# Patient Record
Sex: Male | Born: 1945 | Race: Asian | Hispanic: No | Smoking: Former smoker
Health system: Southern US, Community
[De-identification: ages and names within clinical notes are randomized; demographics above are authoritative.]

## PROBLEM LIST (undated history)

## (undated) DIAGNOSIS — I509 Heart failure, unspecified: Secondary | ICD-10-CM

## (undated) DIAGNOSIS — I10 Essential (primary) hypertension: Secondary | ICD-10-CM

## (undated) DIAGNOSIS — E039 Hypothyroidism, unspecified: Secondary | ICD-10-CM

## (undated) DIAGNOSIS — I5020 Unspecified systolic (congestive) heart failure: Secondary | ICD-10-CM

## (undated) DIAGNOSIS — E119 Type 2 diabetes mellitus without complications: Secondary | ICD-10-CM

## (undated) DIAGNOSIS — I4891 Unspecified atrial fibrillation: Secondary | ICD-10-CM

## (undated) DIAGNOSIS — K219 Gastro-esophageal reflux disease without esophagitis: Secondary | ICD-10-CM

## (undated) DIAGNOSIS — I2510 Atherosclerotic heart disease of native coronary artery without angina pectoris: Secondary | ICD-10-CM

## (undated) DIAGNOSIS — R0600 Dyspnea, unspecified: Secondary | ICD-10-CM

## (undated) DIAGNOSIS — E785 Hyperlipidemia, unspecified: Secondary | ICD-10-CM

## (undated) DIAGNOSIS — D509 Iron deficiency anemia, unspecified: Secondary | ICD-10-CM

## (undated) DIAGNOSIS — Z95810 Presence of automatic (implantable) cardiac defibrillator: Secondary | ICD-10-CM

## (undated) DIAGNOSIS — D696 Thrombocytopenia, unspecified: Secondary | ICD-10-CM

## (undated) DIAGNOSIS — N183 Chronic kidney disease, stage 3 unspecified: Secondary | ICD-10-CM

## (undated) DIAGNOSIS — I700 Atherosclerosis of aorta: Secondary | ICD-10-CM

## (undated) DIAGNOSIS — I255 Ischemic cardiomyopathy: Secondary | ICD-10-CM

## (undated) DIAGNOSIS — Z8673 Personal history of transient ischemic attack (TIA), and cerebral infarction without residual deficits: Secondary | ICD-10-CM

## (undated) DIAGNOSIS — Z7901 Long term (current) use of anticoagulants: Secondary | ICD-10-CM

## (undated) HISTORY — PX: CARDIAC DEFIBRILLATOR PLACEMENT: SHX171

## (undated) HISTORY — PX: CORONARY ARTERY BYPASS GRAFT: SHX141

## (undated) HISTORY — DX: Heart failure, unspecified: I50.9

---

## 2001-09-20 DIAGNOSIS — Z951 Presence of aortocoronary bypass graft: Secondary | ICD-10-CM

## 2001-09-20 DIAGNOSIS — Z8719 Personal history of other diseases of the digestive system: Secondary | ICD-10-CM

## 2001-09-20 HISTORY — PX: CORONARY ARTERY BYPASS GRAFT: SHX141

## 2001-09-20 HISTORY — DX: Presence of aortocoronary bypass graft: Z95.1

## 2002-09-20 DIAGNOSIS — I639 Cerebral infarction, unspecified: Secondary | ICD-10-CM

## 2002-09-20 HISTORY — DX: Cerebral infarction, unspecified: I63.9

## 2017-06-27 ENCOUNTER — Emergency Department: Payer: Medicare Other

## 2017-06-27 ENCOUNTER — Encounter: Payer: Self-pay | Admitting: *Deleted

## 2017-06-27 ENCOUNTER — Inpatient Hospital Stay
Admission: EM | Admit: 2017-06-27 | Discharge: 2017-07-06 | DRG: 308 | Disposition: A | Discharge status: Home Health | Payer: Medicare Other | Attending: Internal Medicine | Admitting: Internal Medicine

## 2017-06-27 DIAGNOSIS — F4321 Adjustment disorder with depressed mood: Secondary | ICD-10-CM

## 2017-06-27 DIAGNOSIS — I5021 Acute systolic (congestive) heart failure: Secondary | ICD-10-CM

## 2017-06-27 DIAGNOSIS — I5023 Acute on chronic systolic (congestive) heart failure: Secondary | ICD-10-CM | POA: Diagnosis present

## 2017-06-27 DIAGNOSIS — J9601 Acute respiratory failure with hypoxia: Secondary | ICD-10-CM | POA: Diagnosis not present

## 2017-06-27 DIAGNOSIS — F4320 Adjustment disorder, unspecified: Secondary | ICD-10-CM | POA: Diagnosis present

## 2017-06-27 DIAGNOSIS — Z8673 Personal history of transient ischemic attack (TIA), and cerebral infarction without residual deficits: Secondary | ICD-10-CM

## 2017-06-27 DIAGNOSIS — I110 Hypertensive heart disease with heart failure: Secondary | ICD-10-CM | POA: Diagnosis present

## 2017-06-27 DIAGNOSIS — R42 Dizziness and giddiness: Secondary | ICD-10-CM | POA: Diagnosis not present

## 2017-06-27 DIAGNOSIS — J9600 Acute respiratory failure, unspecified whether with hypoxia or hypercapnia: Secondary | ICD-10-CM

## 2017-06-27 DIAGNOSIS — R7989 Other specified abnormal findings of blood chemistry: Secondary | ICD-10-CM

## 2017-06-27 DIAGNOSIS — I10 Essential (primary) hypertension: Secondary | ICD-10-CM | POA: Diagnosis present

## 2017-06-27 DIAGNOSIS — I2510 Atherosclerotic heart disease of native coronary artery without angina pectoris: Secondary | ICD-10-CM | POA: Diagnosis present

## 2017-06-27 DIAGNOSIS — Z951 Presence of aortocoronary bypass graft: Secondary | ICD-10-CM

## 2017-06-27 DIAGNOSIS — Z452 Encounter for adjustment and management of vascular access device: Secondary | ICD-10-CM

## 2017-06-27 DIAGNOSIS — R05 Cough: Secondary | ICD-10-CM

## 2017-06-27 DIAGNOSIS — I4892 Unspecified atrial flutter: Secondary | ICD-10-CM | POA: Diagnosis present

## 2017-06-27 DIAGNOSIS — R945 Abnormal results of liver function studies: Secondary | ICD-10-CM | POA: Diagnosis present

## 2017-06-27 DIAGNOSIS — G9341 Metabolic encephalopathy: Secondary | ICD-10-CM | POA: Diagnosis not present

## 2017-06-27 DIAGNOSIS — I4891 Unspecified atrial fibrillation: Secondary | ICD-10-CM

## 2017-06-27 DIAGNOSIS — Z4659 Encounter for fitting and adjustment of other gastrointestinal appliance and device: Secondary | ICD-10-CM

## 2017-06-27 DIAGNOSIS — Z95810 Presence of automatic (implantable) cardiac defibrillator: Secondary | ICD-10-CM

## 2017-06-27 DIAGNOSIS — R443 Hallucinations, unspecified: Secondary | ICD-10-CM | POA: Diagnosis not present

## 2017-06-27 DIAGNOSIS — R059 Cough, unspecified: Secondary | ICD-10-CM

## 2017-06-27 DIAGNOSIS — R451 Restlessness and agitation: Secondary | ICD-10-CM | POA: Diagnosis not present

## 2017-06-27 DIAGNOSIS — I481 Persistent atrial fibrillation: Secondary | ICD-10-CM | POA: Diagnosis not present

## 2017-06-27 DIAGNOSIS — I480 Paroxysmal atrial fibrillation: Secondary | ICD-10-CM | POA: Diagnosis present

## 2017-06-27 DIAGNOSIS — Z7982 Long term (current) use of aspirin: Secondary | ICD-10-CM

## 2017-06-27 DIAGNOSIS — H539 Unspecified visual disturbance: Secondary | ICD-10-CM | POA: Diagnosis not present

## 2017-06-27 DIAGNOSIS — I255 Ischemic cardiomyopathy: Secondary | ICD-10-CM | POA: Diagnosis present

## 2017-06-27 DIAGNOSIS — R0602 Shortness of breath: Secondary | ICD-10-CM

## 2017-06-27 DIAGNOSIS — Z79899 Other long term (current) drug therapy: Secondary | ICD-10-CM

## 2017-06-27 DIAGNOSIS — E119 Type 2 diabetes mellitus without complications: Secondary | ICD-10-CM | POA: Diagnosis present

## 2017-06-27 DIAGNOSIS — I252 Old myocardial infarction: Secondary | ICD-10-CM

## 2017-06-27 DIAGNOSIS — Z7984 Long term (current) use of oral hypoglycemic drugs: Secondary | ICD-10-CM

## 2017-06-27 HISTORY — DX: Personal history of transient ischemic attack (TIA), and cerebral infarction without residual deficits: Z86.73

## 2017-06-27 HISTORY — DX: Essential (primary) hypertension: I10

## 2017-06-27 HISTORY — DX: Atherosclerotic heart disease of native coronary artery without angina pectoris: I25.10

## 2017-06-27 HISTORY — DX: Type 2 diabetes mellitus without complications: E11.9

## 2017-06-27 LAB — BASIC METABOLIC PANEL
Anion gap: 9 (ref 5–15)
BUN: 20 mg/dL (ref 6–20)
CALCIUM: 9.2 mg/dL (ref 8.9–10.3)
CHLORIDE: 103 mmol/L (ref 101–111)
CO2: 27 mmol/L (ref 22–32)
CREATININE: 1.11 mg/dL (ref 0.61–1.24)
GFR calc Af Amer: >60 mL/min (ref >60)
GFR calc non Af Amer: >60 mL/min (ref >60)
GLUCOSE: 213 mg/dL — AB (ref 65–99)
Potassium: 4.5 mmol/L (ref 3.5–5.1)
Sodium: 139 mmol/L (ref 135–145)

## 2017-06-27 LAB — CBC
HCT: 35.2 % — ABNORMAL LOW (ref 40.0–52.0)
Hemoglobin: 11.7 g/dL — ABNORMAL LOW (ref 13.0–18.0)
MCH: 28.4 pg (ref 26.0–34.0)
MCHC: 33.3 g/dL (ref 32.0–36.0)
MCV: 85.4 fL (ref 80.0–100.0)
PLATELETS: 142 10*3/uL — AB (ref 150–440)
RBC: 4.12 MIL/uL — ABNORMAL LOW (ref 4.40–5.90)
RDW: 15.3 % — ABNORMAL HIGH (ref 11.5–14.5)
WBC: 5.3 10*3/uL (ref 3.8–10.6)

## 2017-06-27 LAB — TROPONIN I

## 2017-06-27 MED ORDER — METOPROLOL TARTRATE 5 MG/5ML IV SOLN
2.5000 mg | Freq: Once | INTRAVENOUS | Status: AC
  Administered 2017-06-27: 2.5 mg via INTRAVENOUS
  Filled 2017-06-27: qty 5

## 2017-06-27 NOTE — ED Triage Notes (Signed)
Pt to triage via wheelchair.  Pt reports dizziness for 1 day.  No chest pain or sob.  No n//vd   No headache. Pt alert.  Speech clear.

## 2017-06-27 NOTE — ED Provider Notes (Signed)
[facility] Emergency Department Provider Note   ____________________________________________   First MD Initiated Contact with Patient 06/27/17 2306     (approximate)  I have reviewed the triage vital signs and the nursing notes.   HISTORY  Chief Complaint Dizziness    HPI Walter Blair is a 71 y.o. male who presents to the ED from home with a chief complaint of dizziness. Patient has a history of CAD status post CABG, diabetes who has been dizzy since around dinner time. Unable to describe whether it is a sensation of lightheadedness versus vertigo. Denies associated fever, chills, chest pain, shortness of breath, abdominal pain, nausea, vomiting. Denies recent travel or trauma. Takes Coreg and states it was decreased by half dosage in July secondary to patient experiencing dizziness.Nothing makes his dizziness better or worse.   Past medical history CAD Diabetes Hypertension Hyperlipidemia  There are no active problems to display for this patient.   Past surgical history AICD  Prior to Admission medications   Medication Sig Start Date End Date Taking? Authorizing Provider  atorvastatin (LIPITOR) 80 MG tablet Take 80 mg by mouth daily.   Yes [provider]  carvedilol (COREG) 3.125 MG tablet Take 3.125 mg by mouth 2 (two) times daily.   Yes [provider]  folic acid (FOLVITE) 1 MG tablet Take 1 mg by mouth every morning.   Yes [provider]  furosemide (LASIX) 20 MG tablet Take 20 mg by mouth daily.   Yes [provider]  lisinopril (PRINIVIL,ZESTRIL) 5 MG tablet Take 5 mg by mouth every morning.   Yes [provider]  metFORMIN (GLUCOPHAGE-XR) 500 MG 24 hr tablet Take 500-1,000 mg by mouth 2 (two) times daily. 500mg  in the morning and 1000mg  at night   Yes [provider]  RA ASPIRIN EC ADULT LOW ST 81 MG EC tablet Take 81 mg by mouth daily.   Yes [provider]     Allergies Patient has no known allergies.  No family history on file.  Social History Social History  Substance Use Topics  . Smoking status: Never Smoker  . Smokeless tobacco: Never Used  . Alcohol use No    Review of Systems  Constitutional: No fever/chills. Eyes: No visual changes. ENT: No sore throat. Cardiovascular: Denies chest pain. Respiratory: Denies shortness of breath. Gastrointestinal: No abdominal pain.  No nausea, no vomiting.  No diarrhea.  No constipation. Genitourinary: Negative for dysuria. Musculoskeletal: Negative for back pain. Skin: Negative for rash. Neurological: Positive for dizziness. Negative for headaches, focal weakness or numbness.   ____________________________________________   PHYSICAL EXAM:  VITAL SIGNS: ED Triage Vitals [06/27/17 2149]  Enc Vitals Group     BP 125/74     Pulse Rate 96     Resp 18     Temp 98.2 F (36.8 C)     Temp Source Oral     SpO2 96 %     Weight 125 lb (56.7 kg)     Height 5\' 5"  (1.651 m)     Head Circumference      Peak Flow      Pain Score      Pain Loc      Pain Edu?      Excl. in [place]?     Constitutional: Alert and oriented. Well appearing and in no acute distress. Eyes: Conjunctivae are normal. PERRL. EOMI. Head: Atraumatic. Nose: No congestion/rhinnorhea. Mouth/Throat: Mucous membranes are moist.  Oropharynx non-erythematous. Neck: No stridor.  No carotid bruits. Cardiovascular: Tachycardic rate, irregular rhythm. Grossly normal heart sounds.  Good peripheral circulation. Respiratory: Normal respiratory effort.  No retractions. Lungs CTAB. Gastrointestinal: Soft and nontender. No distention. No abdominal bruits. No CVA tenderness. Musculoskeletal: No lower extremity tenderness nor edema.  No joint effusions. Neurologic:  Normal speech and language. No gross focal neurologic deficits are appreciated.  Skin:  Skin is warm, dry and intact. No rash noted. Psychiatric: Mood and affect are  normal. Speech and behavior are normal.  ____________________________________________   LABS (all labs ordered are listed, but only abnormal results are displayed)  Labs Reviewed  BASIC METABOLIC PANEL - Abnormal; Notable for the following:       Result Value   Glucose, Bld 213 (*)    All other components within normal limits  CBC - Abnormal; Notable for the following:    RBC 4.12 (*)    Hemoglobin 11.7 (*)    HCT 35.2 (*)    RDW 15.3 (*)    Platelets 142 (*)    All other components within normal limits  TROPONIN I   ____________________________________________  EKG  ED ECG REPORT I, SUNG,JADE J, the attending physician, personally viewed and interpreted this ECG.   Date: 06/27/2017  EKG Time: 2156  Rate: 112  Rhythm: atrial fibrillation, rate 112  Axis: RAD  Intervals:none  ST&T Change: Nonspecific  ____________________________________________  RADIOLOGY  Dg Chest 2 View  Result Date: 06/27/2017 CLINICAL DATA:  Dizziness for 1 day. EXAM: CHEST  2 VIEW COMPARISON:  None. FINDINGS: Heart size is mildly enlarged. Evidence of prior CABG procedure. There is a left cardiac ICD with a lead in the right ventricle. The lungs are clear without airspace disease or pulmonary edema. No large pleural effusions. Bony thorax is intact. IMPRESSION: No active cardiopulmonary disease. Electronically Signed   By: Markus Daft M.D.   On: 06/27/2017 22:16    ____________________________________________   PROCEDURES  Procedure(s) performed: None  Procedures  Critical Care performed: No  ____________________________________________   INITIAL IMPRESSION / ASSESSMENT AND PLAN / ED COURSE  As part of my medical decision making, I reviewed the following data within the [place] History obtained from family, Nursing notes reviewed and incorporated, Labs reviewed, EKG interpreted, Radiograph reviewed, Discussed with admitting physician, Evaluated by EM attending and  Notes from prior ED visits.   71 year old male with CAD, diabetes, hypertension, hyperlipidemia who presents with dizziness. Differential diagnoses includes but is not limited to neurological, infectious, cardiac etiologies. Presents in atrial fibrillation with rapid ventricular rate, no prior history of atrial fibrillation. Initial troponin is unremarkable; will administer low-dose Lopressor, check orthostatics, obtain CT head to evaluate for intracranial abnormalities. Discussed with hospitalist evaluate patient in the emergency department for admission.      ____________________________________________   FINAL CLINICAL IMPRESSION(S) / ED DIAGNOSES  Final diagnoses:  Dizziness  New onset atrial fibrillation (HCC)      NEW MEDICATIONS STARTED DURING THIS VISIT:  New Prescriptions   No medications on file     Note:  This document was prepared using Dragon voice recognition software and may include unintentional dictation errors.    Paulette Blanch, MD 06/28/17 [phone]

## 2017-06-27 NOTE — H&P (Signed)
[facility] NAME: Walter Blair    MR#:  [id number]  DATE OF BIRTH:  18-May-1946  DATE OF ADMISSION:  06/27/2017  PRIMARY CARE PHYSICIAN: Jodi Marble, MD   REQUESTING/REFERRING PHYSICIAN: Beather Arbour, MD  CHIEF COMPLAINT:   Chief Complaint  Patient presents with  . Dizziness    HISTORY OF PRESENT ILLNESS:  Walter Blair  is a 71 y.o. male who presents with Recurring episodes of dizziness today. Patient states that he would get dizzy when he would stand up. This happened several times and so he came to the ED for evaluation. Here he was noted to be in A. fib with RVR. Patient states he has never before been diagnosed with any arrhythmia. Hospitalists were called for admission.  PAST MEDICAL HISTORY:   Past Medical History:  Diagnosis Date  . CAD (coronary artery disease)   . Diabetes ([place])   . H/O: stroke   . HTN (hypertension)     PAST SURGICAL HISTORY:   Past Surgical History:  Procedure Laterality Date  . CARDIAC DEFIBRILLATOR PLACEMENT    . CORONARY ARTERY BYPASS GRAFT      SOCIAL HISTORY:   Social History  Substance Use Topics  . Smoking status: Never Smoker  . Smokeless tobacco: Never Used  . Alcohol use No    FAMILY HISTORY:   Family History  Problem Relation Age of Onset  . Diabetes Brother   . Hypertension Mother   . Diabetes Sister     DRUG ALLERGIES:  No Known Allergies  MEDICATIONS AT HOME:   Prior to Admission medications   Medication Sig Start Date End Date Taking? Authorizing Provider  atorvastatin (LIPITOR) 80 MG tablet Take 80 mg by mouth daily.   Yes [provider]  carvedilol (COREG) 3.125 MG tablet Take 3.125 mg by mouth 2 (two) times daily.   Yes [provider]  folic acid (FOLVITE) 1 MG tablet Take 1 mg by mouth every morning.   Yes [provider]  furosemide (LASIX) 20 MG tablet Take 20 mg by mouth daily.   Yes [provider]   lisinopril (PRINIVIL,ZESTRIL) 5 MG tablet Take 5 mg by mouth every morning.   Yes [provider]  metFORMIN (GLUCOPHAGE-XR) 500 MG 24 hr tablet Take 500-1,000 mg by mouth 2 (two) times daily. 500mg  in the morning and 1000mg  at night   Yes [provider]  RA ASPIRIN EC ADULT LOW ST 81 MG EC tablet Take 81 mg by mouth daily.   Yes [provider]    REVIEW OF SYSTEMS:  Review of Systems  Constitutional: Negative for chills, fever, malaise/fatigue and weight loss.  HENT: Negative for ear pain, hearing loss and tinnitus.   Eyes: Negative for blurred vision, double vision, pain and redness.  Respiratory: Negative for cough, hemoptysis and shortness of breath.   Cardiovascular: Negative for chest pain, palpitations, orthopnea and leg swelling.  Gastrointestinal: Negative for abdominal pain, constipation, diarrhea, nausea and vomiting.  Genitourinary: Negative for dysuria, frequency and hematuria.  Musculoskeletal: Negative for back pain, joint pain and neck pain.  Skin:       No acne, rash, or lesions  Neurological: Positive for dizziness. Negative for tremors, focal weakness and weakness.  Endo/Heme/Allergies: Negative for polydipsia. Does not bruise/bleed easily.  Psychiatric/Behavioral: Negative for depression. The patient is not nervous/anxious and does not have insomnia.      VITAL SIGNS:   Vitals:   06/27/17 2149 06/27/17  2350  BP: 125/74 (!) 140/95  Pulse: 96 (!) 107  Resp: 18 15  Temp: 98.2 F (36.8 C)   TempSrc: Oral   SpO2: 96% 96%  Weight: 56.7 kg (125 lb)   Height: 5\' 5"  (1.651 m)    Wt Readings from Last 3 Encounters:  06/27/17 56.7 kg (125 lb)    PHYSICAL EXAMINATION:  Physical Exam  Vitals reviewed. Constitutional: He is oriented to person, place, and time. He appears well-developed and well-nourished. No distress.  HENT:  Head: Normocephalic and atraumatic.  Mouth/Throat: Oropharynx is clear and moist.  Eyes: Pupils are equal,  round, and reactive to light. Conjunctivae and EOM are normal. No scleral icterus.  Neck: Normal range of motion. Neck supple. No JVD present. No thyromegaly present.  Cardiovascular: Intact distal pulses.  Exam reveals no gallop and no friction rub.   No murmur heard. Irregular rhythm, tachycardic  Respiratory: Effort normal and breath sounds normal. No respiratory distress. He has no wheezes. He has no rales.  GI: Soft. Bowel sounds are normal. He exhibits no distension. There is no tenderness.  Musculoskeletal: Normal range of motion. He exhibits no edema.  No arthritis, no gout  Lymphadenopathy:    He has no cervical adenopathy.  Neurological: He is alert and oriented to person, place, and time. No cranial nerve deficit.  No dysarthria, no aphasia  Skin: Skin is warm and dry. No rash noted. No erythema.  Psychiatric: He has a normal mood and affect. His behavior is normal. Judgment and thought content normal.    LABORATORY PANEL:   CBC  Recent Labs Lab 06/27/17 2152  WBC 5.3  HGB 11.7*  HCT 35.2*  PLT 142*   ------------------------------------------------------------------------------------------------------------------  Chemistries   Recent Labs Lab 06/27/17 2152  NA 139  K 4.5  CL 103  CO2 27  GLUCOSE 213*  BUN 20  CREATININE 1.11  CALCIUM 9.2   ------------------------------------------------------------------------------------------------------------------  Cardiac Enzymes  Recent Labs Lab 06/27/17 2152  TROPONINI <0.03   ------------------------------------------------------------------------------------------------------------------  RADIOLOGY:  Dg Chest 2 View  Result Date: 06/27/2017 CLINICAL DATA:  Dizziness for 1 day. EXAM: CHEST  2 VIEW COMPARISON:  None. FINDINGS: Heart size is mildly enlarged. Evidence of prior CABG procedure. There is a left cardiac ICD with a lead in the right ventricle. The lungs are clear without airspace disease or  pulmonary edema. No large pleural effusions. Bony thorax is intact. IMPRESSION: No active cardiopulmonary disease. Electronically Signed   By: Markus Daft M.D.   On: 06/27/2017 22:16    EKG:   Orders placed or performed during the hospital encounter of 06/27/17  . ED EKG within 10 minutes  . ED EKG within 10 minutes    IMPRESSION AND PLAN:  Principal Problem:   Atrial fibrillation with RVR (HCC) - IV Lopressor for rate control, echocardiogram and cardiology consult Active Problems:   Diabetes ([place]) - sliding scale insulin with corresponding glucose checks   HTN (hypertension) - continue home meds   CAD (coronary artery disease) - continue home medications  All the records are reviewed and case discussed with ED provider. Management plans discussed with the patient and/or family.  DVT PROPHYLAXIS: SubQ lovenox  GI PROPHYLAXIS: None  ADMISSION STATUS: Inpatient  CODE STATUS: Full Code Status History    This patient does not have a recorded code status. Please follow your organizational policy for patients in this situation.      TOTAL TIME TAKING CARE OF THIS PATIENT: 40 minutes.   Juniel Groene  FIELDING 06/27/2017, 11:57 PM  [facility]  Office  [phone]  CC: Primary care physician; Jodi Marble, MD  Note:  This document was prepared using Dragon voice recognition software and may include unintentional dictation errors.

## 2017-06-28 ENCOUNTER — Observation Stay: Payer: Medicare Other

## 2017-06-28 ENCOUNTER — Inpatient Hospital Stay: Admit: 2017-06-28 | Payer: Medicare Other

## 2017-06-28 DIAGNOSIS — I255 Ischemic cardiomyopathy: Secondary | ICD-10-CM | POA: Diagnosis present

## 2017-06-28 DIAGNOSIS — G9341 Metabolic encephalopathy: Secondary | ICD-10-CM | POA: Diagnosis not present

## 2017-06-28 DIAGNOSIS — I481 Persistent atrial fibrillation: Secondary | ICD-10-CM | POA: Diagnosis present

## 2017-06-28 DIAGNOSIS — Z452 Encounter for adjustment and management of vascular access device: Secondary | ICD-10-CM | POA: Diagnosis not present

## 2017-06-28 DIAGNOSIS — I5023 Acute on chronic systolic (congestive) heart failure: Secondary | ICD-10-CM | POA: Diagnosis present

## 2017-06-28 DIAGNOSIS — I110 Hypertensive heart disease with heart failure: Secondary | ICD-10-CM | POA: Diagnosis present

## 2017-06-28 DIAGNOSIS — I4891 Unspecified atrial fibrillation: Secondary | ICD-10-CM | POA: Diagnosis not present

## 2017-06-28 DIAGNOSIS — F4321 Adjustment disorder with depressed mood: Secondary | ICD-10-CM | POA: Diagnosis not present

## 2017-06-28 DIAGNOSIS — I252 Old myocardial infarction: Secondary | ICD-10-CM | POA: Diagnosis not present

## 2017-06-28 DIAGNOSIS — Z7984 Long term (current) use of oral hypoglycemic drugs: Secondary | ICD-10-CM | POA: Diagnosis not present

## 2017-06-28 DIAGNOSIS — Z951 Presence of aortocoronary bypass graft: Secondary | ICD-10-CM | POA: Diagnosis not present

## 2017-06-28 DIAGNOSIS — R443 Hallucinations, unspecified: Secondary | ICD-10-CM | POA: Diagnosis not present

## 2017-06-28 DIAGNOSIS — R42 Dizziness and giddiness: Secondary | ICD-10-CM | POA: Diagnosis present

## 2017-06-28 DIAGNOSIS — Z79899 Other long term (current) drug therapy: Secondary | ICD-10-CM | POA: Diagnosis not present

## 2017-06-28 DIAGNOSIS — E119 Type 2 diabetes mellitus without complications: Secondary | ICD-10-CM | POA: Diagnosis present

## 2017-06-28 DIAGNOSIS — Z8673 Personal history of transient ischemic attack (TIA), and cerebral infarction without residual deficits: Secondary | ICD-10-CM | POA: Diagnosis not present

## 2017-06-28 DIAGNOSIS — F4320 Adjustment disorder, unspecified: Secondary | ICD-10-CM | POA: Diagnosis present

## 2017-06-28 DIAGNOSIS — R945 Abnormal results of liver function studies: Secondary | ICD-10-CM | POA: Diagnosis present

## 2017-06-28 DIAGNOSIS — J9601 Acute respiratory failure with hypoxia: Secondary | ICD-10-CM | POA: Diagnosis not present

## 2017-06-28 DIAGNOSIS — I4892 Unspecified atrial flutter: Secondary | ICD-10-CM | POA: Diagnosis present

## 2017-06-28 DIAGNOSIS — R0602 Shortness of breath: Secondary | ICD-10-CM | POA: Diagnosis not present

## 2017-06-28 DIAGNOSIS — Z7982 Long term (current) use of aspirin: Secondary | ICD-10-CM | POA: Diagnosis not present

## 2017-06-28 DIAGNOSIS — H539 Unspecified visual disturbance: Secondary | ICD-10-CM | POA: Diagnosis not present

## 2017-06-28 DIAGNOSIS — I2510 Atherosclerotic heart disease of native coronary artery without angina pectoris: Secondary | ICD-10-CM | POA: Diagnosis present

## 2017-06-28 DIAGNOSIS — Z95810 Presence of automatic (implantable) cardiac defibrillator: Secondary | ICD-10-CM | POA: Diagnosis not present

## 2017-06-28 DIAGNOSIS — R451 Restlessness and agitation: Secondary | ICD-10-CM | POA: Diagnosis not present

## 2017-06-28 LAB — BASIC METABOLIC PANEL
Anion gap: 5 (ref 5–15)
BUN: 17 mg/dL (ref 6–20)
CALCIUM: 8.5 mg/dL — AB (ref 8.9–10.3)
CO2: 28 mmol/L (ref 22–32)
CREATININE: 1.06 mg/dL (ref 0.61–1.24)
Chloride: 108 mmol/L (ref 101–111)
GFR calc non Af Amer: >60 mL/min (ref >60)
GLUCOSE: 119 mg/dL — AB (ref 65–99)
Potassium: 3.5 mmol/L (ref 3.5–5.1)
Sodium: 141 mmol/L (ref 135–145)

## 2017-06-28 LAB — CBC
HEMATOCRIT: 32.4 % — AB (ref 40.0–52.0)
Hemoglobin: 11 g/dL — ABNORMAL LOW (ref 13.0–18.0)
MCH: 29.1 pg (ref 26.0–34.0)
MCHC: 34.1 g/dL (ref 32.0–36.0)
MCV: 85.4 fL (ref 80.0–100.0)
Platelets: 130 10*3/uL — ABNORMAL LOW (ref 150–440)
RBC: 3.8 MIL/uL — ABNORMAL LOW (ref 4.40–5.90)
RDW: 15.3 % — AB (ref 11.5–14.5)
WBC: 4.8 10*3/uL (ref 3.8–10.6)

## 2017-06-28 LAB — GLUCOSE, CAPILLARY
GLUCOSE-CAPILLARY: 128 mg/dL — AB (ref 65–99)
Glucose-Capillary: 152 mg/dL — ABNORMAL HIGH (ref 65–99)

## 2017-06-28 MED ORDER — APIXABAN 5 MG PO TABS
5.0000 mg | ORAL_TABLET | Freq: Two times a day (BID) | ORAL | Status: DC
  Administered 2017-06-28 – 2017-06-29 (×4): 5 mg via ORAL
  Filled 2017-06-28 (×4): qty 1

## 2017-06-28 MED ORDER — METOPROLOL TARTRATE 5 MG/5ML IV SOLN
2.5000 mg | Freq: Once | INTRAVENOUS | Status: AC
  Administered 2017-06-28: 2.5 mg via INTRAVENOUS
  Filled 2017-06-28: qty 5

## 2017-06-28 MED ORDER — SODIUM CHLORIDE 0.9 % IV SOLN
INTRAVENOUS | Status: DC
  Administered 2017-06-28: 02:00:00 via INTRAVENOUS

## 2017-06-28 MED ORDER — ENOXAPARIN SODIUM 40 MG/0.4ML ~~LOC~~ SOLN
40.0000 mg | SUBCUTANEOUS | Status: DC
  Administered 2017-06-28: 40 mg via SUBCUTANEOUS
  Filled 2017-06-28: qty 0.4

## 2017-06-28 MED ORDER — INSULIN ASPART 100 UNIT/ML ~~LOC~~ SOLN
0.0000 [IU] | Freq: Three times a day (TID) | SUBCUTANEOUS | Status: DC
Start: 2017-06-28 — End: 2017-06-30
  Administered 2017-06-28: 2 [IU] via SUBCUTANEOUS
  Administered 2017-06-30: 1 [IU] via SUBCUTANEOUS
  Filled 2017-06-28 (×3): qty 1

## 2017-06-28 MED ORDER — AMIODARONE IV BOLUS ONLY 150 MG/100ML
150.0000 mg | Freq: Once | INTRAVENOUS | Status: DC
  Filled 2017-06-28: qty 100

## 2017-06-28 MED ORDER — LISINOPRIL 5 MG PO TABS
5.0000 mg | ORAL_TABLET | ORAL | Status: DC
  Administered 2017-06-29: 5 mg via ORAL
  Filled 2017-06-28 (×2): qty 1

## 2017-06-28 MED ORDER — ATORVASTATIN CALCIUM 20 MG PO TABS
80.0000 mg | ORAL_TABLET | Freq: Every day | ORAL | Status: DC
  Administered 2017-06-28 – 2017-07-02 (×4): 80 mg via ORAL
  Filled 2017-06-28 (×5): qty 4

## 2017-06-28 MED ORDER — FOLIC ACID 1 MG PO TABS
1.0000 mg | ORAL_TABLET | Freq: Every day | ORAL | Status: DC
  Administered 2017-06-28 – 2017-07-01 (×4): 1 mg via ORAL
  Filled 2017-06-28 (×4): qty 1

## 2017-06-28 MED ORDER — AMIODARONE HCL IN DEXTROSE 360-4.14 MG/200ML-% IV SOLN
60.0000 mg/h | INTRAVENOUS | Status: AC
  Administered 2017-06-28 (×2): 60 mg/h via INTRAVENOUS
  Filled 2017-06-28 (×2): qty 200

## 2017-06-28 MED ORDER — ONDANSETRON HCL 4 MG/2ML IJ SOLN: 4.0000 mg | Freq: Four times a day (QID) | INTRAMUSCULAR | Status: DC | PRN

## 2017-06-28 MED ORDER — CARVEDILOL 3.125 MG PO TABS
3.1250 mg | ORAL_TABLET | Freq: Two times a day (BID) | ORAL | Status: DC
  Administered 2017-06-28 – 2017-06-29 (×3): 3.125 mg via ORAL
  Filled 2017-06-28 (×4): qty 1

## 2017-06-28 MED ORDER — ACETAMINOPHEN 325 MG PO TABS: 650.0000 mg | ORAL_TABLET | Freq: Four times a day (QID) | ORAL | Status: DC | PRN

## 2017-06-28 MED ORDER — FUROSEMIDE 20 MG PO TABS
20.0000 mg | ORAL_TABLET | Freq: Every day | ORAL | Status: DC
  Administered 2017-06-28 – 2017-06-29 (×2): 20 mg via ORAL
  Filled 2017-06-28 (×2): qty 1

## 2017-06-28 MED ORDER — ACETAMINOPHEN 650 MG RE SUPP: 650.0000 mg | Freq: Four times a day (QID) | RECTAL | Status: DC | PRN

## 2017-06-28 MED ORDER — AMIODARONE HCL IN DEXTROSE 360-4.14 MG/200ML-% IV SOLN
30.0000 mg/h | INTRAVENOUS | Status: DC
  Administered 2017-06-28 – 2017-06-30 (×5): 30 mg/h via INTRAVENOUS
  Filled 2017-06-28 (×4): qty 200

## 2017-06-28 MED ORDER — AMIODARONE LOAD VIA INFUSION
150.0000 mg | Freq: Once | INTRAVENOUS | Status: AC
  Administered 2017-06-28: 150 mg via INTRAVENOUS
  Filled 2017-06-28: qty 83.34

## 2017-06-28 MED ORDER — ONDANSETRON HCL 4 MG PO TABS: 4.0000 mg | ORAL_TABLET | Freq: Four times a day (QID) | ORAL | Status: DC | PRN

## 2017-06-28 NOTE — Care Management (Signed)
Patient placed in observation for new onset atrial fib with RVR and received on e dose IV Lopressor.  Was started on Amiodarone drip this morning.  Notified UR to reconsider observation status

## 2017-06-28 NOTE — Progress Notes (Signed)
Spoke with dr. Verdell Carmine regarding scheduled lisinopril and coreg. sbp 125. Per md okay to give coreg hold lisinopril, patient to be started on amiodarone drip. Will continue to monitor

## 2017-06-28 NOTE — Progress Notes (Signed)
  Amiodarone Drug - Drug Interaction Consult Note  Recommendations:  Amiodarone is metabolized by the cytochrome P450 system and therefore has the potential to cause many drug interactions. Amiodarone has an average plasma half-life of 50 days (range 20 to 100 days).   There is potential for drug interactions to occur several weeks or months after stopping treatment and the onset of drug interactions may be slow after initiating amiodarone.   [x]  Statins: Increased risk of myopathy. Simvastatin- restrict dose to 20mg  daily. Other statins (Atorvastatin) : Counsel patients to report any muscle pain or weakness immediately.  [x]  Beta blockers (Coreg-carvedilol) : increased risk of bradycardia, AV block and myocardial depression.      [x]  Drugs that prolong the QT interval:  Torsades de pointes risk may be increased with concurrent use - avoid if possible.  Monitor QTc, also keep magnesium/potassium WNL if concurrent therapy can't be avoided. Marland Kitchen Antibiotics: e.g. fluoroquinolones, erythromycin. . Antiarrhythmics: e.g. quinidine, procainamide, disopyramide, sotalol. . Antipsychotics: e.g. phenothiazines, haloperidol.  . Lithium, tricyclic antidepressants, and methadone.  Thank You,  Pernell Dupre, PharmD, BCPS Clinical Pharmacist 06/28/2017 8:38 AM

## 2017-06-28 NOTE — Progress Notes (Signed)
[facility] [place] [facility] [place] NAME: Walter Blair    MR#:  [id number]  DATE OF BIRTH:  [birth year]  SUBJECTIVE:   Pt. Here due to dizziness and noted to be in a. Fib w/ RVR.  Patient remains in atrial fibrillation with RVR and seen by cardiology and started on a Amiodarone drip. He denies any chest pains, shortness of breath.  REVIEW OF SYSTEMS:    Review of Systems  Constitutional: Negative for chills and fever.  HENT: Negative for congestion and tinnitus.   Eyes: Negative for blurred vision and double vision.  Respiratory: Negative for cough, shortness of breath and wheezing.   Cardiovascular: Negative for chest pain, orthopnea and PND.  Gastrointestinal: Negative for abdominal pain, diarrhea, nausea and vomiting.  Genitourinary: Negative for dysuria and hematuria.  Neurological: Positive for dizziness. Negative for sensory change and focal weakness.  All other systems reviewed and are negative.   Nutrition: Heart Healthy/Carb control Tolerating Diet: Yes Tolerating PT: Ambulatory  DRUG ALLERGIES:  No Known Allergies  VITALS:  Blood pressure 100/68, pulse 70, temperature (!) 97.4 F (36.3 C), temperature source Oral, resp. rate 19, height 5\' 5"  (1.651 m), weight 59.4 kg (131 lb), SpO2 97 %.  PHYSICAL EXAMINATION:   Physical Exam  GENERAL:  71 y.o.-year-old patient lying in bed in no acute distress.  EYES: Pupils equal, round, reactive to light and accommodation. No scleral icterus. Extraocular muscles intact.  HEENT: Head atraumatic, normocephalic. Oropharynx and nasopharynx clear.  NECK:  Supple, no jugular venous distention. No thyroid enlargement, no tenderness.  LUNGS: Normal breath sounds bilaterally, no wheezing, rales, rhonchi. No use of accessory muscles of respiration.  CARDIOVASCULAR: S1, S2 Irregular rate. No murmurs, rubs, or gallops.  ABDOMEN: Soft, nontender, nondistended. Bowel sounds present. No organomegaly or mass.   EXTREMITIES: No cyanosis, clubbing or edema b/l.    NEUROLOGIC: Cranial nerves II through XII are intact. No focal Motor or sensory deficits b/l.   PSYCHIATRIC: The patient is alert and oriented x 3.  SKIN: No obvious rash, lesion, or ulcer.    LABORATORY PANEL:   CBC  Recent Labs Lab 06/28/17 0435  WBC 4.8  HGB 11.0*  HCT 32.4*  PLT 130*   ------------------------------------------------------------------------------------------------------------------  Chemistries   Recent Labs Lab 06/28/17 0435  NA 141  K 3.5  CL 108  CO2 28  GLUCOSE 119*  BUN 17  CREATININE 1.06  CALCIUM 8.5*   ------------------------------------------------------------------------------------------------------------------  Cardiac Enzymes  Recent Labs Lab 06/27/17 2152  TROPONINI <0.03   ------------------------------------------------------------------------------------------------------------------  RADIOLOGY:  Dg Chest 2 View  Result Date: 06/27/2017 CLINICAL DATA:  Dizziness for 1 day. EXAM: CHEST  2 VIEW COMPARISON:  None. FINDINGS: Heart size is mildly enlarged. Evidence of prior CABG procedure. There is a left cardiac ICD with a lead in the right ventricle. The lungs are clear without airspace disease or pulmonary edema. No large pleural effusions. Bony thorax is intact. IMPRESSION: No active cardiopulmonary disease. Electronically Signed   By: Markus Daft M.D.   On: 06/27/2017 22:16   Ct Head Wo Contrast  Result Date: 06/28/2017 CLINICAL DATA:  Dizziness for 1 day, history MI, stroke, hypertension, diabetes mellitus EXAM: CT HEAD WITHOUT CONTRAST TECHNIQUE: Contiguous axial images were obtained from the base of the skull through the vertex without intravenous contrast. Sagittal and coronal MPR images reconstructed from axial data set. COMPARISON:  None FINDINGS: Brain: Generalized atrophy. Normal ventricular morphology. No midline shift or mass effect. Minimal small vessel chronic  ischemic changes of deep cerebral white matter. Old RIGHT frontoparietal MCA territory infarct. No intracranial hemorrhage, mass lesion, or evidence acute infarction. No extra-axial fluid collections. Vascular: Atherosclerotic calcification of internal carotid arteries bilaterally at skullbase Skull: Intact Sinuses/Orbits: Clear Other: N/A IMPRESSION: Atrophy with minimal small vessel chronic ischemic changes of deep cerebral white matter. Old RIGHT MCA territory infarct. No acute intracranial abnormalities. Electronically Signed   By: Lavonia Dana M.D.   On: 06/28/2017 00:53     ASSESSMENT AND PLAN:   71 year old male with past medical history of coronary artery disease status post bypass, diabetes, hypertension, history of previous CVA who presented to the hospital due to dizziness and noted to be in acute atrial fibrillation with rapid ventricular response.   1. Atrial fibrillation with rapid ventricular response-this is the cause of patient's dizziness. -Patient's heart rates are still somewhat labile. Seen by cardiology and started on a amiodarone drip. -Continue carvedilol, also started on Eliquis today. If patient does not convert or slow down the next 24 hours consider cardioversion as per cardiology.  2. Essential hypertension-continue carvedilol, lisinopril.  3. Diabetes type 2 without complication-continue sliding scale insulin. Hold metformin.  4. History of coronary artery disease-no acute chest pain presently. Continue carvedilol, atorvastatin, lisinopril.  Discussed plan of care with the patient and also with the cardiologist over the phone.   All the records are reviewed and case discussed with Care Management/Social Worker. Management plans discussed with the patient, family and they are in agreement.  CODE STATUS: Full code  DVT Prophylaxis: Eliquis  TOTAL TIME TAKING CARE OF THIS PATIENT: 30 minutes.   POSSIBLE D/C IN 1-2 DAYS, DEPENDING ON CLINICAL  CONDITION.   Henreitta Leber M.D on 06/28/2017 at 3:46 PM  Between 7am to 6pm - Pager - [phone]  After 6pm go to [email or web address] - Proofreader  [facility]  Office  [phone]  CC: Primary care physician; Jodi Marble, MD

## 2017-06-28 NOTE — Care Management Obs Status (Signed)
[place] NOTIFICATION   Patient Details  Name: Walter Blair MRN: [id number] Date of Birth: 1946/08/25   Medicare Observation Status Notification Given:  No converted to inpatient < 24 hour of being placed in observatoin  Katrina Stack, RN 06/28/2017, 3:16 PM

## 2017-06-28 NOTE — Consult Note (Signed)
ANTICOAGULATION CONSULT NOTE - Initial Consult  Pharmacy Consult for apixaban Indication: atrial fibrillation  No Known Allergies  Patient Measurements: Height: 5\' 5"  (165.1 cm) Weight: 131 lb (59.4 kg) IBW/kg (Calculated) : 61.5 Heparin Dosing Weight:   Vital Signs: Temp: 98.3 F (36.8 C) (10/09 0803) Temp Source: Oral (10/09 0803) BP: 125/68 (10/09 0803) Pulse Rate: 111 (10/09 0803)  Labs:  Recent Labs  06/27/17 2152 06/28/17 0435  HGB 11.7* 11.0*  HCT 35.2* 32.4*  PLT 142* 130*  CREATININE 1.11 1.06  TROPONINI <0.03  --     Estimated Creatinine Clearance: 52.9 mL/min (by C-G formula based on SCr of 1.06 mg/dL).   Medical History: Past Medical History:  Diagnosis Date  . CAD (coronary artery disease)   . Diabetes ([place])   . H/O: stroke   . HTN (hypertension)     Medications:  Scheduled:  . amiodarone  150 mg Intravenous Once  . apixaban  5 mg Oral BID  . atorvastatin  80 mg Oral Daily  . carvedilol  3.125 mg Oral BID  . furosemide  20 mg Oral Daily  . lisinopril  5 mg Oral [id number]    Assessment: Patient is a 71 year old male found to be in afib w/ RVR. Pharmacy consulted to start anticoagulation w/ apixaban. Pt has a CHA2DS2-VASc score of 5.  Goal of Therapy:   Monitor platelets by anticoagulation protocol: Yes   Plan:  apixaban 5mg  BID Monitor CBC and scr q 3 days while inpatient  Keonna Raether D Kellsie Grindle, Pharm.D, BCPS Clinical Pharmacist   06/28/2017,9:24 AM

## 2017-06-28 NOTE — Discharge Instructions (Signed)
Information on my medicine - ELIQUIS (apixaban)  This medication education was reviewed with me or my healthcare representative as part of my discharge preparation.  The pharmacist that spoke with me during my hospital stay was:  Candelaria Stagers, [facility]  Why was Eliquis prescribed for you? Eliquis was prescribed for you to reduce the risk of forming blood clots that can cause a stroke if you have a medical condition called atrial fibrillation (a type of irregular heartbeat) OR to reduce the risk of a blood clots forming after orthopedic surgery.  What do You need to know about Eliquis ? Take your Eliquis TWICE DAILY - one tablet in the morning and one tablet in the evening with or without food.  It would be best to take the doses about the same time each day.  If you have difficulty swallowing the tablet whole please discuss with your pharmacist how to take the medication safely.  Take Eliquis exactly as prescribed by your doctor and DO NOT stop taking Eliquis without talking to the doctor who prescribed the medication.  Stopping may increase your risk of developing a new clot or stroke.  Refill your prescription before you run out.  After discharge, you should have regular check-up appointments with your healthcare provider that is prescribing your Eliquis.  In the future your dose may need to be changed if your kidney function or weight changes by a significant amount or as you get older.  What do you do if you miss a dose? If you miss a dose, take it as soon as you remember on the same day and resume taking twice daily.  Do not take more than one dose of ELIQUIS at the same time.  Important Safety Information A possible side effect of Eliquis is bleeding. You should call your healthcare provider right away if you experience any of the following: ? Bleeding from an injury or your nose that does not stop. ? Unusual colored urine (red or dark brown) or unusual colored stools (red or  black). ? Unusual bruising for unknown reasons. ? A serious fall or if you hit your head (even if there is no bleeding).  Some medicines may interact with Eliquis and might increase your risk of bleeding or clotting while on Eliquis. To help avoid this, consult your healthcare provider or pharmacist prior to using any new prescription or non-prescription medications, including herbals, vitamins, non-steroidal anti-inflammatory drugs (NSAIDs) and supplements.  This website has more information on Eliquis (apixaban): [email or web address].

## 2017-06-28 NOTE — Consult Note (Signed)
Walter Blair is a 71 y.o. male  [id number]  Primary Cardiologist: Neoma Laming Reason for Consultation: Atrial fibrillation  HPI: This is a 71 year old [name] male who has history of CABG and myocardial infarction presented to the hospital with dizziness and confusion. Patient states per the past couple of days he was feeling dizzy and his Coreg was reduced from 6.25 twice a day to 3.125 twice a day because of hypotension. But this admission he was found to have atrial fibrillation with rapid ventricular response rate.   Review of Systems: No orthopnea PND and leg swelling   Past Medical History:  Diagnosis Date  . CAD (coronary artery disease)   . Diabetes ([place])   . H/O: stroke   . HTN (hypertension)     Medications Prior to Admission  Medication Sig Dispense Refill  . atorvastatin (LIPITOR) 80 MG tablet Take 80 mg by mouth daily.    . carvedilol (COREG) 3.125 MG tablet Take 3.125 mg by mouth 2 (two) times daily.    . folic acid (FOLVITE) 1 MG tablet Take 1 mg by mouth every morning.    . furosemide (LASIX) 20 MG tablet Take 20 mg by mouth daily.    Marland Kitchen lisinopril (PRINIVIL,ZESTRIL) 5 MG tablet Take 5 mg by mouth every morning.    . metFORMIN (GLUCOPHAGE-XR) 500 MG 24 hr tablet Take 500-1,000 mg by mouth 2 (two) times daily. 500mg  in the morning and 1000mg  at night    . RA ASPIRIN EC ADULT LOW ST 81 MG EC tablet Take 81 mg by mouth daily.       Marland Kitchen atorvastatin  80 mg Oral Daily  . carvedilol  3.125 mg Oral BID  . enoxaparin (LOVENOX) injection  40 mg Subcutaneous Q24H  . furosemide  20 mg Oral Daily  . lisinopril  5 mg Oral [id number]    Infusions: . sodium chloride 75 mL/hr at 06/28/17 0151  . amiodarone     Followed by  . amiodarone      No Known Allergies  Social History   Social History  . Marital status: Widowed    Spouse name: N/A  . Number of children: N/A  . Years of education: N/A   Occupational History  . Not on file.   Social History Main  Topics  . Smoking status: Never Smoker  . Smokeless tobacco: Never Used  . Alcohol use No  . Drug use: No  . Sexual activity: Not on file   Other Topics Concern  . Not on file   Social History Narrative  . No narrative on file    Family History  Problem Relation Age of Onset  . Diabetes Brother   . Hypertension Mother   . Diabetes Sister     PHYSICAL EXAM: Vitals:   06/28/17 0609 06/28/17 0803  BP:  125/68  Pulse: 95 (!) 111  Resp:  18  Temp:  98.3 F (36.8 C)  SpO2:  98%     Intake/Output Summary (Last 24 hours) at 06/28/17 0906 Last data filed at 06/28/17 0433  Gross per 24 hour  Intake            86.25 ml  Output              425 ml  Net          -338.75 ml    General:  Well appearing. No respiratory difficulty HEENT: normal Neck: supple. no JVD. Carotids 2+ bilat; no bruits. No lymphadenopathy or thryomegaly  appreciated. Cor: PMI nondisplaced. Regular rate & rhythm. No rubs, gallops or murmurs. Lungs: clear Abdomen: soft, nontender, nondistended. No hepatosplenomegaly. No bruits or masses. Good bowel sounds. Extremities: no cyanosis, clubbing, rash, edema Neuro: alert & oriented x 3, cranial nerves grossly intact. moves all 4 extremities w/o difficulty. Affect pleasant.  NLZ:JQBHAL fibrillation with rapid ventricular response rate  Results for orders placed or performed during the hospital encounter of 06/27/17 (from the past 24 hour(s))  Basic metabolic panel     Status: Abnormal   Collection Time: 06/27/17  9:52 PM  Result Value Ref Range   Sodium 139 135 - 145 mmol/L   Potassium 4.5 3.5 - 5.1 mmol/L   Chloride 103 101 - 111 mmol/L   CO2 27 22 - 32 mmol/L   Glucose, Bld 213 (H) 65 - 99 mg/dL   BUN 20 6 - 20 mg/dL   Creatinine, Ser 1.11 0.61 - 1.24 mg/dL   Calcium 9.2 8.9 - 10.3 mg/dL   GFR calc non Af Amer >60 >60 mL/min   GFR calc Af Amer >60 >60 mL/min   Anion gap 9 5 - 15  CBC     Status: Abnormal   Collection Time: 06/27/17  9:52 PM   Result Value Ref Range   WBC 5.3 3.8 - 10.6 K/uL   RBC 4.12 (L) 4.40 - 5.90 MIL/uL   Hemoglobin 11.7 (L) 13.0 - 18.0 g/dL   HCT 35.2 (L) 40.0 - 52.0 %   MCV 85.4 80.0 - 100.0 fL   MCH 28.4 26.0 - 34.0 pg   MCHC 33.3 32.0 - 36.0 g/dL   RDW 15.3 (H) 11.5 - 14.5 %   Platelets 142 (L) 150 - 440 K/uL  Troponin I     Status: None   Collection Time: 06/27/17  9:52 PM  Result Value Ref Range   Troponin I <0.03 <0.03 ng/mL  CBC     Status: Abnormal   Collection Time: 06/28/17  4:35 AM  Result Value Ref Range   WBC 4.8 3.8 - 10.6 K/uL   RBC 3.80 (L) 4.40 - 5.90 MIL/uL   Hemoglobin 11.0 (L) 13.0 - 18.0 g/dL   HCT 32.4 (L) 40.0 - 52.0 %   MCV 85.4 80.0 - 100.0 fL   MCH 29.1 26.0 - 34.0 pg   MCHC 34.1 32.0 - 36.0 g/dL   RDW 15.3 (H) 11.5 - 14.5 %   Platelets 130 (L) 150 - 440 K/uL  Basic metabolic panel     Status: Abnormal   Collection Time: 06/28/17  4:35 AM  Result Value Ref Range   Sodium 141 135 - 145 mmol/L   Potassium 3.5 3.5 - 5.1 mmol/L   Chloride 108 101 - 111 mmol/L   CO2 28 22 - 32 mmol/L   Glucose, Bld 119 (H) 65 - 99 mg/dL   BUN 17 6 - 20 mg/dL   Creatinine, Ser 1.06 0.61 - 1.24 mg/dL   Calcium 8.5 (L) 8.9 - 10.3 mg/dL   GFR calc non Af Amer >60 >60 mL/min   GFR calc Af Amer >60 >60 mL/min   Anion gap 5 5 - 15   Dg Chest 2 View  Result Date: 06/27/2017 CLINICAL DATA:  Dizziness for 1 day. EXAM: CHEST  2 VIEW COMPARISON:  None. FINDINGS: Heart size is mildly enlarged. Evidence of prior CABG procedure. There is a left cardiac ICD with a lead in the right ventricle. The lungs are clear without airspace disease or pulmonary edema. No large  pleural effusions. Bony thorax is intact. IMPRESSION: No active cardiopulmonary disease. Electronically Signed   By: Markus Daft M.D.   On: 06/27/2017 22:16   Ct Head Wo Contrast  Result Date: 06/28/2017 CLINICAL DATA:  Dizziness for 1 day, history MI, stroke, hypertension, diabetes mellitus EXAM: CT HEAD WITHOUT CONTRAST TECHNIQUE:  Contiguous axial images were obtained from the base of the skull through the vertex without intravenous contrast. Sagittal and coronal MPR images reconstructed from axial data set. COMPARISON:  None FINDINGS: Brain: Generalized atrophy. Normal ventricular morphology. No midline shift or mass effect. Minimal small vessel chronic ischemic changes of deep cerebral white matter. Old RIGHT frontoparietal MCA territory infarct. No intracranial hemorrhage, mass lesion, or evidence acute infarction. No extra-axial fluid collections. Vascular: Atherosclerotic calcification of internal carotid arteries bilaterally at skullbase Skull: Intact Sinuses/Orbits: Clear Other: N/A IMPRESSION: Atrophy with minimal small vessel chronic ischemic changes of deep cerebral white matter. Old RIGHT MCA territory infarct. No acute intracranial abnormalities. Electronically Signed   By: Lavonia Dana M.D.   On: 06/28/2017 00:53     ASSESSMENT AND PLAN: Atrial fibrillation with rapid ventricular response rate which is new onset. Advise anticoagulation with possible Xarelto and started the patient on amiodarone drip as per protocol and once he converts will switch to by mouth 400 by mouth twice a day. If he doesn't convert will do electrical cardioversion. Will get echocardiogram to further evaluate ejection fraction.  Annastasia Haskins A

## 2017-06-28 NOTE — Progress Notes (Signed)
Nurse page [facility] for a pt in R[place] 236 who had expressed that he was ready to complete Advanced Directives. [place] met pt and family, reviewed the material, and then [facility] gathered 2 witnesses and a Insurance account manager and assisted pt to complete AD. [place] gave original and 2 extra copies to pt and placed a copy in pt's chart.     06/28/17 1600  Clinical Encounter Type  Visited With Patient and family together  Visit Type Follow-up  Referral From Nurse  Consult/Referral To Chaplain  Spiritual Encounters  Spiritual Needs Literature  Stress Factors  Patient Stress Factors Major life changes  Family Stress Factors Health changes  Advance Directives (For Healthcare)  Does Patient Have a Medical Advance Directive? Yes  Does patient want to make changes to medical advance directive? Yes (Inpatient - patient requests chaplain consult to change a medical advance directive)  Type of Advance Directive [place];Living will  Copy of [place] in Chart? Yes  Copy of Living Will in Chart? Yes  Would patient like information on creating a medical advance directive? Yes (Inpatient - patient requests chaplain consult to create a medical advance directive)  [place] Directives  Does Patient Have a Mental Health Advance Directive? No  Would patient like information on creating a mental health advance directive? No - Patient declined

## 2017-06-28 NOTE — Progress Notes (Signed)
Chaplain responded to a consult for a pt in [place] who wanted to compete Advanced directives. CH met pt and family member at bedside and educated pt on AD. Pt stated he will review material and contact [place] when ready to complete at 12:00 pm. CH to follow up pt as needed.    06/28/17 1000  Clinical Encounter Type  Visited With Patient and family together  Visit Type Initial;Other (Comment)  Referral From Nurse  Consult/Referral To Chaplain  Spiritual Encounters  Spiritual Needs Literature;Brochure

## 2017-06-28 NOTE — ED Notes (Signed)
Patient transported to CT 

## 2017-06-29 ENCOUNTER — Other Ambulatory Visit: Payer: Self-pay | Admitting: Cardiovascular Disease

## 2017-06-29 ENCOUNTER — Inpatient Hospital Stay
Admit: 2017-06-29 | Discharge: 2017-06-29 | Disposition: A | Discharge status: Home / Self Care | Payer: Medicare Other | Attending: Cardiovascular Disease | Admitting: Cardiovascular Disease

## 2017-06-29 LAB — GLUCOSE, CAPILLARY
Glucose-Capillary: 105 mg/dL — ABNORMAL HIGH (ref 65–99)
Glucose-Capillary: 111 mg/dL — ABNORMAL HIGH (ref 65–99)
Glucose-Capillary: 127 mg/dL — ABNORMAL HIGH (ref 65–99)

## 2017-06-29 MED ORDER — SODIUM CHLORIDE 0.9 % IV SOLN: 250.0000 mL | INTRAVENOUS | Status: DC

## 2017-06-29 MED ORDER — SODIUM CHLORIDE 0.9 % IV SOLN
INTRAVENOUS | Status: DC
  Administered 2017-06-30: via INTRAVENOUS

## 2017-06-29 MED ORDER — SODIUM CHLORIDE 0.9% FLUSH: 3.0000 mL | INTRAVENOUS | Status: DC | PRN

## 2017-06-29 MED ORDER — SODIUM CHLORIDE 0.9% FLUSH: 3.0000 mL | Freq: Two times a day (BID) | INTRAVENOUS | Status: DC

## 2017-06-29 MED ORDER — SODIUM CHLORIDE 0.9 % IV SOLN: INTRAVENOUS | Status: DC

## 2017-06-29 NOTE — Progress Notes (Addendum)
SUBJECTIVE: Pt is feeling well no chest pain or shortness of breath.   Vitals:   06/28/17 1922 06/28/17 2327 06/29/17 0312 06/29/17 0853  BP: 113/76 98/65 119/86 129/86  Pulse: 98 98 99 89  Resp: 18  16 18   Temp: 97.7 F (36.5 C)  98.1 F (36.7 C) 97.6 F (36.4 C)  TempSrc: Oral  Oral Oral  SpO2: 97% 95% 98% 97%  Weight:      Height:        Intake/Output Summary (Last 24 hours) at 06/29/17 1114 Last data filed at 06/29/17 0311  Gross per 24 hour  Intake          1416.15 ml  Output              200 ml  Net          1216.15 ml    LABS: Basic Metabolic Panel:  Recent Labs  06/27/17 2152 06/28/17 0435  NA 139 141  K 4.5 3.5  CL 103 108  CO2 27 28  GLUCOSE 213* 119*  BUN 20 17  CREATININE 1.11 1.06  CALCIUM 9.2 8.5*   Liver Function Tests: No results for input(s): AST, ALT, ALKPHOS, BILITOT, PROT, ALBUMIN in the last 72 hours. No results for input(s): LIPASE, AMYLASE in the last 72 hours. CBC:  Recent Labs  06/27/17 2152 06/28/17 0435  WBC 5.3 4.8  HGB 11.7* 11.0*  HCT 35.2* 32.4*  MCV 85.4 85.4  PLT 142* 130*   Cardiac Enzymes:  Recent Labs  06/27/17 2152  TROPONINI <0.03   BNP: Invalid input(s): POCBNP D-Dimer: No results for input(s): DDIMER in the last 72 hours. Hemoglobin A1C: No results for input(s): HGBA1C in the last 72 hours. Fasting Lipid Panel: No results for input(s): CHOL, HDL, LDLCALC, TRIG, CHOLHDL, LDLDIRECT in the last 72 hours. Thyroid Function Tests: No results for input(s): TSH, T4TOTAL, T3FREE, THYROIDAB in the last 72 hours.  Invalid input(s): FREET3 Anemia Panel: No results for input(s): VITAMINB12, FOLATE, FERRITIN, TIBC, IRON, RETICCTPCT in the last 72 hours.   PHYSICAL EXAM General: Well developed, well nourished, in no acute distress HEENT:  Normocephalic and atramatic Neck:  No JVD.  Lungs: Clear bilaterally to auscultation and percussion. Heart: HRRR . Normal S1 and S2 without gallops or murmurs.  Abdomen:  Bowel sounds are positive, abdomen soft and non-tender  Msk:  Back normal, normal gait. Normal strength and tone for age. Extremities: No clubbing, cyanosis or edema.   Neuro: Alert and oriented X 3. Psych:  Good affect, responds appropriately  TELEMETRY: Afib 92bpm  ASSESSMENT AND PLAN: Atrial fibrillation with rate controlled. Pt is feeling well, no chest pain or shortness of breath. Pt is not converting with amiodarone drip. Schedule TEE and electrical cardioversion tomorrow morning 7:30am.   Principal Problem:   Atrial fibrillation with RVR (HCC) Active Problems:   Diabetes ([place])   HTN (hypertension)   CAD (coronary artery disease)    Jake Bathe, NP-C 06/29/2017 11:14 AM

## 2017-06-29 NOTE — Progress Notes (Signed)
[place] at [place] NAME: Walter Blair    MR#:  [id number]  DATE OF BIRTH:  06/24/1946  SUBJECTIVE:   Pt. Here due to dizziness and noted to be in a. Fib w/ RVR.  Clinically asymptomatic now, remains in A. fib but rate controlled. Plan for TEE cardioversion tomorrow.  REVIEW OF SYSTEMS:    Review of Systems  Constitutional: Negative for chills and fever.  HENT: Negative for congestion and tinnitus.   Eyes: Negative for blurred vision and double vision.  Respiratory: Negative for cough, shortness of breath and wheezing.   Cardiovascular: Negative for chest pain, orthopnea and PND.  Gastrointestinal: Negative for abdominal pain, diarrhea, nausea and vomiting.  Genitourinary: Negative for dysuria and hematuria.  Neurological: Negative for dizziness, sensory change and focal weakness.  All other systems reviewed and are negative.   Nutrition: Heart Healthy/Carb control Tolerating Diet: Yes Tolerating PT: Ambulatory  DRUG ALLERGIES:  No Known Allergies  VITALS:  Blood pressure 129/86, pulse 89, temperature 97.6 F (36.4 C), temperature source Oral, resp. rate 18, height 5\' 5"  (1.651 m), weight 59.4 kg (131 lb), SpO2 97 %.  PHYSICAL EXAMINATION:   Physical Exam  GENERAL:  71 y.o.-year-old patient lying in bed in no acute distress.  EYES: Pupils equal, round, reactive to light and accommodation. No scleral icterus. Extraocular muscles intact.  HEENT: Head atraumatic, normocephalic. Oropharynx and nasopharynx clear.  NECK:  Supple, no jugular venous distention. No thyroid enlargement, no tenderness.  LUNGS: Normal breath sounds bilaterally, no wheezing, rales, rhonchi. No use of accessory muscles of respiration.  CARDIOVASCULAR: S1, S2 Irregular rate. No murmurs, rubs, or gallops.  ABDOMEN: Soft, nontender, nondistended. Bowel sounds present. No organomegaly or mass.  EXTREMITIES: No cyanosis, clubbing or edema b/l.    NEUROLOGIC:  Cranial nerves II through XII are intact. No focal Motor or sensory deficits b/l.   PSYCHIATRIC: The patient is alert and oriented x 3.  SKIN: No obvious rash, lesion, or ulcer.    LABORATORY PANEL:   CBC  Recent Labs Lab 06/28/17 0435  WBC 4.8  HGB 11.0*  HCT 32.4*  PLT 130*   ------------------------------------------------------------------------------------------------------------------  Chemistries   Recent Labs Lab 06/28/17 0435  NA 141  K 3.5  CL 108  CO2 28  GLUCOSE 119*  BUN 17  CREATININE 1.06  CALCIUM 8.5*   ------------------------------------------------------------------------------------------------------------------  Cardiac Enzymes  Recent Labs Lab 06/27/17 2152  TROPONINI <0.03   ------------------------------------------------------------------------------------------------------------------  RADIOLOGY:  Dg Chest 2 View  Result Date: 06/27/2017 CLINICAL DATA:  Dizziness for 1 day. EXAM: CHEST  2 VIEW COMPARISON:  None. FINDINGS: Heart size is mildly enlarged. Evidence of prior CABG procedure. There is a left cardiac ICD with a lead in the right ventricle. The lungs are clear without airspace disease or pulmonary edema. No large pleural effusions. Bony thorax is intact. IMPRESSION: No active cardiopulmonary disease. Electronically Signed   By: Markus Daft M.D.   On: 06/27/2017 22:16   Ct Head Wo Contrast  Result Date: 06/28/2017 CLINICAL DATA:  Dizziness for 1 day, history MI, stroke, hypertension, diabetes mellitus EXAM: CT HEAD WITHOUT CONTRAST TECHNIQUE: Contiguous axial images were obtained from the base of the skull through the vertex without intravenous contrast. Sagittal and coronal MPR images reconstructed from axial data set. COMPARISON:  None FINDINGS: Brain: Generalized atrophy. Normal ventricular morphology. No midline shift or mass effect. Minimal small vessel chronic ischemic changes of deep cerebral white matter. Old RIGHT  frontoparietal MCA territory  infarct. No intracranial hemorrhage, mass lesion, or evidence acute infarction. No extra-axial fluid collections. Vascular: Atherosclerotic calcification of internal carotid arteries bilaterally at skullbase Skull: Intact Sinuses/Orbits: Clear Other: N/A IMPRESSION: Atrophy with minimal small vessel chronic ischemic changes of deep cerebral white matter. Old RIGHT MCA territory infarct. No acute intracranial abnormalities. Electronically Signed   By: Lavonia Dana M.D.   On: 06/28/2017 00:53     ASSESSMENT AND PLAN:   71 year old male with past medical history of coronary artery disease status post bypass, diabetes, hypertension, history of previous CVA who presented to the hospital due to dizziness and noted to be in acute atrial fibrillation with rapid ventricular response.   1. Atrial fibrillation with rapid ventricular response-this is the cause of patient's dizziness. -Patient's heart rates are improved since admission.  Cont. Amio gtt.  Coreg, Eliquis.  - not converted Pharmacologically and plan for TEE Cardioversion tomorrow as per Cards.  - Clinically asymptomatic  2. Essential hypertension-continue carvedilol, lisinopril.  3. Diabetes type 2 without complication-continue sliding scale insulin. Hold metformin. - BS STable.   4. History of coronary artery disease-no acute chest pain presently. Continue carvedilol, atorvastatin, lisinopril.   All the records are reviewed and case discussed with Care Management/Social Worker. Management plans discussed with the patient, family and they are in agreement.  CODE STATUS: Full code  DVT Prophylaxis: Eliquis  TOTAL TIME TAKING CARE OF THIS PATIENT: 25 minutes.   POSSIBLE D/C IN 1-2 DAYS, DEPENDING ON CLINICAL CONDITION.   Henreitta Leber M.D on 06/29/2017 at 2:45 PM  Between 7am to 6pm - Pager - [phone]  After 6pm go to [email or web address] - Proofreader  [facility]   Office  [phone]  CC: Primary care physician; Jodi Marble, MD

## 2017-06-29 NOTE — Plan of Care (Signed)
Problem: Cardiac: Goal: Ability to achieve and maintain adequate cardiopulmonary perfusion will improve Outcome: Progressing Patient on amiodarone gtt at 16.66mL/hr. Heart rate maintaining in 90s in atrial fibrillation. Cardioversion has been scheduled for tomorrow morning.

## 2017-06-29 NOTE — Progress Notes (Signed)
*  PRELIMINARY RESULTS* Echocardiogram 2D Echocardiogram has been performed.  Walter Blair 06/29/2017, 9:56 AM

## 2017-06-30 ENCOUNTER — Inpatient Hospital Stay: Payer: Medicare Other

## 2017-06-30 DIAGNOSIS — Z452 Encounter for adjustment and management of vascular access device: Secondary | ICD-10-CM

## 2017-06-30 DIAGNOSIS — I4891 Unspecified atrial fibrillation: Secondary | ICD-10-CM

## 2017-06-30 DIAGNOSIS — R42 Dizziness and giddiness: Secondary | ICD-10-CM

## 2017-06-30 DIAGNOSIS — R0602 Shortness of breath: Secondary | ICD-10-CM

## 2017-06-30 DIAGNOSIS — J9601 Acute respiratory failure with hypoxia: Secondary | ICD-10-CM

## 2017-06-30 LAB — BLOOD GAS, ARTERIAL
Acid-base deficit: 4 mmol/L — ABNORMAL HIGH (ref 0.0–2.0)
BICARBONATE: 19.5 mmol/L — AB (ref 20.0–28.0)
FIO2: 35
LHR: 16 {breaths}/min
MECHVT: 500 mL
O2 Saturation: 97.4 %
PATIENT TEMPERATURE: 37
PCO2 ART: 30 mmHg — AB (ref 32.0–48.0)
PEEP: 5 cmH2O
PH ART: 7.42 (ref 7.350–7.450)
pO2, Arterial: 94 mmHg (ref 83.0–108.0)

## 2017-06-30 LAB — GLUCOSE, CAPILLARY
GLUCOSE-CAPILLARY: 130 mg/dL — AB (ref 65–99)
GLUCOSE-CAPILLARY: 90 mg/dL (ref 65–99)
GLUCOSE-CAPILLARY: 124 mg/dL — AB (ref 65–99)
Glucose-Capillary: 114 mg/dL — ABNORMAL HIGH (ref 65–99)
Glucose-Capillary: 125 mg/dL — ABNORMAL HIGH (ref 65–99)
Glucose-Capillary: 143 mg/dL — ABNORMAL HIGH (ref 65–99)

## 2017-06-30 LAB — CBC
HCT: 36.6 % — ABNORMAL LOW (ref 40.0–52.0)
HEMATOCRIT: 39.1 % — AB (ref 40.0–52.0)
HEMOGLOBIN: 12.6 g/dL — AB (ref 13.0–18.0)
HEMOGLOBIN: 13 g/dL (ref 13.0–18.0)
MCH: 29.2 pg (ref 26.0–34.0)
MCH: 28.6 pg (ref 26.0–34.0)
MCHC: 34.4 g/dL (ref 32.0–36.0)
MCHC: 33.2 g/dL (ref 32.0–36.0)
MCV: 84.9 fL (ref 80.0–100.0)
MCV: 86.2 fL (ref 80.0–100.0)
PLATELETS: 158 10*3/uL (ref 150–440)
Platelets: 140 10*3/uL — ABNORMAL LOW (ref 150–440)
RBC: 4.31 MIL/uL — AB (ref 4.40–5.90)
RBC: 4.54 MIL/uL (ref 4.40–5.90)
RDW: 15.7 % — ABNORMAL HIGH (ref 11.5–14.5)
RDW: 15.6 % — AB (ref 11.5–14.5)
WBC: 9.1 10*3/uL (ref 3.8–10.6)
WBC: 10.9 10*3/uL — ABNORMAL HIGH (ref 3.8–10.6)

## 2017-06-30 LAB — COMPREHENSIVE METABOLIC PANEL
ALK PHOS: 85 U/L (ref 38–126)
ALT: 1018 U/L — ABNORMAL HIGH (ref 17–63)
ANION GAP: 16 — AB (ref 5–15)
AST: 1335 U/L — ABNORMAL HIGH (ref 15–41)
Albumin: 3.9 g/dL (ref 3.5–5.0)
BILIRUBIN TOTAL: 2.8 mg/dL — AB (ref 0.3–1.2)
BUN: 25 mg/dL — AB (ref 6–20)
CO2: 19 mmol/L — AB (ref 22–32)
Calcium: 8.6 mg/dL — ABNORMAL LOW (ref 8.9–10.3)
Chloride: 103 mmol/L (ref 101–111)
Creatinine, Ser: 1.29 mg/dL — ABNORMAL HIGH (ref 0.61–1.24)
GFR, EST NON AFRICAN AMERICAN: 54 mL/min — AB (ref >60)
GLUCOSE: 105 mg/dL — AB (ref 65–99)
Potassium: 4.1 mmol/L (ref 3.5–5.1)
Sodium: 138 mmol/L (ref 135–145)
TOTAL PROTEIN: 6.9 g/dL (ref 6.5–8.1)

## 2017-06-30 LAB — MRSA PCR SCREENING: MRSA by PCR: NEGATIVE

## 2017-06-30 LAB — BASIC METABOLIC PANEL
ANION GAP: 10 (ref 5–15)
BUN: 22 mg/dL — AB (ref 6–20)
CALCIUM: 8.5 mg/dL — AB (ref 8.9–10.3)
CO2: 26 mmol/L (ref 22–32)
Chloride: 102 mmol/L (ref 101–111)
Creatinine, Ser: 1.19 mg/dL (ref 0.61–1.24)
GFR calc Af Amer: >60 mL/min (ref >60)
GFR, EST NON AFRICAN AMERICAN: 59 mL/min — AB (ref >60)
GLUCOSE: 148 mg/dL — AB (ref 65–99)
POTASSIUM: 3.9 mmol/L (ref 3.5–5.1)
SODIUM: 138 mmol/L (ref 135–145)

## 2017-06-30 LAB — MAGNESIUM
MAGNESIUM: 2.1 mg/dL (ref 1.7–2.4)
MAGNESIUM: 1.9 mg/dL (ref 1.7–2.4)

## 2017-06-30 LAB — APTT

## 2017-06-30 LAB — ECHOCARDIOGRAM COMPLETE
Height: 65 in
Weight: 2096 oz

## 2017-06-30 LAB — HEPARIN LEVEL (UNFRACTIONATED): Heparin Unfractionated: 2 IU/mL — ABNORMAL HIGH (ref 0.30–0.70)

## 2017-06-30 LAB — PHOSPHORUS: PHOSPHORUS: 3.6 mg/dL (ref 2.5–4.6)

## 2017-06-30 SURGERY — ECHOCARDIOGRAM, TRANSESOPHAGEAL
Anesthesia: Moderate Sedation

## 2017-06-30 MED ORDER — LORAZEPAM 2 MG/ML IJ SOLN
2.0000 mg | Freq: Once | INTRAMUSCULAR | Status: AC
  Administered 2017-06-30: 2 mg via INTRAVENOUS
  Filled 2017-06-30: qty 1

## 2017-06-30 MED ORDER — MIDAZOLAM HCL 2 MG/2ML IJ SOLN
INTRAMUSCULAR | Status: AC
  Filled 2017-06-30: qty 2

## 2017-06-30 MED ORDER — SODIUM CHLORIDE 0.9% FLUSH
10.0000 mL | Freq: Two times a day (BID) | INTRAVENOUS | Status: DC
  Administered 2017-06-30: 30 mL
  Administered 2017-07-01: 10 mL

## 2017-06-30 MED ORDER — ORAL CARE MOUTH RINSE
15.0000 mL | OROMUCOSAL | Status: DC
  Administered 2017-06-30 – 2017-07-01 (×12): 15 mL via OROMUCOSAL

## 2017-06-30 MED ORDER — VITAL AF 1.2 CAL PO LIQD
1000.0000 mL | ORAL | Status: DC
  Administered 2017-06-30: 1000 mL

## 2017-06-30 MED ORDER — MORPHINE SULFATE (PF) 2 MG/ML IV SOLN: 2.0000 mg | Freq: Once | INTRAVENOUS | Status: DC

## 2017-06-30 MED ORDER — VECURONIUM BROMIDE 10 MG IV SOLR
10.0000 mg | Freq: Once | INTRAVENOUS | Status: AC
  Administered 2017-06-30: 10 mg via INTRAVENOUS

## 2017-06-30 MED ORDER — FENTANYL 2500MCG IN NS 250ML (10MCG/ML) PREMIX INFUSION
25.0000 ug/h | INTRAVENOUS | Status: DC
  Administered 2017-06-30: 100 ug/h via INTRAVENOUS
  Administered 2017-07-01: 200 ug/h via INTRAVENOUS
  Filled 2017-06-30 (×2): qty 250

## 2017-06-30 MED ORDER — MIDAZOLAM HCL 2 MG/2ML IJ SOLN
4.0000 mg | Freq: Once | INTRAMUSCULAR | Status: AC
  Administered 2017-06-30: 4 mg via INTRAVENOUS

## 2017-06-30 MED ORDER — MIDAZOLAM HCL 2 MG/2ML IJ SOLN
1.0000 mg | INTRAMUSCULAR | Status: DC | PRN
  Administered 2017-06-30 – 2017-07-01 (×3): 1 mg via INTRAVENOUS
  Filled 2017-06-30 (×3): qty 2

## 2017-06-30 MED ORDER — DEXTROSE 5 % IV SOLN
0.0000 ug/min | INTRAVENOUS | Status: DC
  Filled 2017-06-30: qty 4

## 2017-06-30 MED ORDER — VECURONIUM BROMIDE 10 MG IV SOLR
INTRAVENOUS | Status: AC
  Filled 2017-06-30: qty 10

## 2017-06-30 MED ORDER — FENTANYL BOLUS VIA INFUSION
25.0000 ug | INTRAVENOUS | Status: DC | PRN
  Filled 2017-06-30: qty 25

## 2017-06-30 MED ORDER — POTASSIUM CHLORIDE CRYS ER 20 MEQ PO TBCR
40.0000 meq | EXTENDED_RELEASE_TABLET | Freq: Once | ORAL | Status: AC
  Administered 2017-06-30: 40 meq via ORAL
  Filled 2017-06-30: qty 2

## 2017-06-30 MED ORDER — PANTOPRAZOLE SODIUM 40 MG IV SOLR
40.0000 mg | INTRAVENOUS | Status: DC
  Administered 2017-06-30 – 2017-07-01 (×2): 40 mg via INTRAVENOUS
  Filled 2017-06-30 (×2): qty 40

## 2017-06-30 MED ORDER — SODIUM CHLORIDE 0.9% FLUSH: 10.0000 mL | INTRAVENOUS | Status: DC | PRN

## 2017-06-30 MED ORDER — METOPROLOL SUCCINATE ER 50 MG PO TB24: 50.0000 mg | ORAL_TABLET | Freq: Every day | ORAL | Status: DC

## 2017-06-30 MED ORDER — FUROSEMIDE 10 MG/ML IJ SOLN
40.0000 mg | Freq: Two times a day (BID) | INTRAMUSCULAR | Status: DC
  Administered 2017-06-30 – 2017-07-01 (×4): 40 mg via INTRAVENOUS
  Filled 2017-06-30 (×4): qty 4

## 2017-06-30 MED ORDER — FENTANYL CITRATE (PF) 100 MCG/2ML IJ SOLN
25.0000 ug | Freq: Once | INTRAMUSCULAR | Status: DC
  Filled 2017-06-30: qty 2

## 2017-06-30 MED ORDER — HEPARIN (PORCINE) IN NACL 100-0.45 UNIT/ML-% IJ SOLN
850.0000 [IU]/h | INTRAMUSCULAR | Status: DC
  Administered 2017-06-30: 850 [IU]/h via INTRAVENOUS
  Filled 2017-06-30: qty 250

## 2017-06-30 MED ORDER — INSULIN ASPART 100 UNIT/ML ~~LOC~~ SOLN
2.0000 [IU] | SUBCUTANEOUS | Status: DC
  Administered 2017-06-30 – 2017-07-01 (×3): 2 [IU] via SUBCUTANEOUS
  Administered 2017-07-01: 4 [IU] via SUBCUTANEOUS
  Administered 2017-07-02: 2 [IU] via SUBCUTANEOUS
  Filled 2017-06-30 (×4): qty 1

## 2017-06-30 MED ORDER — FENTANYL CITRATE (PF) 100 MCG/2ML IJ SOLN
200.0000 ug | Freq: Once | INTRAMUSCULAR | Status: AC
Start: 2017-06-30 — End: 2017-06-30
  Administered 2017-06-30: 200 ug via INTRAVENOUS
  Filled 2017-06-30: qty 4

## 2017-06-30 MED ORDER — PROPOFOL 10 MG/ML IV BOLUS
INTRAVENOUS | Status: AC
Start: 2017-06-30 — End: ?
  Filled 2017-06-30: qty 20

## 2017-06-30 MED ORDER — HEPARIN (PORCINE) IN NACL 100-0.45 UNIT/ML-% IJ SOLN
700.0000 [IU]/h | INTRAMUSCULAR | Status: DC
  Filled 2017-06-30: qty 250

## 2017-06-30 MED ORDER — HALOPERIDOL LACTATE 5 MG/ML IJ SOLN
2.5000 mg | Freq: Four times a day (QID) | INTRAMUSCULAR | Status: DC | PRN
  Administered 2017-06-30 – 2017-07-02 (×3): 2.5 mg via INTRAVENOUS
  Filled 2017-06-30 (×3): qty 1

## 2017-06-30 MED ORDER — CHLORHEXIDINE GLUCONATE 0.12% ORAL RINSE (MEDLINE KIT)
15.0000 mL | Freq: Two times a day (BID) | OROMUCOSAL | Status: DC
  Administered 2017-06-30 – 2017-07-01 (×3): 15 mL via OROMUCOSAL

## 2017-06-30 MED ORDER — SACUBITRIL-VALSARTAN 24-26 MG PO TABS: 1.0000 | ORAL_TABLET | Freq: Two times a day (BID) | ORAL | Status: DC

## 2017-06-30 MED ORDER — HEPARIN BOLUS VIA INFUSION
1500.0000 [IU] | Freq: Once | INTRAVENOUS | Status: AC
  Administered 2017-06-30: 1500 [IU] via INTRAVENOUS
  Filled 2017-06-30: qty 1500

## 2017-06-30 MED ORDER — VITAL HIGH PROTEIN PO LIQD: 1000.0000 mL | ORAL | Status: DC

## 2017-06-30 MED ORDER — FENTANYL CITRATE (PF) 100 MCG/2ML IJ SOLN
50.0000 ug | Freq: Once | INTRAMUSCULAR | Status: AC
  Filled 2017-06-30: qty 2

## 2017-06-30 MED ORDER — STERILE WATER FOR INJECTION IJ SOLN
INTRAMUSCULAR | Status: AC
  Administered 2017-06-30: 11:00:00
  Filled 2017-06-30: qty 10

## 2017-06-30 MED ORDER — FENTANYL CITRATE (PF) 100 MCG/2ML IJ SOLN
INTRAMUSCULAR | Status: AC
  Filled 2017-06-30: qty 2

## 2017-06-30 MED ORDER — ALBUTEROL SULFATE (2.5 MG/3ML) 0.083% IN NEBU
2.5000 mg/h | INHALATION_SOLUTION | Freq: Four times a day (QID) | RESPIRATORY_TRACT | Status: DC | PRN
  Administered 2017-06-30: 2.5 mg/h via RESPIRATORY_TRACT
  Filled 2017-06-30 (×2): qty 3

## 2017-06-30 NOTE — Procedures (Signed)
Central Venous Catheter Placement: Indication: Patient receiving vesicant or irritant drug.; Patient receiving intravenous therapy for longer than 5 days.; Patient has limited or no vascular access.   Consent:emergent    Hand washing performed prior to starting the procedure.   Procedure:   An active timeout was performed and correct patient, name, & ID confirmed.   Patient was positioned correctly for central venous access.  Patient was prepped using strict sterile technique including chlorohexadine preps, sterile drape, sterile gown and sterile gloves.    The area was prepped, draped and anesthetized in the usual sterile manner. Patient comfort was obtained.    A triple lumen catheter was placed in RT  Internal Jugular Vein There was good blood return, catheter caps were placed on lumens, catheter flushed easily, the line was secured and a sterile dressing and BIO-PATCH applied.   Ultrasound was used to visualize vasculature and guidance of needle.   Number of Attempts: 1 Complications:none Estimated Blood Loss: none Chest Radiograph indicated and ordered.  Operator: Jorel Gravlin.   Atlantis Delong David Kerrigan Gombos, M.D.  [facility]  Medical Director ICU-[facility] Medical Director [facility] Cardio-Pulmonary Department     

## 2017-06-30 NOTE — Consult Note (Signed)
Name: Walter Blair MRN: [id number] DOB: Jul 15, 1946     CONSULTATION DATE: 06/27/2017  REFERRING MD :  Walter Blair  CHIEF COMPLAINT:  Restaurant failure    HISTORY OF PRESENT ILLNESS:  72 year old Walter Blair male admitted to the ICU for severe respiratory distress with severe agitation and encephalopathy After discussion with cardiologist his EF is 10% patient was noted to have A. fib with RVR was on amiodarone infusion plan was for  Electrical cardio-version today however, patient progressively declined  Patient was intubated emergently and placed on full ventilatory support  Overall prognosis is poor high risk for cardiac arrest and death  PAST MEDICAL HISTORY :   has a past medical history of CAD (coronary artery disease); Diabetes ([place]); H/O: stroke; and HTN (hypertension).  has a past surgical history that includes Coronary artery bypass graft and Cardiac defibrillator placement. Prior to Admission medications   Medication Sig Start Date End Date Taking? Authorizing Provider  atorvastatin (LIPITOR) 80 MG tablet Take 80 mg by mouth daily.   Yes [provider]  carvedilol (COREG) 3.125 MG tablet Take 3.125 mg by mouth 2 (two) times daily.   Yes [provider]  folic acid (FOLVITE) 1 MG tablet Take 1 mg by mouth every morning.   Yes [provider]  furosemide (LASIX) 20 MG tablet Take 20 mg by mouth daily.   Yes [provider]  lisinopril (PRINIVIL,ZESTRIL) 5 MG tablet Take 5 mg by mouth every morning.   Yes [provider]  metFORMIN (GLUCOPHAGE-XR) 500 MG 24 hr tablet Take 500-1,000 mg by mouth 2 (two) times daily. 500mg  in the morning and 1000mg  at night   Yes [provider]  RA ASPIRIN EC ADULT LOW ST 81 MG EC tablet Take 81 mg by mouth daily.   Yes [provider]   No Known Allergies  FAMILY HISTORY:  family history includes Diabetes in his brother and sister; Hypertension in his mother. SOCIAL HISTORY:  reports that he has never smoked. He has never used smokeless tobacco. He reports that he does not drink alcohol or use drugs.  REVIEW OF SYSTEMS:   Unable to obtain review of systems due to critical illness    VITAL SIGNS: Temp:  [97.9 F (36.6 C)-98.6 F (37 C)] 97.9 F (36.6 C) (10/11 0822) Pulse Rate:  [96-172] 99 (10/11 0822) Resp:  [18-24] 24 (10/11 0822) BP: (101-127)/(64-93) 120/93 (10/11 0822) SpO2:  [92 %-100 %] 94 % (10/11 9509)  Physical Examination:  GENERAL:critically ill appearing, +resp distress HEAD: Normocephalic, atraumatic.  EYES: Pupils equal, round, reactive to light.  No scleral icterus.  MOUTH: Moist mucosal membrane. NECK: Supple. No thyromegaly. No nodules. No JVD.  PULMONARY: +rhonchi, +wheezing CARDIOVASCULAR: S1 and S2. Regular rate and rhythm. No murmurs, rubs, or gallops.  GASTROINTESTINAL: Soft, nontender, -distended. No masses. Positive bowel sounds. No hepatosplenomegaly.  MUSCULOSKELETAL: No swelling, clubbing, or edema.  NEUROLOGIC: severe agitation SKIN:intact,warm,dry        Recent Labs Lab 06/27/17 2152 06/28/17 0435 06/30/17 0157  NA 139 141 138  K 4.5 3.5 3.9  CL 103 108 102  CO2 27 28 26   BUN 20 17 22*  CREATININE 1.11 1.06 1.19  GLUCOSE 213* 119* 148*    Recent Labs Lab 06/27/17 2152 06/28/17 0435 06/30/17 0157  HGB 11.7* 11.0* 12.6*  HCT 35.2* 32.4* 36.6*  WBC 5.3 4.8 9.1  PLT 142* 130* 158    ASSESSMENT / PLAN:  71 year old Walter Blair male with severe CHF with an EF of  10% admitted to the ICU for severe agitation metabolic encephalopathy and acute respiratory failure from CHF exacerbation leading to acute decompensation and severe respiratory failure with ventilatory support  #1 continue ventilatory support #2 severe CHF follow up failure-follow up cardiology recommendations    Critical Care Time devoted to patient care services described in this note is 45 minutes.   Overall, patient is critically ill,  prognosis is guarded.  Patient with Multiorgan failure and at high risk for cardiac arrest and death.    Walter Blair, M.D.  [facility]  Medical Director [place] Director [facility] Cardio-Pulmonary Department

## 2017-06-30 NOTE — Care Management (Signed)
Patient now in ICU status (around 10AM today) requiring intubation.

## 2017-06-30 NOTE — Consult Note (Addendum)
[place] for heparin Indication: atrial fibrillation  No Known Allergies  Patient Measurements: Height: _0  (165.1 cm) Weight: 131 lb (59.4 kg) IBW/kg (Calculated) : 61.5 Heparin Dosing Weight: 59.4 kg  Vital Signs: Temp: 98.5 F (36.9 C) (10/11 1630) Temp Source: Oral (10/11 1630) BP: 102/73 (10/11 1900) Pulse Rate: 95 (10/11 1900)  Labs:  Recent Labs  06/27/17 2152 06/28/17 0435 06/30/17 0157 06/30/17 1240  HGB 11.7* 11.0* 12.6* 13.0  HCT 35.2* 32.4* 36.6* 39.1*  PLT 142* 130* 158 140*  CREATININE 1.11 1.06 1.19 1.29*  TROPONINI <0.03  --   --   --     Estimated Creatinine Clearance: 43.5 mL/min (A) (by C-G formula based on SCr of 1.29 mg/dL (H)).   Medical History: Past Medical History:  Diagnosis Date  . CAD (coronary artery disease)   . Diabetes ([place])   . H/O: stroke   . HTN (hypertension)     Medications:  Scheduled:  . atorvastatin  80 mg Oral Daily  . chlorhexidine gluconate (MEDLINE KIT)  15 mL Mouth Rinse BID  . fentaNYL (SUBLIMAZE) injection  25 mcg Intravenous Once  . folic acid  1 mg Oral Daily  . furosemide  40 mg Intravenous BID  . insulin aspart  2-6 Units Subcutaneous Q4H  . mouth rinse  15 mL Mouth Rinse 10 times per day  .  morphine injection  2 mg Intravenous Once  . pantoprazole (PROTONIX) IV  40 mg Intravenous Q24H  . sodium chloride flush  10-40 mL Intracatheter Q12H    Assessment: Patient is a 71 year old male found to be in afib w/ RVR. Pharmacy consulted to start anticoagulation w/ apixaban. Pt has a CHA2DS2-VASc score of 5. Patient requiring intubation 10/11 to transition to heparin drip.   Goal of Therapy:  APTT 68-109 HL 0.3-0.7 Monitor platelets by anticoagulation protocol: Yes   Plan:  Last apixaban dose last PM. Will bolus heparin 1500 units IV and begin infusion at 850 unit/hr. Will check HL and aPTT 8 hours after beginning infusion, but will likely have to dose by aPTT  initially.   [name], PharmD Clinical Pharmacit   06/30/2017,7:59 PM

## 2017-06-30 NOTE — Progress Notes (Signed)
Per Dr. Mortimer Fries, OG tube okay to use. [name]

## 2017-06-30 NOTE — Progress Notes (Signed)
[place] [facility] [place] NAME: Walter Blair    MR#:  [id number]  DATE OF BIRTH:  06-03-1946  SUBJECTIVE:   Patient became quite agitated and developed respiratory distress this morning. Patient was given some IV Ativan for his agitation but his delirium got worse. Patient given Haldol and despite that had no improvement in his agitation or delirium. Patient transferred to the intensive care unit and noted to be in severe respiratory distress secondary to pulmonary edema and heart failure and therefore emergently intubated.  REVIEW OF SYSTEMS:    Review of Systems  Unable to perform ROS: Mental acuity    Nutrition: NPO Tolerating Diet: NO Tolerating PT: Await Eval.   DRUG ALLERGIES:  No Known Allergies  VITALS:  Blood pressure 110/78, pulse 99, temperature 97.6 F (36.4 C), temperature source Oral, resp. rate 16, height 5\' 5"  (1.651 m), weight 59.4 kg (131 lb), SpO2 93 %.  PHYSICAL EXAMINATION:   Physical Exam  GENERAL:  70 y.o.-year-old patient lying in bed confused agitated and delirious.  EYES: Pupils equal, round, reactive to light and accommodation. No scleral icterus. Extraocular muscles intact.  HEENT: Head atraumatic, normocephalic. Oropharynx and nasopharynx clear.  NECK:  Supple, no jugular venous distention. No thyroid enlargement, no tenderness.  LUNGS: Good A/E b/l,  no wheezing, diffuse rales b/l, No rhonchi. + use of accessory muscles of respiration.  CARDIOVASCULAR: S1, S2 Irregular rate. No murmurs, rubs, or gallops.  ABDOMEN: Soft, nontender, nondistended. Bowel sounds present. No organomegaly or mass.  EXTREMITIES: No cyanosis, clubbing or edema b/l.    NEUROLOGIC: Cranial nerves II through XII are intact. No focal Motor or sensory deficits b/l.   PSYCHIATRIC: The patient is alert and oriented x 1. Delirious, agitated.  SKIN: No obvious rash, lesion, or ulcer.    LABORATORY PANEL:   CBC  Recent Labs Lab  06/30/17 1240  WBC 10.9*  HGB 13.0  HCT 39.1*  PLT 140*   ------------------------------------------------------------------------------------------------------------------  Chemistries   Recent Labs Lab 06/30/17 0157 06/30/17 1240  NA 138 138  K 3.9 4.1  CL 102 103  CO2 26 19*  GLUCOSE 148* 105*  BUN 22* 25*  CREATININE 1.19 1.29*  CALCIUM 8.5* 8.6*  MG 2.1  --   AST  --  1,335*  ALT  --  1,018*  ALKPHOS  --  85  BILITOT  --  2.8*   ------------------------------------------------------------------------------------------------------------------  Cardiac Enzymes  Recent Labs Lab 06/27/17 2152  TROPONINI <0.03   ------------------------------------------------------------------------------------------------------------------  RADIOLOGY:  Dg Chest Port 1 View  Result Date: 06/30/2017 CLINICAL DATA:  Status post central line placement. EXAM: PORTABLE CHEST 1 VIEW COMPARISON:  Chest x-ray from earlier same day. FINDINGS: New right IJ central line in place with tip positioned at the level of the right atrium. No pneumothorax seen. Trace pleural effusions bilaterally, stable. No new lung findings. Cardiomegaly is stable. Endotracheal tube is well positioned with tip just above the level of the carina. Enteric tube passes below the diaphragm. IMPRESSION: 1. Right IJ central line in place with tip likely in the right atrium or possibly at the cavoatrial junction. 2. Endotracheal tube well positioned with tip just above the level of the carina. 3. Enteric tube passes below the diaphragm. 4. No pneumothorax. 5. Stable cardiomegaly. 6. Probable trace bilateral pleural effusions. Electronically Signed   By: Franki Cabot M.D.   On: 06/30/2017 12:11   Dg Chest Port 1 View  Result Date: 06/30/2017 CLINICAL DATA:  Shortness of breath, coronary disease, diabetes, hypertension EXAM: PORTABLE CHEST 1 VIEW COMPARISON:  06/27/2017 FINDINGS: Mild cardiomegaly with increased vascular and  interstitial prominence and small effusions compatible with early edema. Minor basilar atelectasis. No definite focal pneumonia, collapse or consolidation. No pneumothorax. Trachea is midline. Remote coronary bypass changes noted and left subclavian defibrillator. Degenerative changes of the spine and right shoulder. Atherosclerosis of the aorta. IMPRESSION: Mild early CHF pattern Electronically Signed   By: Jerilynn Mages.  Shick M.D.   On: 06/30/2017 08:23   Dg Abd Portable 1v  Result Date: 06/30/2017 CLINICAL DATA:  Status post OG tube placement. EXAM: PORTABLE ABDOMEN - 1 VIEW COMPARISON:  None. FINDINGS: OG tube appears appropriately positioned with tip in the lower stomach or proximal duodenum. Visualized bowel gas pattern is nonobstructive. IMPRESSION: OG tube appears appropriately positioned with tip in the lower stomach or proximal duodenum. Electronically Signed   By: Franki Cabot M.D.   On: 06/30/2017 12:12     ASSESSMENT AND PLAN:   71 year old male with past medical history of coronary artery disease status post bypass, diabetes, hypertension, history of previous CVA who presented to the hospital due to dizziness and noted to be in acute atrial fibrillation with rapid ventricular response.  1. Acute respiratory failure with hypoxia-secondary to pulmonary edema and congestive heart failure. -Patient developed worsening respiratory distress this morning and therefore was transferred to the ICU and emergently intubated. -Continue vent support as per pulmonary. Continue diuresis with IV Lasix, follow I's and O's and daily weights.  2. Altered mental status/agitation-etiology unclear but suspected to be delirium secondary to sundowning combined with benzodiazepines. -Follow mental status once patient is extubated. Continue supportive care for now.  3. Atrial fibrillation with rapid ventricular response-this was the cause of patient's dizziness. -Heart rates are stable but still remains in atrial  fibrillation. Plan was for TEE cardioversion but it was suspended due to patient's respiratory distress and delirium. Echocardiogram shows severe LV dysfunction with EF of 10-15%. This is apparently new for the patient. -Continue amiodarone, metoprolol for rate control. Was on Eliquis, and switch over to heparin drip for now.  4. Abnormal LFTs-patient's AST, ALF  are over a thousand. 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 Follow LFT's.   5. CHF - acute on chronic systolic dysfunction.  - cont. Diuresis with IV lasix and follow I's and O's and daily weights.   6. Essential hypertension- bp on low side and anti-HTN on hold for now.   7. Diabetes type 2 without complication-continue sliding scale insulin.   8. History of coronary artery disease-no acute chest pain presently. - cont. Heparin gtt, Statin.     All the records are reviewed and case discussed with Care Management/Social Worker. Management plans discussed with the patient, family and they are in agreement.  CODE STATUS: Full code  DVT Prophylaxis: Eliquis  TOTAL TIME TAKING CARE OF THIS PATIENT: 40 minutes.   POSSIBLE D/C IN 3-4 DAYS, DEPENDING ON CLINICAL CONDITION.   Henreitta Leber M.D on 06/30/2017 at 3:13 PM  Between 7am to 6pm - Pager - [phone]  After 6pm go to [email or web address] - Proofreader  [facility]  Office  [phone]  CC: Primary care physician; Jodi Marble, MD

## 2017-06-30 NOTE — Care Management (Signed)
During the night, the patient became very agitated and confused.  The cardioversion that was planned for today is to be cancelled due to continued confusion and agitation.  This is not patient's baseline. head CT is negative.

## 2017-06-30 NOTE — Progress Notes (Signed)
Patient is in afib overnight, amio infusing at 16.8ml/h, denied pain but was anxious.He was kept NPO overnight for schedule cardioversion 0445: Patient was attended to after screaming out, patient was observed holding onto the bed side rail and stated that he feels like was going to fall. Patient VS was stable and blood glucose was wdl. Dr, Estanislado Pandy was informed about patient's status, no order receive at this time. Patient called his family to come to the hospital.  0530: Patient's family at the bedside, patient became more anxious.  Marland Kitchen  1121:  Per Dr. Estanislado Pandy 2mg  of Ativan was administered. Patient became delirious, hallucinating and confused and hyperventilating post ativan administration.  0640: Dr. Estanislado Pandy was at patient's bedside to talk to family and evaluate the patient.. Breathing treatment was administered  per order. 0700: Patient was sleeping with family at the bedside

## 2017-06-30 NOTE — Progress Notes (Addendum)
Patient became very agitated trying to pull IV, oxygen and get out of bed. , Unable to be redirected or calmed down despite verbal cuing. MD rounded with RN at bedside. Orders obtained for 2.5mg  IV haldol.  After giving haldol patient became progressively worse, MD notified and gave verbal order to give another 2.5mg  IV haldol. Haldol given. Patient was reassessed and PRN medication was ineffective. Patient's status declining. Becoming increasingly confused and WOB worsened.  CCMD notified primary RN of 14 beat run of Wide ORS complex. MD notified. HR currently increasing into 120's-140s in atrial fibrillation.   Orders given to transfer patient to ICU. Family aware of situation and at bedside.

## 2017-06-30 NOTE — Progress Notes (Signed)
Initial Nutrition Assessment  DOCUMENTATION CODES:   Non-severe (moderate) malnutrition in context of chronic illness  INTERVENTION:  Recommend initiating Vital AF 1.2 at 20 ml/hr and advancing by 15 ml/hr every 8 hours to goal regimen of Vital AF 1.2 at 50 ml/hr (1200 ml goal daily volume) via OGT. Provides 1440 kcal, 90 grams of protein, 972 ml H2O daily.  Recommend close monitoring of electrolytes (potassium, phosphorus, magnesium) with advancement of tube feeds.  Recommend providing minimum free water flush on pump of 30 ml Q4hrs to prevent clogging of tube as patient has severe CHF with EF 10%.  NUTRITION DIAGNOSIS:   Malnutrition (Moderate) related to chronic illness (CHF, decreased intake over the past year) as evidenced by moderate depletion of body fat, mild depletion of muscle mass, moderate depletions of muscle mass.  GOAL:   Provide needs based on ASPEN/SCCM guidelines  MONITOR:   Vent status, Labs, Weight trends, TF tolerance, I & O's  REASON FOR ASSESSMENT:   Ventilator    ASSESSMENT:   71 year old male with PMHx of HTN, DM, CAD s/p CABG, hx of CVA, hx of cardiac defibrillator placement, severe CHF (EF 10%), A-fib with RVR, who is now admitted to ICU and was emergently intubated 10/11 following decline prior to scheduled electrical cardio-version.   Spoke with patient's nephew at bedside. He reports that patient has not been eating very well the past year and has lost some weight. His wife recently passed away, but even before she passed he had not been eating as much because she was also not eating very much. Nephew believes he has lost approximately 20 lbs (13.2% body weight) sometime over the past year, which is not significant for time frame.  Access: OGT placed 10/11; tip confirmed to be in lower stomach or proximal duodenum; 64 cm at corner of mouth  MAP: 89-100 mmHg; patient is not on any pressors at this time  Patient is currently intubated on ventilator  support MV: 8.1 L/min (obtained in room as it is not documented in chart at this time) Temp (24hrs), Avg:98.1 F (36.7 C), Min:97.6 F (36.4 C), Max:98.6 F (37 C)  Propofol: N/A  Medications reviewed and include: folic acid 1 mg daily, Lasix 40 mg BID, Novolog 2-6 units Q4hrs, pantoprazole, NS @ 50 ml/hr, amiodarone gtt, fentanyl gtt, heparin.  Labs reviewed: CBG 90-143, CO2 19, BUN 25, Creatinine 1.29, Anion gap 16, AST 1335, ALT 1018, T Bili 2.8.  Nutrition-Focused physical exam completed. Findings are moderate-severe fat depletion (moderate depletion of orbital region and thoracic/lumbar region; severe depletion of upper arm region), mild-moderate muscle depletion (mild depletion of temple region and posterior calf region; moderate depletion of clavicle bone region, clavicle/acromion bone region, dorsal hand region, patellar region, anterior thigh region), and no edema.   Discussed with RN. Plan is to begin tube feedings today.  Diet Order:  Diet NPO time specified  Skin:  Reviewed, no issues  Last BM:  06/29/2017 per chart (stool chart not documented)  Height:   Ht Readings from Last 1 Encounters:  06/28/17 5\' 5"  (1.651 m)    Weight:   Wt Readings from Last 1 Encounters:  06/28/17 131 lb (59.4 kg)    Ideal Body Weight:  61.8 kg  BMI:  Body mass index is 21.8 kg/m.  Estimated Nutritional Needs:   Kcal:  1443 (PSU 2003b w/ MSJ 1276, Ve 8.1, Tmax 37)  Protein:  77-89 grams (1.3-1.5 grams/kg)  Fluid:  1.5 L/day (25 ml/kg)  EDUCATION NEEDS:  No education needs identified at this time  Willey Blade, [place], [place], [place] Office: [phone] Pager: [phone] After Hours/Weekend Pager: [phone]

## 2017-06-30 NOTE — Progress Notes (Signed)
Called by RN taking care of the patient. Patient confused this am. Patient trying to get out of bed. Not completely oriented to time, place and person. Vitals stable. Has been anxious over night. Patient seen and evaluated. Looks like a panic attack with increased anxiety.Has wheezing in both lung fields. Ativan IV one dose given. Patient calmed down and resting comfortably.Reassured patient family. Due for procedure this am for cardioversion by cardiology.

## 2017-06-30 NOTE — Progress Notes (Signed)
SUBJECTIVE: Patient became very combative after receiving Ativan last night and procedure was canceled.   Vitals:   06/30/17 0456 06/30/17 0624 06/30/17 0709 06/30/17 0822  BP: 126/65   (!) 120/93  Pulse: (!) 172 (!) 110  99  Resp:    (!) 24  Temp:    97.9 F (36.6 C)  TempSrc:    Oral  SpO2: 98% 97% 92% 94%  Weight:      Height:        Intake/Output Summary (Last 24 hours) at 06/30/17 0838 Last data filed at 06/30/17 0100  Gross per 24 hour  Intake           417.41 ml  Output                0 ml  Net           417.41 ml    LABS: Basic Metabolic Panel:  Recent Labs  06/28/17 0435 06/30/17 0157  NA 141 138  K 3.5 3.9  CL 108 102  CO2 28 26  GLUCOSE 119* 148*  BUN 17 22*  CREATININE 1.06 1.19  CALCIUM 8.5* 8.5*  MG  --  2.1   Liver Function Tests: No results for input(s): AST, ALT, ALKPHOS, BILITOT, PROT, ALBUMIN in the last 72 hours. No results for input(s): LIPASE, AMYLASE in the last 72 hours. CBC:  Recent Labs  06/28/17 0435 06/30/17 0157  WBC 4.8 9.1  HGB 11.0* 12.6*  HCT 32.4* 36.6*  MCV 85.4 84.9  PLT 130* 158   Cardiac Enzymes:  Recent Labs  06/27/17 2152  TROPONINI <0.03   BNP: Invalid input(s): POCBNP D-Dimer: No results for input(s): DDIMER in the last 72 hours. Hemoglobin A1C: No results for input(s): HGBA1C in the last 72 hours. Fasting Lipid Panel: No results for input(s): CHOL, HDL, LDLCALC, TRIG, CHOLHDL, LDLDIRECT in the last 72 hours. Thyroid Function Tests: No results for input(s): TSH, T4TOTAL, T3FREE, THYROIDAB in the last 72 hours.  Invalid input(s): FREET3 Anemia Panel: No results for input(s): VITAMINB12, FOLATE, FERRITIN, TIBC, IRON, RETICCTPCT in the last 72 hours.   PHYSICAL EXAM General: Well developed, well nourished, in no acute distress HEENT:  Normocephalic and atramatic Neck:  No JVD.  Lungs: Clear bilaterally to auscultation and percussion. Heart: HRRR . Normal S1 and S2 without gallops or murmurs.   Abdomen: Bowel sounds are positive, abdomen soft and non-tender  Msk:  Back normal, normal gait. Normal strength and tone for age. Extremities: No clubbing, cyanosis or edema.   Neuro: Alert and oriented X 3. Psych:  Good affect, responds appropriately  TELEMETRY:A. fib with 95 bpm  ASSESSMENT AND PLAN: Atrial fibrillation with rapid ventricular response rate and echocardiogram showing four-chamber dilatation with left ventricle ejection fraction 10-18%. Patient became combative last night and was desaturating in the holding area and anesthesiologist cancel the TEE and cardioversion because of risk of intubation. Patient was very confused thus procedure was canceled. Patient had ejection fraction 45% 6 months back and now has 18%. Will discontinue lisinopril by mouth Lasix and Coreg and changed to metoprolol and IV Lasix and and entersto. We will reassess whether patient can undergo TEE and cardioversion. We will consider doing it tomorrow. Chances of remaining in sinus rhythm on low because of severe LV dysfunction.  Principal Problem:   Atrial fibrillation with RVR (HCC) Active Problems:   Diabetes (HCC)   HTN (hypertension)   CAD (coronary artery disease)    Walter David, MD, [facility] 06/30/2017 8:38  AM     

## 2017-06-30 NOTE — Progress Notes (Signed)
Per Dr. Mortimer Fries, okay to use central line. [name]

## 2017-06-30 NOTE — Progress Notes (Signed)
Upon assessment patient very tachypneic despite breathing treatment. Patient was taken down to specials to be evaluated for possible cardioversion earlier. Cardioversion placed on hold d/t patient decline. MD notified.

## 2017-06-30 NOTE — Procedures (Signed)
Endotracheal Intubation: Patient required placement of an artificial airway secondary to Respiratory Failure  Consent: Emergent.   Hand washing performed prior to starting the procedure.   Medications administered for sedation prior to procedure:  Midazolam 4 mg IV,  Vecuronium 10 mg IV, Fentanyl 100 mcg IV.    A time out procedure was called and correct patient, name, & ID confirmed. Needed supplies and equipment were assembled and checked to include ETT, 10 ml syringe, Glidescope, Mac and Miller blades, suction, oxygen and bag mask valve, end tidal CO2 monitor.   Patient was positioned to align the mouth and pharynx to facilitate visualization of the glottis.   Heart rate, SpO2 and blood pressure was continuously monitored during the procedure. Pre-oxygenation was conducted prior to intubation and endotracheal tube was placed through the vocal cords into the trachea.     The artificial airway was placed under direct visualization via glidescope route using a 8.5 ETT on the first attempt.  ETT was secured at 23 cm mark.  Placement was confirmed by auscuitation of lungs with good breath sounds bilaterally and no stomach sounds.  Condensation was noted on endotracheal tube.   Pulse ox 98%.  CO2 detector in place with appropriate color change.   Complications: None .   Operator: Romelle Reiley.   Chest radiograph ordered and pending.   Comments: OGT placed via glidescope.  Skylah Delauter David Jernard Reiber, M.D.  [facility]  Medical Director ICU-[facility] [place] Medical Director [facility] Cardio[facility] Department       

## 2017-06-30 NOTE — Consult Note (Signed)
ANTICOAGULATION CONSULT NOTE  Pharmacy Consult for heparin Indication: atrial fibrillation  No Known Allergies  Patient Measurements: Height: _0  (165.1 cm) Weight: 131 lb (59.4 kg) IBW/kg (Calculated) : 61.5 Heparin Dosing Weight: 59.4 kg  Vital Signs: Temp: 98.4 F (36.9 C) (10/11 2100) Temp Source: Oral (10/11 2100) BP: 113/80 (10/11 2200) Pulse Rate: 110 (10/11 2200)  Labs:  Recent Labs  06/28/17 0435 06/30/17 0157 06/30/17 1240 06/30/17 2203  HGB 11.0* 12.6* 13.0  --   HCT 32.4* 36.6* 39.1*  --   PLT 130* 158 140*  --   APTT  --   --   --  >160*  HEPARINUNFRC  --   --   --  2.00*  CREATININE 1.06 1.19 1.29*  --     Estimated Creatinine Clearance: 43.5 mL/min (A) (by C-G formula based on SCr of 1.29 mg/dL (H)).   Medical History: Past Medical History:  Diagnosis Date  . CAD (coronary artery disease)   . Diabetes ([place])   . H/O: stroke   . HTN (hypertension)     Medications:  Scheduled:  . atorvastatin  80 mg Oral Daily  . chlorhexidine gluconate (MEDLINE KIT)  15 mL Mouth Rinse BID  . fentaNYL (SUBLIMAZE) injection  25 mcg Intravenous Once  . folic acid  1 mg Oral Daily  . furosemide  40 mg Intravenous BID  . insulin aspart  2-6 Units Subcutaneous Q4H  . mouth rinse  15 mL Mouth Rinse 10 times per day  .  morphine injection  2 mg Intravenous Once  . pantoprazole (PROTONIX) IV  40 mg Intravenous Q24H  . sodium chloride flush  10-40 mL Intracatheter Q12H    Assessment: Patient is a 71 year old male found to be in afib w/ RVR. Pharmacy consulted to start anticoagulation w/ apixaban. Pt has a CHA2DS2-VASc score of 5. Patient requiring intubation 10/11 to transition to heparin drip.   Goal of Therapy:  APTT 68-109 HL 0.3-0.7 Monitor platelets by anticoagulation protocol: Yes   Plan:  Last apixaban dose last PM. Will bolus heparin 1500 units IV and begin infusion at 850 unit/hr. Will check HL and aPTT 8 hours after beginning infusion, but will  likely have to dose by aPTT initially.   10/11 @ 2200 HL 2.00, aPTT > 160 both supratherapeutic. Not sure if aPTT was drawn from heparin line while heparin was running, but will pause drip for 1 hour and will restart @ 700 units/hr and will recheck HL/aPTT @ 0900. Per RN patient is not bleeding.  Tobie Lords, PharmD, BCPS Clinical Pharmacist 06/30/2017

## 2017-06-30 NOTE — Progress Notes (Signed)
Patient was transferred to ICU and intubated. Report given to [place]. Family waiting in the waiting room.

## 2017-07-01 ENCOUNTER — Encounter: Payer: Self-pay | Admitting: Anesthesiology

## 2017-07-01 ENCOUNTER — Encounter
Admission: EM | Disposition: A | Discharge status: Home Health | Payer: Self-pay | Source: Home / Self Care | Attending: Internal Medicine

## 2017-07-01 ENCOUNTER — Inpatient Hospital Stay: Payer: Medicare Other

## 2017-07-01 HISTORY — PX: TEE WITHOUT CARDIOVERSION: SHX5443

## 2017-07-01 HISTORY — PX: CARDIOVERSION: EP1203

## 2017-07-01 LAB — GLUCOSE, CAPILLARY
GLUCOSE-CAPILLARY: 120 mg/dL — AB (ref 65–99)
GLUCOSE-CAPILLARY: 150 mg/dL — AB (ref 65–99)
GLUCOSE-CAPILLARY: 166 mg/dL — AB (ref 65–99)
GLUCOSE-CAPILLARY: 95 mg/dL (ref 65–99)
Glucose-Capillary: 115 mg/dL — ABNORMAL HIGH (ref 65–99)

## 2017-07-01 LAB — COMPREHENSIVE METABOLIC PANEL
ALBUMIN: 3.3 g/dL — AB (ref 3.5–5.0)
ALT: 1119 U/L — ABNORMAL HIGH (ref 17–63)
ANION GAP: 7 (ref 5–15)
AST: 1120 U/L — ABNORMAL HIGH (ref 15–41)
Alkaline Phosphatase: 72 U/L (ref 38–126)
BILIRUBIN TOTAL: 2.6 mg/dL — AB (ref 0.3–1.2)
BUN: 26 mg/dL — ABNORMAL HIGH (ref 6–20)
CHLORIDE: 105 mmol/L (ref 101–111)
CO2: 27 mmol/L (ref 22–32)
Calcium: 8 mg/dL — ABNORMAL LOW (ref 8.9–10.3)
Creatinine, Ser: 1.02 mg/dL (ref 0.61–1.24)
GFR calc Af Amer: >60 mL/min (ref >60)
GLUCOSE: 128 mg/dL — AB (ref 65–99)
POTASSIUM: 3.3 mmol/L — AB (ref 3.5–5.1)
Sodium: 139 mmol/L (ref 135–145)
TOTAL PROTEIN: 6.2 g/dL — AB (ref 6.5–8.1)

## 2017-07-01 LAB — CBC
HEMATOCRIT: 36.3 % — AB (ref 40.0–52.0)
HEMOGLOBIN: 12.2 g/dL — AB (ref 13.0–18.0)
MCH: 29 pg (ref 26.0–34.0)
MCHC: 33.7 g/dL (ref 32.0–36.0)
MCV: 86 fL (ref 80.0–100.0)
Platelets: 117 10*3/uL — ABNORMAL LOW (ref 150–440)
RBC: 4.22 MIL/uL — AB (ref 4.40–5.90)
RDW: 15.3 % — ABNORMAL HIGH (ref 11.5–14.5)
WBC: 8.2 10*3/uL (ref 3.8–10.6)

## 2017-07-01 LAB — APTT
APTT: 107 s — AB (ref 24–36)
APTT: 93 s — AB (ref 24–36)

## 2017-07-01 LAB — BRAIN NATRIURETIC PEPTIDE: B NATRIURETIC PEPTIDE 5: 344 pg/mL — AB (ref 0.0–100.0)

## 2017-07-01 LAB — HEPARIN LEVEL (UNFRACTIONATED): Heparin Unfractionated: 2.2 IU/mL — ABNORMAL HIGH (ref 0.30–0.70)

## 2017-07-01 SURGERY — ECHOCARDIOGRAM, TRANSESOPHAGEAL
Anesthesia: General

## 2017-07-01 MED ORDER — DEXMEDETOMIDINE HCL IN NACL 400 MCG/100ML IV SOLN
0.4000 ug/kg/h | INTRAVENOUS | Status: DC
  Administered 2017-07-01: 0.4 ug/kg/h via INTRAVENOUS
  Administered 2017-07-01: 0.7 ug/kg/h via INTRAVENOUS
  Administered 2017-07-02: 1.2 ug/kg/h via INTRAVENOUS
  Filled 2017-07-01 (×4): qty 100

## 2017-07-01 MED ORDER — ORAL CARE MOUTH RINSE
15.0000 mL | Freq: Two times a day (BID) | OROMUCOSAL | Status: DC
  Administered 2017-07-01 – 2017-07-02 (×2): 15 mL via OROMUCOSAL

## 2017-07-01 MED ORDER — SENNOSIDES-DOCUSATE SODIUM 8.6-50 MG PO TABS: 1.0000 | ORAL_TABLET | Freq: Two times a day (BID) | ORAL | Status: DC

## 2017-07-01 MED ORDER — FOLIC ACID 1 MG PO TABS
1.0000 mg | ORAL_TABLET | Freq: Every day | ORAL | Status: DC
  Administered 2017-07-03 – 2017-07-06 (×4): 1 mg
  Filled 2017-07-01 (×4): qty 1

## 2017-07-01 MED ORDER — POTASSIUM CHLORIDE 10 MEQ/50ML IV SOLN
10.0000 meq | INTRAVENOUS | Status: AC
  Administered 2017-07-01 (×2): 10 meq via INTRAVENOUS
  Filled 2017-07-01 (×2): qty 50

## 2017-07-01 MED ORDER — POTASSIUM CHLORIDE 20 MEQ PO PACK: 40.0000 meq | PACK | ORAL | Status: DC

## 2017-07-01 MED ORDER — SODIUM CHLORIDE 0.9 % IV SOLN: INTRAVENOUS | Status: DC

## 2017-07-01 MED ORDER — PANTOPRAZOLE SODIUM 40 MG PO PACK: 40.0000 mg | PACK | Freq: Every day | ORAL | Status: DC

## 2017-07-01 MED ORDER — DOCUSATE SODIUM 50 MG/5ML PO LIQD
100.0000 mg | Freq: Two times a day (BID) | ORAL | Status: DC
  Administered 2017-07-01: 100 mg via ORAL
  Filled 2017-07-01: qty 10

## 2017-07-01 MED ORDER — SENNOSIDES 8.8 MG/5ML PO SYRP
5.0000 mL | ORAL_SOLUTION | Freq: Two times a day (BID) | ORAL | Status: DC
  Administered 2017-07-01: 5 mL via ORAL
  Filled 2017-07-01 (×2): qty 5

## 2017-07-01 NOTE — Progress Notes (Signed)
CONCERNING: IV to Oral Route Change Policy  RECOMMENDATION: This patient is receiving pantoprazole by the intravenous route.  Based on criteria approved by the Pharmacy and Therapeutics Committee, the intravenous medication(s) is/are being converted to the equivalent oral dose form(s).   DESCRIPTION: These criteria include:  The patient is eating (either orally or via tube) and/or has been taking other orally administered medications for a least 24 hours  The patient has no evidence of active gastrointestinal bleeding or impaired GI absorption (gastrectomy, short bowel, patient on TNA or NPO).  If you have questions about this conversion, please contact the Pharmacy Department  []   [phone] )  [facility] [x]   [phone] )  [facility] []   [phone] )  [facility] []   [phone] )  [facility] []   [phone] )  [place], [facility] 07/01/2017 5:05 PM

## 2017-07-01 NOTE — Consult Note (Signed)
ANTICOAGULATION CONSULT NOTE  Pharmacy Consult for heparin Indication: atrial fibrillation  No Known Allergies  Patient Measurements: Height: 5\' 5"  (165.1 cm) Weight: 126 lb 5.2 oz (57.3 kg) IBW/kg (Calculated) : 61.5 Heparin Dosing Weight: 59.4 kg  Vital Signs: Temp: 97.9 F (36.6 C) (10/12 1956) Temp Source: Oral (10/12 1956) BP: 112/72 (10/12 1800) Pulse Rate: 97 (10/12 1800)  Labs:  Recent Labs  06/30/17 0157 06/30/17 1240 06/30/17 2203 07/01/17 0615 07/01/17 1000 07/01/17 1808  HGB 12.6* 13.0  --  12.2*  --   --   HCT 36.6* 39.1*  --  36.3*  --   --   PLT 158 140*  --  117*  --   --   APTT  --   --  >160*  --  107* 93*  HEPARINUNFRC  --   --  2.00*  --  2.20*  --   CREATININE 1.19 1.29*  --   --  1.02  --     Estimated Creatinine Clearance: 53.1 mL/min (by C-G formula based on SCr of 1.02 mg/dL).   Medical History: Past Medical History:  Diagnosis Date  . CAD (coronary artery disease)   . Diabetes ([place])   . H/O: stroke   . HTN (hypertension)     Medications:  Scheduled:  . atorvastatin  80 mg Oral Daily  . [START ON 82/50/5397] folic acid  1 mg Per Tube Daily  . furosemide  40 mg Intravenous BID  . insulin aspart  2-6 Units Subcutaneous Q4H  . mouth rinse  15 mL Mouth Rinse BID  . [START ON 07/02/2017] pantoprazole sodium  40 mg Per Tube Daily  . sodium chloride flush  10-40 mL Intracatheter Q12H    Assessment: Patient is a 71 year old male found to be in afib w/ RVR. Pharmacy consulted to start anticoagulation w/ apixaban. Pt has a CHA2DS2-VASc score of 5. Patient requiring intubation 10/11 to transition to heparin drip.   Goal of Therapy:  APTT 68-109 HL 0.3-0.7 Monitor platelets by anticoagulation protocol: Yes   Plan:  Continue heparin infusion at current rate and f/u AM labs.   [name], PharmD Clinical Pharmacist  07/01/2017

## 2017-07-01 NOTE — Progress Notes (Signed)
[facility] [place] NAME: Russel Morain    MR#:  [id number]  DATE OF BIRTH:  12-13-1945  SUBJECTIVE:   Patient transferred to the intensive care unit due to respiratory failure. Remains intubated and sedated. Diuresed well with IV Lasix. Remains on amiodarone drip and heart rates are controlled. No other acute events overnight. Patient's brother is at bedside and discussed plan of care with him.  REVIEW OF SYSTEMS:    Review of Systems  Unable to perform ROS: Mental acuity    Nutrition: NPO Tolerating Diet: NO Tolerating PT: Await Eval.   DRUG ALLERGIES:  No Known Allergies  VITALS:  Blood pressure 96/69, pulse 82, temperature 97.8 F (36.6 C), temperature source Oral, resp. rate (!) 22, height 5\' 5"  (1.651 m), weight 57.3 kg (126 lb 5.2 oz), SpO2 97 %.  PHYSICAL EXAMINATION:   Physical Exam  GENERAL:  71 y.o.-year-old patient lying in bed Intubated and sedated.  EYES: Pupils equal, round, reactive to light. No scleral icterus.  HEENT: Head atraumatic, normocephalic. Endotracheal and orogastric tubes in place. NECK:  Supple, no jugular venous distention. No thyroid enlargement, no tenderness.  LUNGS: Good air entry bilaterally, no rales, rhonchi, wheezing. Negative use of accessory muscles.  CARDIOVASCULAR: S1, S2 Irregular rate. No murmurs, rubs, or gallops.  ABDOMEN: Soft, nontender, nondistended. Bowel sounds present. No organomegaly or mass.  EXTREMITIES: No cyanosis, clubbing or edema b/l.    NEUROLOGIC: Sedated and intubated. PSYCHIATRIC: Patient sedated and intubated SKIN: No obvious rash, lesion, or ulcer.    LABORATORY PANEL:   CBC  Recent Labs Lab 07/01/17 0615  WBC 8.2  HGB 12.2*  HCT 36.3*  PLT 117*   ------------------------------------------------------------------------------------------------------------------  Chemistries   Recent Labs Lab 06/30/17 1443 07/01/17 1000  NA  --  139  K  --  3.3*   CL  --  105  CO2  --  27  GLUCOSE  --  128*  BUN  --  26*  CREATININE  --  1.02  CALCIUM  --  8.0*  MG 1.9  --   AST  --  1,120*  ALT  --  1,119*  ALKPHOS  --  72  BILITOT  --  2.6*   ------------------------------------------------------------------------------------------------------------------  Cardiac Enzymes  Recent Labs Lab 06/27/17 2152  TROPONINI <0.03   ------------------------------------------------------------------------------------------------------------------  RADIOLOGY:  Dg Chest Port 1 View  Result Date: 07/01/2017 CLINICAL DATA:  Acute respiratory failure. EXAM: PORTABLE CHEST 1 VIEW COMPARISON:  06/30/2017. FINDINGS: Endotracheal tube in satisfactory position. Stable left subclavian AICD lead. Right jugular catheter tip in the mid right atrium. Nasogastric tube extending into the distal stomach. Stable post CABG changes. Stable enlarged cardiac silhouette. Increased bilateral pleural fluid and ill-defined density at the right lung base and more confluent density at the left lung base. The interstitial markings are mildly more prominent. Mild right shoulder degenerative changes. IMPRESSION: 1. Interstitial pulmonary edema with increased bilateral pleural fluid. 2. Increased bibasilar atelectasis with interval dense left lower lobe atelectasis or pneumonia. 3. Stable cardiomegaly. 4. Right jugular catheter tip in the mid right atrium. This could be retracted 4 cm to place it in the inferior aspect of the superior vena cava. Electronically Signed   By: Claudie Revering M.D.   On: 07/01/2017 13:21   Dg Chest Port 1 View  Result Date: 06/30/2017 CLINICAL DATA:  Status post central line placement. EXAM: PORTABLE CHEST 1 VIEW COMPARISON:  Chest x-ray from earlier same day. FINDINGS: New right  IJ central line in place with tip positioned at the level of the right atrium. No pneumothorax seen. Trace pleural effusions bilaterally, stable. No new lung findings. Cardiomegaly is  stable. Endotracheal tube is well positioned with tip just above the level of the carina. Enteric tube passes below the diaphragm. IMPRESSION: 1. Right IJ central line in place with tip likely in the right atrium or possibly at the cavoatrial junction. 2. Endotracheal tube well positioned with tip just above the level of the carina. 3. Enteric tube passes below the diaphragm. 4. No pneumothorax. 5. Stable cardiomegaly. 6. Probable trace bilateral pleural effusions. Electronically Signed   By: Franki Cabot M.D.   On: 06/30/2017 12:11   Dg Chest Port 1 View  Result Date: 06/30/2017 CLINICAL DATA:  Shortness of breath, coronary disease, diabetes, hypertension EXAM: PORTABLE CHEST 1 VIEW COMPARISON:  06/27/2017 FINDINGS: Mild cardiomegaly with increased vascular and interstitial prominence and small effusions compatible with early edema. Minor basilar atelectasis. No definite focal pneumonia, collapse or consolidation. No pneumothorax. Trachea is midline. Remote coronary bypass changes noted and left subclavian defibrillator. Degenerative changes of the spine and right shoulder. Atherosclerosis of the aorta. IMPRESSION: Mild early CHF pattern Electronically Signed   By: Jerilynn Mages.  Shick M.D.   On: 06/30/2017 08:23   Dg Abd Portable 1v  Result Date: 06/30/2017 CLINICAL DATA:  Status post OG tube placement. EXAM: PORTABLE ABDOMEN - 1 VIEW COMPARISON:  None. FINDINGS: OG tube appears appropriately positioned with tip in the lower stomach or proximal duodenum. Visualized bowel gas pattern is nonobstructive. IMPRESSION: OG tube appears appropriately positioned with tip in the lower stomach or proximal duodenum. Electronically Signed   By: Franki Cabot M.D.   On: 06/30/2017 12:12     ASSESSMENT AND PLAN:   71 year old male with past medical history of coronary artery disease status post bypass, diabetes, hypertension, history of previous CVA who presented to the hospital due to dizziness and noted to be in acute  atrial fibrillation with rapid ventricular response.  1. Acute respiratory failure with hypoxia-secondary to pulmonary edema and congestive heart failure. -Patient developed worsening respiratory distress yesterday morning and therefore was transferred to the ICU and emergently intubated. -Continue vent support as per pulmonary. Continue diuresis with IV Lasix, and responded well to Lasix and made about 3 L of urine and about 1 L (-) since yesterday.   2. Altered mental status/agitation-etiology unclear but suspected to be delirium secondary to sundowning combined with benzodiazepines. -Follow mental status once patient is extubated. Continue supportive care for now.  3. Atrial fibrillation with rapid ventricular response-this was the cause of patient's dizziness on admission. - Plan was for TEE cardioversion but it was suspended due to patient's respiratory distress and delirium. Echocardiogram shows severe LV dysfunction with EF of 10-15%. This is apparently new for the patient. -Continue amiodarone, metoprolol, cont. Heparin gtt.   4. Abnormal LFTs-patient's AST, ALF continued to be elevated.  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 Follow LFT's.  - ?? Shock liver and if not improving consider abdominal US.   5. CHF - acute on chronic systolic dysfunction.  - cont. Diuresis with IV lasix responding well and about 1 L (-) since yesterday.   6. Essential hypertension- bp on low side and anti-HTN on hold for now.   7. Diabetes type 2 without complication-continue sliding scale insulin.   8. History of coronary artery disease-no acute chest pain presently. - cont. Heparin gtt, Statin.     All the  records are reviewed and case discussed with Care Management/Social Worker. Management plans discussed with the patient, family and they are in agreement.  CODE STATUS: Full code  DVT Prophylaxis: Eliquis  TOTAL TIME TAKING CARE OF THIS PATIENT: 30 minutes.    POSSIBLE D/C IN 3-4 DAYS, DEPENDING ON CLINICAL CONDITION.   Henreitta Leber M.D on 07/01/2017 at 3:23 PM  Between 7am to 6pm - Pager - [phone]  After 6pm go to [email or web address] - Proofreader  [facility]  Office  [phone]  CC: Primary care physician; Jodi Marble, MD

## 2017-07-01 NOTE — Progress Notes (Signed)
SUBJECTIVE: Pt is unresponsive to questions, though thrashing against ventilator and trying to sit up. He was held still by 3 RNs.    Vitals:   07/01/17 0630 07/01/17 0700 07/01/17 0800 07/01/17 0837  BP: 105/88 112/77 116/78   Pulse: 90 85 (!) 111   Resp: 16 16 17    Temp:   98.2 F (36.8 C)   TempSrc:   Oral   SpO2: 99% 99% 98% 99%  Weight:      Height:        Intake/Output Summary (Last 24 hours) at 07/01/17 0959 Last data filed at 07/01/17 0800  Gross per 24 hour  Intake          1006.94 ml  Output             3445 ml  Net         -2438.06 ml    LABS: Basic Metabolic Panel:  Recent Labs  06/30/17 0157 06/30/17 1240 06/30/17 1443  NA 138 138  --   K 3.9 4.1  --   CL 102 103  --   CO2 26 19*  --   GLUCOSE 148* 105*  --   BUN 22* 25*  --   CREATININE 1.19 1.29*  --   CALCIUM 8.5* 8.6*  --   MG 2.1  --  1.9  PHOS  --   --  3.6   Liver Function Tests:  Recent Labs  06/30/17 1240  AST 1,335*  ALT 1,018*  ALKPHOS 85  BILITOT 2.8*  PROT 6.9  ALBUMIN 3.9   No results for input(s): LIPASE, AMYLASE in the last 72 hours. CBC:  Recent Labs  06/30/17 1240 07/01/17 0615  WBC 10.9* 8.2  HGB 13.0 12.2*  HCT 39.1* 36.3*  MCV 86.2 86.0  PLT 140* 117*   Cardiac Enzymes: No results for input(s): CKTOTAL, CKMB, CKMBINDEX, TROPONINI in the last 72 hours. BNP: Invalid input(s): POCBNP D-Dimer: No results for input(s): DDIMER in the last 72 hours. Hemoglobin A1C: No results for input(s): HGBA1C in the last 72 hours. Fasting Lipid Panel: No results for input(s): CHOL, HDL, LDLCALC, TRIG, CHOLHDL, LDLDIRECT in the last 72 hours. Thyroid Function Tests: No results for input(s): TSH, T4TOTAL, T3FREE, THYROIDAB in the last 72 hours.  Invalid input(s): FREET3 Anemia Panel: No results for input(s): VITAMINB12, FOLATE, FERRITIN, TIBC, IRON, RETICCTPCT in the last 72 hours.   PHYSICAL EXAM General: Intubated, thrashing violently HEENT:  ET tube in  place Lungs: Clear Heart: AFib.  Abdomen:  Msk:  Thrashing irrationally Extremities: No clubbing, cyanosis or edema.   Neuro: sedated, though thrashing Psych:  Sedated, unresponsive  TELEMETRY: Afib 92bpm  ASSESSMENT AND PLAN: New onset atrial fibrillation currently rate controlled at 92bpm with severe left ventricular dysfunction EF 10-15% and severe mitral and tricuspid regurgitation. Pt's condition rapidly deteriorated yesterday and he now dependent on a ventilator and TEE/cardioversion needed to be cancelled. His prognosis is poor. Pt's brother, who has power of attorney, is at bedside and the pt's status was discussed. He states his brother would not want to be intubated and he would like to discuss end of life care with the critical care physician. No changes to treatment at this time.   Principal Problem:   New onset atrial fibrillation (HCC) Active Problems:   Diabetes ([place])   HTN (hypertension)   CAD (coronary artery disease)   Dizziness   SOB (shortness of breath)   Encounter for central line placement    Jake Bathe,  NP-C 07/01/2017 9:59 AM

## 2017-07-01 NOTE — Progress Notes (Signed)
Pt extubated to 2 liters [place] approx 1510, no s/sx distress.  Pt extremely agitated while sedation was wearing off and he was awaiting extubation.  Per Dr Lyndel Safe continue low dose precedex to keep him calm and obtain EKG

## 2017-07-01 NOTE — Progress Notes (Signed)
Per Dr Lyndel Safe, DC K replacement per tube and order 20 MeQs IV

## 2017-07-01 NOTE — Progress Notes (Signed)
Patient is extubated due  to extreme  agitation

## 2017-07-01 NOTE — Progress Notes (Signed)
[facility] Progess Note     ASSESSMENT/PLAN  Acute hypoxic respiratory failure Atrial fibrillation Cardiomyopathy Elevated liver enzymes Diabetes Hypertension  PLAN Patient extubated nasal cannula. Replace electrolytes. Will monitor mental status and liver enzymes closely. Check ammonia level in a.m. Management of atypical ablation as per cardiology. Glycemic control. Full code. Family updated at bedside.  CC: [place] Artez Regis MD PCCM    SUBJECTIVE:  Patient seen and examined at bedside. No inspiratory lymphs bleeding trial and extubated to nasal cannula. Elevated LFTs noted. On low dose Precedex for agitation.   VITAL SIGNS: Temp:  [97.8 F (36.6 C)-98.6 F (37 C)] 98.6 F (37 C) (10/12 1600) Pulse Rate:  [77-117] 101 (10/12 1700) Resp:  [12-22] 12 (10/12 1700) BP: (81-119)/(60-88) 105/68 (10/12 1600) SpO2:  [96 %-99 %] 96 % (10/12 1600) FiO2 (%):  [35 %] 35 % (10/12 1200) Weight:  [126 lb 5.2 oz (57.3 kg)] 126 lb 5.2 oz (57.3 kg) (10/12 0500)   PHYSICAL EXAMINATION: Physical Examination:   VS: BP 105/68 (BP Location: Right Arm)   Pulse (!) 101   Temp 98.6 F (37 C) (Oral)   Resp 12   Ht 5\' 5"  (1.651 m)   Wt 126 lb 5.2 oz (57.3 kg)   SpO2 96%   BMI 21.02 kg/m   General Appearance: No distress  Neuro: without focal findings, mental status normal. HEENT: PERRLA, EOM intact. Pulmonary: Decreased air entry noted bilaterally Cardiovascular: Normal S1,S2.  No m/r/g.   Abdomen: Benign, Soft, non-tender. Extremities: normal, no cyanosis, clubbing.    LABORATORY PANEL:   CBC  Recent Labs Lab 07/01/17 0615  WBC 8.2  HGB 12.2*  HCT 36.3*  PLT 117*    Chemistries   Recent Labs Lab 06/30/17 1443 07/01/17 1000  NA  --  139  K  --  3.3*  CL  --  105  CO2  --  27  GLUCOSE  --  128*  BUN  --  26*  CREATININE  --  1.02  CALCIUM  --  8.0*  MG 1.9  --   PHOS 3.6  --   AST  --  1,120*  ALT  --  1,119*    ALKPHOS  --  72  BILITOT  --  2.6*     Recent Labs Lab 06/30/17 1942 06/30/17 2352 07/01/17 0427 07/01/17 0736 07/01/17 1210 07/01/17 1633  GLUCAP 125* 114* 120* 150* 115* 166*    Recent Labs Lab 06/30/17 0801 06/30/17 1130  PHART 7.48* 7.42  PCO2ART 30* 30*  PO2ART 62* 94    Recent Labs Lab 06/30/17 1240 07/01/17 1000  AST 1,335* 1,120*  ALT 1,018* 1,119*  ALKPHOS 85 72  BILITOT 2.8* 2.6*  ALBUMIN 3.9 3.3*    Cardiac Enzymes  Recent Labs Lab 06/27/17 2152  TROPONINI <0.03    RADIOLOGY:  Dg Chest Port 1 View  Result Date: 07/01/2017 CLINICAL DATA:  Acute respiratory failure. EXAM: PORTABLE CHEST 1 VIEW COMPARISON:  06/30/2017. FINDINGS: Endotracheal tube in satisfactory position. Stable left subclavian AICD lead. Right jugular catheter tip in the mid right atrium. Nasogastric tube extending into the distal stomach. Stable post CABG changes. Stable enlarged cardiac silhouette. Increased bilateral pleural fluid and ill-defined density at the right lung base and more confluent density at the left lung base. The interstitial markings are mildly more prominent. Mild right shoulder degenerative changes. IMPRESSION: 1. Interstitial pulmonary edema with increased bilateral pleural fluid. 2. Increased bibasilar atelectasis with interval dense  left lower lobe atelectasis or pneumonia. 3. Stable cardiomegaly. 4. Right jugular catheter tip in the mid right atrium. This could be retracted 4 cm to place it in the inferior aspect of the superior vena cava. Electronically Signed   By: Claudie Revering M.D.   On: 07/01/2017 13:21   Dg Chest Port 1 View  Result Date: 06/30/2017 CLINICAL DATA:  Status post central line placement. EXAM: PORTABLE CHEST 1 VIEW COMPARISON:  Chest x-ray from earlier same day. FINDINGS: New right IJ central line in place with tip positioned at the level of the right atrium. No pneumothorax seen. Trace pleural effusions bilaterally, stable. No new lung  findings. Cardiomegaly is stable. Endotracheal tube is well positioned with tip just above the level of the carina. Enteric tube passes below the diaphragm. IMPRESSION: 1. Right IJ central line in place with tip likely in the right atrium or possibly at the cavoatrial junction. 2. Endotracheal tube well positioned with tip just above the level of the carina. 3. Enteric tube passes below the diaphragm. 4. No pneumothorax. 5. Stable cardiomegaly. 6. Probable trace bilateral pleural effusions. Electronically Signed   By: Franki Cabot M.D.   On: 06/30/2017 12:11   Dg Chest Port 1 View  Result Date: 06/30/2017 CLINICAL DATA:  Shortness of breath, coronary disease, diabetes, hypertension EXAM: PORTABLE CHEST 1 VIEW COMPARISON:  06/27/2017 FINDINGS: Mild cardiomegaly with increased vascular and interstitial prominence and small effusions compatible with early edema. Minor basilar atelectasis. No definite focal pneumonia, collapse or consolidation. No pneumothorax. Trachea is midline. Remote coronary bypass changes noted and left subclavian defibrillator. Degenerative changes of the spine and right shoulder. Atherosclerosis of the aorta. IMPRESSION: Mild early CHF pattern Electronically Signed   By: Jerilynn Mages.  Shick M.D.   On: 06/30/2017 08:23   Dg Abd Portable 1v  Result Date: 06/30/2017 CLINICAL DATA:  Status post OG tube placement. EXAM: PORTABLE ABDOMEN - 1 VIEW COMPARISON:  None. FINDINGS: OG tube appears appropriately positioned with tip in the lower stomach or proximal duodenum. Visualized bowel gas pattern is nonobstructive. IMPRESSION: OG tube appears appropriately positioned with tip in the lower stomach or proximal duodenum. Electronically Signed   By: Franki Cabot M.D.   On: 06/30/2017 12:12        Dimas Chyle MD   07/01/2017

## 2017-07-01 NOTE — Progress Notes (Signed)
VSS on 2 liters, pt alert, somewhat confused, talks mostly with his family, making ordinary requests for cooler room temperature, ice ships, water, etc. Would like mitts off, writer discouraging this for first few hours off vent d/t prior agitation

## 2017-07-01 NOTE — Progress Notes (Signed)
MEDICATION RELATED CONSULT NOTE - INITIAL   Pharmacy Consult for electrolyte, constipation, and glucose management for 71 yo male ICU patient. Patient is currently ordered furosemide 40 mg IV BID.  Plan: Replace potassium with potassium chloride 40 mEq per tube Q 4 hours x 2 doses. Will recheck electrolytes with AM labs.  Last bowel movement on 10/10. Initiate senna-docusate 1 tab per tube BID. Will continue to monitor.  Patient is on hyperglycemia protocol. Will continue to follow.   No Known Allergies  Patient Measurements: Height: 5\' 5"  (165.1 cm) Weight: 126 lb 5.2 oz (57.3 kg) IBW/kg (Calculated) : 61.5  Vital Signs: Temp: 97.8 F (36.6 C) (10/12 1200) Temp Source: Oral (10/12 1200) BP: 108/77 (10/12 1200) Pulse Rate: 101 (10/12 1200) Intake/Output from previous day: 10/11 0701 - 10/12 0700 In: 831.7 [I.V.:580.1; NG/GT:251.7] Out: 3195 [Urine:3195] Intake/Output from this shift: Total I/O In: 531.9 [I.V.:236.9; NG/GT:295] Out: 820 [Urine:820]  Labs:  Recent Labs  06/30/17 0157 06/30/17 1240 06/30/17 1443 06/30/17 2203 07/01/17 0615 07/01/17 1000  WBC 9.1 10.9*  --   --  8.2  --   HGB 12.6* 13.0  --   --  12.2*  --   HCT 36.6* 39.1*  --   --  36.3*  --   PLT 158 140*  --   --  117*  --   APTT  --   --   --  >160*  --  107*  CREATININE 1.19 1.29*  --   --   --  1.02  MG 2.1  --  1.9  --   --   --   PHOS  --   --  3.6  --   --   --   ALBUMIN  --  3.9  --   --   --  3.3*  PROT  --  6.9  --   --   --  6.2*  AST  --  1,335*  --   --   --  1,120*  ALT  --  1,018*  --   --   --  1,119*  ALKPHOS  --  85  --   --   --  72  BILITOT  --  2.8*  --   --   --  2.6*   Estimated Creatinine Clearance: 53.1 mL/min (by C-G formula based on SCr of 1.02 mg/dL).   Medical History: Past Medical History:  Diagnosis Date  . CAD (coronary artery disease)   . Diabetes ([place])   . H/O: stroke   . HTN (hypertension)      Pharmacy will continue to monitor and adjust per  consult.  Oralia Manis 07/01/2017,2:03 PM

## 2017-07-01 NOTE — Consult Note (Signed)
[place] for heparin Indication: atrial fibrillation  No Known Allergies  Patient Measurements: Height: 5\' 5"  (165.1 cm) Weight: 126 lb 5.2 oz (57.3 kg) IBW/kg (Calculated) : 61.5 Heparin Dosing Weight: 59.4 kg  Vital Signs: Temp: 98.6 F (37 C) (10/12 1600) Temp Source: Oral (10/12 1600) BP: 105/68 (10/12 1600) Pulse Rate: 101 (10/12 1700)  Labs:  Recent Labs  06/30/17 0157 06/30/17 1240 06/30/17 2203 07/01/17 0615 07/01/17 1000  HGB 12.6* 13.0  --  12.2*  --   HCT 36.6* 39.1*  --  36.3*  --   PLT 158 140*  --  117*  --   APTT  --   --  >160*  --  107*  HEPARINUNFRC  --   --  2.00*  --  2.20*  CREATININE 1.19 1.29*  --   --  1.02    Estimated Creatinine Clearance: 53.1 mL/min (by C-G formula based on SCr of 1.02 mg/dL).   Medical History: Past Medical History:  Diagnosis Date  . CAD (coronary artery disease)   . Diabetes ([place])   . H/O: stroke   . HTN (hypertension)     Medications:  Scheduled:  . atorvastatin  80 mg Oral Daily  . [START ON 16/06/9603] folic acid  1 mg Per Tube Daily  . furosemide  40 mg Intravenous BID  . insulin aspart  2-6 Units Subcutaneous Q4H  . mouth rinse  15 mL Mouth Rinse BID  . [START ON 07/02/2017] pantoprazole sodium  40 mg Per Tube Daily  . potassium chloride  40 mEq Oral Q4H  . sodium chloride flush  10-40 mL Intracatheter Q12H    Assessment: Patient is a 71 year old male found to be in afib w/ RVR. Pharmacy consulted to start anticoagulation w/ apixaban. Pt has a CHA2DS2-VASc score of 5. Patient requiring intubation 10/11 to transition to heparin drip. Patient currently receiving heparin infusion at 700 units/hr.   Goal of Therapy:  APTT 68-109 HL 0.3-0.7 Monitor platelets by anticoagulation protocol: Yes   Plan:  Heparin level is at upper end of range. Will recheck APTT at 1730 and anti-Xa with am labs.   Pharmacy will continue to monitor and adjust per consult.   MLS 07/01/2017

## 2017-07-02 ENCOUNTER — Inpatient Hospital Stay: Payer: Medicare Other

## 2017-07-02 DIAGNOSIS — I4891 Unspecified atrial fibrillation: Secondary | ICD-10-CM

## 2017-07-02 LAB — AMMONIA: AMMONIA: 11 umol/L (ref 9–35)

## 2017-07-02 LAB — URINALYSIS, ROUTINE W REFLEX MICROSCOPIC
Bilirubin Urine: NEGATIVE
GLUCOSE, UA: NEGATIVE mg/dL
KETONES UR: 20 mg/dL — AB
NITRITE: NEGATIVE
PROTEIN: 100 mg/dL — AB
Specific Gravity, Urine: 1.02 (ref 1.005–1.030)
pH: 6 (ref 5.0–8.0)

## 2017-07-02 LAB — HEPARIN LEVEL (UNFRACTIONATED)
HEPARIN UNFRACTIONATED: 1.05 [IU]/mL — AB (ref 0.30–0.70)
Heparin Unfractionated: 1.65 IU/mL — ABNORMAL HIGH (ref 0.30–0.70)

## 2017-07-02 LAB — BLOOD GAS, ARTERIAL
ACID-BASE EXCESS: 1.3 mmol/L (ref 0.0–2.0)
BICARBONATE: 24.9 mmol/L (ref 20.0–28.0)
FIO2: 0.36
O2 SAT: 95.5 %
PCO2 ART: 35 mmHg (ref 32.0–48.0)
PO2 ART: 74 mmHg — AB (ref 83.0–108.0)
Patient temperature: 37
pH, Arterial: 7.46 — ABNORMAL HIGH (ref 7.350–7.450)

## 2017-07-02 LAB — COMPREHENSIVE METABOLIC PANEL
ALK PHOS: 83 U/L (ref 38–126)
ALT: 1117 U/L — ABNORMAL HIGH (ref 17–63)
ANION GAP: 11 (ref 5–15)
AST: 835 U/L — ABNORMAL HIGH (ref 15–41)
Albumin: 3.8 g/dL (ref 3.5–5.0)
BILIRUBIN TOTAL: 2.8 mg/dL — AB (ref 0.3–1.2)
BUN: 28 mg/dL — AB (ref 6–20)
CALCIUM: 8.2 mg/dL — AB (ref 8.9–10.3)
CO2: 27 mmol/L (ref 22–32)
Chloride: 101 mmol/L (ref 101–111)
Creatinine, Ser: 1.19 mg/dL (ref 0.61–1.24)
GFR calc Af Amer: >60 mL/min (ref >60)
GFR, EST NON AFRICAN AMERICAN: 59 mL/min — AB (ref >60)
GLUCOSE: 158 mg/dL — AB (ref 65–99)
POTASSIUM: 4 mmol/L (ref 3.5–5.1)
Sodium: 139 mmol/L (ref 135–145)
TOTAL PROTEIN: 6.8 g/dL (ref 6.5–8.1)

## 2017-07-02 LAB — GLUCOSE, CAPILLARY
GLUCOSE-CAPILLARY: 141 mg/dL — AB (ref 65–99)
GLUCOSE-CAPILLARY: 101 mg/dL — AB (ref 65–99)
GLUCOSE-CAPILLARY: 117 mg/dL — AB (ref 65–99)
Glucose-Capillary: 119 mg/dL — ABNORMAL HIGH (ref 65–99)
Glucose-Capillary: 93 mg/dL (ref 65–99)
Glucose-Capillary: 97 mg/dL (ref 65–99)

## 2017-07-02 LAB — APTT
aPTT: >160 seconds (ref 24–36)
aPTT: 125 seconds — ABNORMAL HIGH (ref 24–36)
aPTT: 66 seconds — ABNORMAL HIGH (ref 24–36)

## 2017-07-02 LAB — CBC
HCT: 39.1 % — ABNORMAL LOW (ref 40.0–52.0)
Hemoglobin: 13.1 g/dL (ref 13.0–18.0)
MCH: 29 pg (ref 26.0–34.0)
MCHC: 33.7 g/dL (ref 32.0–36.0)
MCV: 86 fL (ref 80.0–100.0)
Platelets: 130 10*3/uL — ABNORMAL LOW (ref 150–440)
RBC: 4.54 MIL/uL (ref 4.40–5.90)
RDW: 15.7 % — AB (ref 11.5–14.5)
WBC: 6.9 10*3/uL (ref 3.8–10.6)

## 2017-07-02 LAB — PHOSPHORUS: Phosphorus: 3.3 mg/dL (ref 2.5–4.6)

## 2017-07-02 LAB — MAGNESIUM: MAGNESIUM: 2.1 mg/dL (ref 1.7–2.4)

## 2017-07-02 MED ORDER — HEPARIN (PORCINE) IN NACL 100-0.45 UNIT/ML-% IJ SOLN
500.0000 [IU]/h | INTRAMUSCULAR | Status: AC
  Administered 2017-07-02: 600 [IU]/h via INTRAVENOUS

## 2017-07-02 MED ORDER — AMIODARONE HCL IN DEXTROSE 360-4.14 MG/200ML-% IV SOLN
30.0000 mg/h | INTRAVENOUS | Status: DC
  Administered 2017-07-03 – 2017-07-05 (×5): 30 mg/h via INTRAVENOUS
  Filled 2017-07-02 (×5): qty 200

## 2017-07-02 MED ORDER — MORPHINE SULFATE (PF) 2 MG/ML IV SOLN
2.0000 mg | INTRAVENOUS | Status: DC | PRN
  Administered 2017-07-02 – 2017-07-03 (×3): 2 mg via INTRAVENOUS
  Filled 2017-07-02 (×3): qty 1

## 2017-07-02 MED ORDER — DIPHENHYDRAMINE HCL 50 MG/ML IJ SOLN
50.0000 mg | Freq: Once | INTRAMUSCULAR | Status: AC
  Administered 2017-07-02: 50 mg via INTRAVENOUS
  Filled 2017-07-02: qty 1

## 2017-07-02 MED ORDER — FUROSEMIDE 10 MG/ML IJ SOLN
20.0000 mg | Freq: Two times a day (BID) | INTRAMUSCULAR | Status: DC
  Administered 2017-07-02 – 2017-07-04 (×6): 20 mg via INTRAVENOUS
  Filled 2017-07-02 (×6): qty 2

## 2017-07-02 MED ORDER — AMIODARONE HCL IN DEXTROSE 360-4.14 MG/200ML-% IV SOLN
60.0000 mg/h | INTRAVENOUS | Status: AC
  Administered 2017-07-03: 60 mg/h via INTRAVENOUS
  Filled 2017-07-02: qty 200

## 2017-07-02 NOTE — Progress Notes (Signed)
Spoke with M. Patria Mane, NP about patient continued to be very restless and agitated.  To give Morphine and Benadryl ordered.

## 2017-07-02 NOTE — Progress Notes (Signed)
Extremely agitated.  Pulled gown, all medical equipment off. On knees in bed, swinging cables and hitting bed with cables.  Screaming get away, get away.  Code 300 called.  Able to get patient to lie down in bed.  Nephews called to come in to be with patient.

## 2017-07-02 NOTE — Progress Notes (Signed)
PT Cancellation Note  Patient Details Name: Walter Blair MRN: [id number] DOB: 12/03/1945   Cancelled Treatment:    Reason Eval/Treat Not Completed: Patient not medically ready Spoke with nursing who reports that pt is finally sleeping and she states that he is likely more appropriate to start tomorrow.  Will hold PT today and readdress tomorrow as appropriate.  Kreg Shropshire, DPT 07/02/2017, 1:25 PM

## 2017-07-02 NOTE — Progress Notes (Signed)
Nephews remain at bedside.  Continues to pull at medical equipment.

## 2017-07-02 NOTE — Progress Notes (Signed)
Patient constantly pulling off gown, electrodes and oxygen.  Encouraged patient to leave on.

## 2017-07-02 NOTE — Evaluation (Signed)
Physical Therapy Evaluation Patient Details Name: Walter Blair MRN: [id number] DOB: November 14, 1945 Today's Date: 07/02/2017   History of Present Illness  71 year old male with past medical history of coronary artery disease status post bypass, diabetes, hypertension, history of previous CVA who presented to the hospital due to dizziness and noted to be in acute atrial fibrillation with rapid ventricular response.  He needed to be transfered to the CCU and intubated, extubated 10/12.  Clinical Impression  Pt eager to get up and try to walk with PT.  He did well on room air the entire time with O2 sats staying in the high 90s in bed and staying >93% during ambulation, his HR however did rise to 140s with ambulation, though came back to the 110s quickly with rest.  He did well with bed mobility and getting to standing, not needing any assist.  Pt typically lives alone, but will be able to stay with family for a while after discharge.  Once he is medically stable this will be a safe option at discharge, PT recommending HHPT to continue working back to his baseline of living independently.     Follow Up Recommendations Home health PT    Equipment Recommendations  None recommended by PT    Recommendations for Other Services       Precautions / Restrictions Precautions Precautions: Fall Restrictions Weight Bearing Restrictions: No      Mobility  Bed Mobility Overal bed mobility: Modified Independent             General bed mobility comments: Pt able to get himself to sitting EOB w/o direct physical assist, minimal need of hand rails  Transfers Overall transfer level: Modified independent Equipment used: Rolling walker (2 wheeled)             General transfer comment: Pt needed minimal cuing for foot placement and sequencing, able to rise w/o direct assit  Ambulation/Gait Ambulation/Gait assistance: Min guard Ambulation Distance (Feet): 50 Feet Assistive device: Rolling  walker (2 wheeled)       General Gait Details: Pt with slow, guarded ambualtion but did not have any LOBs or signficant stagger steps.  He did have some fatigue and needed a standing rest break.  (HR did increase to 140s, quickly came back down to 100s with break, on room air O2 stayed >93%)  Stairs            Wheelchair Mobility    Modified Rankin (Stroke Patients Only)       Balance Overall balance assessment: Modified Independent                                           Pertinent Vitals/Pain Pain Assessment: No/denies pain    Home Living Family/patient expects to be discharged to:: Private residence Living Arrangements: Alone Available Help at Discharge: Family (will stay with other family initially)   Home Access:  (4 steps at his home, ramped entrance where he will be going)       Home Equipment: Walker - 2 wheels;Cane - single point      Prior Function Level of Independence: Independent         Comments: Pt able to drive, run his errands, etc w/o issue     Hand Dominance        Extremity/Trunk Assessment   Upper Extremity Assessment Upper Extremity Assessment: Generalized weakness;Overall [facility] for tasks  assessed (chronic L UE weakness/ROM issues from fx/CVA)    Lower Extremity Assessment Lower Extremity Assessment: Overall WFL for tasks assessed       Communication   Communication: No difficulties  Cognition Arousal/Alertness: Awake/alert Behavior During Therapy: WFL for tasks assessed/performed Overall Cognitive Status: Within Functional Limits for tasks assessed                                        General Comments      Exercises     Assessment/Plan    PT Assessment Patient needs continued PT services  PT Problem List Decreased strength;Decreased range of motion;Decreased activity tolerance;Decreased balance;Decreased mobility;Decreased cognition;Decreased knowledge of use of DME;Decreased  safety awareness;Cardiopulmonary status limiting activity       PT Treatment Interventions DME instruction;Gait training;Stair training;Functional mobility training;Therapeutic activities;Therapeutic exercise;Balance training;Neuromuscular re-education;Patient/family education    PT Goals (Current goals can be found in the Care Plan section)  Acute Rehab PT Goals Patient Stated Goal: go home PT Goal Formulation: With patient Time For Goal Achievement: 07/16/17 Potential to Achieve Goals: Good    Frequency Min 2X/week   Barriers to discharge        Co-evaluation               AM-PAC PT "6 Clicks" Daily Activity  Outcome Measure Difficulty turning over in bed (including adjusting bedclothes, sheets and blankets)?: A Little Difficulty moving from lying on back to sitting on the side of the bed? : A Little Difficulty sitting down on and standing up from a chair with arms (e.g., wheelchair, bedside commode, etc,.)?: A Little Help needed moving to and from a bed to chair (including a wheelchair)?: A Little Help needed walking in hospital room?: A Little Help needed climbing 3-5 steps with a railing? : A Lot 6 Click Score: 17    End of Session Equipment Utilized During Treatment: Gait belt Activity Tolerance: Patient limited by fatigue Patient left: in chair;with call bell/phone within reach;with nursing/sitter in room;with family/visitor present Nurse Communication: Mobility status PT Visit Diagnosis: Muscle weakness (generalized) (M62.81);Difficulty in walking, not elsewhere classified (R26.2)    Time: 6606-0045 PT Time Calculation (min) (ACUTE ONLY): 28 min   Charges:   PT Evaluation $PT Eval Low Complexity: 1 Low PT Treatments $Gait Training: 8-22 mins   PT G Codes:        Kreg Shropshire, DPT 07/02/2017, 5:47 PM

## 2017-07-02 NOTE — Progress Notes (Signed)
Patient continuing to pull off gown, electrodes and oxygen.  Attempting to get OOB.  Precedex restarted.

## 2017-07-02 NOTE — Consult Note (Signed)
ANTICOAGULATION CONSULT NOTE  Pharmacy Consult for heparin Indication: atrial fibrillation  No Known Allergies  Patient Measurements: Height: 5\' 5"  (165.1 cm) Weight: 121 lb 7.6 oz (55.1 kg) IBW/kg (Calculated) : 61.5 Heparin Dosing Weight: 59.4 kg  Vital Signs: Temp: 97.9 F (36.6 C) (10/12 1956) Temp Source: Oral (10/12 1956) BP: 104/78 (10/13 0600) Pulse Rate: 99 (10/13 0600)  Labs:  Recent Labs  06/30/17 1240  06/30/17 2203 07/01/17 0615 07/01/17 1000 07/01/17 1808 07/02/17 0305 07/02/17 0310  HGB 13.0  --   --  12.2*  --   --  13.1  --   HCT 39.1*  --   --  36.3*  --   --  39.1*  --   PLT 140*  --   --  117*  --   --  130*  --   APTT  --   < > >160*  --  107* 93* >160*  --   HEPARINUNFRC  --   --  2.00*  --  2.20*  --  1.65*  --   CREATININE 1.29*  --   --   --  1.02  --   --  1.19  < > = values in this interval not displayed.  Estimated Creatinine Clearance: 43.7 mL/min (by C-G formula based on SCr of 1.19 mg/dL).   Medical History: Past Medical History:  Diagnosis Date  . CAD (coronary artery disease)   . Diabetes ([place])   . H/O: stroke   . HTN (hypertension)     Medications:  Scheduled:  . atorvastatin  80 mg Oral Daily  . folic acid  1 mg Per Tube Daily  . furosemide  40 mg Intravenous BID  . insulin aspart  2-6 Units Subcutaneous Q4H  . mouth rinse  15 mL Mouth Rinse BID  . pantoprazole sodium  40 mg Per Tube Daily  . sodium chloride flush  10-40 mL Intracatheter Q12H    Assessment: Patient is a 71 year old male found to be in afib w/ RVR. Pharmacy consulted to start anticoagulation w/ apixaban. Pt has a CHA2DS2-VASc score of 5. Patient requiring intubation 10/11 to transition to heparin drip.   Goal of Therapy:  APTT 68-109 HL 0.3-0.7 Monitor platelets by anticoagulation protocol: Yes   Plan:  Continue heparin infusion at current rate and f/u AM labs.   10/13 @ 0300 aPTT >160, HL 1.65, both supratherapeutic. HL is coming down as  expected from apixaban administration. Per RN says phlebs drew aPTT/HL out of other arm so aPTT may be real. Will stop drip for 1 hour and will resume at 600 units/hr and will recheck HL/aPTT @ 1200. Will check CBC w/ am labs.  Tobie Lords, PharmD, BCPS Clinical Pharmacist 07/02/2017

## 2017-07-02 NOTE — Progress Notes (Signed)
Patient ID: Walter Blair, male   DOB: June 12, 1946, 71 y.o.   MRN: [id number]  [employer] PROGRESS NOTE  Walter Blair WUJ:[id number] DOB: Jun 23, 1946 DOA: 06/27/2017 PCP: Jodi Marble, MD  HPI/Subjective: Patient feels okay. Off Precedex drip. Extubated last night. Still with some confusion.  Family states he's doing better than he has been.  Objective: Vitals:   07/02/17 1300 07/02/17 1400  BP: 93/74 (!) 113/97  Pulse: 90 81  Resp: 17 (!) 26  Temp:    SpO2: 97% 99%    Filed Weights   06/28/17 0128 07/01/17 0500 07/02/17 0500  Weight: 59.4 kg (131 lb) 57.3 kg (126 lb 5.2 oz) 55.1 kg (121 lb 7.6 oz)    ROS: Review of Systems  Constitutional: Negative for chills and fever.  Eyes: Negative for blurred vision.  Respiratory: Positive for shortness of breath. Negative for cough.   Cardiovascular: Negative for chest pain.  Gastrointestinal: Negative for abdominal pain, constipation, diarrhea, nausea and vomiting.  Genitourinary: Negative for dysuria.  Musculoskeletal: Negative for joint pain.  Neurological: Negative for dizziness and headaches.   Exam: Physical Exam  HENT:  Nose: No mucosal edema.  Mouth/Throat: No oropharyngeal exudate or posterior oropharyngeal edema.  Eyes: Pupils are equal, round, and reactive to light. Conjunctivae, EOM and lids are normal.  Neck: No JVD present. Carotid bruit is not present. No edema present. No thyroid mass and no thyromegaly present.  Cardiovascular: S1 normal and S2 normal.  An irregularly irregular rhythm present. Exam reveals no gallop.   Murmur heard.  Systolic murmur is present with a grade of 2/6  Pulses:      Dorsalis pedis pulses are 2+ on the right side, and 2+ on the left side.  Respiratory: No respiratory distress. He has no wheezes. He has no rhonchi. He has rales in the right lower field and the left lower field.  GI: Soft. Bowel sounds are normal. There is no tenderness.  Musculoskeletal:       Right ankle: He  exhibits no swelling.       Left ankle: He exhibits no swelling.  Lymphadenopathy:    He has no cervical adenopathy.  Neurological: He is alert. No cranial nerve deficit.  Skin: Skin is warm. No rash noted. Nails show no clubbing.  Psychiatric: He is slowed.      Data Reviewed: Basic Metabolic Panel:  Recent Labs Lab 06/28/17 0435 06/30/17 0157 06/30/17 1240 06/30/17 1443 07/01/17 1000 07/02/17 0305 07/02/17 0310  NA 141 138 138  --  139  --  139  K 3.5 3.9 4.1  --  3.3*  --  4.0  CL 108 102 103  --  105  --  101  CO2 28 26 19*  --  27  --  27  GLUCOSE 119* 148* 105*  --  128*  --  158*  BUN 17 22* 25*  --  26*  --  28*  CREATININE 1.06 1.19 1.29*  --  1.02  --  1.19  CALCIUM 8.5* 8.5* 8.6*  --  8.0*  --  8.2*  MG  --  2.1  --  1.9  --  2.1  --   PHOS  --   --   --  3.6  --  3.3  --    Liver Function Tests:  Recent Labs Lab 06/30/17 1240 07/01/17 1000 07/02/17 0310  AST 1,335* 1,120* 835*  ALT 1,018* 1,119* 1,117*  ALKPHOS 85 72 83  BILITOT 2.8* 2.6* 2.8*  PROT 6.9 6.2* 6.8  ALBUMIN 3.9 3.3* 3.8    Recent Labs Lab 07/02/17 0305  AMMONIA 11   CBC:  Recent Labs Lab 06/28/17 0435 06/30/17 0157 06/30/17 1240 07/01/17 0615 07/02/17 0305  WBC 4.8 9.1 10.9* 8.2 6.9  HGB 11.0* 12.6* 13.0 12.2* 13.1  HCT 32.4* 36.6* 39.1* 36.3* 39.1*  MCV 85.4 84.9 86.2 86.0 86.0  PLT 130* 158 140* 117* 130*   Cardiac Enzymes:  Recent Labs Lab 06/27/17 2152  TROPONINI <0.03   BNP (last 3 results)  Recent Labs  07/01/17 0615  BNP 344.0*     CBG:  Recent Labs Lab 07/01/17 1633 07/01/17 1935 07/02/17 0013 07/02/17 0403 07/02/17 0802  GLUCAP 166* 95 93 141* 97    Recent Results (from the past 240 hour(s))  MRSA PCR Screening     Status: None   Collection Time: 06/30/17 11:03 AM  Result Value Ref Range Status   MRSA by PCR NEGATIVE NEGATIVE Final    Comment:        The GeneXpert MRSA Assay (FDA approved for NASAL specimens only), is one  component of a comprehensive MRSA colonization surveillance program. It is not intended to diagnose MRSA infection nor to guide or monitor treatment for MRSA infections.      Studies: Dg Chest Port 1 View  Result Date: 07/02/2017 CLINICAL DATA:  Acute respiratory failure. History of atrial fibrillation, CAD, diabetes, hypertension and stroke. EXAM: PORTABLE CHEST 1 VIEW COMPARISON:  07/01/2017; 06/30/2017; 06/27/2017 FINDINGS: Grossly unchanged cardiac silhouette and mediastinal contours post median sternotomy and CABG. Interval extubation and removal of enteric tube. Interval removal of right jugular approach intravenous catheter. Otherwise, stable position of remaining support apparatus. The pulmonary vasculature remains indistinct with cephalization of flow. Unchanged small layering effusions and associated bibasilar opacities, left greater than right. No pneumothorax. No acute osseus abnormalities. Mild-to-moderate degenerative change of the right glenohumeral joint, incompletely evaluated. IMPRESSION: 1. Interval extubation and removal of enteric tube and right jugular approach central venous catheter. No pneumothorax. 2. Similar findings of pulmonary edema, small effusions and associated bibasilar opacities, left greater than right, atelectasis versus infiltrate. Electronically Signed   By: Sandi Mariscal M.D.   On: 07/02/2017 09:27   Dg Chest Port 1 View  Result Date: 07/01/2017 CLINICAL DATA:  Acute respiratory failure. EXAM: PORTABLE CHEST 1 VIEW COMPARISON:  06/30/2017. FINDINGS: Endotracheal tube in satisfactory position. Stable left subclavian AICD lead. Right jugular catheter tip in the mid right atrium. Nasogastric tube extending into the distal stomach. Stable post CABG changes. Stable enlarged cardiac silhouette. Increased bilateral pleural fluid and ill-defined density at the right lung base and more confluent density at the left lung base. The interstitial markings are mildly more  prominent. Mild right shoulder degenerative changes. IMPRESSION: 1. Interstitial pulmonary edema with increased bilateral pleural fluid. 2. Increased bibasilar atelectasis with interval dense left lower lobe atelectasis or pneumonia. 3. Stable cardiomegaly. 4. Right jugular catheter tip in the mid right atrium. This could be retracted 4 cm to place it in the inferior aspect of the superior vena cava. Electronically Signed   By: Claudie Revering M.D.   On: 07/01/2017 13:21    Scheduled Meds: . atorvastatin  80 mg Oral Daily  . folic acid  1 mg Per Tube Daily  . furosemide  20 mg Intravenous BID  . insulin aspart  2-6 Units Subcutaneous Q4H  . mouth rinse  15 mL Mouth Rinse BID  . pantoprazole sodium  40 mg Per Tube Daily  Continuous Infusions: . dexmedetomidine (PRECEDEX) IV infusion Stopped (07/02/17 0915)  . heparin 600 Units/hr (07/02/17 1400)    Assessment/Plan:  1. Acute delirium.  Patient off Precedex drip since this morning. Critical care specialist would like to watch one more night in the ICU. 2. Atrial fibrillation with rapid ventricular response. Patient currently not on any rate controlling medications. Patient on heparin drip at this point for anticoagulation 3. Very elevated liver function tests. Suggest getting rid of atorvastatin. This was discussed with critical care specialist. Also consider sending off hepatitis profiles and possible liver ultrasound. This could also be passive congestion from CHF. 4. 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 Continue diuresis with Lasix. Blood pressure too low for medications at this point. 5. Type 2 diabetes mellitus on sliding scale insulin 6. History of stroke on heparin drip for now  Code Status:     Code Status Orders        Start     Ordered   06/28/17 0115  Full code  Continuous     06/28/17 0114    Code Status History    Date Active Date Inactive Code Status Order ID Comments User Context    This patient has a current code status but no historical code status.    Advance Directive Documentation     Most Recent Value  Type of Advance Directive  Healthcare Power of Attorney, Living will  Pre-existing out of facility DNR order (yellow form or pink MOST form)  -  "MOST" Form in Place?  -     Family Communication: family at the bedside Disposition Plan: to be determined  Consultants:  Critical care specialist  Cardiology  Time spent: 28 minutes  [place], [place]

## 2017-07-02 NOTE — Progress Notes (Signed)
Patient resting quietly with eyes closed.  No agitation at this time.  Precedex decreased

## 2017-07-02 NOTE — Progress Notes (Signed)
[facility] Progess Note     ASSESSMENT/PLAN  Acute hypoxic respiratory failure Atrial fibrillation Cardiomyopathy Elevated liver enzymes Diabetes Hypertension Severe delirium with agitation  PLAN Patient extubated nasal cannula.  Monitor and correct electrolytes Sitter at bedside for safety Haldol and morphine prn for agitation and dyspnea Will monitor mental status and liver enzymes closely. Ammonia level normal; repeat prn Management of atypical ablation as per cardiology. Glycemic control. Anticoagulation per cardiology IV diuresis Full code. Family updated at bedside.     SUBJECTIVE: Called to the bedside for severe agitation. Patient pulled his central line at the beginning of the shift. Family notified and nephews came to spend time with patient. Precedex was turned off and patient was resting comfortably. After family left, patient became very agitated; requesting to leave the hospital and pushing staff away. Precedex was started and titrated to maximum dose without effect. STAT labs unremarkable. Patient's family notified and both nephews came to be with the patient yet he still won't settle down. Haldol given with minimal effect. Morphine and benadryl given. Patient fially fell asleep at about 6 am.   VITAL SIGNS: Temp:  [97.3 F (36.3 C)-98.6 F (37 C)] 97.3 F (36.3 C) (10/13 0800) Pulse Rate:  [77-117] 92 (10/13 0900) Resp:  [12-31] 16 (10/13 0900) BP: (83-150)/(64-105) 119/71 (10/13 0900) SpO2:  [86 %-100 %] 96 % (10/13 0900) FiO2 (%):  [35 %] 35 % (10/12 1200) Weight:  [121 lb 7.6 oz (55.1 kg)] 121 lb 7.6 oz (55.1 kg) (10/13 0500)   PHYSICAL EXAMINATION: Physical Examination:   VS: BP 119/71   Pulse 92   Temp (!) 97.3 F (36.3 C) (Axillary)   Resp 16   Ht 5\' 5"  (1.651 m)   Wt 121 lb 7.6 oz (55.1 kg)   SpO2 96%   BMI 20.21 kg/m   General Appearance: Restless and agitated  Neuro: Alert and oriented to person only,  awake, moves all extremities HEENT: PERRLA, EOM intact, orla mucosa dry Pulmonary: Decreased air flow bilaterally, no wheezes, coarse rhonchi in anterior lung fields Cardiovascular: Tachycardic, S1,S2.  No m/r/g, no edema Abdomen: Benign, Soft, non-tender. Extremities: normal, no cyanosis, clubbing.    LABORATORY PANEL:   CBC  Recent Labs Lab 07/02/17 0305  WBC 6.9  HGB 13.1  HCT 39.1*  PLT 130*    Chemistries   Recent Labs Lab 07/02/17 0305 07/02/17 0310  NA  --  139  K  --  4.0  CL  --  101  CO2  --  27  GLUCOSE  --  158*  BUN  --  28*  CREATININE  --  1.19  CALCIUM  --  8.2*  MG 2.1  --   PHOS 3.3  --   AST  --  835*  ALT  --  1,117*  ALKPHOS  --  83  BILITOT  --  2.8*     Recent Labs Lab 07/01/17 1210 07/01/17 1633 07/01/17 1935 07/02/17 0013 07/02/17 0403 07/02/17 0802  GLUCAP 115* 166* 95 93 141* 97    Recent Labs Lab 06/30/17 0801 06/30/17 1130 07/02/17 0318  PHART 7.48* 7.42 7.46*  PCO2ART 30* 30* 35  PO2ART 62* 94 74*    Recent Labs Lab 06/30/17 1240 07/01/17 1000 07/02/17 0310  AST 1,335* 1,120* 835*  ALT 1,018* 1,119* 1,117*  ALKPHOS 85 72 83  BILITOT 2.8* 2.6* 2.8*  ALBUMIN 3.9 3.3* 3.8    Cardiac Enzymes  Recent Labs Lab 06/27/17 2152  TROPONINI <  0.03    RADIOLOGY:  Dg Chest Port 1 View  Result Date: 07/02/2017 CLINICAL DATA:  Acute respiratory failure. History of atrial fibrillation, CAD, diabetes, hypertension and stroke. EXAM: PORTABLE CHEST 1 VIEW COMPARISON:  07/01/2017; 06/30/2017; 06/27/2017 FINDINGS: Grossly unchanged cardiac silhouette and mediastinal contours post median sternotomy and CABG. Interval extubation and removal of enteric tube. Interval removal of right jugular approach intravenous catheter. Otherwise, stable position of remaining support apparatus. The pulmonary vasculature remains indistinct with cephalization of flow. Unchanged small layering effusions and associated bibasilar opacities,  left greater than right. No pneumothorax. No acute osseus abnormalities. Mild-to-moderate degenerative change of the right glenohumeral joint, incompletely evaluated. IMPRESSION: 1. Interval extubation and removal of enteric tube and right jugular approach central venous catheter. No pneumothorax. 2. Similar findings of pulmonary edema, small effusions and associated bibasilar opacities, left greater than right, atelectasis versus infiltrate. Electronically Signed   By: Sandi Mariscal M.D.   On: 07/02/2017 09:27   Dg Chest Port 1 View  Result Date: 07/01/2017 CLINICAL DATA:  Acute respiratory failure. EXAM: PORTABLE CHEST 1 VIEW COMPARISON:  06/30/2017. FINDINGS: Endotracheal tube in satisfactory position. Stable left subclavian AICD lead. Right jugular catheter tip in the mid right atrium. Nasogastric tube extending into the distal stomach. Stable post CABG changes. Stable enlarged cardiac silhouette. Increased bilateral pleural fluid and ill-defined density at the right lung base and more confluent density at the left lung base. The interstitial markings are mildly more prominent. Mild right shoulder degenerative changes. IMPRESSION: 1. Interstitial pulmonary edema with increased bilateral pleural fluid. 2. Increased bibasilar atelectasis with interval dense left lower lobe atelectasis or pneumonia. 3. Stable cardiomegaly. 4. Right jugular catheter tip in the mid right atrium. This could be retracted 4 cm to place it in the inferior aspect of the superior vena cava. Electronically Signed   By: Claudie Revering M.D.   On: 07/01/2017 13:21   Dg Chest Port 1 View  Result Date: 06/30/2017 CLINICAL DATA:  Status post central line placement. EXAM: PORTABLE CHEST 1 VIEW COMPARISON:  Chest x-ray from earlier same day. FINDINGS: New right IJ central line in place with tip positioned at the level of the right atrium. No pneumothorax seen. Trace pleural effusions bilaterally, stable. No new lung findings. Cardiomegaly is  stable. Endotracheal tube is well positioned with tip just above the level of the carina. Enteric tube passes below the diaphragm. IMPRESSION: 1. Right IJ central line in place with tip likely in the right atrium or possibly at the cavoatrial junction. 2. Endotracheal tube well positioned with tip just above the level of the carina. 3. Enteric tube passes below the diaphragm. 4. No pneumothorax. 5. Stable cardiomegaly. 6. Probable trace bilateral pleural effusions. Electronically Signed   By: Franki Cabot M.D.   On: 06/30/2017 12:11   Dg Abd Portable 1v  Result Date: 06/30/2017 CLINICAL DATA:  Status post OG tube placement. EXAM: PORTABLE ABDOMEN - 1 VIEW COMPARISON:  None. FINDINGS: OG tube appears appropriately positioned with tip in the lower stomach or proximal duodenum. Visualized bowel gas pattern is nonobstructive. IMPRESSION: OG tube appears appropriately positioned with tip in the lower stomach or proximal duodenum. Electronically Signed   By: Franki Cabot M.D.   On: 06/30/2017 12:12    Mattheo Swindle S. [facility] ANP-BC Pulmonary and Critical Care Medicine [facility] Pager [phone] or [phone]  07/02/2017

## 2017-07-02 NOTE — Consult Note (Signed)
[place] for heparin Indication: atrial fibrillation  No Known Allergies  Patient Measurements: Height: 5\' 5"  (165.1 cm) Weight: 121 lb 7.6 oz (55.1 kg) IBW/kg (Calculated) : 61.5 Heparin Dosing Weight: 59.4 kg  Vital Signs: Temp: 97.8 F (36.6 C) (10/13 1200) Temp Source: Axillary (10/13 1200) BP: 113/97 (10/13 1400) Pulse Rate: 81 (10/13 1400)  Labs:  Recent Labs  06/30/17 1240  06/30/17 2203 07/01/17 0615 07/01/17 1000 07/01/17 1808 07/02/17 0305 07/02/17 0310 07/02/17 1225  HGB 13.0  --   --  12.2*  --   --  13.1  --   --   HCT 39.1*  --   --  36.3*  --   --  39.1*  --   --   PLT 140*  --   --  117*  --   --  130*  --   --   APTT  --   < > >160*  --  107* 93* >160*  --  125*  HEPARINUNFRC  --   --  2.00*  --  2.20*  --  1.65*  --   --   CREATININE 1.29*  --   --   --  1.02  --   --  1.19  --   < > = values in this interval not displayed.  Estimated Creatinine Clearance: 43.7 mL/min (by C-G formula based on SCr of 1.19 mg/dL).   Medical History: Past Medical History:  Diagnosis Date  . CAD (coronary artery disease)   . Diabetes ([place])   . H/O: stroke   . HTN (hypertension)     Medications:  Scheduled:  . atorvastatin  80 mg Oral Daily  . folic acid  1 mg Per Tube Daily  . furosemide  20 mg Intravenous BID  . insulin aspart  2-6 Units Subcutaneous Q4H  . mouth rinse  15 mL Mouth Rinse BID  . pantoprazole sodium  40 mg Per Tube Daily    Assessment: Patient is a 70 year old male found to be in afib w/ RVR. Pharmacy consulted to start anticoagulation w/ apixaban. Pt has a CHA2DS2-VASc score of 5. Patient requiring intubation 10/11 to transition to heparin drip.   Goal of Therapy:  APTT 68-109 HL 0.3-0.7 Monitor platelets by anticoagulation protocol: Yes   Plan:  Current orders for heparin 600 units/hr, APTT 125. No signs of bleeding per RN. Will reduce rate to 500 units/hr. Recheck heparin level in 6 hours.    Rexene Edison, PharmD, BCPS Clinical Pharmacist  07/02/2017

## 2017-07-02 NOTE — Progress Notes (Signed)
Arvil Chaco, NP informed of ABG and U/A results back from lab.

## 2017-07-02 NOTE — Progress Notes (Signed)
SUBJECTIVE: Patient is still very combative although extubated   Vitals:   07/02/17 0600 07/02/17 0700 07/02/17 0800 07/02/17 0900  BP: 104/78 104/69 118/79 119/71  Pulse: 99 86 88 92  Resp: 20 (!) 21 18 16   Temp:   (!) 97.3 F (36.3 C)   TempSrc:   Axillary   SpO2: 100% 100% 100% 96%  Weight:      Height:        Intake/Output Summary (Last 24 hours) at 07/02/17 1002 Last data filed at 07/02/17 0800  Gross per 24 hour  Intake           637.33 ml  Output             2090 ml  Net         -1452.67 ml    LABS: Basic Metabolic Panel:  Recent Labs  06/30/17 1443 07/01/17 1000 07/02/17 0305 07/02/17 0310  NA  --  139  --  139  K  --  3.3*  --  4.0  CL  --  105  --  101  CO2  --  27  --  27  GLUCOSE  --  128*  --  158*  BUN  --  26*  --  28*  CREATININE  --  1.02  --  1.19  CALCIUM  --  8.0*  --  8.2*  MG 1.9  --  2.1  --   PHOS 3.6  --  3.3  --    Liver Function Tests:  Recent Labs  07/01/17 1000 07/02/17 0310  AST 1,120* 835*  ALT 1,119* 1,117*  ALKPHOS 72 83  BILITOT 2.6* 2.8*  PROT 6.2* 6.8  ALBUMIN 3.3* 3.8   No results for input(s): LIPASE, AMYLASE in the last 72 hours. CBC:  Recent Labs  07/01/17 0615 07/02/17 0305  WBC 8.2 6.9  HGB 12.2* 13.1  HCT 36.3* 39.1*  MCV 86.0 86.0  PLT 117* 130*   Cardiac Enzymes: No results for input(s): CKTOTAL, CKMB, CKMBINDEX, TROPONINI in the last 72 hours. BNP: Invalid input(s): POCBNP D-Dimer: No results for input(s): DDIMER in the last 72 hours. Hemoglobin A1C: No results for input(s): HGBA1C in the last 72 hours. Fasting Lipid Panel: No results for input(s): CHOL, HDL, LDLCALC, TRIG, CHOLHDL, LDLDIRECT in the last 72 hours. Thyroid Function Tests: No results for input(s): TSH, T4TOTAL, T3FREE, THYROIDAB in the last 72 hours.  Invalid input(s): FREET3 Anemia Panel: No results for input(s): VITAMINB12, FOLATE, FERRITIN, TIBC, IRON, RETICCTPCT in the last 72 hours.   PHYSICAL EXAM General: Well  developed, well nourished, in no acute distress HEENT:  Normocephalic and atramatic Neck:  No JVD.  Lungs: Clear bilaterally to auscultation and percussion. Heart: HRRR . Normal S1 and S2 without gallops or murmurs.  Abdomen: Bowel sounds are positive, abdomen soft and non-tender  Msk:  Back normal, normal gait. Normal strength and tone for age. Extremities: No clubbing, cyanosis or edema.   Neuro: Alert and oriented X 3. Psych:  Good affect, responds appropriately  TELEMETRY:Remains in atrial fibrillation with controlled rate  ASSESSMENT AND PLAN: Atrial fibrillation with rapid ventricular response rate advise restarting the IV amiodarone and can be transferred to telemetry if indicated. I would like to start  entresto once blood pressure is reasonable.  Principal Problem:   New onset atrial fibrillation (HCC) Active Problems:   Diabetes (HCC)   HTN (hypertension)   CAD (coronary artery disease)   Dizziness   SOB (shortness of breath)  Encounter for central line placement    Yulisa Chirico A, MD, [facility] 07/02/2017 10:02 AM

## 2017-07-03 ENCOUNTER — Inpatient Hospital Stay: Payer: Medicare Other

## 2017-07-03 LAB — CBC
HCT: 38.8 % — ABNORMAL LOW (ref 40.0–52.0)
HEMOGLOBIN: 13.2 g/dL (ref 13.0–18.0)
MCH: 29 pg (ref 26.0–34.0)
MCHC: 34 g/dL (ref 32.0–36.0)
MCV: 85.5 fL (ref 80.0–100.0)
PLATELETS: 171 10*3/uL (ref 150–440)
RBC: 4.54 MIL/uL (ref 4.40–5.90)
RDW: 15.6 % — ABNORMAL HIGH (ref 11.5–14.5)
WBC: 8.5 10*3/uL (ref 3.8–10.6)

## 2017-07-03 LAB — BASIC METABOLIC PANEL
Anion gap: 11 (ref 5–15)
BUN: 23 mg/dL — ABNORMAL HIGH (ref 6–20)
CALCIUM: 8.3 mg/dL — AB (ref 8.9–10.3)
CO2: 28 mmol/L (ref 22–32)
CREATININE: 0.93 mg/dL (ref 0.61–1.24)
Chloride: 97 mmol/L — ABNORMAL LOW (ref 101–111)
GFR calc non Af Amer: >60 mL/min (ref >60)
Glucose, Bld: 114 mg/dL — ABNORMAL HIGH (ref 65–99)
Potassium: 3 mmol/L — ABNORMAL LOW (ref 3.5–5.1)
Sodium: 136 mmol/L (ref 135–145)

## 2017-07-03 LAB — URINE CULTURE: CULTURE: NO GROWTH

## 2017-07-03 LAB — GLUCOSE, CAPILLARY
GLUCOSE-CAPILLARY: 129 mg/dL — AB (ref 65–99)
Glucose-Capillary: 114 mg/dL — ABNORMAL HIGH (ref 65–99)
Glucose-Capillary: 132 mg/dL — ABNORMAL HIGH (ref 65–99)
Glucose-Capillary: 308 mg/dL — ABNORMAL HIGH (ref 65–99)
Glucose-Capillary: 98 mg/dL (ref 65–99)

## 2017-07-03 LAB — MAGNESIUM: Magnesium: 2.2 mg/dL (ref 1.7–2.4)

## 2017-07-03 LAB — APTT: APTT: 86 s — AB (ref 24–36)

## 2017-07-03 LAB — HEPARIN LEVEL (UNFRACTIONATED): HEPARIN UNFRACTIONATED: 0.68 [IU]/mL (ref 0.30–0.70)

## 2017-07-03 MED ORDER — SACUBITRIL-VALSARTAN 24-26 MG PO TABS
1.0000 | ORAL_TABLET | Freq: Two times a day (BID) | ORAL | Status: DC
  Administered 2017-07-03 – 2017-07-06 (×7): 1 via ORAL
  Filled 2017-07-03 (×7): qty 1

## 2017-07-03 MED ORDER — POTASSIUM CHLORIDE CRYS ER 20 MEQ PO TBCR
40.0000 meq | EXTENDED_RELEASE_TABLET | ORAL | Status: AC
  Administered 2017-07-03 (×2): 40 meq via ORAL
  Filled 2017-07-03 (×2): qty 2

## 2017-07-03 MED ORDER — DIPHENHYDRAMINE HCL 50 MG/ML IJ SOLN
25.0000 mg | Freq: Every evening | INTRAMUSCULAR | Status: DC | PRN
  Administered 2017-07-03 (×2): 25 mg via INTRAVENOUS
  Filled 2017-07-03 (×2): qty 1

## 2017-07-03 MED ORDER — PANTOPRAZOLE SODIUM 40 MG PO TBEC
40.0000 mg | DELAYED_RELEASE_TABLET | Freq: Every day | ORAL | Status: DC
  Administered 2017-07-03 – 2017-07-06 (×4): 40 mg via ORAL
  Filled 2017-07-03 (×4): qty 1

## 2017-07-03 MED ORDER — INSULIN ASPART 100 UNIT/ML ~~LOC~~ SOLN
0.0000 [IU] | Freq: Three times a day (TID) | SUBCUTANEOUS | Status: DC
  Administered 2017-07-03 – 2017-07-04 (×2): 1 [IU] via SUBCUTANEOUS
  Administered 2017-07-04: 3 [IU] via SUBCUTANEOUS
  Administered 2017-07-04: 2 [IU] via SUBCUTANEOUS
  Administered 2017-07-05: 3 [IU] via SUBCUTANEOUS
  Administered 2017-07-06: 1 [IU] via SUBCUTANEOUS
  Filled 2017-07-03 (×6): qty 1

## 2017-07-03 MED ORDER — DIGOXIN 0.25 MG/ML IJ SOLN
0.5000 mg | Freq: Once | INTRAMUSCULAR | Status: AC
  Administered 2017-07-03: 0.5 mg via INTRAVENOUS
  Filled 2017-07-03: qty 2

## 2017-07-03 MED ORDER — INSULIN ASPART 100 UNIT/ML ~~LOC~~ SOLN
0.0000 [IU] | Freq: Every day | SUBCUTANEOUS | Status: DC
  Administered 2017-07-03: 4 [IU] via SUBCUTANEOUS
  Administered 2017-07-04: 3 [IU] via SUBCUTANEOUS
  Filled 2017-07-03 (×2): qty 1

## 2017-07-03 MED ORDER — APIXABAN 5 MG PO TABS
5.0000 mg | ORAL_TABLET | Freq: Two times a day (BID) | ORAL | Status: DC
  Administered 2017-07-03 – 2017-07-06 (×6): 5 mg via ORAL
  Filled 2017-07-03 (×7): qty 1

## 2017-07-03 NOTE — Progress Notes (Signed)
SUBJECTIVE: Patient is feeling much better more alert today and moving out of ICU   Vitals:   07/03/17 0700 07/03/17 0800 07/03/17 0900 07/03/17 1000  BP: 129/81 140/87 126/76 122/89  Pulse: (!) 107 (!) 118 (!) 120 (!) 120  Resp: (!) 23 16 16 14   Temp:  98.4 F (36.9 C)    TempSrc:  Oral    SpO2: 100% 95% 95% 94%  Weight:      Height:        Intake/Output Summary (Last 24 hours) at 07/03/17 1051 Last data filed at 07/03/17 1000  Gross per 24 hour  Intake          1991.52 ml  Output             2875 ml  Net          -883.48 ml    LABS: Basic Metabolic Panel:  Recent Labs  06/30/17 1443  07/02/17 0305 07/02/17 0310 07/03/17 0448  NA  --   < >  --  139 136  K  --   < >  --  4.0 3.0*  CL  --   < >  --  101 97*  CO2  --   < >  --  27 28  GLUCOSE  --   < >  --  158* 114*  BUN  --   < >  --  28* 23*  CREATININE  --   < >  --  1.19 0.93  CALCIUM  --   < >  --  8.2* 8.3*  MG 1.9  --  2.1  --   --   PHOS 3.6  --  3.3  --   --   < > = values in this interval not displayed. Liver Function Tests:  Recent Labs  07/01/17 1000 07/02/17 0310  AST 1,120* 835*  ALT 1,119* 1,117*  ALKPHOS 72 83  BILITOT 2.6* 2.8*  PROT 6.2* 6.8  ALBUMIN 3.3* 3.8   No results for input(s): LIPASE, AMYLASE in the last 72 hours. CBC:  Recent Labs  07/02/17 0305 07/03/17 0440  WBC 6.9 8.5  HGB 13.1 13.2  HCT 39.1* 38.8*  MCV 86.0 85.5  PLT 130* 171   Cardiac Enzymes: No results for input(s): CKTOTAL, CKMB, CKMBINDEX, TROPONINI in the last 72 hours. BNP: Invalid input(s): POCBNP D-Dimer: No results for input(s): DDIMER in the last 72 hours. Hemoglobin A1C: No results for input(s): HGBA1C in the last 72 hours. Fasting Lipid Panel: No results for input(s): CHOL, HDL, LDLCALC, TRIG, CHOLHDL, LDLDIRECT in the last 72 hours. Thyroid Function Tests: No results for input(s): TSH, T4TOTAL, T3FREE, THYROIDAB in the last 72 hours.  Invalid input(s): FREET3 Anemia Panel: No results  for input(s): VITAMINB12, FOLATE, FERRITIN, TIBC, IRON, RETICCTPCT in the last 72 hours.   PHYSICAL EXAM General: Well developed, well nourished, in no acute distress HEENT:  Normocephalic and atramatic Neck:  No JVD.  Lungs: Clear bilaterally to auscultation and percussion. Heart: HRRR . Normal S1 and S2 without gallops or murmurs.  Abdomen: Bowel sounds are positive, abdomen soft and non-tender  Msk:  Back normal, normal gait. Normal strength and tone for age. Extremities: No clubbing, cyanosis or edema.   Neuro: Alert and oriented X 3. Psych:  Good affect, responds appropriately  TELEMETRY:Atrial fibrillation with rapid ventricular response rate about 1:30  ASSESSMENT AND PLAN: Atrial fibrillation with rapid ventricular response rate and ventricular rate of 130. Advise giving digoxin 1 dose loading  of 0.5 mg IV. Tomorrow we will start the patient on entersto to her blood pressure is stable. Patient is not converting to sinus rhythm and may need TEE and cardioversion tomorrow or day after. But will need to be rescheduled.  Principal Problem:   New onset atrial fibrillation (HCC) Active Problems:   Diabetes (HCC)   HTN (hypertension)   CAD (coronary artery disease)   Dizziness   SOB (shortness of breath)   Encounter for central line placement    Bodhi Moradi A, MD, [facility] 07/03/2017 10:51 AM

## 2017-07-03 NOTE — Consult Note (Signed)
[place] for heparin Indication: atrial fibrillation  No Known Allergies  Patient Measurements: Height: 5\' 5"  (165.1 cm) Weight: 121 lb 7.6 oz (55.1 kg) IBW/kg (Calculated) : 61.5 Heparin Dosing Weight: 59.4 kg  Vital Signs: Temp: 98.4 F (36.9 C) (10/14 0800) Temp Source: Oral (10/14 0800) BP: 140/87 (10/14 0800) Pulse Rate: 118 (10/14 0800)  Labs:  Recent Labs  06/30/17 1240  07/01/17 0615 07/01/17 1000  07/02/17 0305 07/02/17 0310 07/02/17 1225 07/02/17 2031 07/03/17 0440 07/03/17 0810  HGB 13.0  --  12.2*  --   --  13.1  --   --   --  13.2  --   HCT 39.1*  --  36.3*  --   --  39.1*  --   --   --  38.8*  --   PLT 140*  --  117*  --   --  130*  --   --   --  171  --   APTT  --   < >  --  107*  < > >160*  --  125* 66*  --  86*  HEPARINUNFRC  --   < >  --  2.20*  --  1.65*  --   --  1.05*  --  0.68  CREATININE 1.29*  --   --  1.02  --   --  1.19  --   --   --   --   < > = values in this interval not displayed.  Estimated Creatinine Clearance: 43.7 mL/min (by C-G formula based on SCr of 1.19 mg/dL).   Medical History: Past Medical History:  Diagnosis Date  . CAD (coronary artery disease)   . Diabetes ([place])   . H/O: stroke   . HTN (hypertension)     Medications:  Scheduled:  . folic acid  1 mg Per Tube Daily  . furosemide  20 mg Intravenous BID  . insulin aspart  0-5 Units Subcutaneous QHS  . insulin aspart  0-9 Units Subcutaneous TID WC  . mouth rinse  15 mL Mouth Rinse BID  . pantoprazole  40 mg Oral Daily    Assessment: Patient is a 71 year old male found to be in afib w/ RVR. Pharmacy consulted to start anticoagulation w/ apixaban. Pt has a CHA2DS2-VASc score of 5. Patient requiring intubation 10/11 to transition to heparin drip.   Goal of Therapy:  APTT 68-109 HL 0.3-0.7 Monitor platelets by anticoagulation protocol: Yes   Plan:  Current orders for heparin 500 units/hr. Heparin level and APTT therapeutic  and correlate well. Can begin monitoring with heparin level only. Continue current rate, will check confirmatory level in 8 hours. CBC with AM labs.   Rexene Edison, PharmD, BCPS Clinical Pharmacist  07/03/2017

## 2017-07-03 NOTE — Progress Notes (Signed)
MEDICATION RELATED CONSULT NOTE - INITIAL   Pharmacy Consult for electrolyte, constipation, and glucose management for 71 yo male ICU patient. Patient is currently ordered furosemide 20 mg IV BID.  Plan: Replace potassium with potassium chloride 40 mEq PO Q 4 hours x 2 doses. Will recheck electrolytes with AM labs.  Last bowel movement on 10/13. Patient with diet orders, will continue to follow.  Patient is on hyperglycemia protocol. Will continue to follow.   No Known Allergies  Patient Measurements: Height: 5\' 5"  (165.1 cm) Weight: 121 lb 7.6 oz (55.1 kg) IBW/kg (Calculated) : 61.5  Vital Signs: Temp: 98.4 F (36.9 C) (10/14 0800) Temp Source: Oral (10/14 0800) BP: 140/87 (10/14 0800) Pulse Rate: 118 (10/14 0800) Intake/Output from previous day: 10/13 0701 - 10/14 0700 In: 1953.2 [P.O.:1320; I.V.:633.2] Out: 2300 [Urine:2300] Intake/Output from this shift: Total I/O In: 21.7 [I.V.:21.7] Out: -   Labs:  Recent Labs  06/30/17 1240 06/30/17 1443  07/01/17 0615 07/01/17 1000  07/02/17 0305 07/02/17 0310 07/02/17 1225 07/02/17 2031 07/03/17 0440 07/03/17 0810  WBC 10.9*  --   --  8.2  --   --  6.9  --   --   --  8.5  --   HGB 13.0  --   --  12.2*  --   --  13.1  --   --   --  13.2  --   HCT 39.1*  --   --  36.3*  --   --  39.1*  --   --   --  38.8*  --   PLT 140*  --   --  117*  --   --  130*  --   --   --  171  --   APTT  --   --   < >  --  107*  < > >160*  --  125* 66*  --  86*  CREATININE 1.29*  --   --   --  1.02  --   --  1.19  --   --   --   --   MG  --  1.9  --   --   --   --  2.1  --   --   --   --   --   PHOS  --  3.6  --   --   --   --  3.3  --   --   --   --   --   ALBUMIN 3.9  --   --   --  3.3*  --   --  3.8  --   --   --   --   PROT 6.9  --   --   --  6.2*  --   --  6.8  --   --   --   --   AST 1,335*  --   --   --  1,120*  --   --  835*  --   --   --   --   ALT 1,018*  --   --   --  1,119*  --   --  1,117*  --   --   --   --   ALKPHOS 85  --    --   --  72  --   --  83  --   --   --   --   BILITOT 2.8*  --   --   --  2.6*  --   --  2.8*  --   --   --   --   < > = values in this interval not displayed. Estimated Creatinine Clearance: 43.7 mL/min (by C-G formula based on SCr of 1.19 mg/dL).   Medical History: Past Medical History:  Diagnosis Date  . CAD (coronary artery disease)   . Diabetes ([place])   . H/O: stroke   . HTN (hypertension)      Pharmacy will continue to monitor and adjust per consult.  Marquett Bertoli C 07/03/2017,9:56 AM

## 2017-07-03 NOTE — Progress Notes (Signed)
[facility] Progess Note     ASSESSMENT/PLAN  Acute hypoxic respiratory failure Atrial fibrillation Cardiomyopathy Elevated LFT Diabetes Hypertension  PLAN Patient doing well clinically. Will transfer out to floor.  Follow liver enzymes. Advance diet. RUQ USG results noted.   Dimas Chyle MD PCCM    SUBJECTIVE:  Patient seen and examined at bedside. No acute events overnight. No episodes of agitation as per nursing. Tolerating oral intake.   VITAL SIGNS: Temp:  [97.9 F (36.6 C)-98.6 F (37 C)] 98.5 F (36.9 C) (10/14 2036) Pulse Rate:  [105-133] 110 (10/14 2036) Resp:  [14-23] 18 (10/14 2036) BP: (106-141)/(74-100) 106/74 (10/14 2036) SpO2:  [91 %-100 %] 96 % (10/14 2036)   PHYSICAL EXAMINATION: Physical Examination:   VS: BP 106/74 (BP Location: Right Arm)   Pulse (!) 110   Temp 98.5 F (36.9 C) (Oral)   Resp 18   Ht 5\' 5"  (1.651 m)   Wt 121 lb 7.6 oz (55.1 kg)   SpO2 96%   BMI 20.21 kg/m    General Appearance: No distress  Neuro: without focal findings, mental status normal. HEENT: PERRLA, EOM intact. Pulmonary: Decreased air entry noted bilaterally Cardiovascular: Normal S1,S2.  No m/r/g.   Abdomen: Benign, Soft, non-tender. Extremities: normal, no cyanosis, clubbing.    LABORATORY PANEL:   CBC  Recent Labs Lab 07/03/17 0440  WBC 8.5  HGB 13.2  HCT 38.8*  PLT 171    Chemistries   Recent Labs Lab 07/02/17 0305 07/02/17 0310 07/03/17 0448 07/03/17 1627  NA  --  139 136  --   K  --  4.0 3.0*  --   CL  --  101 97*  --   CO2  --  27 28  --   GLUCOSE  --  158* 114*  --   BUN  --  28* 23*  --   CREATININE  --  1.19 0.93  --   CALCIUM  --  8.2* 8.3*  --   MG 2.1  --   --  2.2  PHOS 3.3  --   --   --   AST  --  835*  --   --   ALT  --  1,117*  --   --   ALKPHOS  --  83  --   --   BILITOT  --  2.8*  --   --      Recent Labs Lab 07/02/17 1618 07/02/17 1952 07/02/17 2347 07/03/17 0543  07/03/17 0819 07/03/17 1655  GLUCAP 117* 119* 98 114* 129* 132*    Recent Labs Lab 06/30/17 0801 06/30/17 1130 07/02/17 0318  PHART 7.48* 7.42 7.46*  PCO2ART 30* 30* 35  PO2ART 62* 94 74*    Recent Labs Lab 06/30/17 1240 07/01/17 1000 07/02/17 0310  AST 1,335* 1,120* 835*  ALT 1,018* 1,119* 1,117*  ALKPHOS 85 72 83  BILITOT 2.8* 2.6* 2.8*  ALBUMIN 3.9 3.3* 3.8    Cardiac Enzymes  Recent Labs Lab 06/27/17 2152  TROPONINI <0.03    RADIOLOGY:  Dg Chest Port 1 View  Result Date: 07/02/2017 CLINICAL DATA:  Acute respiratory failure. History of atrial fibrillation, CAD, diabetes, hypertension and stroke. EXAM: PORTABLE CHEST 1 VIEW COMPARISON:  07/01/2017; 06/30/2017; 06/27/2017 FINDINGS: Grossly unchanged cardiac silhouette and mediastinal contours post median sternotomy and CABG. Interval extubation and removal of enteric tube. Interval removal of right jugular approach intravenous catheter. Otherwise, stable position of remaining support apparatus. The pulmonary vasculature remains indistinct with  cephalization of flow. Unchanged small layering effusions and associated bibasilar opacities, left greater than right. No pneumothorax. No acute osseus abnormalities. Mild-to-moderate degenerative change of the right glenohumeral joint, incompletely evaluated. IMPRESSION: 1. Interval extubation and removal of enteric tube and right jugular approach central venous catheter. No pneumothorax. 2. Similar findings of pulmonary edema, small effusions and associated bibasilar opacities, left greater than right, atelectasis versus infiltrate. Electronically Signed   By: Sandi Mariscal M.D.   On: 07/02/2017 09:27   US Abdomen Limited Ruq  Result Date: 07/03/2017 CLINICAL DATA:  Abnormal liver function tests EXAM: ULTRASOUND ABDOMEN LIMITED RIGHT UPPER QUADRANT COMPARISON:  None. FINDINGS: Gallbladder: Diffuse sludge. No shadowing stone is noted. No wall thickening or focal tenderness. Common  bile duct: Diameter: 2.5 mm.  Where visualized, no filling defect. Liver: No focal lesion identified. Within normal limits in parenchymal echogenicity. Portal vein is patent on color Doppler imaging with normal direction of blood flow towards the liver. Patent and antegrade flow in the visualized hepatic venous system. Right pleural effusion is noted. Rounded, distended appearing hepatic cava. IMPRESSION: 1. Gallbladder sludge.  No evidence of cholecystitis. 2. No focal hepatic abnormality. 3. Rounded/distended hepatic cava, question elevated right heart pressures. Right pleural effusion is present. Electronically Signed   By: Monte Fantasia M.D.   On: 07/03/2017 09:36        Irelyn Perfecto Lyndel Safe MD   07/03/2017

## 2017-07-03 NOTE — Progress Notes (Signed)
Patient ID: Walter Blair, male   DOB: [birth year], 71 y.o.   MRN: [id number]   [facility] PROGRESS NOTE  Walter Blair ZJI:[id number] DOB: 1945/10/31 DOA: 06/27/2017 PCP: Jodi Marble, MD  HPI/Subjective: Patient feels okay. States his thinking is not quite clear at this point but better than yesterday. He has a history of prior stroke and has some trouble speaking and left-sided weakness at baseline.  Objective: Vitals:   07/03/17 1200 07/03/17 1220  BP: (!) 132/96 132/82  Pulse: (!) 120 (!) 122  Resp: 15 18  Temp:  97.9 F (36.6 C)  SpO2: 94% 95%    Filed Weights   06/28/17 0128 07/01/17 0500 07/02/17 0500  Weight: 59.4 kg (131 lb) 57.3 kg (126 lb 5.2 oz) 55.1 kg (121 lb 7.6 oz)    ROS: Review of Systems  Constitutional: Negative for chills and fever.  Eyes: Negative for blurred vision.  Respiratory: Positive for shortness of breath. Negative for cough.   Cardiovascular: Negative for chest pain.  Gastrointestinal: Negative for abdominal pain, constipation, diarrhea, nausea and vomiting.  Genitourinary: Negative for dysuria.  Musculoskeletal: Negative for joint pain.  Neurological: Negative for dizziness and headaches.   Exam: Physical Exam  HENT:  Nose: No mucosal edema.  Mouth/Throat: No oropharyngeal exudate or posterior oropharyngeal edema.  Eyes: Pupils are equal, round, and reactive to light. Conjunctivae, EOM and lids are normal.  Neck: No JVD present. Carotid bruit is not present. No edema present. No thyroid mass and no thyromegaly present.  Cardiovascular: S1 normal and S2 normal.  An irregularly irregular rhythm present. Exam reveals no gallop.   Murmur heard.  Systolic murmur is present with a grade of 2/6  Pulses:      Dorsalis pedis pulses are 2+ on the right side, and 2+ on the left side.  Respiratory: No respiratory distress. He has no wheezes. He has no rhonchi. He has rales in the right lower field and the left lower field.  GI: Soft.  Bowel sounds are normal. There is no tenderness.  Musculoskeletal:       Right ankle: He exhibits no swelling.       Left ankle: He exhibits no swelling.  Lymphadenopathy:    He has no cervical adenopathy.  Neurological: He is alert. No cranial nerve deficit.  Skin: Skin is warm. No rash noted. Nails show no clubbing.  Psychiatric: He is slowed.      Data Reviewed: Basic Metabolic Panel:  Recent Labs Lab 06/30/17 0157 06/30/17 1240 06/30/17 1443 07/01/17 1000 07/02/17 0305 07/02/17 0310 07/03/17 0448  NA 138 138  --  139  --  139 136  K 3.9 4.1  --  3.3*  --  4.0 3.0*  CL 102 103  --  105  --  101 97*  CO2 26 19*  --  27  --  27 28  GLUCOSE 148* 105*  --  128*  --  158* 114*  BUN 22* 25*  --  26*  --  28* 23*  CREATININE 1.19 1.29*  --  1.02  --  1.19 0.93  CALCIUM 8.5* 8.6*  --  8.0*  --  8.2* 8.3*  MG 2.1  --  1.9  --  2.1  --   --   PHOS  --   --  3.6  --  3.3  --   --    Liver Function Tests:  Recent Labs Lab 06/30/17 1240 07/01/17 1000 07/02/17 0310  AST 1,335* 1,120* 835*  ALT 1,018* 1,119* 1,117*  ALKPHOS 85 72 83  BILITOT 2.8* 2.6* 2.8*  PROT 6.9 6.2* 6.8  ALBUMIN 3.9 3.3* 3.8    Recent Labs Lab 07/02/17 0305  AMMONIA 11   CBC:  Recent Labs Lab 06/30/17 0157 06/30/17 1240 07/01/17 0615 07/02/17 0305 07/03/17 0440  WBC 9.1 10.9* 8.2 6.9 8.5  HGB 12.6* 13.0 12.2* 13.1 13.2  HCT 36.6* 39.1* 36.3* 39.1* 38.8*  MCV 84.9 86.2 86.0 86.0 85.5  PLT 158 140* 117* 130* 171   Cardiac Enzymes:  Recent Labs Lab 06/27/17 2152  TROPONINI <0.03   BNP (last 3 results)  Recent Labs  07/01/17 0615  BNP 344.0*     CBG:  Recent Labs Lab 07/02/17 1618 07/02/17 1952 07/02/17 2347 07/03/17 0543 07/03/17 0819  GLUCAP 117* 119* 98 114* 129*    Recent Results (from the past 240 hour(s))  MRSA PCR Screening     Status: None   Collection Time: 06/30/17 11:03 AM  Result Value Ref Range Status   MRSA by PCR NEGATIVE NEGATIVE Final     Comment:        The GeneXpert MRSA Assay (FDA approved for NASAL specimens only), is one component of a comprehensive MRSA colonization surveillance program. It is not intended to diagnose MRSA infection nor to guide or monitor treatment for MRSA infections.   Urine Culture     Status: None   Collection Time: 07/02/17  3:25 AM  Result Value Ref Range Status   Specimen Description URINE, RANDOM  Final   Special Requests NONE  Final   Culture   Final    NO GROWTH Performed at [facility], [address], [place], [place] [address]    Report Status 07/03/2017 FINAL  Final     Studies: Dg Chest Port 1 View  Result Date: 07/02/2017 CLINICAL DATA:  Acute respiratory failure. History of atrial fibrillation, CAD, diabetes, hypertension and stroke. EXAM: PORTABLE CHEST 1 VIEW COMPARISON:  07/01/2017; 06/30/2017; 06/27/2017 FINDINGS: Grossly unchanged cardiac silhouette and mediastinal contours post median sternotomy and CABG. Interval extubation and removal of enteric tube. Interval removal of right jugular approach intravenous catheter. Otherwise, stable position of remaining support apparatus. The pulmonary vasculature remains indistinct with cephalization of flow. Unchanged small layering effusions and associated bibasilar opacities, left greater than right. No pneumothorax. No acute osseus abnormalities. Mild-to-moderate degenerative change of the right glenohumeral joint, incompletely evaluated. IMPRESSION: 1. Interval extubation and removal of enteric tube and right jugular approach central venous catheter. No pneumothorax. 2. Similar findings of pulmonary edema, small effusions and associated bibasilar opacities, left greater than right, atelectasis versus infiltrate. Electronically Signed   By: Sandi Mariscal M.D.   On: 07/02/2017 09:27   US Abdomen Limited Ruq  Result Date: 07/03/2017 CLINICAL DATA:  Abnormal liver function tests EXAM: ULTRASOUND ABDOMEN LIMITED RIGHT UPPER  QUADRANT COMPARISON:  None. FINDINGS: Gallbladder: Diffuse sludge. No shadowing stone is noted. No wall thickening or focal tenderness. Common bile duct: Diameter: 2.5 mm.  Where visualized, no filling defect. Liver: No focal lesion identified. Within normal limits in parenchymal echogenicity. Portal vein is patent on color Doppler imaging with normal direction of blood flow towards the liver. Patent and antegrade flow in the visualized hepatic venous system. Right pleural effusion is noted. Rounded, distended appearing hepatic cava. IMPRESSION: 1. Gallbladder sludge.  No evidence of cholecystitis. 2. No focal hepatic abnormality. 3. Rounded/distended hepatic cava, question elevated right heart pressures. Right pleural effusion is present. Electronically Signed  By: Monte Fantasia M.D.   On: 07/03/2017 09:36    Scheduled Meds: . apixaban  5 mg Oral BID  . folic acid  1 mg Per Tube Daily  . furosemide  20 mg Intravenous BID  . insulin aspart  0-5 Units Subcutaneous QHS  . insulin aspart  0-9 Units Subcutaneous TID WC  . pantoprazole  40 mg Oral Daily  . potassium chloride  40 mEq Oral Q4H  . sacubitril-valsartan  1 tablet Oral BID   Continuous Infusions: . amiodarone 30 mg/hr (07/03/17 1200)  . heparin 500 Units/hr (07/03/17 1200)    Assessment/Plan:  1. Acute delirium.  Avoid benzodiazepines. Haldol when necessary. 2. Atrial fibrillation with rapid ventricular response.  Patient placed back on amiodarone drip. Given 1 dose of IV digoxin. Spoke with pharmacist to switch the patient off heparin drip and back on Eliquis this evening. 3. Very elevated liver function tests. I discontinued atorvastatin. This could also be passive congestion from CHF. Reviewed ultrasound the liver test results. Check hepatitis profiles tomorrow morning and liver function tests tomorrow morning. 4. 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 jection fraction 10-15% wit mitral  regurgitation.  Continue diuresis with Lasix. Cardiology started [place]. 5. Type 2 diabetes mellitus on sliding scale insulin 6. History of stroke on heparin drip for now, converting over to Eliquis later this evening  Code Status:     Code Status Orders        Start     Ordered   06/28/17 0115  Full code  Continuous     06/28/17 0114    Code Status History    Date Active Date Inactive Code Status Order ID Comments User Context   This patient has a current code status but no historical code status.    Advance Directive Documentation     Most Recent Value  Type of Advance Directive  Healthcare Power of Attorney, Living will  Pre-existing out of facility DNR order (yellow form or pink MOST form)  -  "MOST" Form in Place?  -     Family Communication: family at the bedside Disposition Plan: to be determined  Consultants:  Critical care specialist  Cardiology  Time spent: 28 minutes  [place], [place]

## 2017-07-03 NOTE — Progress Notes (Signed)
Patient rested during shift.  No behavior concerns.  No concerns.

## 2017-07-03 NOTE — Progress Notes (Signed)
Pt accepted in transfer from [facility]. He is alert a/o and painfree. Family accompanied him. amio gtt and hep gtt infusing. Pt oriented to new room. Lunch taken only fairly. Family to bring in food to supplement. Foley draining. Tele a fib in low hundreds.

## 2017-07-04 ENCOUNTER — Inpatient Hospital Stay: Payer: Medicare Other

## 2017-07-04 ENCOUNTER — Encounter: Payer: Self-pay | Admitting: Cardiovascular Disease

## 2017-07-04 DIAGNOSIS — F4321 Adjustment disorder with depressed mood: Secondary | ICD-10-CM

## 2017-07-04 LAB — CBC
HCT: 47.3 % (ref 40.0–52.0)
HEMOGLOBIN: 15.8 g/dL (ref 13.0–18.0)
MCH: 28.6 pg (ref 26.0–34.0)
MCHC: 33.5 g/dL (ref 32.0–36.0)
MCV: 85.4 fL (ref 80.0–100.0)
PLATELETS: 203 10*3/uL (ref 150–440)
RBC: 5.54 MIL/uL (ref 4.40–5.90)
RDW: 15.9 % — AB (ref 11.5–14.5)
WBC: 9.5 10*3/uL (ref 3.8–10.6)

## 2017-07-04 LAB — COMPREHENSIVE METABOLIC PANEL
ALT: 571 U/L — AB (ref 17–63)
AST: 160 U/L — ABNORMAL HIGH (ref 15–41)
Albumin: 3.7 g/dL (ref 3.5–5.0)
Alkaline Phosphatase: 77 U/L (ref 38–126)
Anion gap: 9 (ref 5–15)
BILIRUBIN TOTAL: 2 mg/dL — AB (ref 0.3–1.2)
BUN: 18 mg/dL (ref 6–20)
CHLORIDE: 102 mmol/L (ref 101–111)
CO2: 27 mmol/L (ref 22–32)
CREATININE: 0.92 mg/dL (ref 0.61–1.24)
Calcium: 8.8 mg/dL — ABNORMAL LOW (ref 8.9–10.3)
Glucose, Bld: 185 mg/dL — ABNORMAL HIGH (ref 65–99)
Potassium: 3.6 mmol/L (ref 3.5–5.1)
Sodium: 138 mmol/L (ref 135–145)
TOTAL PROTEIN: 7.5 g/dL (ref 6.5–8.1)

## 2017-07-04 LAB — BLOOD GAS, ARTERIAL
Acid-base deficit: 0.4 mmol/L (ref 0.0–2.0)
Bicarbonate: 22.3 mmol/L (ref 20.0–28.0)
FIO2: 0.24
O2 SAT: 93.1 %
PCO2 ART: 30 mmHg — AB (ref 32.0–48.0)
PH ART: 7.48 — AB (ref 7.350–7.450)
PO2 ART: 62 mmHg — AB (ref 83.0–108.0)
Patient temperature: 37

## 2017-07-04 LAB — GLUCOSE, CAPILLARY
GLUCOSE-CAPILLARY: 164 mg/dL — AB (ref 65–99)
GLUCOSE-CAPILLARY: 209 mg/dL — AB (ref 65–99)
GLUCOSE-CAPILLARY: 292 mg/dL — AB (ref 65–99)
Glucose-Capillary: 139 mg/dL — ABNORMAL HIGH (ref 65–99)

## 2017-07-04 MED ORDER — METOPROLOL TARTRATE 25 MG PO TABS
25.0000 mg | ORAL_TABLET | Freq: Two times a day (BID) | ORAL | Status: DC
  Administered 2017-07-04 (×2): 25 mg via ORAL
  Filled 2017-07-04 (×2): qty 1

## 2017-07-04 MED ORDER — DIGOXIN 0.25 MG/ML IJ SOLN
0.2500 mg | Freq: Every day | INTRAMUSCULAR | Status: DC
  Administered 2017-07-04: 0.25 mg via INTRAVENOUS
  Filled 2017-07-04 (×2): qty 1

## 2017-07-04 MED ORDER — DIPHENHYDRAMINE HCL 25 MG PO CAPS
50.0000 mg | ORAL_CAPSULE | Freq: Every evening | ORAL | Status: DC | PRN
  Administered 2017-07-04: 50 mg via ORAL
  Filled 2017-07-04: qty 2

## 2017-07-04 MED ORDER — MENTHOL 3 MG MT LOZG
1.0000 | LOZENGE | OROMUCOSAL | Status: DC | PRN
  Administered 2017-07-04 – 2017-07-05 (×6): 3 mg via ORAL
  Filled 2017-07-04: qty 9

## 2017-07-04 MED ORDER — POLYETHYLENE GLYCOL 3350 17 G PO PACK
17.0000 g | PACK | Freq: Every day | ORAL | Status: DC
  Administered 2017-07-04: 17 g via ORAL
  Filled 2017-07-04 (×2): qty 1

## 2017-07-04 MED ORDER — SODIUM CHLORIDE 0.9 % IV SOLN
INTRAVENOUS | Status: DC
  Administered 2017-07-04: 13:00:00 via INTRAVENOUS

## 2017-07-04 MED ORDER — SODIUM CHLORIDE 0.9% FLUSH
3.0000 mL | Freq: Two times a day (BID) | INTRAVENOUS | Status: DC
  Administered 2017-07-04 – 2017-07-06 (×3): 3 mL via INTRAVENOUS

## 2017-07-04 MED ORDER — DIPHENHYDRAMINE HCL 25 MG PO CAPS: 25.0000 mg | ORAL_CAPSULE | Freq: Every evening | ORAL | Status: DC | PRN

## 2017-07-04 MED ORDER — SODIUM CHLORIDE 0.9 % IV SOLN: 250.0000 mL | INTRAVENOUS | Status: DC

## 2017-07-04 MED ORDER — SODIUM CHLORIDE 0.9 % IV SOLN
INTRAVENOUS | Status: DC
  Administered 2017-07-04 – 2017-07-05 (×3): via INTRAVENOUS

## 2017-07-04 MED ORDER — SODIUM CHLORIDE 0.9% FLUSH: 3.0000 mL | INTRAVENOUS | Status: DC | PRN

## 2017-07-04 NOTE — Progress Notes (Signed)
SUBJECTIVE: Pt is alert, sitting in bed, feeling well except he report episodes of visual disturbances reporting that "the room appears upside down."    Vitals:   07/03/17 2230 07/04/17 0449 07/04/17 0458 07/04/17 1007  BP: 119/76  114/73 116/76  Pulse: (!) 107  (!) 43 (!) 109  Resp:   18 19  Temp:   97.9 F (36.6 C)   TempSrc:   Oral   SpO2:   98% 96%  Weight:  128 lb 11.2 oz (58.4 kg)    Height:        Intake/Output Summary (Last 24 hours) at 07/04/17 1017 Last data filed at 07/04/17 0450  Gross per 24 hour  Intake           207.62 ml  Output             2700 ml  Net         -2492.38 ml    LABS: Basic Metabolic Panel:  Recent Labs  07/02/17 0305  07/03/17 0448 07/03/17 1627 07/04/17 0550  NA  --   < > 136  --  138  K  --   < > 3.0*  --  3.6  CL  --   < > 97*  --  102  CO2  --   < > 28  --  27  GLUCOSE  --   < > 114*  --  185*  BUN  --   < > 23*  --  18  CREATININE  --   < > 0.93  --  0.92  CALCIUM  --   < > 8.3*  --  8.8*  MG 2.1  --   --  2.2  --   PHOS 3.3  --   --   --   --   < > = values in this interval not displayed. Liver Function Tests:  Recent Labs  07/02/17 0310 07/04/17 0550  AST 835* 160*  ALT 1,117* 571*  ALKPHOS 83 77  BILITOT 2.8* 2.0*  PROT 6.8 7.5  ALBUMIN 3.8 3.7   No results for input(s): LIPASE, AMYLASE in the last 72 hours. CBC:  Recent Labs  07/03/17 0440 07/04/17 0550  WBC 8.5 9.5  HGB 13.2 15.8  HCT 38.8* 47.3  MCV 85.5 85.4  PLT 171 203   Cardiac Enzymes: No results for input(s): CKTOTAL, CKMB, CKMBINDEX, TROPONINI in the last 72 hours. BNP: Invalid input(s): POCBNP D-Dimer: No results for input(s): DDIMER in the last 72 hours. Hemoglobin A1C: No results for input(s): HGBA1C in the last 72 hours. Fasting Lipid Panel: No results for input(s): CHOL, HDL, LDLCALC, TRIG, CHOLHDL, LDLDIRECT in the last 72 hours. Thyroid Function Tests: No results for input(s): TSH, T4TOTAL, T3FREE, THYROIDAB in the last 72  hours.  Invalid input(s): FREET3 Anemia Panel: No results for input(s): VITAMINB12, FOLATE, FERRITIN, TIBC, IRON, RETICCTPCT in the last 72 hours.   PHYSICAL EXAM General: Well developed, well nourished, in no acute distress HEENT:  Normocephalic and atramatic Neck:  No JVD.  Lungs: Clear bilaterally to auscultation and percussion. Heart: Irregular rhythm, rapid rate Abdomen: Bowel sounds are positive, abdomen soft and non-tender  Msk:  Back normal, normal gait. Normal strength and tone for age. Extremities: No clubbing, cyanosis or edema.   Neuro: Alert and oriented X 3. Psych:  Good affect, responds appropriately  TELEMETRY: Afib 108 ventricular rate  ASSESSMENT AND PLAN: Persistent atrial fibrillation with rapid ventricular response and congestive heart failure with severe systolic  dysfunction. Plan to reschedule TEE and cardioversion for today or tomorrow. Will consider alternatives to amiodarone due to possible visual disturbances.   Principal Problem:   New onset atrial fibrillation (HCC) Active Problems:   Diabetes ([place])   HTN (hypertension)   CAD (coronary artery disease)   Dizziness   SOB (shortness of breath)   Encounter for central line placement    Jake Bathe, NP-C 07/04/2017 10:17 AM

## 2017-07-04 NOTE — Progress Notes (Signed)
MEDICATION RELATED CONSULT NOTE - INITIAL   Pharmacy Consult for DDI monitoring/recommendations with amiodarone  No Known Allergies  Patient Measurements: Height: 5\' 5"  (165.1 cm) Weight: 128 lb 11.2 oz (58.4 kg) IBW/kg (Calculated) : 61.5   Estimated Creatinine Clearance: 60 mL/min (by C-G formula based on SCr of 0.92 mg/dL).  Medical History: Past Medical History:  Diagnosis Date  . CAD (coronary artery disease)   . Diabetes ([place])   . H/O: stroke   . HTN (hypertension)     Medications:  Scheduled:  . apixaban  5 mg Oral BID  . digoxin  0.25 mg Intravenous Daily  . folic acid  1 mg Per Tube Daily  . furosemide  20 mg Intravenous BID  . insulin aspart  0-5 Units Subcutaneous QHS  . insulin aspart  0-9 Units Subcutaneous TID WC  . pantoprazole  40 mg Oral Daily  . polyethylene glycol  17 g Oral Daily  . sacubitril-valsartan  1 tablet Oral BID  . sodium chloride flush  3 mL Intravenous Q12H    Assessment: Patient started on amiodarone for new onset atrial fibrillation.  Plan:  Risk of prolonged QTc with amiodarone, haloperidol PRN, and ondansetron PRN. Recommend monitoring. Of note, patient received one dose of haloperidol on 10/13 and has not received any PRN ondansetron doses.  Amiodarone can increase concentration of atorvastatin. Statin is currently being held 2/2 elevated LFT.  Lenis Noon, PharmD, BCPS Clinical Pharmacist 07/04/2017,11:46 AM

## 2017-07-04 NOTE — Plan of Care (Signed)
Problem: Physical Regulation: Goal: Ability to maintain clinical measurements within normal limits will improve Outcome: Not Progressing Most recent ALT level = 571. Will continue to monitor. Wenda Low [facility]

## 2017-07-04 NOTE — Progress Notes (Signed)
MEDICATION RELATED CONSULT NOTE - INITIAL   Pharmacy Consult for electrolyte management. Patient is currently ordered furosemide 20 mg IV BID.  Plan: Electrolytes WNL today. No supplementation necessary at this time. Will recheck electrolytes with AM labs tomorrow.  No Known Allergies  Patient Measurements: Height: 5\' 5"  (165.1 cm) Weight: 128 lb 11.2 oz (58.4 kg) IBW/kg (Calculated) : 61.5  Vital Signs: Temp: 97.9 F (36.6 C) (10/15 0458) Temp Source: Oral (10/15 0458) BP: 116/76 (10/15 1007) Pulse Rate: 109 (10/15 1007) Intake/Output from previous day: 10/14 0701 - 10/15 0700 In: 272.7 [I.V.:272.7] Out: 5366 [Urine:3275] Intake/Output from this shift: No intake/output data recorded.  Labs:  Recent Labs  07/02/17 0305 07/02/17 0310 07/02/17 1225 07/02/17 2031 07/03/17 0440 07/03/17 0448 07/03/17 0810 07/03/17 1627 07/04/17 0550  WBC 6.9  --   --   --  8.5  --   --   --  9.5  HGB 13.1  --   --   --  13.2  --   --   --  15.8  HCT 39.1*  --   --   --  38.8*  --   --   --  47.3  PLT 130*  --   --   --  171  --   --   --  203  APTT >160*  --  125* 66*  --   --  86*  --   --   CREATININE  --  1.19  --   --   --  0.93  --   --  0.92  MG 2.1  --   --   --   --   --   --  2.2  --   PHOS 3.3  --   --   --   --   --   --   --   --   ALBUMIN  --  3.8  --   --   --   --   --   --  3.7  PROT  --  6.8  --   --   --   --   --   --  7.5  AST  --  835*  --   --   --   --   --   --  160*  ALT  --  1,117*  --   --   --   --   --   --  571*  ALKPHOS  --  83  --   --   --   --   --   --  77  BILITOT  --  2.8*  --   --   --   --   --   --  2.0*   Lab Results  Component Value Date   K 3.6 07/04/2017     Estimated Creatinine Clearance: 60 mL/min (by C-G formula based on SCr of 0.92 mg/dL).   Pharmacy will continue to monitor and adjust per consult.  Darylene Price Nao Linz 07/04/2017,11:41 AM

## 2017-07-04 NOTE — Progress Notes (Signed)
PT Cancellation Note  Patient Details Name: Walter Blair MRN: [id number] DOB: 1946/06/26   Cancelled Treatment:    Reason Eval/Treat Not Completed: Patient declined, no reason specified. Treatment attempted; pt declined due to not feeling well; notes visual disturbances that are intolerable currently. Re attempt at a later time/date as the schedule allows.   Larae Grooms, PTA 07/04/2017, 12:36 PM

## 2017-07-04 NOTE — Consult Note (Signed)
[facility] Face-to-Face Psychiatry Consult   Reason for Consult:  Consult for 71 year old man without past psychiatric history. Reason for consult appears to be concern over his mood although there also have been concerns about delirium. Referring Physician:  Earleen Newport Patient Identification: Walter Blair MRN:  [id number] Principal Diagnosis: Grief Diagnosis:   Patient Active Problem List   Diagnosis Date Noted  . Grief [F43.21] 07/04/2017  . Dizziness [R42]   . SOB (shortness of breath) [R06.02]   . Encounter for central line placement [Z45.2]   . New onset atrial fibrillation ([place]) [I48.91] 06/27/2017  . Diabetes ([place]) [E11.9] 06/27/2017  . HTN (hypertension) [I10] 06/27/2017  . CAD (coronary artery disease) [I25.10] 06/27/2017    Total Time spent with patient: 1 hour  Subjective:   Walter Blair is a 71 y.o. male patient admitted with "I felt unsteady".  HPI:  Patient interviewed chart reviewed. Spoke briefly with his nephews as well. 56 year old man with no past psychiatric history who is currently in the hospital because of new atrial fibrillation and dizziness. Patient was briefly in the intensive care unit and had a couple of spells of confusion and delirium. That seems to be improving. On interview the patient says he feels like his mood is doing fairly okay. He is anxious about the heart but has a lot of confidence in his treatment team. Patient was willing discuss the recent death of his wife. Wife passed away last month after a 3 year struggle with cancer. Patient appears to have appropriate emotions about it. He says that it was not as bad as it could've been because he knew how long she had suffered.since her death he has been staying on himself part of the time but it sounds like he spends a great deal of time at the home of his brother as well. He admits that his appetite has been decreased and he has lost some weight. Sleep at times has been interrupted. Nevertheless still enjoys  normal activities. Enjoys being around his family. Denies any suicidal thoughts at all. Patient showed a full range of affect during the interview with appropriate smiling and laughing at times. As far as the current "hallucinations", patient says he is still seeing things move in his room. He describes how he will be watching the television and suddenly it will seem as though the entire wall has shifted to the floor. He is aware thiss is a physiologic symptom. Currently on no psychiatric medicine.Judging from the chart it looks like Ativan may have made him more delirious on at least one occasion  Social history: Patient is a retired Optometrist. Has lived in the [place] since 1969 Has multiple siblings in the [place] as well.seems to have good relationship with his extended family. Nephews are close by and clearly concerned in an appropriate way. Patient has no children of his own. Wife passed away last month.  Medical history: History of coronary artery disease, history of stroke. New-onset atrial fibrillation. Diabetes.  Substance abuse: None   Past Psychiatric History: no past psychiatric history. No history of psychiatric hospitalization. Not aware of ever being on any psychiatric medicin No history of suicidality no history of psychosis.  Risk to Self: Is patient at risk for suicide?: No Risk to Others:   Prior Inpatient Therapy:   Prior Outpatient Therapy:    Past Medical History:  Past Medical History:  Diagnosis Date  . CAD (coronary artery disease)   . Diabetes ([place])   . H/O: stroke   .  HTN (hypertension)     Past Surgical History:  Procedure Laterality Date  . CARDIAC DEFIBRILLATOR PLACEMENT    . CARDIOVERSION N/A 07/01/2017   Procedure: CARDIOVERSION;  Surgeon: Dionisio David, MD;  Location: [facility] ORS;  Service: Cardiovascular;  Laterality: N/A;  . CORONARY ARTERY BYPASS GRAFT    . TEE WITHOUT CARDIOVERSION N/A 07/01/2017   Procedure: TRANSESOPHAGEAL  ECHOCARDIOGRAM (TEE);  Surgeon: Dionisio David, MD;  Location: [facility] ORS;  Service: Cardiovascular;  Laterality: N/A;   Family History:  Family History  Problem Relation Age of Onset  . Diabetes Brother   . Hypertension Mother   . Diabetes Sister    Family Psychiatric  History: he is not aware of there being any Social History:  History  Alcohol Use No     History  Drug Use No    Social History   Social History  . Marital status: Widowed    Spouse name: N/A  . Number of children: N/A  . Years of education: N/A   Social History Main Topics  . Smoking status: Never Smoker  . Smokeless tobacco: Never Used  . Alcohol use No  . Drug use: No  . Sexual activity: Not Asked   Other Topics Concern  . None   Social History Narrative  . None   Additional Social History:    Allergies:  No Known Allergies  Labs:  Results for orders placed or performed during the hospital encounter of 06/27/17 (from the past 48 hour(s))  Heparin level (unfractionated)     Status: Abnormal   Collection Time: 07/02/17  8:31 PM  Result Value Ref Range   Heparin Unfractionated 1.05 (H) 0.30 - 0.70 IU/mL    Comment:        IF HEPARIN RESULTS ARE BELOW EXPECTED VALUES, AND PATIENT DOSAGE HAS BEEN CONFIRMED, SUGGEST FOLLOW UP TESTING OF ANTITHROMBIN III LEVELS.   APTT     Status: Abnormal   Collection Time: 07/02/17  8:31 PM  Result Value Ref Range   aPTT 66 (H) 24 - 36 seconds    Comment:        IF BASELINE aPTT IS ELEVATED, SUGGEST PATIENT RISK ASSESSMENT BE USED TO DETERMINE APPROPRIATE ANTICOAGULANT THERAPY.   Glucose, capillary     Status: None   Collection Time: 07/02/17 11:47 PM  Result Value Ref Range   Glucose-Capillary 98 65 - 99 mg/dL  CBC     Status: Abnormal   Collection Time: 07/03/17  4:40 AM  Result Value Ref Range   WBC 8.5 3.8 - 10.6 K/uL   RBC 4.54 4.40 - 5.90 MIL/uL   Hemoglobin 13.2 13.0 - 18.0 g/dL   HCT 38.8 (L) 40.0 - 52.0 %   MCV 85.5 80.0 - 100.0 fL    MCH 29.0 26.0 - 34.0 pg   MCHC 34.0 32.0 - 36.0 g/dL   RDW 15.6 (H) 11.5 - 14.5 %   Platelets 171 150 - 440 K/uL  Basic metabolic panel     Status: Abnormal   Collection Time: 07/03/17  4:48 AM  Result Value Ref Range   Sodium 136 135 - 145 mmol/L   Potassium 3.0 (L) 3.5 - 5.1 mmol/L   Chloride 97 (L) 101 - 111 mmol/L   CO2 28 22 - 32 mmol/L   Glucose, Bld 114 (H) 65 - 99 mg/dL   BUN 23 (H) 6 - 20 mg/dL   Creatinine, Ser 0.93 0.61 - 1.24 mg/dL   Calcium 8.3 (L) 8.9 - 10.3 mg/dL  GFR calc non Af Amer >60 >60 mL/min   GFR calc Af Amer >60 >60 mL/min    Comment: (NOTE) The eGFR has been calculated using the CKD EPI equation. This calculation has not been validated in all clinical situations. eGFR's persistently <60 mL/min signify possible Chronic Kidney Disease.    Anion gap 11 5 - 15  Glucose, capillary     Status: Abnormal   Collection Time: 07/03/17  5:43 AM  Result Value Ref Range   Glucose-Capillary 114 (H) 65 - 99 mg/dL  Heparin level (unfractionated)     Status: None   Collection Time: 07/03/17  8:10 AM  Result Value Ref Range   Heparin Unfractionated 0.68 0.30 - 0.70 IU/mL    Comment:        IF HEPARIN RESULTS ARE BELOW EXPECTED VALUES, AND PATIENT DOSAGE HAS BEEN CONFIRMED, SUGGEST FOLLOW UP TESTING OF ANTITHROMBIN III LEVELS.   APTT     Status: Abnormal   Collection Time: 07/03/17  8:10 AM  Result Value Ref Range   aPTT 86 (H) 24 - 36 seconds    Comment:        IF BASELINE aPTT IS ELEVATED, SUGGEST PATIENT RISK ASSESSMENT BE USED TO DETERMINE APPROPRIATE ANTICOAGULANT THERAPY.   Glucose, capillary     Status: Abnormal   Collection Time: 07/03/17  8:19 AM  Result Value Ref Range   Glucose-Capillary 129 (H) 65 - 99 mg/dL  Magnesium     Status: None   Collection Time: 07/03/17  4:27 PM  Result Value Ref Range   Magnesium 2.2 1.7 - 2.4 mg/dL  Glucose, capillary     Status: Abnormal   Collection Time: 07/03/17  4:55 PM  Result Value Ref Range    Glucose-Capillary 132 (H) 65 - 99 mg/dL  Glucose, capillary     Status: Abnormal   Collection Time: 07/03/17  8:39 PM  Result Value Ref Range   Glucose-Capillary 308 (H) 65 - 99 mg/dL  CBC     Status: Abnormal   Collection Time: 07/04/17  5:50 AM  Result Value Ref Range   WBC 9.5 3.8 - 10.6 K/uL   RBC 5.54 4.40 - 5.90 MIL/uL   Hemoglobin 15.8 13.0 - 18.0 g/dL   HCT 47.3 40.0 - 52.0 %   MCV 85.4 80.0 - 100.0 fL   MCH 28.6 26.0 - 34.0 pg   MCHC 33.5 32.0 - 36.0 g/dL   RDW 15.9 (H) 11.5 - 14.5 %   Platelets 203 150 - 440 K/uL  Comprehensive metabolic panel     Status: Abnormal   Collection Time: 07/04/17  5:50 AM  Result Value Ref Range   Sodium 138 135 - 145 mmol/L   Potassium 3.6 3.5 - 5.1 mmol/L   Chloride 102 101 - 111 mmol/L   CO2 27 22 - 32 mmol/L   Glucose, Bld 185 (H) 65 - 99 mg/dL   BUN 18 6 - 20 mg/dL   Creatinine, Ser 0.92 0.61 - 1.24 mg/dL   Calcium 8.8 (L) 8.9 - 10.3 mg/dL   Total Protein 7.5 6.5 - 8.1 g/dL   Albumin 3.7 3.5 - 5.0 g/dL   AST 160 (H) 15 - 41 U/L   ALT 571 (H) 17 - 63 U/L   Alkaline Phosphatase 77 38 - 126 U/L   Total Bilirubin 2.0 (H) 0.3 - 1.2 mg/dL   GFR calc non Af Amer >60 >60 mL/min   GFR calc Af Amer >60 >60 mL/min    Comment: (NOTE)  The eGFR has been calculated using the CKD EPI equation. This calculation has not been validated in all clinical situations. eGFR's persistently <60 mL/min signify possible Chronic Kidney Disease.    Anion gap 9 5 - 15  Glucose, capillary     Status: Abnormal   Collection Time: 07/04/17  8:03 AM  Result Value Ref Range   Glucose-Capillary 164 (H) 65 - 99 mg/dL  Glucose, capillary     Status: Abnormal   Collection Time: 07/04/17 11:45 AM  Result Value Ref Range   Glucose-Capillary 139 (H) 65 - 99 mg/dL  Glucose, capillary     Status: Abnormal   Collection Time: 07/04/17  4:10 PM  Result Value Ref Range   Glucose-Capillary 209 (H) 65 - 99 mg/dL    Current Facility-Administered Medications  Medication  Dose Route Frequency Provider Last Rate Last Dose  . 0.9 %  sodium chloride infusion   Intravenous Continuous Jake Bathe, FNP 20 mL/hr at 07/04/17 1238    . [START ON 07/05/2017] 0.9 %  sodium chloride infusion   Intravenous Continuous Jake Bathe, FNP      . 0.9 %  sodium chloride infusion  250 mL Intravenous Continuous Jake Bathe, FNP      . acetaminophen (TYLENOL) tablet 650 mg  650 mg Oral Q6H PRN Lance Coon, MD       Or  . acetaminophen (TYLENOL) suppository 650 mg  650 mg Rectal Q6H PRN Lance Coon, MD      . albuterol (PROVENTIL) (2.5 MG/3ML) 0.083% nebulizer solution  2.5 mg/hr Nebulization Q6H PRN Saundra Shelling, MD   2.5 mg/hr at 06/30/17 0708  . amiodarone (NEXTERONE PREMIX) 360-4.14 MG/200ML-% (1.8 mg/mL) IV infusion  30 mg/hr Intravenous Continuous Tukov, Magadalene S, NP 16.7 mL/hr at 07/04/17 1706 30 mg/hr at 07/04/17 1706  . apixaban (ELIQUIS) tablet 5 mg  5 mg Oral BID Loletha Grayer, MD   5 mg at 07/04/17 1009  . digoxin (LANOXIN) 0.25 MG/ML injection 0.25 mg  0.25 mg Intravenous Daily Neoma Laming A, MD   0.25 mg at 07/04/17 1009  . diphenhydrAMINE (BENADRYL) capsule 25 mg  25 mg Oral QHS PRN Lenis Noon, [facility]      . folic acid (FOLVITE) tablet 1 mg  1 mg Per Tube Daily Dimas Chyle, MD   1 mg at 07/04/17 1009  . furosemide (LASIX) injection 20 mg  20 mg Intravenous BID Dorene Sorrow S, NP   20 mg at 07/04/17 1703  . haloperidol lactate (HALDOL) injection 2.5 mg  2.5 mg Intravenous Q6H PRN Henreitta Leber, MD   2.5 mg at 07/02/17 0256  . insulin aspart (novoLOG) injection 0-5 Units  0-5 Units Subcutaneous QHS Dimas Chyle, MD   4 Units at 07/03/17 2110  . insulin aspart (novoLOG) injection 0-9 Units  0-9 Units Subcutaneous TID WC Dimas Chyle, MD   3 Units at 07/04/17 1646  . menthol-cetylpyridinium (CEPACOL) lozenge 3 mg  1 lozenge Oral Q3H PRN Loletha Grayer, MD   3 mg at 07/04/17 1800  . metoprolol tartrate (LOPRESSOR) tablet 25 mg  25  mg Oral BID Neoma Laming A, MD   25 mg at 07/04/17 1409  . ondansetron (ZOFRAN) tablet 4 mg  4 mg Oral Q6H PRN Lance Coon, MD       Or  . ondansetron [facility]) injection 4 mg  4 mg Intravenous Q6H PRN Lance Coon, MD      . pantoprazole (PROTONIX) EC tablet 40 mg  40 mg Oral Daily  Dimas Chyle, MD   40 mg at 07/04/17 1009  . polyethylene glycol (MIRALAX / GLYCOLAX) packet 17 g  17 g Oral Daily Loletha Grayer, MD   17 g at 07/04/17 1010  . sacubitril-valsartan (ENTRESTO) 24-26 mg per tablet  1 tablet Oral BID Neoma Laming A, MD   1 tablet at 07/04/17 1009  . sodium chloride flush (NS) 0.9 % injection 3 mL  3 mL Intravenous Q12H Jake Bathe, FNP   3 mL at 07/04/17 1200  . sodium chloride flush (NS) 0.9 % injection 3 mL  3 mL Intravenous PRN Jake Bathe, FNP        Musculoskeletal: Strength & Muscle Tone: decreased and atrophy Gait & Station: unable to stand Patient leans: N/A  Psychiatric Specialty Exam: Physical Exam  Nursing note and vitals reviewed. Constitutional: He appears well-developed. No distress.  HENT:  Head: Normocephalic and atraumatic.  Eyes: Pupils are equal, round, and reactive to light. Conjunctivae are normal.  Neck: Normal range of motion.  Respiratory: Effort normal.  GI: Soft.  Neurological: He is alert. He exhibits abnormal muscle tone.  Skin: Skin is warm and dry.  Psychiatric: He has a normal mood and affect. His speech is normal and behavior is normal. Judgment and thought content normal. Cognition and memory are normal.    Review of Systems  Constitutional: Positive for weight loss.  HENT: Negative.   Eyes: Negative.   Respiratory: Negative.   Cardiovascular: Negative.   Gastrointestinal: Negative.   Musculoskeletal: Negative.   Skin: Negative.   Neurological: Positive for dizziness and focal weakness.  Psychiatric/Behavioral: Positive for hallucinations. Negative for depression, memory loss, substance abuse and suicidal ideas.  The patient is nervous/anxious and has insomnia.     Blood pressure 116/76, pulse (!) 109, temperature 97.9 F (36.6 C), temperature source Oral, resp. rate 19, height 5' 5" (1.651 m), weight 58.4 kg (128 lb 11.2 oz), SpO2 96 %.Body mass index is 21.42 kg/m.  General Appearance: Casual  Eye Contact:  Good  Speech:  Clear and Coherent  Volume:  Normal  Mood:  Euthymic  Affect:  Appropriate  Thought Process:  Goal Directed  Orientation:  Full (Time, Place, and Person)  Thought Content:  Logical  Suicidal Thoughts:  No  Homicidal Thoughts:  No  Memory:  Immediate;   Good Recent;   Fair Remote;   Fair  Judgement:  Intact  Insight:  Present  Psychomotor Activity:  Normal  Concentration:  Concentration: Fair  Recall:  [place] of Knowledge:  Good  Language:  Fair  Akathisia:  No  Handed:  Right  AIMS (if indicated):     Assets:  Communication Skills Desire for Improvement Financial Resources/Insurance Housing Social Support  ADL's:  Impaired  Cognition:  Impaired,  Mild  Sleep:        Treatment Plan Summary: Plan 71 year old man who has suffered the recent death of his spouse. Also having an acute hospitalization with some worrisome symptoms. It sounds like earlier in the hospital stay there was more problem with delirium. Currently the patient is showing no signs of delirium. His grief appears to be appro of a major depression. I would not suggest starting any medicines for depression at this point. I do have a little concerned about his weight loss but I would not add any medicine psychiatrically to address that at this time. Patient seems to be very sensitive to medications. He is on several medicines that could potentially cause some confusion and dizziness or  visual changes. Would suggest staying away from [place] diazepam is from the most part. I will continue to follow-up as needed.  Disposition: No evidence of imminent risk to self or others at present.   Patient does not  meet criteria for psychiatric inpatient admission. Supportive therapy provided about ongoing stressors.  Alethia Berthold, MD 07/04/2017 8:15 PM

## 2017-07-04 NOTE — Progress Notes (Signed)
PHARMACIST - PHYSICIAN COMMUNICATION  DR:   Leslye Peer  CONCERNING: IV to Oral Route Change Policy  RECOMMENDATION: This patient is receiving diphenhydramine PRN by the intravenous route.  Based on criteria approved by the Pharmacy and Therapeutics Committee, the intravenous medication(s) is/are being converted to the equivalent oral dose form(s).   DESCRIPTION: These criteria include:  The patient is eating (either orally or via tube) and/or has been taking other orally administered medications for a least 24 hours  The patient has no evidence of active gastrointestinal bleeding or impaired GI absorption (gastrectomy, short bowel, patient on TNA or NPO).  If you have questions about this conversion, please contact the [place], [facility] 07/04/2017 11:52 AM

## 2017-07-04 NOTE — Progress Notes (Signed)
Patient has complained of multiple frequent episodes of experiencing objects in the patient's room as appearing upside down including the patient's visitors and staff. Patient is concerned about these episodes as they are happening more frequently. On call MD called and made aware and ordered to continue to monitor since the patient is not reporting and blurred vision and the vitals are WNL. Will continue to monitor and endorse.

## 2017-07-04 NOTE — Progress Notes (Signed)
Patient ID: Walter Blair, male   DOB: 10-29-1945, 71 y.o.   MRN: [id number]   [facility] PROGRESS NOTE  Ankit Degregorio QPR:[id number] DOB: 16-Sep-1946 DOA: 06/27/2017 PCP: Jodi Marble, MD  HPI/Subjective: Patient had about 19 episodes of the room moving on him yesterday. He felt like he was floating up in the air and looking down at the TV.  Once his son came in front of him and he was able to focus on him things cleared up.  Objective: Vitals:   07/04/17 0458 07/04/17 1007  BP: 114/73 116/76  Pulse: (!) 43 (!) 109  Resp: 18 19  Temp: 97.9 F (36.6 C)   SpO2: 98% 96%    Filed Weights   07/01/17 0500 07/02/17 0500 07/04/17 0449  Weight: 57.3 kg (126 lb 5.2 oz) 55.1 kg (121 lb 7.6 oz) 58.4 kg (128 lb 11.2 oz)    ROS: Review of Systems  Constitutional: Negative for chills and fever.  Eyes: Negative for blurred vision.  Respiratory: Positive for shortness of breath. Negative for cough.   Cardiovascular: Negative for chest pain.  Gastrointestinal: Negative for abdominal pain, constipation, diarrhea, nausea and vomiting.  Genitourinary: Negative for dysuria.  Musculoskeletal: Negative for joint pain.  Neurological: Negative for dizziness and headaches.   Exam: Physical Exam  HENT:  Nose: No mucosal edema.  Mouth/Throat: No oropharyngeal exudate or posterior oropharyngeal edema.  Eyes: Pupils are equal, round, and reactive to light. Conjunctivae, EOM and lids are normal.  Neck: No JVD present. Carotid bruit is not present. No edema present. No thyroid mass and no thyromegaly present.  Cardiovascular: S1 normal and S2 normal.  An irregularly irregular rhythm present. Exam reveals no gallop.   Murmur heard.  Systolic murmur is present with a grade of 2/6  Pulses:      Dorsalis pedis pulses are 2+ on the right side, and 2+ on the left side.  Respiratory: No respiratory distress. He has no wheezes. He has no rhonchi. He has rales in the right lower field and the left  lower field.  GI: Soft. Bowel sounds are normal. There is no tenderness.  Musculoskeletal:       Right ankle: He exhibits no swelling.       Left ankle: He exhibits no swelling.  Lymphadenopathy:    He has no cervical adenopathy.  Neurological: He is alert. No cranial nerve deficit.  Skin: Skin is warm. No rash noted. Nails show no clubbing.  Psychiatric: He has a normal mood and affect.      Data Reviewed: Basic Metabolic Panel:  Recent Labs Lab 06/30/17 0157 06/30/17 1240 06/30/17 1443 07/01/17 1000 07/02/17 0305 07/02/17 0310 07/03/17 0448 07/03/17 1627 07/04/17 0550  NA 138 138  --  139  --  139 136  --  138  K 3.9 4.1  --  3.3*  --  4.0 3.0*  --  3.6  CL 102 103  --  105  --  101 97*  --  102  CO2 26 19*  --  27  --  27 28  --  27  GLUCOSE 148* 105*  --  128*  --  158* 114*  --  185*  BUN 22* 25*  --  26*  --  28* 23*  --  18  CREATININE 1.19 1.29*  --  1.02  --  1.19 0.93  --  0.92  CALCIUM 8.5* 8.6*  --  8.0*  --  8.2* 8.3*  --  8.8*  MG 2.1  --  1.9  --  2.1  --   --  2.2  --   PHOS  --   --  3.6  --  3.3  --   --   --   --    Liver Function Tests:  Recent Labs Lab 06/30/17 1240 07/01/17 1000 07/02/17 0310 07/04/17 0550  AST 1,335* 1,120* 835* 160*  ALT 1,018* 1,119* 1,117* 571*  ALKPHOS 85 72 83 77  BILITOT 2.8* 2.6* 2.8* 2.0*  PROT 6.9 6.2* 6.8 7.5  ALBUMIN 3.9 3.3* 3.8 3.7    Recent Labs Lab 07/02/17 0305  AMMONIA 11   CBC:  Recent Labs Lab 06/30/17 1240 07/01/17 0615 07/02/17 0305 07/03/17 0440 07/04/17 0550  WBC 10.9* 8.2 6.9 8.5 9.5  HGB 13.0 12.2* 13.1 13.2 15.8  HCT 39.1* 36.3* 39.1* 38.8* 47.3  MCV 86.2 86.0 86.0 85.5 85.4  PLT 140* 117* 130* 171 203   Cardiac Enzymes:  Recent Labs Lab 06/27/17 2152  TROPONINI <0.03   BNP (last 3 results)  Recent Labs  07/01/17 0615  BNP 344.0*     CBG:  Recent Labs Lab 07/03/17 1655 07/03/17 2039 07/04/17 0803 07/04/17 1145 07/04/17 1610  GLUCAP 132* 308* 164* 139*  209*    Recent Results (from the past 240 hour(s))  MRSA PCR Screening     Status: None   Collection Time: 06/30/17 11:03 AM  Result Value Ref Range Status   MRSA by PCR NEGATIVE NEGATIVE Final    Comment:        The GeneXpert MRSA Assay (FDA approved for NASAL specimens only), is one component of a comprehensive MRSA colonization surveillance program. It is not intended to diagnose MRSA infection nor to guide or monitor treatment for MRSA infections.   Urine Culture     Status: None   Collection Time: 07/02/17  3:25 AM  Result Value Ref Range Status   Specimen Description URINE, RANDOM  Final   Special Requests NONE  Final   Culture   Final    NO GROWTH Performed at [facility], [address], [place], [place] [address]    Report Status 07/03/2017 FINAL  Final     Studies: Ct Head Wo Contrast  Result Date: 07/04/2017 CLINICAL DATA:  Visual disturbance with objects appearing upside down EXAM: CT HEAD WITHOUT CONTRAST TECHNIQUE: Contiguous axial images were obtained from the base of the skull through the vertex without intravenous contrast. COMPARISON:  June 28, 2017 FINDINGS: Brain: There is mild diffuse atrophy. There is no intracranial mass hemorrhage, extra-axial fluid collection, or midline shift. There is evidence of a prior infarct in the right posterior frontal/anterior parietal-superior right temporal regions involving a portion of the right middle cerebral artery distribution, stable. There is mild patchy small vessel disease in the centra semiovale bilaterally. No acute infarct is appreciable on this study. Vascular: There is no evident hyperdense vessel. There is calcification in both carotid siphon regions. Skull: The bony calvarium appears intact. Sinuses/Orbits: There is mucosal thickening in several ethmoid air cells bilaterally. There is an air-fluid level in a concha bullosa on the left. There is mild leftward deviation of the nasal septum. Visualized  orbits appear symmetric bilaterally. Other: There is opacification in several mastoid air cells on the right. Mastoids are clear on the left although hypoplastic. IMPRESSION: 1. Prior infarct on the right involving portions of the right posterior frontal, superior right temporal, and anterior right parietal lobes, stable. Patchy periventricular small vessel  disease is stable. No acute infarct evident. No intracranial mass, hemorrhage, or extra-axial fluid collection. 2.  Foci of arterial vascular calcification noted. 3. Foci of paranasal sinus disease and right-sided mastoid air cell disease. Electronically Signed   By: Lowella Grip III M.D.   On: 07/04/2017 10:59   US Abdomen Limited Ruq  Result Date: 07/03/2017 CLINICAL DATA:  Abnormal liver function tests EXAM: ULTRASOUND ABDOMEN LIMITED RIGHT UPPER QUADRANT COMPARISON:  None. FINDINGS: Gallbladder: Diffuse sludge. No shadowing stone is noted. No wall thickening or focal tenderness. Common bile duct: Diameter: 2.5 mm.  Where visualized, no filling defect. Liver: No focal lesion identified. Within normal limits in parenchymal echogenicity. Portal vein is patent on color Doppler imaging with normal direction of blood flow towards the liver. Patent and antegrade flow in the visualized hepatic venous system. Right pleural effusion is noted. Rounded, distended appearing hepatic cava. IMPRESSION: 1. Gallbladder sludge.  No evidence of cholecystitis. 2. No focal hepatic abnormality. 3. Rounded/distended hepatic cava, question elevated right heart pressures. Right pleural effusion is present. Electronically Signed   By: Monte Fantasia M.D.   On: 07/03/2017 09:36    Scheduled Meds: . apixaban  5 mg Oral BID  . digoxin  0.25 mg Intravenous Daily  . folic acid  1 mg Per Tube Daily  . furosemide  20 mg Intravenous BID  . insulin aspart  0-5 Units Subcutaneous QHS  . insulin aspart  0-9 Units Subcutaneous TID WC  . metoprolol tartrate  25 mg Oral BID  .  pantoprazole  40 mg Oral Daily  . polyethylene glycol  17 g Oral Daily  . sacubitril-valsartan  1 tablet Oral BID  . sodium chloride flush  3 mL Intravenous Q12H   Continuous Infusions: . sodium chloride 20 mL/hr at 07/04/17 1238  . [START ON 07/05/2017] sodium chloride    . sodium chloride    . amiodarone 30 mg/hr (07/04/17 0448)    Assessment/Plan:  1. Acute delirium.  Avoid benzodiazepines. Now with some visual focusing issues. I asked him to concentrate with one eye versus the other if the head showed old stroke.  Patient is very sensite to medication and I'ring if medication could be the cause here. 2. Atrial fibrillation with rapid ventricular response.  Patient placed back on amiodarone drip. Also on IV digoxin. Patient back on Eliquis. 3. Very elevated liver function tests. I discontinued atorvastatin. This could also be passive congestion from CHF. Reviewed ultrasound the liver test results. Hepatitis profiles pending. Liver function tests trending better. 4. 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 jection fraction 10-15% wit mitral regurgitation.  Continue diuresis with Lasix. Cardiology started [place]. 5. Type 2 diabetes mellitus on sliding scale insulin 6. History of stroke on Eliquis.  Code Status:     Code Status Orders        Start     Ordered   06/28/17 0115  Full code  Continuous     06/28/17 0114    Code Status History    Date Active Date Inactive Code Status Order ID Comments User Context   This patient has a current code status but no historical code status.    Advance Directive Documentation     Most Recent Value  Type of Advance Directive  Healthcare Power of Attorney, Living will  Pre-existing out of facility DNR order (yellow form or pink MOST form)  -  "MOST" Form in Place?  -     Family Communication: family at the  bedside Disposition Plan: to be determined  Consultants:  Critical care  specialist  Cardiology  Time spent: 25 minutes  [place], [place]

## 2017-07-05 ENCOUNTER — Other Ambulatory Visit: Payer: Self-pay

## 2017-07-05 ENCOUNTER — Inpatient Hospital Stay: Payer: Medicare Other | Admitting: Anesthesiology

## 2017-07-05 ENCOUNTER — Inpatient Hospital Stay
Admit: 2017-07-05 | Discharge: 2017-07-05 | Disposition: A | Discharge status: Home / Self Care | Payer: Medicare Other | Attending: Registered Nurse | Admitting: Registered Nurse

## 2017-07-05 ENCOUNTER — Encounter
Admission: EM | Disposition: A | Discharge status: Home Health | Payer: Self-pay | Source: Home / Self Care | Attending: Internal Medicine

## 2017-07-05 ENCOUNTER — Inpatient Hospital Stay: Payer: Medicare Other

## 2017-07-05 ENCOUNTER — Encounter: Payer: Self-pay | Admitting: Anesthesiology

## 2017-07-05 HISTORY — PX: TEE WITHOUT CARDIOVERSION: SHX5443

## 2017-07-05 HISTORY — PX: CARDIOVERSION: EP1203

## 2017-07-05 LAB — GLUCOSE, CAPILLARY
GLUCOSE-CAPILLARY: 158 mg/dL — AB (ref 65–99)
GLUCOSE-CAPILLARY: 145 mg/dL — AB (ref 65–99)
GLUCOSE-CAPILLARY: 217 mg/dL — AB (ref 65–99)
GLUCOSE-CAPILLARY: 147 mg/dL — AB (ref 65–99)
Glucose-Capillary: 67 mg/dL (ref 65–99)
Glucose-Capillary: 124 mg/dL — ABNORMAL HIGH (ref 65–99)
Glucose-Capillary: 119 mg/dL — ABNORMAL HIGH (ref 65–99)

## 2017-07-05 LAB — PHOSPHORUS: PHOSPHORUS: 3.1 mg/dL (ref 2.5–4.6)

## 2017-07-05 LAB — HEPATITIS B CORE ANTIBODY, TOTAL: HEP B C TOTAL AB: NEGATIVE

## 2017-07-05 LAB — HEPATITIS C ANTIBODY: HCV Ab: 0.2 s/co ratio (ref 0.0–0.9)

## 2017-07-05 LAB — POTASSIUM: Potassium: 3.4 mmol/L — ABNORMAL LOW (ref 3.5–5.1)

## 2017-07-05 LAB — HEPATITIS B SURFACE ANTIGEN: Hepatitis B Surface Ag: NEGATIVE

## 2017-07-05 LAB — HEPATITIS B SURFACE ANTIBODY, QUANTITATIVE: Hepatitis B-Post: <3.1 m[IU]/mL — ABNORMAL LOW (ref >9.9)

## 2017-07-05 LAB — MAGNESIUM: MAGNESIUM: 2.3 mg/dL (ref 1.7–2.4)

## 2017-07-05 SURGERY — ECHOCARDIOGRAM, TRANSESOPHAGEAL
Anesthesia: General

## 2017-07-05 MED ORDER — METOPROLOL SUCCINATE ER 25 MG PO TB24
25.0000 mg | ORAL_TABLET | Freq: Every day | ORAL | Status: DC
  Administered 2017-07-05: 25 mg via ORAL
  Filled 2017-07-05: qty 1

## 2017-07-05 MED ORDER — DEXTROSE 50 % IV SOLN
25.0000 mL | Freq: Once | INTRAVENOUS | Status: AC
  Administered 2017-07-05: 25 mL via INTRAVENOUS
  Filled 2017-07-05: qty 50

## 2017-07-05 MED ORDER — AMIODARONE HCL 200 MG PO TABS
400.0000 mg | ORAL_TABLET | Freq: Every day | ORAL | Status: DC
  Administered 2017-07-05: 400 mg via ORAL
  Filled 2017-07-05: qty 2

## 2017-07-05 MED ORDER — ETOMIDATE 2 MG/ML IV SOLN
INTRAVENOUS | Status: DC | PRN
  Administered 2017-07-05: 8 mg via INTRAVENOUS
  Administered 2017-07-05 (×2): 2 mg via INTRAVENOUS

## 2017-07-05 MED ORDER — DIGOXIN 125 MCG PO TABS
0.1250 mg | ORAL_TABLET | Freq: Every day | ORAL | Status: DC
  Administered 2017-07-05 – 2017-07-06 (×2): 0.125 mg via ORAL
  Filled 2017-07-05 (×2): qty 1

## 2017-07-05 MED ORDER — FUROSEMIDE 20 MG PO TABS
20.0000 mg | ORAL_TABLET | Freq: Two times a day (BID) | ORAL | Status: DC
  Administered 2017-07-05 – 2017-07-06 (×3): 20 mg via ORAL
  Filled 2017-07-05 (×3): qty 1

## 2017-07-05 MED ORDER — ETOMIDATE 2 MG/ML IV SOLN
INTRAVENOUS | Status: AC
  Filled 2017-07-05: qty 10

## 2017-07-05 MED ORDER — POTASSIUM CHLORIDE CRYS ER 20 MEQ PO TBCR
40.0000 meq | EXTENDED_RELEASE_TABLET | Freq: Once | ORAL | Status: AC
  Administered 2017-07-05: 40 meq via ORAL
  Filled 2017-07-05: qty 2

## 2017-07-05 MED ORDER — LIDOCAINE VISCOUS 2 % MT SOLN
OROMUCOSAL | Status: AC
  Filled 2017-07-05: qty 15

## 2017-07-05 MED ORDER — GUAIFENESIN 100 MG/5ML PO SOLN
5.0000 mL | ORAL | Status: DC | PRN
  Filled 2017-07-05: qty 5

## 2017-07-05 MED ORDER — EPHEDRINE SULFATE 50 MG/ML IJ SOLN
INTRAMUSCULAR | Status: AC
  Filled 2017-07-05: qty 1

## 2017-07-05 MED ORDER — MIDAZOLAM HCL 2 MG/2ML IJ SOLN
INTRAMUSCULAR | Status: AC
  Filled 2017-07-05: qty 2

## 2017-07-05 NOTE — Progress Notes (Signed)
*  PRELIMINARY RESULTS* Echocardiogram Echocardiogram Transesophageal has been performed.  Sherrie Sport 07/05/2017, 8:02 AM

## 2017-07-05 NOTE — Care Management (Signed)
Patient's brother now at bedside.  Patient lives at home alone.  Patient's wife passed away 6 weeks ago.  Patient brother lives locally for support.   PCP TEJAN-SIE.  Plan for patient to discharge on Eliquis.  30 day coupon provided.  PT has assessed patient and recommends home health PT, no equipment needed. Home health agency preference was provided.  [facility] selected.  Corene Cornea with [facility] notified of heads up referral. The plan is for patient to discharge to his brothers home at [address] [place], [place].  RNCM following.

## 2017-07-05 NOTE — Anesthesia Postprocedure Evaluation (Signed)
Anesthesia Post Note  Patient: Walter Blair  Procedure(s) Performed: TRANSESOPHAGEAL ECHOCARDIOGRAM (TEE) (N/A ) CARDIOVERSION (N/A )  Patient location during evaluation: Cath Lab Anesthesia Type: General Level of consciousness: awake and alert Pain management: pain level controlled Vital Signs Assessment: post-procedure vital signs reviewed and stable Respiratory status: spontaneous breathing, nonlabored ventilation, respiratory function stable and patient connected to nasal cannula oxygen Cardiovascular status: blood pressure returned to baseline and stable Postop Assessment: no apparent nausea or vomiting Anesthetic complications: no     Last Vitals:  Vitals:   07/05/17 0815 07/05/17 0830  BP: (!) 116/100 (P) 123/77  Pulse: 61 (P) 71  Resp: (!) 21 (P) 20  Temp:    SpO2: 100%     Last Pain:  Vitals:   07/05/17 0705  TempSrc: Oral  PainSc:                  Precious Haws Piscitello

## 2017-07-05 NOTE — Anesthesia Post-op Follow-up Note (Signed)
Anesthesia QCDR form completed.        

## 2017-07-05 NOTE — Progress Notes (Signed)
MEDICATION RELATED CONSULT NOTE - INITIAL   Pharmacy Consult for electrolyte management. Furosemide has been changed to PO.  Plan: K = 3.4, will give 40 mEq PO KCl this morning. No other supplementation needed. Will continue to monitor.  No Known Allergies  Patient Measurements: Height: 5\' 5"  (165.1 cm) Weight: 121 lb (54.9 kg) IBW/kg (Calculated) : 61.5  Vital Signs: Temp: 98.2 F (36.8 C) (10/16 1008) Temp Source: Oral (10/16 1008) BP: 113/56 (10/16 1008) Pulse Rate: 60 (10/16 1008) Intake/Output from previous day: 10/15 0701 - 10/16 0700 In: 818.2 [I.V.:818.2] Out: 300 [Urine:300] Intake/Output from this shift: No intake/output data recorded.  Labs:  Recent Labs  07/02/17 1225 07/02/17 2031 07/03/17 0440 07/03/17 0448 07/03/17 0810 07/03/17 1627 07/04/17 0550 07/05/17 0546  WBC  --   --  8.5  --   --   --  9.5  --   HGB  --   --  13.2  --   --   --  15.8  --   HCT  --   --  38.8*  --   --   --  47.3  --   PLT  --   --  171  --   --   --  203  --   APTT 125* 66*  --   --  86*  --   --   --   CREATININE  --   --   --  0.93  --   --  0.92  --   MG  --   --   --   --   --  2.2  --  2.3  PHOS  --   --   --   --   --   --   --  3.1  ALBUMIN  --   --   --   --   --   --  3.7  --   PROT  --   --   --   --   --   --  7.5  --   AST  --   --   --   --   --   --  160*  --   ALT  --   --   --   --   --   --  571*  --   ALKPHOS  --   --   --   --   --   --  77  --   BILITOT  --   --   --   --   --   --  2.0*  --    Lab Results  Component Value Date   K 3.4 (L) 07/05/2017     Estimated Creatinine Clearance: 56.4 mL/min (by C-G formula based on SCr of 0.92 mg/dL).   Pharmacy will continue to monitor and adjust per consult.  Lenis Noon, PharmD 07/05/2017,10:12 AM

## 2017-07-05 NOTE — Progress Notes (Signed)
Patient reports feeling sweaty, also some mild confusion noted. CBG 67. Hypoglycemic protocol followed. 25 mL of D50 administered. Follow-up CBG 158. Patient and family given education about hypoglycemia. Will continue to monitor.

## 2017-07-05 NOTE — Transfer of Care (Signed)
Immediate Anesthesia Transfer of Care Note  Patient: Walter Blair  Procedure(s) Performed: TRANSESOPHAGEAL ECHOCARDIOGRAM (TEE) (N/A ) CARDIOVERSION (N/A )  Patient Location: PACU  Anesthesia Type:General  Level of Consciousness: awake  Airway & Oxygen Therapy: Patient Spontanous Breathing and Patient connected to nasal cannula oxygen  Post-op Assessment: Report given to RN and Post -op Vital signs reviewed and stable  Post vital signs: Reviewed and stable  Last Vitals:  Vitals:   07/05/17 0753 07/05/17 0754  BP:  138/70  Pulse: 61 (!) 58  Resp: (!) 27 12  Temp:    SpO2: 99% 99%    Last Pain:  Vitals:   07/05/17 0705  TempSrc: Oral  PainSc:          Complications: No apparent anesthesia complications

## 2017-07-05 NOTE — Anesthesia Preprocedure Evaluation (Addendum)
Anesthesia Evaluation  Patient identified by MRN, date of birth, ID band Patient confused    Reviewed: Allergy & Precautions, H&P , NPO status , Patient's Chart, lab work & pertinent test results  History of Anesthesia Complications Negative for: history of anesthetic complications  Airway Mallampati: III  TM Distance: >3 FB Neck ROM: limited    Dental  (+) Poor Dentition, Chipped, Missing   Pulmonary neg pulmonary ROS, neg shortness of breath,           Cardiovascular Exercise Tolerance: Poor hypertension, (-) angina+ CAD, + Past MI, + Cardiac Stents and + CABG  (-) DOE + Cardiac Defibrillator      Neuro/Psych negative neurological ROS  negative psych ROS   GI/Hepatic negative GI ROS, Neg liver ROS, neg GERD  ,  Endo/Other  diabetes, Type 2  Renal/GU negative Renal ROS  negative genitourinary   Musculoskeletal   Abdominal   Peds  Hematology negative hematology ROS (+)   Anesthesia Other Findings Past Medical History: No date: CAD (coronary artery disease) No date: Diabetes ([place]) No date: H/O: stroke No date: HTN (hypertension)  Past Surgical History: No date: CARDIAC DEFIBRILLATOR PLACEMENT 07/01/2017: CARDIOVERSION; N/A     Comment:  Procedure: CARDIOVERSION;  Surgeon: Dionisio David, MD;              Location: [facility] ORS;  Service: Cardiovascular;                Laterality: N/A; No date: CORONARY ARTERY BYPASS GRAFT 07/01/2017: TEE WITHOUT CARDIOVERSION; N/A     Comment:  Procedure: TRANSESOPHAGEAL ECHOCARDIOGRAM (TEE);                Surgeon: Dionisio David, MD;  Location: [facility] ORS;                Service: Cardiovascular;  Laterality: N/A;  BMI    Body Mass Index:  20.15 kg/m      Reproductive/Obstetrics negative OB ROS                            Anesthesia Physical Anesthesia Plan  ASA: IV  Anesthesia Plan: General   Post-op Pain Management:    Induction:  Intravenous  PONV Risk Score and Plan:   Airway Management Planned: Natural Airway and Nasal Cannula  Additional Equipment:   Intra-op Plan:   Post-operative Plan:   Informed Consent: I have reviewed the patients History and Physical, chart, labs and discussed the procedure including the risks, benefits and alternatives for the proposed anesthesia with the patient or authorized representative who has indicated his/her understanding and acceptance.   Dental Advisory Given  Plan Discussed with: Anesthesiologist, CRNA and Surgeon  Anesthesia Plan Comments: (Family consented  Patient and family informed that patient is higher risk for complications from anesthesia during this procedure due to their medical history and age including but not limited to post operative cognitive dysfunction.  They voiced understanding.  Family consented for risks of anesthesia including but not limited to:  - adverse reactions to medications - risk of intubation if required - damage to teeth, lips or other oral mucosa - sore throat or hoarseness - Damage to heart, brain, lungs or loss of life  They voiced understanding.)        Anesthesia Quick Evaluation

## 2017-07-05 NOTE — Progress Notes (Signed)
SUBJECTIVE: Patient is feeling much better after cardioversion   Vitals:   07/05/17 0753 07/05/17 0754 07/05/17 0806 07/05/17 0815  BP:  138/70 124/72 (!) 116/100  Pulse: 61 (!) 58 (!) 59 61  Resp: (!) 27 12 20  (!) 21  Temp:      TempSrc:      SpO2: 99% 99% 98% 100%  Weight:      Height:        Intake/Output Summary (Last 24 hours) at 07/05/17 0901 Last data filed at 07/05/17 0413  Gross per 24 hour  Intake           818.16 ml  Output                0 ml  Net           818.16 ml    LABS: Basic Metabolic Panel:  Recent Labs  07/03/17 0448 07/03/17 1627 07/04/17 0550 07/05/17 0546  NA 136  --  138  --   K 3.0*  --  3.6 3.4*  CL 97*  --  102  --   CO2 28  --  27  --   GLUCOSE 114*  --  185*  --   BUN 23*  --  18  --   CREATININE 0.93  --  0.92  --   CALCIUM 8.3*  --  8.8*  --   MG  --  2.2  --  2.3  PHOS  --   --   --  3.1   Liver Function Tests:  Recent Labs  07/04/17 0550  AST 160*  ALT 571*  ALKPHOS 77  BILITOT 2.0*  PROT 7.5  ALBUMIN 3.7   No results for input(s): LIPASE, AMYLASE in the last 72 hours. CBC:  Recent Labs  07/03/17 0440 07/04/17 0550  WBC 8.5 9.5  HGB 13.2 15.8  HCT 38.8* 47.3  MCV 85.5 85.4  PLT 171 203   Cardiac Enzymes: No results for input(s): CKTOTAL, CKMB, CKMBINDEX, TROPONINI in the last 72 hours. BNP: Invalid input(s): POCBNP D-Dimer: No results for input(s): DDIMER in the last 72 hours. Hemoglobin A1C: No results for input(s): HGBA1C in the last 72 hours. Fasting Lipid Panel: No results for input(s): CHOL, HDL, LDLCALC, TRIG, CHOLHDL, LDLDIRECT in the last 72 hours. Thyroid Function Tests: No results for input(s): TSH, T4TOTAL, T3FREE, THYROIDAB in the last 72 hours.  Invalid input(s): FREET3 Anemia Panel: No results for input(s): VITAMINB12, FOLATE, FERRITIN, TIBC, IRON, RETICCTPCT in the last 72 hours.   PHYSICAL EXAM General: Well developed, well nourished, in no acute distress HEENT:  Normocephalic and  atramatic Neck:  No JVD.  Lungs: Clear bilaterally to auscultation and percussion. Heart: HRRR . Normal S1 and S2 without gallops or murmurs.  Abdomen: Bowel sounds are positive, abdomen soft and non-tender  Msk:  Back normal, normal gait. Normal strength and tone for age. Extremities: No clubbing, cyanosis or edema.   Neuro: Alert and oriented X 3. Psych:  Good affect, responds appropriately  TELEMETRY:Sinus rhythm  ASSESSMENT AND PLAN: After transesophageal echocardiogram showing no evidence of thrombi in the left atrium and left appendage and left ventricle patient underwent cardioversion with 200 J which converted to sinus rhythm. Advise changing amiodarone IV to 400 by mouth twice a day and continue digoxin at 0.125 once a day and metoprolol 25 by mouth twice a day. Plan on discharging the patient tomorrow on this medication.  Principal Problem:   Grief Active Problems:   New  onset atrial fibrillation (HCC)   Diabetes (HCC)   HTN (hypertension)   CAD (coronary artery disease)   Dizziness   SOB (shortness of breath)   Encounter for central line placement    Nadeem Romanoski A, MD, [facility] 07/05/2017 9:01 AM

## 2017-07-05 NOTE — Care Management (Signed)
Patient confused, and not oriented to place at this time.  RNCM redirected.  Patient's brother to be here in about 10-15 minutes.  Will follow up at that time.

## 2017-07-05 NOTE — Progress Notes (Signed)
  NAMEHamlet Blair   MRN: [id number] DOB:  Aug 17, 1946   ADMIT DATE: 06/27/2017  Procedure: Electrical Cardioversion Indications:  Atrial Fibrillation and Atrial Flutter  Procedure Details:    Time Out: Verified patient identification, verified procedure, site/side was marked, verified correct patient position, special equipment/implants available, medications/allergies/relevent history reviewed, required imaging and test results available.    Patient placed on cardiac monitor, pulse oximetry, supplemental oxygen as necessary.  Sedation given:  Pacer pads placed   Cardioverted . Normal sinus rhythm Cardioverted at 200 J  Evaluation: Findings: Post procedure EKG shows: Normal sinus rhythm Complications:  Patient did well.     Dionisio David, M.D. [facility]   07/05/2017 9:03 AM

## 2017-07-05 NOTE — Progress Notes (Signed)
Patient ID: Walter Blair, male   DOB: 10-Mar-1946, 71 y.o.   MRN: [id number]   [facility] PROGRESS NOTE  Walter Blair LGX:[id number] DOB: 10-24-1945 DOA: 06/27/2017 PCP: Jodi Marble, MD  HPI/Subjective: Patient had an episode where the TV was visualized close to the ground and he felt like he was above it. It didn't last long. Much less fn the other day.  Objective: Vitals:   07/05/17 1008 07/05/17 1143  BP: (!) 113/56 (!) 116/54  Pulse: 60 66  Resp: 18 18  Temp: 98.2 F (36.8 C) 98.7 F (37.1 C)  SpO2: 98% 100%    Filed Weights   07/04/17 0449 07/05/17 0403 07/05/17 0705  Weight: 58.4 kg (128 lb 11.2 oz) 54.9 kg (121 lb 1.6 oz) 54.9 kg (121 lb)    ROS: Review of Systems  Constitutional: Negative for chills and fever.  Eyes: Negative for blurred vision.  Respiratory: Positive for cough and shortness of breath.   Cardiovascular: Negative for chest pain.  Gastrointestinal: Negative for abdominal pain, constipation, diarrhea, nausea and vomiting.  Genitourinary: Negative for dysuria.  Musculoskeletal: Negative for joint pain.  Neurological: Negative for dizziness and headaches.   Exam: Physical Exam  HENT:  Nose: No mucosal edema.  Mouth/Throat: No oropharyngeal exudate or posterior oropharyngeal edema.  Eyes: Pupils are equal, round, and reactive to light. Conjunctivae, EOM and lids are normal.  Neck: No JVD present. Carotid bruit is not present. No edema present. No thyroid mass and no thyromegaly present.  Cardiovascular: Regular rhythm, S1 normal and S2 normal.  Exam reveals no gallop.   Murmur heard.  Systolic murmur is present with a grade of 2/6  Pulses:      Dorsalis pedis pulses are 2+ on the right side, and 2+ on the left side.  Respiratory: No respiratory distress. He has no wheezes. He has no rhonchi. He has rales in the right lower field and the left lower field.  GI: Soft. Bowel sounds are normal. There is no tenderness.  Musculoskeletal:        Right ankle: He exhibits no swelling.       Left ankle: He exhibits no swelling.  Lymphadenopathy:    He has no cervical adenopathy.  Neurological: He is alert. No cranial nerve deficit.  Skin: Skin is warm. No rash noted. Nails show no clubbing.  Psychiatric: He has a normal mood and affect.      Data Reviewed: Basic Metabolic Panel:  Recent Labs Lab 06/30/17 0157 06/30/17 1240 06/30/17 1443 07/01/17 1000 07/02/17 0305 07/02/17 0310 07/03/17 0448 07/03/17 1627 07/04/17 0550 07/05/17 0546  NA 138 138  --  139  --  139 136  --  138  --   K 3.9 4.1  --  3.3*  --  4.0 3.0*  --  3.6 3.4*  CL 102 103  --  105  --  101 97*  --  102  --   CO2 26 19*  --  27  --  27 28  --  27  --   GLUCOSE 148* 105*  --  128*  --  158* 114*  --  185*  --   BUN 22* 25*  --  26*  --  28* 23*  --  18  --   CREATININE 1.19 1.29*  --  1.02  --  1.19 0.93  --  0.92  --   CALCIUM 8.5* 8.6*  --  8.0*  --  8.2* 8.3*  --  8.8*  --  MG 2.1  --  1.9  --  2.1  --   --  2.2  --  2.3  PHOS  --   --  3.6  --  3.3  --   --   --   --  3.1   Liver Function Tests:  Recent Labs Lab 06/30/17 1240 07/01/17 1000 07/02/17 0310 07/04/17 0550  AST 1,335* 1,120* 835* 160*  ALT 1,018* 1,119* 1,117* 571*  ALKPHOS 85 72 83 77  BILITOT 2.8* 2.6* 2.8* 2.0*  PROT 6.9 6.2* 6.8 7.5  ALBUMIN 3.9 3.3* 3.8 3.7    Recent Labs Lab 07/02/17 0305  AMMONIA 11   CBC:  Recent Labs Lab 06/30/17 1240 07/01/17 0615 07/02/17 0305 07/03/17 0440 07/04/17 0550  WBC 10.9* 8.2 6.9 8.5 9.5  HGB 13.0 12.2* 13.1 13.2 15.8  HCT 39.1* 36.3* 39.1* 38.8* 47.3  MCV 86.2 86.0 86.0 85.5 85.4  PLT 140* 117* 130* 171 203   Cardiac Enzymes: No results for input(s): CKTOTAL, CKMB, CKMBINDEX, TROPONINI in the last 168 hours. BNP (last 3 results)  Recent Labs  07/01/17 0615  BNP 344.0*     CBG:  Recent Labs Lab 07/05/17 0117 07/05/17 0158 07/05/17 0715 07/05/17 1006 07/05/17 1142  GLUCAP 67 158* 124* 119* 145*     Recent Results (from the past 240 hour(s))  MRSA PCR Screening     Status: None   Collection Time: 06/30/17 11:03 AM  Result Value Ref Range Status   MRSA by PCR NEGATIVE NEGATIVE Final    Comment:        The GeneXpert MRSA Assay (FDA approved for NASAL specimens only), is one component of a comprehensive MRSA colonization surveillance program. It is not intended to diagnose MRSA infection nor to guide or monitor treatment for MRSA infections.   Urine Culture     Status: None   Collection Time: 07/02/17  3:25 AM  Result Value Ref Range Status   Specimen Description URINE, RANDOM  Final   Special Requests NONE  Final   Culture   Final    NO GROWTH Performed at [facility], [address], [place], [place] [address]    Report Status 07/03/2017 FINAL  Final     Studies: Ct Head Wo Contrast  Result Date: 07/04/2017 CLINICAL DATA:  Visual disturbance with objects appearing upside down EXAM: CT HEAD WITHOUT CONTRAST TECHNIQUE: Contiguous axial images were obtained from the base of the skull through the vertex without intravenous contrast. COMPARISON:  June 28, 2017 FINDINGS: Brain: There is mild diffuse atrophy. There is no intracranial mass hemorrhage, extra-axial fluid collection, or midline shift. There is evidence of a prior infarct in the right posterior frontal/anterior parietal-superior right temporal regions involving a portion of the right middle cerebral artery distribution, stable. There is mild patchy small vessel disease in the centra semiovale bilaterally. No acute infarct is appreciable on this study. Vascular: There is no evident hyperdense vessel. There is calcification in both carotid siphon regions. Skull: The bony calvarium appears intact. Sinuses/Orbits: There is mucosal thickening in several ethmoid air cells bilaterally. There is an air-fluid level in a concha bullosa on the left. There is mild leftward deviation of the nasal septum. Visualized orbits  appear symmetric bilaterally. Other: There is opacification in several mastoid air cells on the right. Mastoids are clear on the left although hypoplastic. IMPRESSION: 1. Prior infarct on the right involving portions of the right posterior frontal, superior right temporal, and anterior right parietal lobes, stable.  Patchy periventricular small vessel disease is stable. No acute infarct evident. No intracranial mass, hemorrhage, or extra-axial fluid collection. 2.  Foci of arterial vascular calcification noted. 3. Foci of paranasal sinus disease and right-sided mastoid air cell disease. Electronically Signed   By: Lowella Grip III M.D.   On: 07/04/2017 10:59   Dg Chest Port 1 View  Result Date: 07/05/2017 CLINICAL DATA:  Increasing cough and shortness of breath. On 07/02/2017 EXAM: PORTABLE CHEST 1 VIEW COMPARISON:  1038 hours. FINDINGS: The lungs are clear without focal pneumonia, edema, pneumothorax or pleural effusion. Cardiopericardial silhouette is at upper limits of normal for size. Patient is status post CABG. Left AICD remains in place. The visualized bony structures of the thorax are intact. Telemetry leads overlie the chest. IMPRESSION: No acute cardiopulmonary findings. Electronically Signed   By: Misty Stanley M.D.   On: 07/05/2017 10:56    Scheduled Meds: . amiodarone  400 mg Oral Daily  . apixaban  5 mg Oral BID  . digoxin  0.125 mg Oral Daily  . folic acid  1 mg Per Tube Daily  . furosemide  20 mg Oral BID  . insulin aspart  0-5 Units Subcutaneous QHS  . insulin aspart  0-9 Units Subcutaneous TID WC  . metoprolol succinate  25 mg Oral QHS  . pantoprazole  40 mg Oral Daily  . polyethylene glycol  17 g Oral Daily  . sacubitril-valsartan  1 tablet Oral BID  . sodium chloride flush  3 mL Intravenous Q12H   Continuous Infusions: . sodium chloride      Assessment/Plan:  1. Acute delirium.  Avoid benzodiazepines. Episodes of visual disturbance less frequent. Could be IV  medication related.  Now on oral medications. 2. Atrial fibrillation with rapid ventricular response.  Patient successfully cardioverted this morning. Now on oral amiodarone and oral digoxin and low dose Toprol-XL. Patient back on Eliquis. 3. Very elevated liver function tests. I discontinued atorvastatin. This could also be passive congestion from CHF. Reviewed ultrasound the liver test results. Hepatitis profiles negative. Liver function tests trending better. 4. 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 Ejection fraction 10-15% wit mitral regurgitation.  Continue Entresto, Toprol-XL, Lasix. 5. Type 2 diabetes mellitus on sliding scale insulin 6. History of stroke on Eliquis. 7. Weakness. Physical therapy evaluation 8. Cough. Chest x-ray negative  Code Status:     Code Status Orders        Start     Ordered   06/28/17 0115  Full code  Continuous     06/28/17 0114    Code Status History    Date Active Date Inactive Code Status Order ID Comments User Context   This patient has a current code status but no historical code status.    Advance Directive Documentation     Most Recent Value  Type of Advance Directive  Healthcare Power of Attorney, Living will  Pre-existing out of facility DNR order (yellow form or pink MOST form)  -  "MOST" Form in Place?  -     Family Communication: family at the bedside Disposition Plan: potential disposition in next day or so depending on clinical course  Consultants:  Critical care specialist  Cardiology  Time spent: 25 minutes  [place], [place]

## 2017-07-05 NOTE — Progress Notes (Signed)
Physical Therapy Treatment Patient Details Name: Walter Blair MRN: [id number] DOB: September 26, 1945 Today's Date: 07/05/2017    History of Present Illness 71 year old male with past medical history of coronary artery disease status post bypass, diabetes, hypertension, history of previous CVA who presented to the hospital due to dizziness and noted to be in acute atrial fibrillation with rapid ventricular response.  He needed to be transfered to the CCU and intubated, extubated 10/12.    PT Comments    Patient status post cardioversion this date; orders for resumption of therapy received.  Patient able to complete gait around nursing station without report of dizziness or palpitations; appropriate chronotropic response to activity (HR 70-80s throughout session).  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  PAtient voiced understanding, reporting he has RW at home. 5x sit/stand and limited functional reach abilities indicative of increased fall risk, decreased balance reactions with functional activities. Continue to recommend home with HHPT upon discharge.   Follow Up Recommendations  Home health PT     Equipment Recommendations  None recommended by PT    Recommendations for Other Services       Precautions / Restrictions Precautions Precautions: Fall Restrictions Weight Bearing Restrictions: No    Mobility  Bed Mobility Overal bed mobility: Modified Independent                Transfers Overall transfer level: Needs assistance Equipment used: None Transfers: Sit to/from Stand Sit to Stand: Min guard            Ambulation/Gait Ambulation/Gait assistance: Min guard Ambulation Distance (Feet): 100 Feet Assistive device: 1 person hand held assist   Gait velocity: 10' walk time, 12 seconds   General Gait Details: Mildly antalgic on L with L lateral lean/weight shift  during gait cycle; slow, guarded cadence and gait performance; limited ability to complete dynamic gait activities (delayed balance reactions)   Stairs            Wheelchair Mobility    Modified Rankin (Stroke Patients Only)       Balance Overall balance assessment: Needs assistance Sitting-balance support: No upper extremity supported;Feet supported Sitting balance-Leahy Scale: Good     Standing balance support: Single extremity supported Standing balance-Leahy Scale: Fair                              Cognition Arousal/Alertness: Awake/alert Behavior During Therapy: WFL for tasks assessed/performed Overall Cognitive Status: Within Functional Limits for tasks assessed                                        Exercises Other Exercises Other Exercises: 120' with RW, cga/close sup-improved symmetry and overall stability with RW; recommend continued use with all mobility efforts at this time.  Patient voiced understanding. Other Exercises: Toilet transfer, ambulatory without assist device, cga/close sup; constantly reaching for walls/furniture with all mobility.  Static standing balance to void, cga/close sup; standing balance at sink for hand hygiene, cga/close sup with functional reach approx 3-4" from immediate BOS.  Decreased active use/attention to L UE noted with functional activities. Other Exercises: 5x sit/stand with R UE on bedrail, 10 seconds; 5x sit/stand without pulling on bedrail, 22 seconds.  Both indicative of increased fall risk, decreased LE strength/power with  functional tranfsers.  Continue to recommend use of assist device (RW) with mobility.    General Comments        Pertinent Vitals/Pain Pain Assessment: No/denies pain    Home Living                      Prior Function            PT Goals (current goals can now be found in the care plan section) Acute Rehab PT Goals Patient Stated Goal: go home PT Goal  Formulation: With patient Time For Goal Achievement: 07/16/17 Potential to Achieve Goals: Good Progress towards PT goals: Progressing toward goals    Frequency    Min 2X/week      PT Plan Current plan remains appropriate    Co-evaluation              AM-PAC PT "6 Clicks" Daily Activity  Outcome Measure  Difficulty turning over in bed (including adjusting bedclothes, sheets and blankets)?: A Little Difficulty moving from lying on back to sitting on the side of the bed? : A Little Difficulty sitting down on and standing up from a chair with arms (e.g., wheelchair, bedside commode, etc,.)?: A Little Help needed moving to and from a bed to chair (including a wheelchair)?: A Little Help needed walking in hospital room?: A Little Help needed climbing 3-5 steps with a railing? : A Lot 6 Click Score: 17    End of Session Equipment Utilized During Treatment: Gait belt Activity Tolerance: Patient limited by fatigue Patient left: in chair;with call bell/phone within reach;with nursing/sitter in room;with family/visitor present Nurse Communication: Mobility status PT Visit Diagnosis: Muscle weakness (generalized) (M62.81);Difficulty in walking, not elsewhere classified (R26.2)     Time: 9163-8466 PT Time Calculation (min) (ACUTE ONLY): 25 min  Charges:  $Gait Training: 8-22 mins $Therapeutic Activity: 8-22 mins                    G Codes:       Caydin Yeatts H. Owens Shark, PT, DPT, NCS 07/05/17, 3:23 PM [phone]

## 2017-07-05 NOTE — Care Management Important Message (Signed)
Important Message  Patient Details  Name: Walter Blair MRN: [id number] Date of Birth: 04/09/1946   Medicare Important Message Given:  Yes    Beverly Sessions, RN 07/05/2017, 4:18 PM

## 2017-07-06 LAB — HEPATIC FUNCTION PANEL
ALBUMIN: 3.2 g/dL — AB (ref 3.5–5.0)
ALK PHOS: 62 U/L (ref 38–126)
ALT: 268 U/L — AB (ref 17–63)
AST: 68 U/L — AB (ref 15–41)
BILIRUBIN TOTAL: 1.9 mg/dL — AB (ref 0.3–1.2)
Bilirubin, Direct: 0.5 mg/dL (ref 0.1–0.5)
Indirect Bilirubin: 1.4 mg/dL — ABNORMAL HIGH (ref 0.3–0.9)
Total Protein: 6.7 g/dL (ref 6.5–8.1)

## 2017-07-06 LAB — POTASSIUM: POTASSIUM: 3.5 mmol/L (ref 3.5–5.1)

## 2017-07-06 LAB — MAGNESIUM: MAGNESIUM: 2.2 mg/dL (ref 1.7–2.4)

## 2017-07-06 LAB — GLUCOSE, CAPILLARY: Glucose-Capillary: 121 mg/dL — ABNORMAL HIGH (ref 65–99)

## 2017-07-06 LAB — DIGOXIN LEVEL: Digoxin Level: 1.1 ng/mL (ref 0.8–2.0)

## 2017-07-06 MED ORDER — AMIODARONE HCL 200 MG PO TABS: ORAL_TABLET | ORAL | 0 refills | Status: DC

## 2017-07-06 MED ORDER — APIXABAN 5 MG PO TABS: 5.0000 mg | ORAL_TABLET | Freq: Two times a day (BID) | ORAL | 0 refills | Status: DC

## 2017-07-06 MED ORDER — SPIRONOLACTONE 25 MG PO TABS: 12.5000 mg | ORAL_TABLET | Freq: Every day | ORAL | Status: DC

## 2017-07-06 MED ORDER — DIGOXIN 125 MCG PO TABS: 0.1250 mg | ORAL_TABLET | Freq: Every day | ORAL | 0 refills | Status: DC

## 2017-07-06 MED ORDER — POTASSIUM CHLORIDE CRYS ER 20 MEQ PO TBCR: 40.0000 meq | EXTENDED_RELEASE_TABLET | Freq: Once | ORAL | Status: DC

## 2017-07-06 MED ORDER — SPIRONOLACTONE 25 MG PO TABS: 12.5000 mg | ORAL_TABLET | Freq: Every day | ORAL | 0 refills | Status: DC

## 2017-07-06 MED ORDER — SACUBITRIL-VALSARTAN 24-26 MG PO TABS: 1.0000 | ORAL_TABLET | Freq: Two times a day (BID) | ORAL | 0 refills | Status: DC

## 2017-07-06 MED ORDER — FUROSEMIDE 20 MG PO TABS: 20.0000 mg | ORAL_TABLET | Freq: Every day | ORAL | Status: DC

## 2017-07-06 MED ORDER — METOPROLOL SUCCINATE ER 50 MG PO TB24
50.0000 mg | ORAL_TABLET | Freq: Every day | ORAL | Status: DC
Start: 2017-07-06 — End: 2017-07-06

## 2017-07-06 MED ORDER — METOPROLOL SUCCINATE ER 50 MG PO TB24: 50.0000 mg | ORAL_TABLET | Freq: Every day | ORAL | 0 refills | Status: DC

## 2017-07-06 MED ORDER — AMIODARONE HCL 200 MG PO TABS
200.0000 mg | ORAL_TABLET | Freq: Every day | ORAL | Status: DC
  Administered 2017-07-06: 200 mg via ORAL
  Filled 2017-07-06: qty 1

## 2017-07-06 MED ORDER — POLYETHYLENE GLYCOL 3350 17 G PO PACK: 17.0000 g | PACK | Freq: Every day | ORAL | 0 refills | Status: DC | PRN

## 2017-07-06 MED ORDER — AMIODARONE HCL 200 MG PO TABS: 200.0000 mg | ORAL_TABLET | Freq: Two times a day (BID) | ORAL | Status: DC

## 2017-07-06 NOTE — Progress Notes (Addendum)
SUBJECTIVE: Pt is feeling well. No chest pain, no shortness of breath, visual disturbances have improved.    Vitals:   07/06/17 0310 07/06/17 0500 07/06/17 0755 07/06/17 0828  BP: (!) 99/52  113/72   Pulse: 62  (!) 124 (!) 124  Resp:   16   Temp: 98.4 F (36.9 C)  98.4 F (36.9 C)   TempSrc: Oral  Oral   SpO2: 95%  99%   Weight:  121 lb 3.2 oz (55 kg)    Height:        Intake/Output Summary (Last 24 hours) at 07/06/17 0845 Last data filed at 07/05/17 1340  Gross per 24 hour  Intake                0 ml  Output              400 ml  Net             -400 ml    LABS: Basic Metabolic Panel:  Recent Labs  07/04/17 0550 07/05/17 0546 07/06/17 0509  NA 138  --   --   K 3.6 3.4* 3.5  CL 102  --   --   CO2 27  --   --   GLUCOSE 185*  --   --   BUN 18  --   --   CREATININE 0.92  --   --   CALCIUM 8.8*  --   --   MG  --  2.3 2.2  PHOS  --  3.1  --    Liver Function Tests:  Recent Labs  07/04/17 0550 07/06/17 0509  AST 160* 68*  ALT 571* 268*  ALKPHOS 77 62  BILITOT 2.0* 1.9*  PROT 7.5 6.7  ALBUMIN 3.7 3.2*   No results for input(s): LIPASE, AMYLASE in the last 72 hours. CBC:  Recent Labs  07/04/17 0550  WBC 9.5  HGB 15.8  HCT 47.3  MCV 85.4  PLT 203   Cardiac Enzymes: No results for input(s): CKTOTAL, CKMB, CKMBINDEX, TROPONINI in the last 72 hours. BNP: Invalid input(s): POCBNP D-Dimer: No results for input(s): DDIMER in the last 72 hours. Hemoglobin A1C: No results for input(s): HGBA1C in the last 72 hours. Fasting Lipid Panel: No results for input(s): CHOL, HDL, LDLCALC, TRIG, CHOLHDL, LDLDIRECT in the last 72 hours. Thyroid Function Tests: No results for input(s): TSH, T4TOTAL, T3FREE, THYROIDAB in the last 72 hours.  Invalid input(s): FREET3 Anemia Panel: No results for input(s): VITAMINB12, FOLATE, FERRITIN, TIBC, IRON, RETICCTPCT in the last 72 hours.   PHYSICAL EXAM General: Well developed, well nourished, in no acute distress HEENT:   Normocephalic and atramatic Neck:  No JVD.  Lungs: Clear bilaterally to auscultation and percussion. Heart: Tachycardic, regular rhythm  Abdomen: Bowel sounds are positive, abdomen soft and non-tender  Msk:  Back normal, normal gait. Normal strength and tone for age. Extremities: No clubbing, cyanosis or edema.   Neuro: Alert and oriented X 3. Psych:  Good affect, responds appropriately  TELEMETRY: Sinus rhythm 60bpm overnight with apparent development of sinus tach 130's intermittently EKG ordered.  ASSESSMENT AND PLAN: Status post successful electrocardioversion from afib with history of severe systolic dysfunction EF 62-83%. Heart rate was noted to be in the 60s overnight but now sinus tach 130s. Digioxin level was OK at 1.1.   Addendum: EKG currently showing NSR 65bpm, QTc 501.  Increase metoprolol succinate to 50mg  daily, start aldactone 12.5mg , continue lasix 20mg , Entresto 24-26, and digoxin 0.125. Plan  for discharge today. Follow up with Dr. Humphrey Rolls on Friday, appt given and confirmed with pt and his family.   Principal Problem:   Grief Active Problems:   New onset atrial fibrillation (HCC)   Diabetes ([place])   HTN (hypertension)   CAD (coronary artery disease)   Dizziness   SOB (shortness of breath)   Encounter for central line placement    Jake Bathe, NP-C 07/06/2017 8:45 AM

## 2017-07-06 NOTE — Progress Notes (Addendum)
MEDICATION RELATED CONSULT NOTE - INITIAL   Pharmacy Consult for electrolyte management. Furosemide has been changed to PO.  Plan: K = 3.5, Mg = 2.2. Electrolytes are WNL. No supplementation needed at this time. Will recheck electrolytes with AM labs on 10/19.  No Known Allergies  Patient Measurements: Height: 5\' 5"  (165.1 cm) Weight: 121 lb 3.2 oz (55 kg) IBW/kg (Calculated) : 61.5  Vital Signs: Temp: 98.4 F (36.9 C) (10/17 0755) Temp Source: Oral (10/17 0755) BP: 113/72 (10/17 0755) Pulse Rate: 124 (10/17 0828) Intake/Output from previous day: 10/16 0701 - 10/17 0700 In: -  Out: 400 [Urine:400] Intake/Output from this shift: No intake/output data recorded.  Labs:  Recent Labs  07/03/17 1627 07/04/17 0550 07/05/17 0546 07/06/17 0509  WBC  --  9.5  --   --   HGB  --  15.8  --   --   HCT  --  47.3  --   --   PLT  --  203  --   --   CREATININE  --  0.92  --   --   MG 2.2  --  2.3 2.2  PHOS  --   --  3.1  --   ALBUMIN  --  3.7  --  3.2*  PROT  --  7.5  --  6.7  AST  --  160*  --  68*  ALT  --  571*  --  268*  ALKPHOS  --  77  --  62  BILITOT  --  2.0*  --  1.9*  BILIDIR  --   --   --  0.5  IBILI  --   --   --  1.4*   Lab Results  Component Value Date   K 3.5 07/06/2017     Estimated Creatinine Clearance: 56.5 mL/min (by C-G formula based on SCr of 0.92 mg/dL).   Pharmacy will continue to monitor and adjust per consult.  Lenis Noon, PharmD, BCPS Clinical Pharmacist 07/06/2017,12:54 PM

## 2017-07-06 NOTE — Care Management (Signed)
Patient to discharge home today.  Walter Blair with [facility] notified of discharge.  Eliquis coupon already given.  Notified that patient to also discharge on Entresto as well.  RNCM to provide coupon with [facility] as well.

## 2017-07-06 NOTE — Progress Notes (Signed)
Went over discharge instructions with the patient and family, including new medications and follow-up appointments. Discontinue peripheral IV and telemetry monitor. Optometrist for transport.

## 2017-07-06 NOTE — Discharge Summary (Signed)
[facility] [place] NAME: Walter Blair    MR#:  [id number]  DATE OF BIRTH:  11/14/1945  DATE OF ADMISSION:  06/27/2017 ADMITTING PHYSICIAN: Lance Coon, MD  DATE OF DISCHARGE: 07/06/2017  2:36 PM  PRIMARY CARE PHYSICIAN: Jodi Marble, MD    ADMISSION DIAGNOSIS:  Dizziness [R42] New onset atrial fibrillation (HCC) [I48.91]  DISCHARGE DIAGNOSIS:  Principal Problem:   Grief Active Problems:   New onset atrial fibrillation (HCC)   Diabetes (HCC)   HTN (hypertension)   CAD (coronary artery disease)   Dizziness   SOB (shortness of breath)   Encounter for central line placement   SECONDARY DIAGNOSIS:   Past Medical History:  Diagnosis Date  . CAD (coronary artery disease)   . Diabetes ([place])   . H/O: stroke   . HTN (hypertension)     HOSPITAL COURSE:   1.  Acute delirium. This has gradually improved during the hospital course. Patient is very sensitive to medications. The patient also had some visual disturbances which have become less frequent since he is off IV medications. This has improved. 2. Atrial fibrillation with rapid ventricular response. Patient was cardioverted on 07/05/2017 to normal sinus rhythm.  The patient had episode of sinus tachycardia. Patient's heart rate is now in the 70s upon going home. Patient will be on oral amiodarone 200 mg twice a day for 1 week and then down to 200 mg daily. Patient will be on oral digoxin and Toprol-XL at night. Patient is on Eliquis for anticoagulation. Risk of bleeding explained to patient and family. 3. Very elevated liver function tests. I discontinued atorvastatin at this time. Likely from passive congestive heart failure. Liver function tests have trended down from over thousand down to AST of 68 and a PLT of 268.  I would recheck liver function test again as outpatient prior to starting any statin dictation. 4. 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 Ejection fraction 10-15% with mitral regurgitation. Patient started on Entresto, Toprol-XL, diuresis with IV Lasix and switch over to oral Lasix, started on low-dose spironolactone also. 5. Type 2 diabetes mellitus was on sliding scale insulin while here can restart Glucophage as outpatient 6. History of stroke on Eliquis 7. Weakness. Physical therapy recommended home health. 8. Cough. Chest x-ray negative. 9. Visual disturbance while here in the hospital. This has gradually improved since being off amiodarone drip. If continues as outpatient, refer to ophthalmology. 10. Grief reaction with recent death of spouse 32. Acute respiratory failure requiring intubation secondary to CHF. Patient was extubated quickly. Off oxygen at this point  DISCHARGE CONDITIONS:   satisfactory  CONSULTS OBTAINED:  Treatment Team:  Dionisio David, MD Clapacs, Madie Reno, MD  DRUG ALLERGIES:  No Known Allergies  DISCHARGE MEDICATIONS:   Discharge Medication List as of 07/06/2017 12:35 PM    START taking these medications   Details  amiodarone (PACERONE) 200 MG tablet One tablet twice a day for one week then one tablet daily, Normal    apixaban (ELIQUIS) 5 MG TABS tablet Take 1 tablet (5 mg total) by mouth 2 (two) times daily., Starting Wed 07/06/2017, Normal    digoxin (LANOXIN) 0.125 MG tablet Take 1 tablet (0.125 mg total) by mouth daily., Starting Thu 07/07/2017, Normal    metoprolol succinate (TOPROL-XL) 50 MG 24 hr tablet Take 1 tablet (50 mg total) by mouth at bedtime. Take with or immediately following a meal., Starting Wed 07/06/2017, Normal  polyethylene glycol (MIRALAX / GLYCOLAX) packet Take 17 g by mouth daily as needed., Starting Wed 07/06/2017, Normal    sacubitril-valsartan (ENTRESTO) 24-26 MG Take 1 tablet by mouth 2 (two) times daily., Starting Wed 07/06/2017, Normal    spironolactone (ALDACTONE) 25 MG tablet Take 0.5 tablets (12.5 mg total) by mouth daily., Starting  Wed 07/06/2017, Normal      CONTINUE these medications which have NOT CHANGED   Details  folic acid (FOLVITE) 1 MG tablet Take 1 mg by mouth every morning., Historical Med    furosemide (LASIX) 20 MG tablet Take 20 mg by mouth daily., Historical Med    metFORMIN (GLUCOPHAGE-XR) 500 MG 24 hr tablet Take 500-1,000 mg by mouth 2 (two) times daily. 500mg  in the morning and 1000mg  at night, Historical Med    RA ASPIRIN EC ADULT LOW ST 81 MG EC tablet Take 81 mg by mouth daily., Historical Med      STOP taking these medications     atorvastatin (LIPITOR) 80 MG tablet      carvedilol (COREG) 3.125 MG tablet      lisinopril (PRINIVIL,ZESTRIL) 5 MG tablet          DISCHARGE INSTRUCTIONS:   Follow-up Dr. Chancy Milroy cardiology on Friday Follow-up PMD 2 weeks Follow-up CHF clinic  If you experience worsening of your admission symptoms, develop shortness of breath, life threatening emergency, suicidal or homicidal thoughts you must seek medical attention immediately by calling 911 or calling your MD immediately  if symptoms less severe.  You Must read complete instructions/literature along with all the possible adverse reactions/side effects for all the Medicines you take and that have been prescribed to you. Take any new Medicines after you have completely understood and accept all the possible adverse reactions/side effects.   Please note  You were cared for by a hospitalist during your hospital stay. If you have any questions about your discharge medications or the care you received while you were in the hospital after you are discharged, you can call the unit and asked to speak with the hospitalist on call if the hospitalist that took care of you is not available. Once you are discharged, your primary care physician will handle any further medical issues. Please note that NO REFILLS for any discharge medications will be authorized once you are discharged, as it is imperative that you return to  your primary care physician (or establish a relationship with a primary care physician if you do not have one) for your aftercare needs so that they can reassess your need for medications and monitor your lab values.    Today   CHIEF COMPLAINT:   Chief Complaint  Patient presents with  . Dizziness    HISTORY OF PRESENT ILLNESS:  Walter Blair  is a 71 y.o. male presented with dizziness and found to be in A. fib   VITAL SIGNS:  Blood pressure 113/72, temperature 98.4, respirations 16 pulse 78 on the monitor prior to discharge  I/O:  No intake or output data in the 24 hours ending 07/06/17 1624  PHYSICAL EXAMINATION:  GENERAL:  71 y.o.-year-old patient lying in the bed with no acute distress.  EYES: Pupils equal, round, reactive to light and accommodation. No scleral icterus. Extraocular muscles intact.  HEENT: Head atraumatic, normocephalic. Oropharynx and nasopharynx clear.  NECK:  Supple, no jugular venous distention. No thyroid enlargement, no tenderness.  LUNGS: Normal breath sounds bilaterally, no wheezing, rales,rhonchi or crepitation. No use of accessory muscles of respiration.  CARDIOVASCULAR:  S1, S2 normal. No murmurs, rubs, or gallops.  ABDOMEN: Soft, non-tender, non-distended. Bowel sounds present. No organomegaly or mass.  EXTREMITIES: No pedal edema, cyanosis, or clubbing.  NEUROLOGIC: Cranial nerves II through XII are intact. Muscle strength 5/5 in all extremities. Sensation intact. Gait not checked.  PSYCHIATRIC: The patient is alert and oriented x 3.  SKIN: No obvious rash, lesion, or ulcer.   DATA REVIEW:   CBC  Recent Labs Lab 07/04/17 0550  WBC 9.5  HGB 15.8  HCT 47.3  PLT 203    Chemistries   Recent Labs Lab 07/04/17 0550  07/06/17 0509  NA 138  --   --   K 3.6  < > 3.5  CL 102  --   --   CO2 27  --   --   GLUCOSE 185*  --   --   BUN 18  --   --   CREATININE 0.92  --   --   CALCIUM 8.8*  --   --   MG  --   < > 2.2  AST 160*  --  68*   ALT 571*  --  268*  ALKPHOS 77  --  62  BILITOT 2.0*  --  1.9*  < > = values in this interval not displayed.   Microbiology Results  Results for orders placed or performed during the hospital encounter of 06/27/17  MRSA PCR Screening     Status: None   Collection Time: 06/30/17 11:03 AM  Result Value Ref Range Status   MRSA by PCR NEGATIVE NEGATIVE Final    Comment:        The GeneXpert MRSA Assay (FDA approved for NASAL specimens only), is one component of a comprehensive MRSA colonization surveillance program. It is not intended to diagnose MRSA infection nor to guide or monitor treatment for MRSA infections.   Urine Culture     Status: None   Collection Time: 07/02/17  3:25 AM  Result Value Ref Range Status   Specimen Description URINE, RANDOM  Final   Special Requests NONE  Final   Culture   Final    NO GROWTH Performed at [facility], [address], [place], [place] [address]    Report Status 07/03/2017 FINAL  Final    RADIOLOGY:  Dg Chest Port 1 View  Result Date: 07/05/2017 CLINICAL DATA:  Increasing cough and shortness of breath. On 07/02/2017 EXAM: PORTABLE CHEST 1 VIEW COMPARISON:  1038 hours. FINDINGS: The lungs are clear without focal pneumonia, edema, pneumothorax or pleural effusion. Cardiopericardial silhouette is at upper limits of normal for size. Patient is status post CABG. Left AICD remains in place. The visualized bony structures of the thorax are intact. Telemetry leads overlie the chest. IMPRESSION: No acute cardiopulmonary findings. Electronically Signed   By: Misty Stanley M.D.   On: 07/05/2017 10:56      Management plans discussed with the patient, family and they are in agreement.  CODE STATUS:     Code Status Orders        Start     Ordered   06/28/17 0115  Full code  Continuous     06/28/17 0114    Code Status History    Date Active Date Inactive Code Status Order ID Comments User Context   This patient has a current  code status but no historical code status.    Advance Directive Documentation     Most Recent Value  Type of Advance Directive  Healthcare Power of Attorney  Pre-existing out of facility DNR order (yellow form or pink MOST form)  -  "MOST" Form in Place?  -      TOTAL TIME TAKING CARE OF THIS PATIENT: 35 minutes.    Loletha Grayer M.D on 07/06/2017 at 4:24 PM  Between 7am to 6pm - Pager - [phone]  After 6pm go to [email or web address] - password [email or web address]  [facility] Office  [phone]  CC: Primary care physician; Jodi Marble, MD

## 2017-07-08 ENCOUNTER — Telehealth: Payer: Self-pay

## 2017-07-08 NOTE — Telephone Encounter (Signed)
-----   Message from Alisa Graff, [place] sent at 07/07/2017 11:19 AM EDT ----- Regarding: please call to schedule appt Contact: [phone] [place] made the referral at discharge

## 2017-07-08 NOTE — Telephone Encounter (Signed)
Attempted to make appointment per referral. NO answer message left to return call.

## 2017-07-13 NOTE — Telephone Encounter (Signed)
Second attempt to reach patient in reference to referral placed at discharge. Left message on machine to call back.

## 2017-07-14 NOTE — Telephone Encounter (Signed)
Patients brother contacted the office back. I spoke with him explaining the clinic and what we do. He wants to check with Dr. Humphrey Rolls before making an appointment. States that he will call back to schedule if needed.

## 2017-09-05 ENCOUNTER — Ambulatory Visit: Payer: Self-pay

## 2018-11-04 IMAGING — CT CT HEAD W/O CM
3 of 4 series · 15 of 47 positions shown, 18 images · non-contrast
Comparison: None

CLINICAL DATA: Dizziness for 1 day, history MI, stroke,
hypertension, diabetes mellitus

EXAM:
CT HEAD WITHOUT CONTRAST
TECHNIQUE: Contiguous axial images were obtained from the base of the skull
through the vertex without intravenous contrast. Sagittal and
coronal MPR images reconstructed from axial data set.

[Series 2: head wo · axial · 0.38mm/px · z∈[-50,+60]mm · 9 of 28 slices shown, 12 images]
[im 3/28  brain]
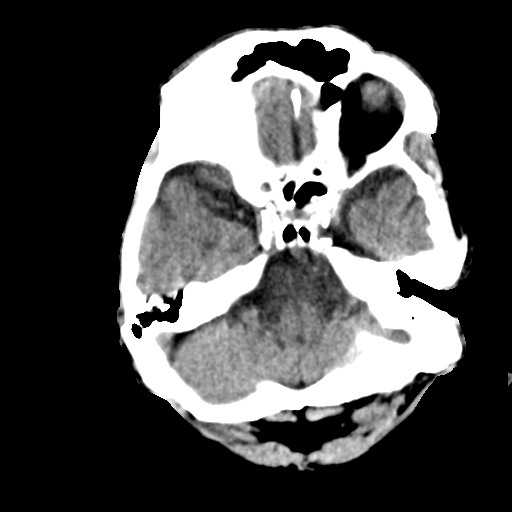
[im 3/28  bone]
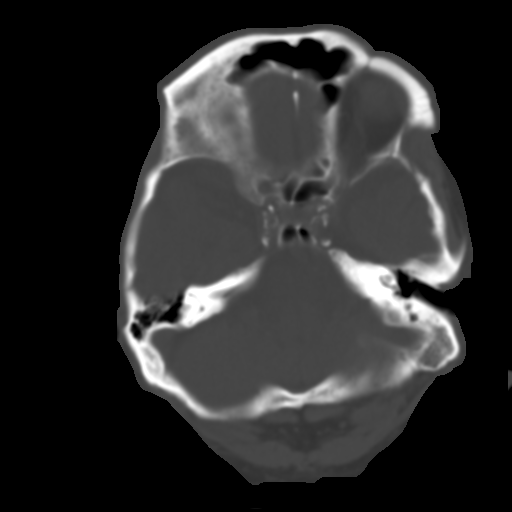
[im 5/28  brain]
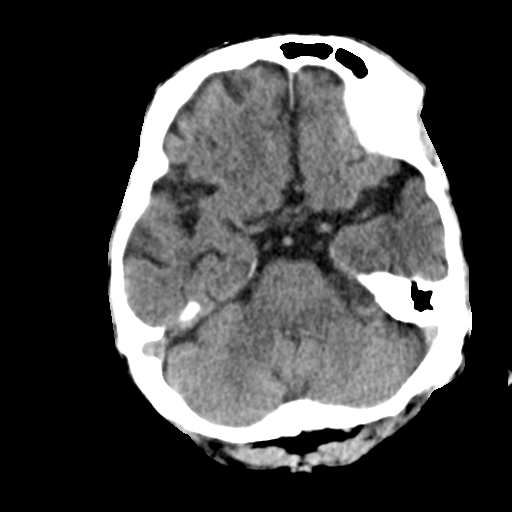
[im 8/28  brain]
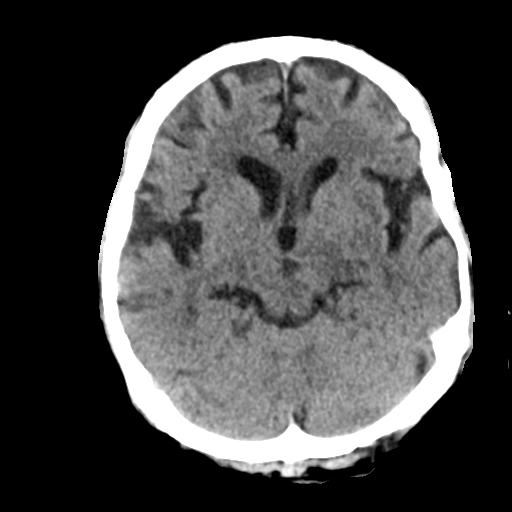
[im 11/28  brain]
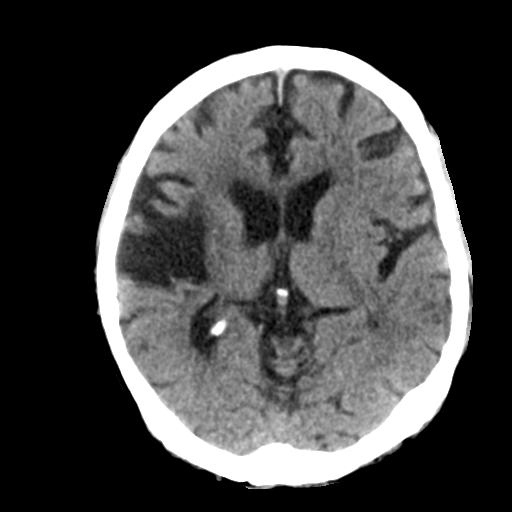
[im 14/28  brain]
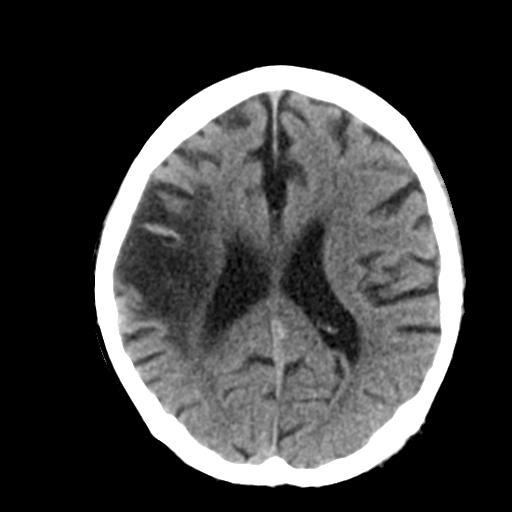
[im 14/28  bone]
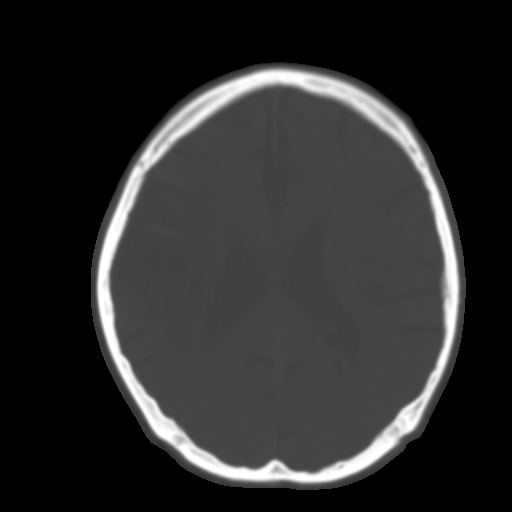
[im 17/28  brain]
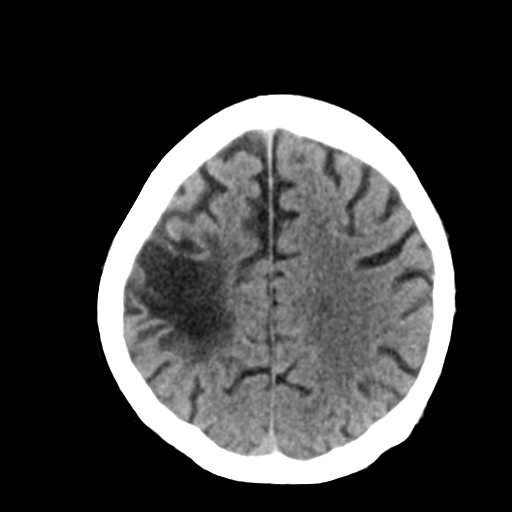
[im 20/28  brain]
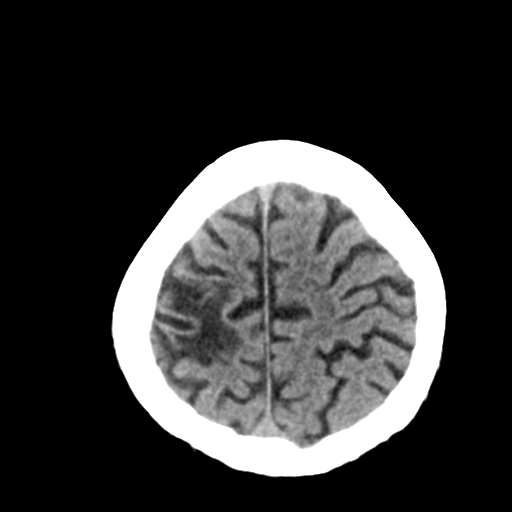
[im 23/28  brain]
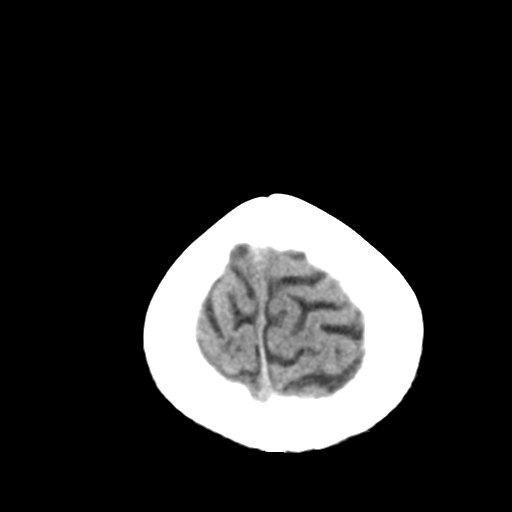
[im 25/28  brain]
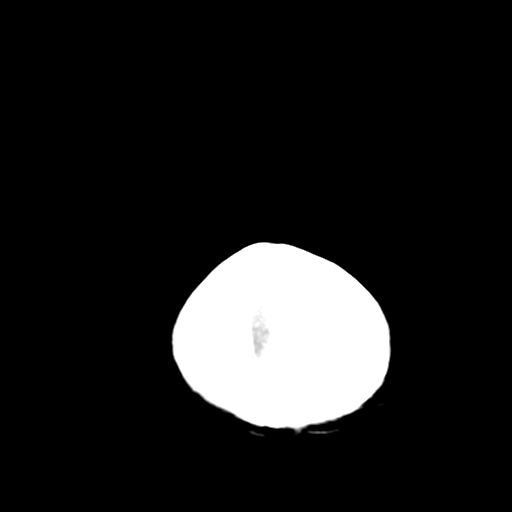
[im 25/28  bone]
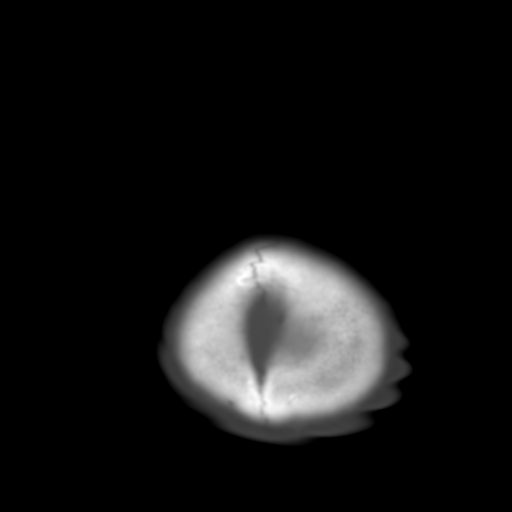

[Series 6: coronal soft tissue · coronal · 0.28mm/px · 3 of 60 slices shown]
[im 20/60  brain]
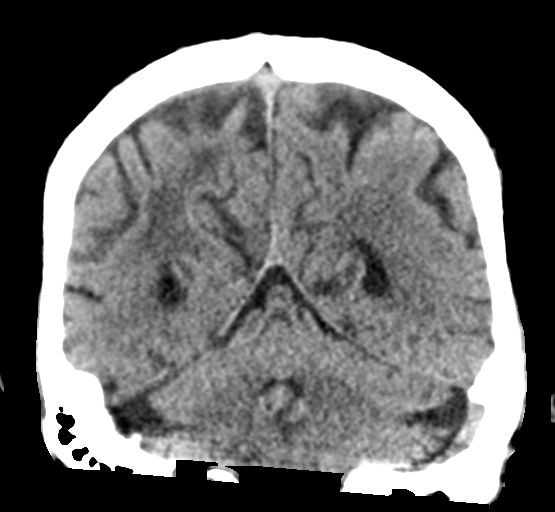
[im 27/60  brain]
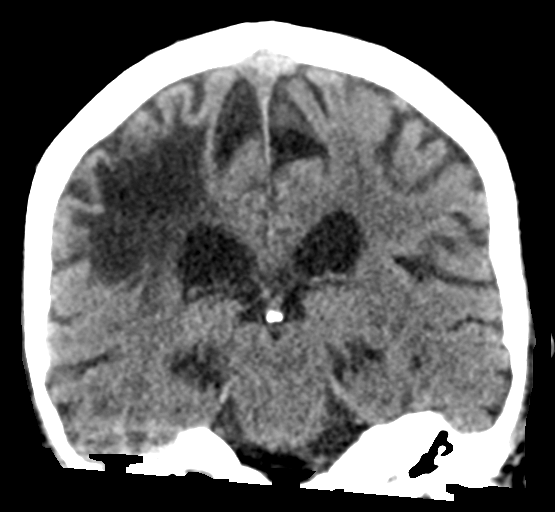
[im 33/60  brain]
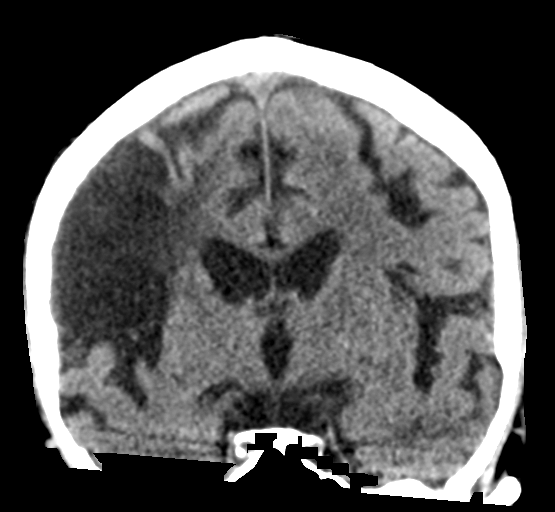

[Series 7: sagittal soft tissue · sagittal · 0.28mm/px · 3 of 52 slices shown]
[im 18/52  brain]
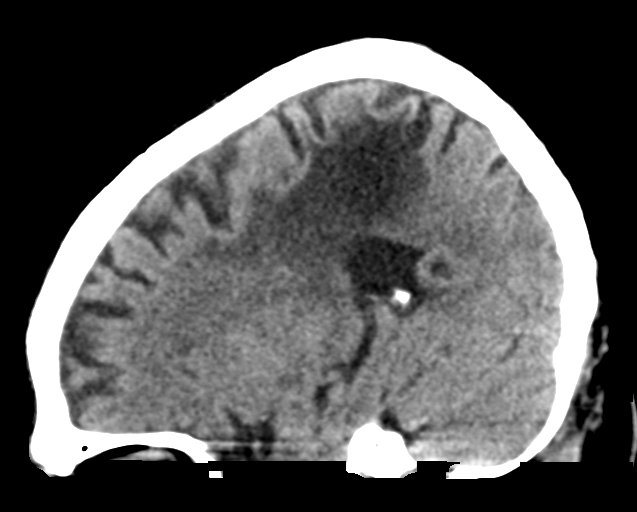
[im 26/52  brain]
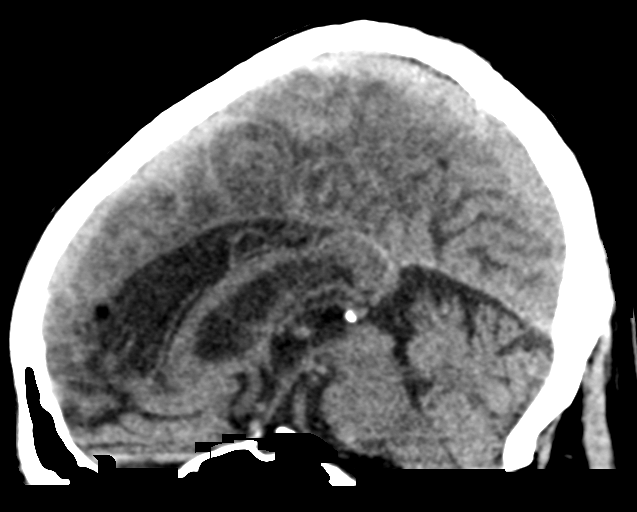
[im 35/52  brain]
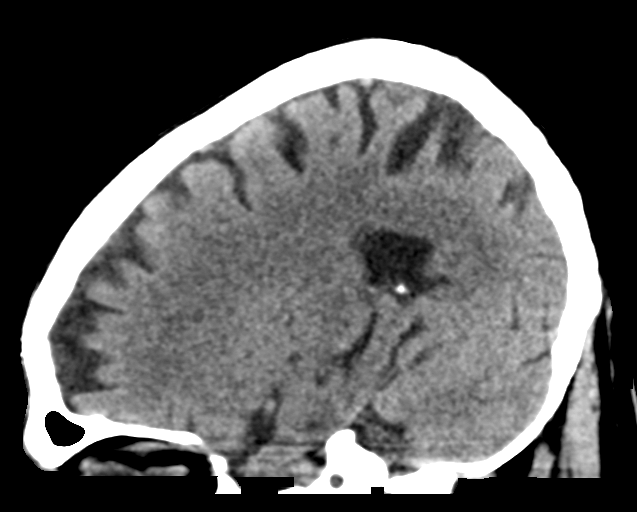

[15 of 47 positions shown; findings below may reference images not displayed]

FINDINGS: Brain: Generalized atrophy. Normal ventricular morphology. No
midline shift or mass effect. Minimal small vessel chronic ischemic
changes of deep cerebral white matter. Old RIGHT frontoparietal MCA
territory infarct. No intracranial hemorrhage, mass lesion, or
evidence acute infarction. No extra-axial fluid collections.

Vascular: Atherosclerotic calcification of internal carotid arteries
bilaterally at skullbase

Skull: Intact

Sinuses/Orbits: Clear

Other: N/A
IMPRESSION: Atrophy with minimal small vessel chronic ischemic changes of deep
cerebral white matter.

Old RIGHT MCA territory infarct.

No acute intracranial abnormalities.

## 2020-02-06 DIAGNOSIS — E782 Mixed hyperlipidemia: Secondary | ICD-10-CM | POA: Insufficient documentation

## 2020-12-16 ENCOUNTER — Ambulatory Visit (INDEPENDENT_AMBULATORY_CARE_PROVIDER_SITE_OTHER): Payer: Medicare Other | Admitting: Gastroenterology

## 2020-12-16 ENCOUNTER — Other Ambulatory Visit: Payer: Self-pay

## 2020-12-16 ENCOUNTER — Encounter: Payer: Self-pay | Admitting: Gastroenterology

## 2020-12-16 ENCOUNTER — Encounter (INDEPENDENT_AMBULATORY_CARE_PROVIDER_SITE_OTHER): Payer: Self-pay

## 2020-12-16 VITALS — BP 113/70 | HR 69 | Ht 66.0 in | Wt 130.0 lb

## 2020-12-16 DIAGNOSIS — D649 Anemia, unspecified: Secondary | ICD-10-CM | POA: Diagnosis not present

## 2020-12-16 DIAGNOSIS — K921 Melena: Secondary | ICD-10-CM

## 2020-12-16 MED ORDER — PEG 3350-KCL-NA BICARB-NACL 420 G PO SOLR: 4000.0000 mL | Freq: Once | ORAL | 0 refills | Status: AC

## 2020-12-16 NOTE — Progress Notes (Signed)
[name] MD, MRCP(U.K) [address]  [place]  [place], [place] [address]  Main: [phone]  Fax: [phone]   Gastroenterology Consultation  Referring Provider:     Jodi Marble, MD Primary Care Physician:  Jodi Marble, MD Primary Gastroenterologist:  Dr. [name]  Reason for Consultation: Blood in stool        HPI:   Walter Blair is a 75 y.o. y/o male referred for consultation & management  by Dr. Jodi Marble, MD.     He has been referred for blood in the stools.Is on Eliquis.  He is accompanied by his brother.  He states that he has noticed some blood in his stool only on the stool test but not visually.  Has noticed some recent onset diarrhea alternating with constipation.  Some weight loss over the past 2 years to 8 pounds.  Attributed to the heart attack that he had previously.  Last colonoscopy he recollects was over 8-9 years back and recollects was normal.  He says he was told that he is anemic but I am unable to find any recent labs on his chart on epic.  Past Medical History:  Diagnosis Date  . CAD (coronary artery disease)   . Diabetes ([place])   . H/O: stroke   . HTN (hypertension)     Past Surgical History:  Procedure Laterality Date  . CARDIAC DEFIBRILLATOR PLACEMENT    . CARDIOVERSION N/A 07/01/2017   Procedure: CARDIOVERSION;  Surgeon: Dionisio David, MD;  Location: [facility];  Service: Cardiovascular;  Laterality: N/A;  . CARDIOVERSION N/A 07/05/2017   Procedure: CARDIOVERSION;  Surgeon: Dionisio David, MD;  Location: [facility];  Service: Cardiovascular;  Laterality: N/A;  . CORONARY ARTERY BYPASS GRAFT    . TEE WITHOUT CARDIOVERSION N/A 07/01/2017   Procedure: TRANSESOPHAGEAL ECHOCARDIOGRAM (TEE);  Surgeon: Dionisio David, MD;  Location: [facility];  Service: Cardiovascular;  Laterality: N/A;  . TEE WITHOUT CARDIOVERSION N/A 07/05/2017   Procedure: TRANSESOPHAGEAL ECHOCARDIOGRAM (TEE);  Surgeon: Dionisio David, MD;   Location: [facility];  Service: Cardiovascular;  Laterality: N/A;    Prior to Admission medications   Medication Sig Start Date End Date Taking? Authorizing Provider  amiodarone (PACERONE) 200 MG tablet One tablet twice a day for one week then one tablet daily 07/06/17   Loletha Grayer, MD  apixaban (ELIQUIS) 5 MG TABS tablet Take 1 tablet (5 mg total) by mouth 2 (two) times daily. 07/06/17   Loletha Grayer, MD  digoxin (LANOXIN) 0.125 MG tablet Take 1 tablet (0.125 mg total) by mouth daily. 07/07/17   Loletha Grayer, MD  folic acid (FOLVITE) 1 MG tablet Take 1 mg by mouth every morning.    [provider]  furosemide (LASIX) 20 MG tablet Take 20 mg by mouth daily.    [provider]  metFORMIN (GLUCOPHAGE-XR) 500 MG 24 hr tablet Take 500-1,000 mg by mouth 2 (two) times daily. 500mg  in the morning and 1000mg  at night    [provider]  metoprolol succinate (TOPROL-XL) 50 MG 24 hr tablet Take 1 tablet (50 mg total) by mouth at bedtime. Take with or immediately following a meal. 07/06/17   Loletha Grayer, MD  polyethylene glycol (MIRALAX / GLYCOLAX) packet Take 17 g by mouth daily as needed. 07/06/17   Loletha Grayer, MD  RA ASPIRIN EC ADULT LOW ST 81 MG EC tablet Take 81 mg by mouth daily.    [provider]  sacubitril-valsartan Delene Loll) 24-26  MG Take 1 tablet by mouth 2 (two) times daily. 07/06/17   Loletha Grayer, MD  spironolactone (ALDACTONE) 25 MG tablet Take 0.5 tablets (12.5 mg total) by mouth daily. 07/06/17   Loletha Grayer, MD    Family History  Problem Relation Age of Onset  . Diabetes Brother   . Hypertension Mother   . Diabetes Sister      Social History   Tobacco Use  . Smoking status: Never Smoker  . Smokeless tobacco: Never Used  Substance Use Topics  . Alcohol use: No  . Drug use: No    Allergies as of 12/16/2020  . (No Known Allergies)    Review of Systems:    All systems reviewed and negative except where  noted in HPI.   Physical Exam:  There were no vitals taken for this visit. No LMP for male patient. Psych:  Alert and cooperative. Normal mood and affect. General:   Alert,  Well-developed, well-nourished, pleasant and cooperative in NAD Head:  Normocephalic and atraumatic. Eyes:  Sclera clear, no icterus.   Conjunctiva pink. Ears:  Normal auditory acuity. Lungs:  Respirations even and unlabored.  Clear throughout to auscultation.   No wheezes, crackles, or rhonchi. No acute distress. Heart:  Regular rate and rhythm; no murmurs, clicks, rubs, or gallops. Abdomen:  Normal bowel sounds.  No bruits.  Soft, non-tender and non-distended without masses, hepatosplenomegaly or hernias noted.  No guarding or rebound tenderness.    Neurologic:  Alert and oriented x3;  grossly normal neurologically. Psych:  Alert and cooperative. Normal mood and affect.  Imaging Studies: No results found.  Assessment and Plan:   Walter Blair is a 75 y.o. y/o male has been referred for blood in the stool.  He is on Eliquis  Plan 1.  Diagnostic colonoscopy.  Will need Eliquis holding instructions 2.  I will order labs today and if there is any evidence of iron deficiency anemia he would need an upper endoscopy scheduled at the same time.  He is tentatively booked for April 12  I have discussed alternative options, risks & benefits,  which include, but are not limited to, bleeding, infection, perforation,respiratory complication & drug reaction.  The patient agrees with this plan & written consent will be obtained.     Follow up in as needed  Dr [name] MD,MRCP(U.K)

## 2020-12-17 ENCOUNTER — Telehealth: Payer: Self-pay

## 2020-12-17 LAB — CBC WITH DIFFERENTIAL/PLATELET
Basophils Absolute: 0 10*3/uL (ref 0.0–0.2)
Basos: 0 %
EOS (ABSOLUTE): 0.1 10*3/uL (ref 0.0–0.4)
Eos: 2 %
Hematocrit: 29.2 % — ABNORMAL LOW (ref 37.5–51.0)
Hemoglobin: 9.6 g/dL — ABNORMAL LOW (ref 13.0–17.7)
Immature Grans (Abs): 0 10*3/uL (ref 0.0–0.1)
Immature Granulocytes: 0 %
Lymphocytes Absolute: 1.4 10*3/uL (ref 0.7–3.1)
Lymphs: 29 %
MCH: 29.5 pg (ref 26.6–33.0)
MCHC: 32.9 g/dL (ref 31.5–35.7)
MCV: 90 fL (ref 79–97)
Monocytes Absolute: 0.4 10*3/uL (ref 0.1–0.9)
Monocytes: 8 %
Neutrophils Absolute: 2.8 10*3/uL (ref 1.4–7.0)
Neutrophils: 61 %
Platelets: 173 10*3/uL (ref 150–450)
RBC: 3.25 x10E6/uL — ABNORMAL LOW (ref 4.14–5.80)
RDW: 15.1 % (ref 11.6–15.4)
WBC: 4.6 10*3/uL (ref 3.4–10.8)

## 2020-12-17 LAB — IRON,TIBC AND FERRITIN PANEL
Ferritin: 29 ng/mL — ABNORMAL LOW (ref 30–400)
Iron Saturation: 24 % (ref 15–55)
Iron: 77 ug/dL (ref 38–169)
Total Iron Binding Capacity: 324 ug/dL (ref 250–450)
UIBC: 247 ug/dL (ref 111–343)

## 2020-12-17 NOTE — Telephone Encounter (Signed)
-----   Message from [name], MD sent at 12/17/2020  8:40 AM EDT ----- Please inform patient that his hemoglobin is low at 9.6 g and he has a low iron.  Commence on ferrous sulfate 325 mg 1 tablet twice a day every other day for 90 days with 1 refill.

## 2020-12-17 NOTE — Telephone Encounter (Signed)
Called to inform patient of the lab results and Dr. Georgeann Oppenheim recommendation/comments. LVM for the nephew to call our office back.

## 2020-12-18 ENCOUNTER — Telehealth: Payer: Self-pay

## 2020-12-18 MED ORDER — FERROUS SULFATE 325 (65 FE) MG PO TABS: ORAL_TABLET | ORAL | 1 refills | Status: DC

## 2020-12-18 NOTE — Telephone Encounter (Signed)
Spoke with patient's son regarding the results and Dr. Georgeann Oppenheim recommendation/comments. Informed him I will sent in a Rx for Iron. Informed Mr. Angel's son he will need to stop his Eliquis 3 days prior to procedure and restart 1 day after procedure. Pt's son verbalized understanding.

## 2020-12-18 NOTE — Telephone Encounter (Signed)
-----   Message from [name], MD sent at 12/17/2020  8:40 AM EDT ----- Please inform patient that his hemoglobin is low at 9.6 g and he has a low iron.  Commence on ferrous sulfate 325 mg 1 tablet twice a day every other day for 90 days with 1 refill.

## 2020-12-26 ENCOUNTER — Other Ambulatory Visit: Admission: RE | Admit: 2020-12-26 | Payer: Medicare Other | Source: Ambulatory Visit

## 2020-12-30 ENCOUNTER — Ambulatory Visit: Payer: Medicare Other | Admitting: Registered Nurse

## 2020-12-30 ENCOUNTER — Encounter
Admission: RE | Disposition: A | Discharge status: Home / Self Care | Payer: Self-pay | Source: Home / Self Care | Attending: Gastroenterology

## 2020-12-30 ENCOUNTER — Encounter: Payer: Self-pay | Admitting: Gastroenterology

## 2020-12-30 ENCOUNTER — Other Ambulatory Visit: Payer: Self-pay

## 2020-12-30 ENCOUNTER — Ambulatory Visit
Admission: RE | Admit: 2020-12-30 | Discharge: 2020-12-30 | Disposition: A | Discharge status: Home / Self Care | Payer: Medicare Other | Attending: Gastroenterology | Admitting: Gastroenterology

## 2020-12-30 DIAGNOSIS — Z79899 Other long term (current) drug therapy: Secondary | ICD-10-CM | POA: Diagnosis not present

## 2020-12-30 DIAGNOSIS — K621 Rectal polyp: Secondary | ICD-10-CM | POA: Diagnosis not present

## 2020-12-30 DIAGNOSIS — K921 Melena: Secondary | ICD-10-CM

## 2020-12-30 DIAGNOSIS — Z7901 Long term (current) use of anticoagulants: Secondary | ICD-10-CM | POA: Insufficient documentation

## 2020-12-30 DIAGNOSIS — Z95810 Presence of automatic (implantable) cardiac defibrillator: Secondary | ICD-10-CM | POA: Diagnosis not present

## 2020-12-30 DIAGNOSIS — Z951 Presence of aortocoronary bypass graft: Secondary | ICD-10-CM | POA: Diagnosis not present

## 2020-12-30 DIAGNOSIS — K635 Polyp of colon: Secondary | ICD-10-CM

## 2020-12-30 DIAGNOSIS — Z7982 Long term (current) use of aspirin: Secondary | ICD-10-CM | POA: Insufficient documentation

## 2020-12-30 DIAGNOSIS — K640 First degree hemorrhoids: Secondary | ICD-10-CM | POA: Diagnosis not present

## 2020-12-30 DIAGNOSIS — D509 Iron deficiency anemia, unspecified: Secondary | ICD-10-CM | POA: Diagnosis present

## 2020-12-30 DIAGNOSIS — Z7984 Long term (current) use of oral hypoglycemic drugs: Secondary | ICD-10-CM | POA: Insufficient documentation

## 2020-12-30 HISTORY — PX: COLONOSCOPY WITH PROPOFOL: SHX5780

## 2020-12-30 HISTORY — PX: ESOPHAGOGASTRODUODENOSCOPY: SHX5428

## 2020-12-30 LAB — GLUCOSE, CAPILLARY: Glucose-Capillary: 74 mg/dL (ref 70–99)

## 2020-12-30 SURGERY — COLONOSCOPY WITH PROPOFOL
Anesthesia: General

## 2020-12-30 MED ORDER — LIDOCAINE HCL (CARDIAC) PF 100 MG/5ML IV SOSY
PREFILLED_SYRINGE | INTRAVENOUS | Status: DC | PRN
  Administered 2020-12-30: 80 mg via INTRAVENOUS

## 2020-12-30 MED ORDER — PROPOFOL 10 MG/ML IV BOLUS
INTRAVENOUS | Status: DC | PRN
  Administered 2020-12-30: 70 mg via INTRAVENOUS

## 2020-12-30 MED ORDER — SODIUM CHLORIDE 0.9 % IV SOLN
INTRAVENOUS | Status: DC
  Administered 2020-12-30: 20 mL/h via INTRAVENOUS

## 2020-12-30 MED ORDER — PROPOFOL 500 MG/50ML IV EMUL
INTRAVENOUS | Status: DC | PRN
  Administered 2020-12-30: 140 ug/kg/min via INTRAVENOUS

## 2020-12-30 NOTE — H&P (Signed)
[name], MD [address], [place], [place], [place], [address] [address], [place], [place], [place], [address] Phone: [phone]  Fax: [phone]  Primary Care Physician:  Jodi Marble, MD   Pre-Procedure History & Physical: HPI:  Walter Blair is a 75 y.o. male is here for an endoscopy and colonoscopy    Past Medical History:  Diagnosis Date  . CAD (coronary artery disease)   . Diabetes ([place])   . H/O: stroke   . HTN (hypertension)     Past Surgical History:  Procedure Laterality Date  . CARDIAC DEFIBRILLATOR PLACEMENT    . CARDIOVERSION N/A 07/01/2017   Procedure: CARDIOVERSION;  Surgeon: Dionisio David, MD;  Location: [facility];  Service: Cardiovascular;  Laterality: N/A;  . CARDIOVERSION N/A 07/05/2017   Procedure: CARDIOVERSION;  Surgeon: Dionisio David, MD;  Location: [facility];  Service: Cardiovascular;  Laterality: N/A;  . CORONARY ARTERY BYPASS GRAFT    . TEE WITHOUT CARDIOVERSION N/A 07/01/2017   Procedure: TRANSESOPHAGEAL ECHOCARDIOGRAM (TEE);  Surgeon: Dionisio David, MD;  Location: [facility];  Service: Cardiovascular;  Laterality: N/A;  . TEE WITHOUT CARDIOVERSION N/A 07/05/2017   Procedure: TRANSESOPHAGEAL ECHOCARDIOGRAM (TEE);  Surgeon: Dionisio David, MD;  Location: [facility];  Service: Cardiovascular;  Laterality: N/A;    Prior to Admission medications   Medication Sig Start Date End Date Taking? Authorizing Provider  amiodarone (PACERONE) 200 MG tablet One tablet twice a day for one week then one tablet daily 07/06/17  Yes Wieting, Richard, MD  apixaban (ELIQUIS) 5 MG TABS tablet Take 1 tablet (5 mg total) by mouth 2 (two) times daily. 07/06/17  Yes Wieting, Richard, MD  atorvastatin (LIPITOR) 80 MG tablet Take 80 mg by mouth daily. 10/03/20  Yes [provider]  digoxin (LANOXIN) 0.125 MG tablet Take 1 tablet (0.125 mg total) by mouth daily. 07/07/17  Yes Wieting, Richard, MD  ferrous sulfate (FERROUSUL) 325 (65 FE) MG  tablet Take 1 tablet twice a day every other day for 90 days. 12/18/20  Yes [name], MD  folic acid (FOLVITE) 1 MG tablet Take 1 mg by mouth every morning.   Yes [provider]  furosemide (LASIX) 20 MG tablet Take 20 mg by mouth daily.   Yes [provider]  glipiZIDE (GLUCOTROL XL) 2.5 MG 24 hr tablet Take 2.5 mg by mouth daily. 12/09/20  Yes [provider]  metFORMIN (GLUCOPHAGE-XR) 500 MG 24 hr tablet Take 500-1,000 mg by mouth 2 (two) times daily. 500mg  in the morning and 1000mg  at night   Yes [provider]  metoprolol succinate (TOPROL-XL) 50 MG 24 hr tablet Take 1 tablet (50 mg total) by mouth at bedtime. Take with or immediately following a meal. 07/06/17  Yes Wieting, Richard, MD  polyethylene glycol (MIRALAX / GLYCOLAX) packet Take 17 g by mouth daily as needed. 07/06/17  Yes Wieting, Richard, MD  RA ASPIRIN EC ADULT LOW ST 81 MG EC tablet Take 81 mg by mouth daily.   Yes [provider]  sacubitril-valsartan (ENTRESTO) 24-26 MG Take 1 tablet by mouth 2 (two) times daily. 07/06/17  Yes Wieting, Richard, MD  spironolactone (ALDACTONE) 25 MG tablet Take 0.5 tablets (12.5 mg total) by mouth daily. 07/06/17  Yes Loletha Grayer, MD    Allergies as of 12/16/2020  . (No Known Allergies)    Family History  Problem Relation Age of Onset  . Diabetes Brother   . Hypertension Mother   . Diabetes  Sister     Social History   Socioeconomic History  . Marital status: Widowed    Spouse name: Not on file  . Number of children: Not on file  . Years of education: Not on file  . Highest education level: Not on file  Occupational History  . Not on file  Tobacco Use  . Smoking status: Never Smoker  . Smokeless tobacco: Never Used  Substance and Sexual Activity  . Alcohol use: No  . Drug use: No  . Sexual activity: Not on file  Other Topics Concern  . Not on file  Social History Narrative  . Not on file   Social Determinants of  Health   Financial Resource Strain: Not on file  Food Insecurity: Not on file  Transportation Needs: Not on file  Physical Activity: Not on file  Stress: Not on file  Social Connections: Not on file  Intimate Partner Violence: Not on file    Review of Systems: See HPI, otherwise negative ROS  Physical Exam: BP 124/69   Pulse 64   Temp (!) 96 F (35.6 C) (Temporal)   Resp 20   Ht 5\' 5"  (1.651 m)   Wt 59 kg   BMI 21.63 kg/m  General:   Alert,  pleasant and cooperative in NAD Head:  Normocephalic and atraumatic. Neck:  Supple; no masses or thyromegaly. Lungs:  Clear throughout to auscultation, normal respiratory effort.    Heart:  +S1, +S2, Regular rate and rhythm, No edema. Abdomen:  Soft, nontender and nondistended. Normal bowel sounds, without guarding, and without rebound.   Neurologic:  Alert and  oriented x4;  grossly normal neurologically.  Impression/Plan: Walter Blair is here for an endoscopy and colonoscopy  to be performed for  evaluation of iron deficiency anemia     Risks, benefits, limitations, and alternatives regarding endoscopy have been reviewed with the patient.  Questions have been answered.  All parties agreeable.   [name], MD  12/30/2020, 9:09 AM

## 2020-12-30 NOTE — Anesthesia Preprocedure Evaluation (Signed)
Anesthesia Evaluation  Patient identified by MRN, date of birth, ID band Patient confused    Reviewed: Allergy & Precautions, H&P , NPO status , Patient's Chart, lab work & pertinent test results  History of Anesthesia Complications Negative for: history of anesthetic complications  Airway Mallampati: III  TM Distance: >3 FB Neck ROM: limited    Dental  (+) Poor Dentition, Chipped, Missing   Pulmonary neg pulmonary ROS, neg shortness of breath,           Cardiovascular Exercise Tolerance: Poor hypertension, (-) angina+ CAD, + Past MI, + Cardiac Stents and + CABG  (-) DOE + dysrhythmias Atrial Fibrillation + Cardiac Defibrillator (-) Valvular Problems/Murmurs     Neuro/Psych negative neurological ROS  negative psych ROS   GI/Hepatic negative GI ROS, Neg liver ROS, neg GERD  ,  Endo/Other  diabetes, Type 2  Renal/GU negative Renal ROS  negative genitourinary   Musculoskeletal   Abdominal   Peds  Hematology negative hematology ROS (+)   Anesthesia Other Findings Past Medical History: No date: CAD (coronary artery disease) No date: Diabetes ([place]) No date: H/O: stroke No date: HTN (hypertension)  Past Surgical History: No date: CARDIAC DEFIBRILLATOR PLACEMENT 07/01/2017: CARDIOVERSION; N/A     Comment:  Procedure: CARDIOVERSION;  Surgeon: Dionisio David, MD;              Location: [facility] ORS;  Service: Cardiovascular;                Laterality: N/A; No date: CORONARY ARTERY BYPASS GRAFT 07/01/2017: TEE WITHOUT CARDIOVERSION; N/A     Comment:  Procedure: TRANSESOPHAGEAL ECHOCARDIOGRAM (TEE);                Surgeon: Dionisio David, MD;  Location: [facility] ORS;                Service: Cardiovascular;  Laterality: N/A;  BMI    Body Mass Index:  20.15 kg/m      Reproductive/Obstetrics negative OB ROS                             Anesthesia Physical  Anesthesia Plan  ASA:  IV  Anesthesia Plan: General   Post-op Pain Management:    Induction: Intravenous  PONV Risk Score and Plan: 2 and TIVA and Propofol infusion  Airway Management Planned: Natural Airway and Nasal Cannula  Additional Equipment:   Intra-op Plan:   Post-operative Plan:   Informed Consent: I have reviewed the patients History and Physical, chart, labs and discussed the procedure including the risks, benefits and alternatives for the proposed anesthesia with the patient or authorized representative who has indicated his/her understanding and acceptance.     Dental Advisory Given  Plan Discussed with: Anesthesiologist, CRNA and Surgeon  Anesthesia Plan Comments: (Family consented  Patient and family informed that patient is higher risk for complications from anesthesia during this procedure due to their medical history and age including but not limited to post operative cognitive dysfunction.  They voiced understanding.  Family consented for risks of anesthesia including but not limited to:  - adverse reactions to medications - risk of intubation if required - damage to teeth, lips or other oral mucosa - sore throat or hoarseness - Damage to heart, brain, lungs or loss of life  They voiced understanding.)        Anesthesia Quick Evaluation

## 2020-12-30 NOTE — Op Note (Signed)
[facility] Gastroenterology Patient Name: Walter Blair Procedure Date: 12/30/2020 9:12 AM MRN: [id number] Account #: [id number] Date of Birth: 1946/08/02 Admit Type: Outpatient Age: 75 Room: [facility] ENDO ROOM 2 Gender: Male Note Status: Finalized Procedure:             Upper GI endoscopy Indications:           Iron deficiency anemia Providers:             [name] MD, MD Referring MD:          Venetia Maxon. Elijio Miles, MD (Referring MD) Medicines:             Monitored Anesthesia Care Complications:         No immediate complications. Procedure:             Pre-Anesthesia Assessment:                        - Prior to the procedure, a History and Physical was                         performed, and patient medications, allergies and                         sensitivities were reviewed. The patient's tolerance                         of previous anesthesia was reviewed.                        - The risks and benefits of the procedure and the                         sedation options and risks were discussed with the                         patient. All questions were answered and informed                         consent was obtained.                        - ASA Grade Assessment: II - A patient with mild                         systemic disease.                        After obtaining informed consent, the endoscope was                         passed under direct vision. Throughout the procedure,                         the patient's blood pressure, pulse, and oxygen                         saturations were monitored continuously. The Endoscope                         was introduced through the mouth,  and advanced to the                         third part of duodenum. The upper GI endoscopy was                         accomplished with ease. The patient tolerated the                         procedure well. Findings:      The esophagus was normal.      The stomach was  normal.      The cardia and gastric fundus were normal on retroflexion.      The examined duodenum was normal. Biopsies for histology were taken with       a cold forceps for evaluation of celiac disease. Impression:            - Normal esophagus.                        - Normal stomach.                        - Normal examined duodenum. Biopsied. Recommendation:        - Await pathology results.                        - Perform a colonoscopy today. Procedure Code(s):     --- Professional ---                        [phone], Esophagogastroduodenoscopy, flexible,                         transoral; with biopsy, single or multiple Diagnosis Code(s):     --- Professional ---                        D50.9, Iron deficiency anemia, unspecified CPT copyright 2019 American Medical Association. All rights reserved. The codes documented in this report are preliminary and upon coder review may  be revised to meet current compliance requirements. [name], MD [name] MD, MD 12/30/2020 9:29:47 AM This report has been signed electronically. Number of Addenda: 0 Note Initiated On: 12/30/2020 9:12 AM Estimated Blood Loss:  Estimated blood loss: none.      [facility]

## 2020-12-30 NOTE — Op Note (Signed)
[facility] Gastroenterology Patient Name: Walter Blair Procedure Date: 12/30/2020 9:10 AM MRN: [id number] Account #: [id number] Date of Birth: [birth year] Admit Type: Outpatient Age: 75 Room: [facility] ENDO ROOM 2 Gender: Male Note Status: Finalized Procedure:             Colonoscopy Indications:           Iron deficiency anemia Providers:             [name] MD, MD Referring MD:          Venetia Maxon. Elijio Miles, MD (Referring MD) Medicines:             Monitored Anesthesia Care Complications:         No immediate complications. Procedure:             Pre-Anesthesia Assessment:                        - Prior to the procedure, a History and Physical was                         performed, and patient medications, allergies and                         sensitivities were reviewed. The patient's tolerance                         of previous anesthesia was reviewed.                        - The risks and benefits of the procedure and the                         sedation options and risks were discussed with the                         patient. All questions were answered and informed                         consent was obtained.                        - ASA Grade Assessment: II - A patient with mild                         systemic disease.                        After obtaining informed consent, the colonoscope was                         passed under direct vision. Throughout the procedure,                         the patient's blood pressure, pulse, and oxygen                         saturations were monitored continuously. The                         Colonoscope was introduced through the anus and  advanced to the the cecum, identified by the                         appendiceal orifice. The colonoscopy was performed                         with ease. The patient tolerated the procedure well.                         The quality of the bowel  preparation was excellent. Findings:      The perianal and digital rectal examinations were normal.      A 3 mm polyp was found in the rectum. The polyp was sessile. The polyp       was removed with a cold biopsy forceps. Resection and retrieval were       complete.      Non-bleeding internal hemorrhoids were found during retroflexion. The       hemorrhoids were small and Grade I (internal hemorrhoids that do not       prolapse).      The exam was otherwise without abnormality on direct and retroflexion       views. Impression:            - One 3 mm polyp in the rectum, removed with a cold                         biopsy forceps. Resected and retrieved.                        - Non-bleeding internal hemorrhoids.                        - The examination was otherwise normal on direct and                         retroflexion views. Recommendation:        - Discharge patient to home (with escort).                        - Resume previous diet.                        - Continue present medications.                        - To visualize the small bowel, perform video capsule                         endoscopy in 2 weeks.                        - Return to my office as previously scheduled. Procedure Code(s):     --- Professional ---                        [phone], Colonoscopy, flexible; with biopsy, single or                         multiple Diagnosis Code(s):     --- Professional ---  K62.1, Rectal polyp                        K64.0, First degree hemorrhoids                        D50.9, Iron deficiency anemia, unspecified CPT copyright 2019 American Medical Association. All rights reserved. The codes documented in this report are preliminary and upon coder review may  be revised to meet current compliance requirements. [name], MD [name] MD, MD 12/30/2020 9:48:27 AM This report has been signed electronically. Number of Addenda: 0 Note Initiated On: 12/30/2020  9:10 AM Scope Withdrawal Time: 0 hours 11 minutes 13 seconds  Total Procedure Duration: 0 hours 14 minutes 30 seconds  Estimated Blood Loss:  Estimated blood loss: none.      [facility]

## 2020-12-30 NOTE — Transfer of Care (Signed)
Immediate Anesthesia Transfer of Care Note  Patient: Child Kegel  Procedure(s) Performed: COLONOSCOPY WITH PROPOFOL (N/A ) ESOPHAGOGASTRODUODENOSCOPY (EGD)  Patient Location: PACU  Anesthesia Type:General  Level of Consciousness: sedated  Airway & Oxygen Therapy: Patient Spontanous Breathing  Post-op Assessment: Report given to RN and Post -op Vital signs reviewed and stable  Post vital signs: Reviewed and stable  Last Vitals:  Vitals Value Taken Time  BP    Temp    Pulse    Resp    SpO2      Last Pain:  Vitals:   12/30/20 0851  TempSrc: Temporal  PainSc: 0-No pain         Complications: No complications documented.

## 2020-12-30 NOTE — Anesthesia Postprocedure Evaluation (Signed)
Anesthesia Post Note  Patient: Walter Blair  Procedure(s) Performed: COLONOSCOPY WITH PROPOFOL (N/A ) ESOPHAGOGASTRODUODENOSCOPY (EGD)  Patient location during evaluation: Endoscopy Anesthesia Type: General Level of consciousness: awake and alert Pain management: pain level controlled Vital Signs Assessment: post-procedure vital signs reviewed and stable Respiratory status: spontaneous breathing, nonlabored ventilation, respiratory function stable and patient connected to nasal cannula oxygen Cardiovascular status: blood pressure returned to baseline and stable Postop Assessment: no apparent nausea or vomiting Anesthetic complications: no   No complications documented.   Last Vitals:  Vitals:   12/30/20 1010 12/30/20 1020  BP: 120/63 106/70  Pulse: 60 60  Resp: 14 13  Temp:    SpO2: 99% 100%    Last Pain:  Vitals:   12/30/20 1020  TempSrc:   PainSc: 0-No pain                 Martha Clan

## 2020-12-31 ENCOUNTER — Encounter: Payer: Self-pay | Admitting: Gastroenterology

## 2020-12-31 LAB — SURGICAL PATHOLOGY

## 2021-01-16 ENCOUNTER — Telehealth: Payer: Self-pay | Admitting: Gastroenterology

## 2021-01-16 NOTE — Telephone Encounter (Signed)
Patient's brother calling to make appointment for Capsule Study per Dr Vicente Males.  Please call to schedule

## 2021-01-16 NOTE — Telephone Encounter (Signed)
We can do a video visit for me to explain the procedure if he wishes before the study or if ok to proceed directly can help schedule capsule study for anemia

## 2021-01-20 ENCOUNTER — Telehealth (INDEPENDENT_AMBULATORY_CARE_PROVIDER_SITE_OTHER): Payer: Medicare Other | Admitting: Gastroenterology

## 2021-01-20 DIAGNOSIS — D509 Iron deficiency anemia, unspecified: Secondary | ICD-10-CM | POA: Diagnosis not present

## 2021-01-20 DIAGNOSIS — K921 Melena: Secondary | ICD-10-CM | POA: Diagnosis not present

## 2021-01-20 NOTE — Progress Notes (Signed)
[name] , MD [address]  [place]  [place], [place] [address]  Main: [phone]  Fax: [phone]   Primary Care Physician: Jodi Marble, MD  Virtual Visit via Video Note  I connected with patient on 01/20/21 at  3:15 PM EDT by video and verified that I am speaking with the correct person using two identifiers.   I discussed the limitations, risks, security and privacy concerns of performing an evaluation and management service by video  and the availability of in person appointments. I also discussed with the patient that there may be a patient responsible charge related to this service. The patient expressed understanding and agreed to proceed.  Location of Patient: Home Location of Provider: Home Persons involved: Patient and provider only   History of Present Illness:   Follow-up after procedures for blood in stool  HPI: Walter Blair is a 75 y.o. male  Summary of history :  Initially referred and seen on 12/16/2020 for blood in stool.  He had been on Eliquis.  Weight loss of 8 pounds over 2 years.  Has had a heart attack which he attributed to weight loss 2.  Interval history   12/16/2020-01/21/2020  12/16/2020: Hemoglobin 9.6 g with an MCV of 90, ferritin 29  12/30/2020: EGD: Upper endoscopy, colonoscopy performed on the same day.  Showed 3 mm polyp in the rectum otherwise nonbleeding internal hemorrhoids.Biopsies of the duodenum were normal and rectal polyp was hyperplastic.  Creatinine 1.4   Doing well no further rectal bleeding.  Has been taking oral iron.  Some constipation with that. Discussed about need for high-fiber diet to prevent bleeding from hemorrhoids  Current Outpatient Medications  Medication Sig Dispense Refill  . amiodarone (PACERONE) 200 MG tablet One tablet twice a day for one week then one tablet daily 44 tablet 0  . apixaban (ELIQUIS) 5 MG TABS tablet Take 1 tablet (5 mg total) by mouth 2 (two) times daily. 60 tablet 0  .  atorvastatin (LIPITOR) 80 MG tablet Take 80 mg by mouth daily.    . digoxin (LANOXIN) 0.125 MG tablet Take 1 tablet (0.125 mg total) by mouth daily. 30 tablet 0  . ferrous sulfate (FERROUSUL) 325 (65 FE) MG tablet Take 1 tablet twice a day every other day for 90 days. 595 tablet 1  . folic acid (FOLVITE) 1 MG tablet Take 1 mg by mouth every morning.    . furosemide (LASIX) 20 MG tablet Take 20 mg by mouth daily.    Marland Kitchen glipiZIDE (GLUCOTROL XL) 2.5 MG 24 hr tablet Take 2.5 mg by mouth daily.    . metFORMIN (GLUCOPHAGE-XR) 500 MG 24 hr tablet Take 500-1,000 mg by mouth 2 (two) times daily. 500mg  in the morning and 1000mg  at night    . metoprolol succinate (TOPROL-XL) 50 MG 24 hr tablet Take 1 tablet (50 mg total) by mouth at bedtime. Take with or immediately following a meal. 30 tablet 0  . polyethylene glycol (MIRALAX / GLYCOLAX) packet Take 17 g by mouth daily as needed. 30 each 0  . RA ASPIRIN EC ADULT LOW ST 81 MG EC tablet Take 81 mg by mouth daily.    . sacubitril-valsartan (ENTRESTO) 24-26 MG Take 1 tablet by mouth 2 (two) times daily. 60 tablet 0  . spironolactone (ALDACTONE) 25 MG tablet Take 0.5 tablets (12.5 mg total) by mouth daily. 30 tablet 0   No current facility-administered medications for this visit.    Allergies as of 01/20/2021  . (  No Known Allergies)    Review of Systems:    All systems reviewed and negative except where noted in HPI.  General Appearance:    Alert, cooperative, no distress, appears stated age  Head:    Normocephalic, without obvious abnormality, atraumatic  Eyes:    PERRL, conjunctiva/corneas clear,  Ears:    Grossly normal hearing    Neurologic:  Grossly normal    Observations/Objective:  Labs: CMP     Component Value Date/Time   NA 138 07/04/2017 0550   K 3.5 07/06/2017 0509   CL 102 07/04/2017 0550   CO2 27 07/04/2017 0550   GLUCOSE 185 (H) 07/04/2017 0550   BUN 18 07/04/2017 0550   CREATININE 0.92 07/04/2017 0550   CALCIUM 8.8 (L)  07/04/2017 0550   PROT 6.7 07/06/2017 0509   ALBUMIN 3.2 (L) 07/06/2017 0509   AST 68 (H) 07/06/2017 0509   ALT 268 (H) 07/06/2017 0509   ALKPHOS 62 07/06/2017 0509   BILITOT 1.9 (H) 07/06/2017 0509   GFRNONAA >60 07/04/2017 0550   GFRAA >60 07/04/2017 0550   Lab Results  Component Value Date   WBC 4.6 12/16/2020   HGB 9.6 (L) 12/16/2020   HCT 29.2 (L) 12/16/2020   MCV 90 12/16/2020   PLT 173 12/16/2020    Imaging Studies: No results found.  Assessment and Plan:   Walter Blair is a 75 y.o. y/o male is here today for a follow-up visit after his EGD and colonoscopy which were performed for blood in the stool and iron deficiency anemia.  Procedures were normal except for internal hemorrhoids.  Commenced on oral iron.  We will proceed with capsule study of the small bowel to complete evaluation for iron deficiency anemia.  Suggest to check labs in 6 weeks time if iron studies and hemoglobin have not improved may need referral to hematology to determine if there other reasons for anemia in addition to iron deficiency such as  MDS: Anemia of chronic disease.  Suggested 1 capful of MiraLAX daily for constipation probably secondary to iron pills.  Risks, benefits, alternatives of Givens capsule discussed with patient to include but not limited to the rare risk of Given's capsule becoming lodged in the GI tract requiring surgical removal.  The patient agrees with this plan & consent will be obtained.    I discussed the assessment and treatment plan with the patient. The patient was provided an opportunity to ask questions and all were answered. The patient agreed with the plan and demonstrated an understanding of the instructions.   The patient was advised to call back or seek an in-person evaluation if the symptoms worsen or if the condition fails to improve as anticipated.  I provided 18 minutes of face-to-face time during this encounter.  Dr [name] MD,MRCP  [facility]) Gastroenterology/Hepatology Pager: [phone]   Speech recognition software was used to dictate this note.

## 2021-01-27 ENCOUNTER — Other Ambulatory Visit: Payer: Self-pay

## 2021-01-27 ENCOUNTER — Telehealth: Payer: Self-pay

## 2021-01-27 DIAGNOSIS — D509 Iron deficiency anemia, unspecified: Secondary | ICD-10-CM

## 2021-01-27 NOTE — Progress Notes (Signed)
Patient's son has been informed of capsule study. Instructions have been sent to my chart. Explained Endo unit will contact them with time.

## 2021-01-27 NOTE — Telephone Encounter (Signed)
Informed brother capsule study has been scheduled. Will have to contact Endo unit tomorrow to change the date. Updated instructions will be sent. Brother verbalized understanding.

## 2021-01-27 NOTE — Telephone Encounter (Signed)
Brother wants to know why the capsule study hasn't been scheduled? Says Dr. Vicente Males recommended the study and said someone would call him to schedule. Han't heard from anyone.Please call

## 2021-01-28 ENCOUNTER — Other Ambulatory Visit: Payer: Self-pay

## 2021-01-28 NOTE — Progress Notes (Signed)
Patient's brother has been informed of the date change and updated instructions has been sent via my chart.

## 2021-02-02 ENCOUNTER — Ambulatory Visit
Admission: RE | Admit: 2021-02-02 | Discharge: 2021-02-02 | Disposition: A | Discharge status: Home / Self Care | Payer: Medicare Other | Source: Ambulatory Visit | Attending: Gastroenterology | Admitting: Gastroenterology

## 2021-02-02 ENCOUNTER — Encounter: Payer: Self-pay | Admitting: Gastroenterology

## 2021-02-02 ENCOUNTER — Encounter
Admission: RE | Disposition: A | Discharge status: Home / Self Care | Payer: Self-pay | Source: Ambulatory Visit | Attending: Gastroenterology

## 2021-02-02 DIAGNOSIS — D509 Iron deficiency anemia, unspecified: Secondary | ICD-10-CM | POA: Insufficient documentation

## 2021-02-02 HISTORY — PX: GIVENS CAPSULE STUDY: SHX5432

## 2021-02-02 SURGERY — IMAGING PROCEDURE, GI TRACT, INTRALUMINAL, VIA CAPSULE

## 2021-02-25 ENCOUNTER — Encounter: Payer: Self-pay | Admitting: Gastroenterology

## 2021-03-10 ENCOUNTER — Other Ambulatory Visit: Payer: Self-pay

## 2021-03-11 ENCOUNTER — Encounter: Payer: Self-pay | Admitting: Gastroenterology

## 2021-03-11 ENCOUNTER — Other Ambulatory Visit: Payer: Self-pay

## 2021-03-11 ENCOUNTER — Ambulatory Visit (INDEPENDENT_AMBULATORY_CARE_PROVIDER_SITE_OTHER): Payer: Medicare Other | Admitting: Gastroenterology

## 2021-03-11 VITALS — BP 101/62 | HR 68 | Temp 97.8°F | Ht 66.0 in | Wt 126.2 lb

## 2021-03-11 DIAGNOSIS — D649 Anemia, unspecified: Secondary | ICD-10-CM

## 2021-03-11 NOTE — Progress Notes (Signed)
Walter Bellows MD, MRCP(U.K) [address]  [place]  [place], [place] [address]  Main: [phone]  Fax: [phone]   Primary Care Physician: Walter Marble, MD  Primary Gastroenterologist:  Dr. Jonathon Blair   Chief complaint: Follow-up iron deficiency anemia   HPI: Walter Blair is a 75 y.o. male  Summary of history :   Initially referred and seen on 12/16/2020 for blood in stool.  He had been on Eliquis.  Weight loss of 8 pounds over 2 years.  Has had a heart attack which he attributed to weight loss 2. 12/16/2020: Hemoglobin 9.6 g with an MCV of 90, ferritin 29   12/30/2020: Upper endoscopy, colonoscopy performed on the same day.  Showed 3 mm polyp in the rectum otherwise nonbleeding internal hemorrhoids.Biopsies of the duodenum were normal and rectal polyp was hyperplastic.   Creatinine 1.4     Interval history  01/21/2020-03/11/2021  02/02/2021: Capsule study of the small bowel normal 01/05/2021: CMP: Creatinine 1.4   He is doing well not taking any iron tablets as it affected his stomach.  Current Outpatient Medications  Medication Sig Dispense Refill   amiodarone (PACERONE) 200 MG tablet One tablet twice a day for one week then one tablet daily 44 tablet 0   apixaban (ELIQUIS) 5 MG TABS tablet Take 1 tablet (5 mg total) by mouth 2 (two) times daily. 60 tablet 0   atorvastatin (LIPITOR) 80 MG tablet Take 80 mg by mouth daily.     carvedilol (COREG) 12.5 MG tablet Take 1 tablet by mouth 2 (two) times daily at 10 AM and 5 PM.     digoxin (LANOXIN) 0.125 MG tablet Take 1 tablet (0.125 mg total) by mouth daily. 30 tablet 0   ferrous sulfate (FERROUSUL) 325 (65 FE) MG tablet Take 1 tablet twice a day Blair other day for 90 days. 979 tablet 1   folic acid (FOLVITE) 1 MG tablet Take 1 mg by mouth Blair morning.     furosemide (LASIX) 20 MG tablet Take 20 mg by mouth daily.     glipiZIDE (GLUCOTROL XL) 2.5 MG 24 hr tablet Take 2.5 mg by mouth daily.     metFORMIN  (GLUCOPHAGE-XR) 500 MG 24 hr tablet Take 500-1,000 mg by mouth 2 (two) times daily. 500mg  in the morning and 1000mg  at night     pantoprazole (PROTONIX) 20 MG tablet Take 1 tablet by mouth daily.     polyethylene glycol (MIRALAX / GLYCOLAX) packet Take 17 g by mouth daily as needed. 30 each 0   RA ASPIRIN EC ADULT LOW ST 81 MG EC tablet Take 81 mg by mouth daily.     sacubitril-valsartan (ENTRESTO) 24-26 MG Take 1 tablet by mouth 2 (two) times daily. 60 tablet 0   spironolactone (ALDACTONE) 25 MG tablet Take 0.5 tablets (12.5 mg total) by mouth daily. 30 tablet 0   No current facility-administered medications for this visit.    Allergies as of 03/11/2021   (No Known Allergies)    ROS:  General: Negative for anorexia, weight loss, fever, chills, fatigue, weakness. ENT: Negative for hoarseness, difficulty swallowing , nasal congestion. CV: Negative for chest pain, angina, palpitations, dyspnea on exertion, peripheral edema.  Respiratory: Negative for dyspnea at rest, dyspnea on exertion, cough, sputum, wheezing.  GI: See history of present illness. GU:  Negative for dysuria, hematuria, urinary incontinence, urinary frequency, nocturnal urination.  Endo: Negative for unusual weight change.    Physical Examination:   BP 101/62  Pulse 68   Temp 97.8 F (36.6 C) (Oral)   Ht 5\' 6"  (1.676 m)   Wt 126 lb 3.2 oz (57.2 kg)   BMI 20.37 kg/m   General: Well-nourished, well-developed in no acute distress.  Eyes: No icterus. Conjunctivae pink. Neuro: Alert and oriented x 3.  Grossly intact. Skin: Warm and dry, no jaundice.   Psych: Alert and cooperative, normal mood and affect.   Imaging Studies: No results found.  Assessment and Plan:   Walter Blair is a 75 y.o. y/o male here today for a follow-up visit after his EGD and colonoscopy and capsule study which were performed for blood in the stool and iron deficiency anemia.  rocedures were normal except for internal hemorrhoids.      Plan 1.  Check iron studies and hemoglobin based on result to determine if he needs further evaluation with hematology as differentials would include anemia of chronic disease and MDS.     Dr Walter Bellows  MD,MRCP [facility]) Follow up in as needed

## 2021-03-12 LAB — IRON,TIBC AND FERRITIN PANEL
Ferritin: 46 ng/mL (ref 30–400)
Iron Saturation: 20 % (ref 15–55)
Iron: 72 ug/dL (ref 38–169)
Total Iron Binding Capacity: 356 ug/dL (ref 250–450)
UIBC: 284 ug/dL (ref 111–343)

## 2021-03-12 LAB — HEMOGLOBIN: Hemoglobin: 10.2 g/dL — ABNORMAL LOW (ref 13.0–17.7)

## 2021-03-13 ENCOUNTER — Ambulatory Visit: Payer: Self-pay | Admitting: Urology

## 2021-03-18 ENCOUNTER — Telehealth: Payer: Self-pay

## 2021-03-18 DIAGNOSIS — D509 Iron deficiency anemia, unspecified: Secondary | ICD-10-CM

## 2021-03-18 NOTE — Telephone Encounter (Signed)
Called patient and spoke to his brother-Khaliq and was able to explain patient's hemoglobin results and Dr. Irene Pap recommendation. Khaliq understood and had no further questions.

## 2021-03-18 NOTE — Telephone Encounter (Signed)
-----   Message from [name], MD sent at 03/16/2021 12:48 PM EDT ----- Herb Grays inform Hb is improving- recheck CBC in 8 weeks to ensure if continues to get towards normal   C/c Jodi Marble, MD   Dr [name] MD,MRCP [facility]) Gastroenterology/Hepatology Pager: [phone]

## 2021-07-01 ENCOUNTER — Encounter: Payer: Self-pay | Admitting: Urology

## 2021-07-16 ENCOUNTER — Ambulatory Visit (INDEPENDENT_AMBULATORY_CARE_PROVIDER_SITE_OTHER): Payer: Medicare Other | Admitting: Urology

## 2021-07-16 ENCOUNTER — Other Ambulatory Visit: Payer: Self-pay

## 2021-07-16 ENCOUNTER — Encounter: Payer: Self-pay | Admitting: Urology

## 2021-07-16 VITALS — BP 99/61 | HR 66 | Ht 66.0 in | Wt 139.3 lb

## 2021-07-16 DIAGNOSIS — R310 Gross hematuria: Secondary | ICD-10-CM

## 2021-07-16 DIAGNOSIS — R319 Hematuria, unspecified: Secondary | ICD-10-CM | POA: Diagnosis not present

## 2021-07-16 NOTE — Progress Notes (Signed)
07/16/21 9:48 AM   Walter Blair 10/31/1945 [id number]  CC: Gross hematuria  HPI: 75 year old male from [place] who presents with 3 to 4 months of gross hematuria without clots.  His brother helps with communication today and is his POA.  He is a comorbid 75 year old male with CAD, diabetes, anticoagulation with Xarelto, and 35-pack-year smoking history who quit in 2000.  He denies any dysuria or abdominal pain.  There is no imaging to review.  He denies other urinary symptoms like urgency/frequency, or incontinence.  He worked in [employer] and denies other carcinogenic exposures.  Urinalysis today notable for 0-5 WBCs, greater than 30 RBCs, few bacteria, nitrate negative, no leukocytes.  He has had worsening lower extremity edema over the last few months, and creatinine has been rising, most recently 1.7.  We will need to recheck prior to CT.  PMH: Past Medical History:  Diagnosis Date   CAD (coronary artery disease)    Diabetes ([place])    H/O: stroke    HTN (hypertension)     Surgical History: Past Surgical History:  Procedure Laterality Date   CARDIAC DEFIBRILLATOR PLACEMENT     CARDIOVERSION N/A 07/01/2017   Procedure: CARDIOVERSION;  Surgeon: Dionisio David, MD;  Location: [facility];  Service: Cardiovascular;  Laterality: N/A;   CARDIOVERSION N/A 07/05/2017   Procedure: CARDIOVERSION;  Surgeon: Dionisio David, MD;  Location: [facility];  Service: Cardiovascular;  Laterality: N/A;   COLONOSCOPY WITH PROPOFOL N/A 12/30/2020   Procedure: COLONOSCOPY WITH PROPOFOL;  Surgeon: [name], MD;  Location: [facility] END[facility]COPY;  Service: Gastroenterology;  Laterality: N/A;   CORONARY ARTERY BYPASS GRAFT     ESOPHAGOGASTRODUODENOSCOPY  12/30/2020   Procedure: ESOPHAGOGASTRODUODENOSCOPY (EGD);  Surgeon: [name], MD;  Location: [facility]COPY;  Service: Gastroenterology;;   GIVENS CAPSULE STUDY N/A 02/02/2021   Procedure: GIVENS CAPSULE STUDY;  Surgeon: [name], MD;  Location: [facility]  ENDOSCOPY;  Service: Gastroenterology;  Laterality: N/A;  WILL NEED INTERPRETER ON WHEELS;  BROTHER WANTS TO TRANSLATE   TEE WITHOUT CARDIOVERSION N/A 07/01/2017   Procedure: TRANSESOPHAGEAL ECHOCARDIOGRAM (TEE);  Surgeon: Dionisio David, MD;  Location: [facility];  Service: Cardiovascular;  Laterality: N/A;   TEE WITHOUT CARDIOVERSION N/A 07/05/2017   Procedure: TRANSESOPHAGEAL ECHOCARDIOGRAM (TEE);  Surgeon: Dionisio David, MD;  Location: [facility];  Service: Cardiovascular;  Laterality: N/A;     Family History: Family History  Problem Relation Age of Onset   Diabetes Brother    Hypertension Mother    Diabetes Sister     Social History:  reports that he has quit smoking. His smoking use included cigarettes. He has quit using smokeless tobacco.  His smokeless tobacco use included chew. He reports that he does not drink alcohol and does not use drugs.  Physical Exam: BP 99/61 (BP Location: Left Arm, Patient Position: Sitting, Cuff Size: Normal)   Pulse 66   Ht 5\' 6"  (1.676 m)   Wt 139 lb 4.8 oz (63.2 kg)   BMI 22.48 kg/m    Constitutional:  Alert and oriented, No acute distress. Cardiovascular: No clubbing, cyanosis, or edema. Respiratory: Normal respiratory effort, no increased work of breathing. GI: Abdomen is soft, nontender, nondistended, no abdominal masses   Assessment & Plan:   75 year old male with 3 to 4 months of gross hematuria, anticoagulated with Xarelto and significant cardiac history, CKD with worsening renal function as well over the last few months.  We discussed common possible etiologies of hematuria including BPH, malignancy, urolithiasis, medical renal disease, and  idiopathic. Standard workup recommended by the AUA includes imaging with CT urogram to assess the upper tracts, and cystoscopy. Cytology is performed on patient's with gross hematuria to look for malignant cells in the urine.  CT urogram(will need repeat BMP prior) and cystoscopy  Nickolas Madrid,  MD 07/16/2021  [facility] [address] [place], [place] [address] [phone]

## 2021-07-16 NOTE — Patient Instructions (Signed)
Cystoscopy Cystoscopy is a procedure that is used to help diagnose and sometimes treat conditions that affect the lower urinary tract. The lower urinary tract includes the bladder and the urethra. The urethra is the tube that drains urine from the bladder. Cystoscopy is done using a thin, tube-shaped instrument with a light and camera at the end (cystoscope). The cystoscope may be hard or flexible, depending on the goal of the procedure. The cystoscope is inserted through the urethra, into the bladder. Cystoscopy may be recommended if you have: Urinary tract infections that keep coming back. Blood in the urine (hematuria). An inability to control when you urinate (urinary incontinence) or an overactive bladder. Unusual cells found in a urine sample. A blockage in the urethra, such as a urinary stone. Painful urination. An abnormality in the bladder found during an intravenous pyelogram (IVP) or CT scan. Cystoscopy may also be done to remove a sample of tissue to be examined under a microscope (biopsy). Tell a health care provider about: Any allergies you have. All medicines you are taking, including vitamins, herbs, eye drops, creams, and over-the-counter medicines. Any problems you or family members have had with anesthetic medicines. Any blood disorders you have. Any surgeries you have had. Any medical conditions you have. Whether you are pregnant or may be pregnant. What are the risks? Generally, this is a safe procedure. However, problems may occur, including: Infection. Bleeding. Allergic reactions to medicines. Damage to other structures or organs. What happens before the procedure? Medicines Ask your health care provider about: Changing or stopping your regular medicines. This is especially important if you are taking diabetes medicines or blood thinners. Taking medicines such as aspirin and ibuprofen. These medicines can thin your blood. Do not take these medicines unless your  health care provider tells you to take them. Taking over-the-counter medicines, vitamins, herbs, and supplements. Tests You may have an exam or testing, such as: X-rays of the bladder, urethra, or kidneys. CT scan of the abdomen or pelvis. Urine tests to check for signs of infection. General instructions Follow instructions from your health care provider about eating or drinking restrictions. Ask your health care provider what steps will be taken to help prevent infection. These steps may include: Washing skin with a germ-killing soap. Taking antibiotic medicine. Plan to have a responsible adult take you home from the hospital or clinic. What happens during the procedure?  You will be given one or more of the following: A medicine to help you relax (sedative). A medicine to numb the area (local anesthetic). The area around the opening of your urethra will be cleaned. The cystoscope will be passed through your urethra into your bladder. Germ-free (sterile) fluid will flow through the cystoscope to fill your bladder. The fluid will stretch your bladder so that your health care provider can clearly examine your bladder walls. Your doctor will look at the urethra and bladder. Your doctor may take a biopsy or remove stones. The cystoscope will be removed, and your bladder will be emptied. The procedure may vary among health care providers and hospitals. What can I expect after the procedure? After the procedure, it is common to have: Some soreness or pain in your abdomen and urethra. Urinary symptoms. These include: Mild pain or burning when you urinate. Pain should stop within a few minutes after you urinate. This may last for up to 1 week. A small amount of blood in your urine for several days. Feeling like you need to urinate but producing only   a small amount of urine. Follow these instructions at home: Medicines Take over-the-counter and prescription medicines only as told by your  health care provider. If you were prescribed an antibiotic medicine, take it as told by your health care provider. Do not stop taking the antibiotic even if you start to feel better. General instructions Return to your normal activities as told by your health care provider. Ask your health care provider what activities are safe for you. If you were given a sedative during the procedure, it can affect you for several hours. Do not drive or operate machinery until your health care provider says that it is safe. Watch for any blood in your urine. If the amount of blood in your urine increases, call your health care provider. Follow instructions from your health care provider about eating or drinking restrictions. If a tissue sample was removed for testing (biopsy) during your procedure, it is up to you to get your test results. Ask your health care provider, or the department that is doing the test, when your results will be ready. Drink enough fluid to keep your urine pale yellow. Keep all follow-up visits. This is important. Contact a health care provider if: You have pain that gets worse or does not get better with medicine, especially pain when you urinate. You have trouble urinating. You have more blood in your urine. Get help right away if: You have blood clots in your urine. You have abdominal pain. You have a fever or chills. You are unable to urinate. Summary Cystoscopy is a procedure that is used to help diagnose and sometimes treat conditions that affect the lower urinary tract. Cystoscopy is done using a thin, tube-shaped instrument with a light and camera at the end. After the procedure, it is common to have some soreness or pain in your abdomen and urethra. Watch for any blood in your urine. If the amount of blood in your urine increases, call your health care provider. If you were prescribed an antibiotic medicine, take it as told by your health care provider. Do not stop taking  the antibiotic even if you start to feel better. This information is not intended to replace advice given to you by your health care provider. Make sure you discuss any questions you have with your health care provider. Document Revised: 04/18/2020 Document Reviewed: 04/18/2020 Elsevier Patient Education  [place].

## 2021-07-17 LAB — URINALYSIS, COMPLETE
Bilirubin, UA: NEGATIVE
Glucose, UA: NEGATIVE
Ketones, UA: NEGATIVE
Leukocytes,UA: NEGATIVE
Nitrite, UA: NEGATIVE
Specific Gravity, UA: 1.01 (ref 1.005–1.030)
Urobilinogen, Ur: 0.2 mg/dL (ref 0.2–1.0)
pH, UA: 5.5 (ref 5.0–7.5)

## 2021-07-17 LAB — MICROSCOPIC EXAMINATION: RBC, Urine: >30 /hpf — AB (ref 0–2)

## 2021-07-21 DIAGNOSIS — I700 Atherosclerosis of aorta: Secondary | ICD-10-CM | POA: Insufficient documentation

## 2021-07-24 ENCOUNTER — Other Ambulatory Visit: Payer: Self-pay

## 2021-07-24 ENCOUNTER — Emergency Department: Payer: Medicare Other

## 2021-07-24 ENCOUNTER — Inpatient Hospital Stay
Admission: EM | Admit: 2021-07-24 | Discharge: 2021-07-28 | DRG: 291 | Disposition: A | Discharge status: Home / Self Care | Payer: Medicare Other | Attending: Internal Medicine | Admitting: Internal Medicine

## 2021-07-24 DIAGNOSIS — Z794 Long term (current) use of insulin: Secondary | ICD-10-CM

## 2021-07-24 DIAGNOSIS — E1129 Type 2 diabetes mellitus with other diabetic kidney complication: Secondary | ICD-10-CM | POA: Diagnosis present

## 2021-07-24 DIAGNOSIS — I959 Hypotension, unspecified: Secondary | ICD-10-CM | POA: Diagnosis not present

## 2021-07-24 DIAGNOSIS — Z951 Presence of aortocoronary bypass graft: Secondary | ICD-10-CM

## 2021-07-24 DIAGNOSIS — Z720 Tobacco use: Secondary | ICD-10-CM | POA: Diagnosis not present

## 2021-07-24 DIAGNOSIS — R531 Weakness: Secondary | ICD-10-CM | POA: Diagnosis present

## 2021-07-24 DIAGNOSIS — N179 Acute kidney failure, unspecified: Secondary | ICD-10-CM | POA: Diagnosis not present

## 2021-07-24 DIAGNOSIS — Z833 Family history of diabetes mellitus: Secondary | ICD-10-CM

## 2021-07-24 DIAGNOSIS — R791 Abnormal coagulation profile: Secondary | ICD-10-CM | POA: Diagnosis present

## 2021-07-24 DIAGNOSIS — D696 Thrombocytopenia, unspecified: Secondary | ICD-10-CM | POA: Diagnosis not present

## 2021-07-24 DIAGNOSIS — R319 Hematuria, unspecified: Secondary | ICD-10-CM | POA: Diagnosis present

## 2021-07-24 DIAGNOSIS — Z8673 Personal history of transient ischemic attack (TIA), and cerebral infarction without residual deficits: Secondary | ICD-10-CM

## 2021-07-24 DIAGNOSIS — R0602 Shortness of breath: Secondary | ICD-10-CM | POA: Diagnosis present

## 2021-07-24 DIAGNOSIS — D539 Nutritional anemia, unspecified: Secondary | ICD-10-CM | POA: Diagnosis present

## 2021-07-24 DIAGNOSIS — N1831 Chronic kidney disease, stage 3a: Secondary | ICD-10-CM | POA: Diagnosis present

## 2021-07-24 DIAGNOSIS — D509 Iron deficiency anemia, unspecified: Secondary | ICD-10-CM | POA: Diagnosis present

## 2021-07-24 DIAGNOSIS — E876 Hypokalemia: Secondary | ICD-10-CM | POA: Diagnosis not present

## 2021-07-24 DIAGNOSIS — Z7901 Long term (current) use of anticoagulants: Secondary | ICD-10-CM

## 2021-07-24 DIAGNOSIS — I130 Hypertensive heart and chronic kidney disease with heart failure and stage 1 through stage 4 chronic kidney disease, or unspecified chronic kidney disease: Secondary | ICD-10-CM | POA: Diagnosis present

## 2021-07-24 DIAGNOSIS — T502X5A Adverse effect of carbonic-anhydrase inhibitors, benzothiadiazides and other diuretics, initial encounter: Secondary | ICD-10-CM | POA: Diagnosis not present

## 2021-07-24 DIAGNOSIS — I482 Chronic atrial fibrillation, unspecified: Secondary | ICD-10-CM | POA: Diagnosis present

## 2021-07-24 DIAGNOSIS — R7402 Elevation of levels of lactic acid dehydrogenase (LDH): Secondary | ICD-10-CM | POA: Diagnosis present

## 2021-07-24 DIAGNOSIS — D65 Disseminated intravascular coagulation [defibrination syndrome]: Secondary | ICD-10-CM | POA: Diagnosis present

## 2021-07-24 DIAGNOSIS — E782 Mixed hyperlipidemia: Secondary | ICD-10-CM | POA: Diagnosis present

## 2021-07-24 DIAGNOSIS — R310 Gross hematuria: Secondary | ICD-10-CM | POA: Diagnosis present

## 2021-07-24 DIAGNOSIS — I255 Ischemic cardiomyopathy: Secondary | ICD-10-CM | POA: Diagnosis present

## 2021-07-24 DIAGNOSIS — I509 Heart failure, unspecified: Secondary | ICD-10-CM

## 2021-07-24 DIAGNOSIS — I2510 Atherosclerotic heart disease of native coronary artery without angina pectoris: Secondary | ICD-10-CM | POA: Diagnosis present

## 2021-07-24 DIAGNOSIS — E1122 Type 2 diabetes mellitus with diabetic chronic kidney disease: Secondary | ICD-10-CM | POA: Diagnosis present

## 2021-07-24 DIAGNOSIS — I5023 Acute on chronic systolic (congestive) heart failure: Secondary | ICD-10-CM | POA: Diagnosis present

## 2021-07-24 DIAGNOSIS — I10 Essential (primary) hypertension: Secondary | ICD-10-CM | POA: Diagnosis present

## 2021-07-24 DIAGNOSIS — Z20822 Contact with and (suspected) exposure to covid-19: Secondary | ICD-10-CM | POA: Diagnosis present

## 2021-07-24 DIAGNOSIS — E039 Hypothyroidism, unspecified: Secondary | ICD-10-CM | POA: Diagnosis present

## 2021-07-24 DIAGNOSIS — Z7984 Long term (current) use of oral hypoglycemic drugs: Secondary | ICD-10-CM

## 2021-07-24 DIAGNOSIS — Z79899 Other long term (current) drug therapy: Secondary | ICD-10-CM

## 2021-07-24 DIAGNOSIS — N170 Acute kidney failure with tubular necrosis: Secondary | ICD-10-CM | POA: Diagnosis not present

## 2021-07-24 DIAGNOSIS — Z8249 Family history of ischemic heart disease and other diseases of the circulatory system: Secondary | ICD-10-CM

## 2021-07-24 DIAGNOSIS — Z79890 Hormone replacement therapy (postmenopausal): Secondary | ICD-10-CM

## 2021-07-24 DIAGNOSIS — I5043 Acute on chronic combined systolic (congestive) and diastolic (congestive) heart failure: Secondary | ICD-10-CM | POA: Diagnosis present

## 2021-07-24 DIAGNOSIS — E785 Hyperlipidemia, unspecified: Secondary | ICD-10-CM | POA: Diagnosis present

## 2021-07-24 DIAGNOSIS — Z95810 Presence of automatic (implantable) cardiac defibrillator: Secondary | ICD-10-CM

## 2021-07-24 LAB — CBC
HCT: 24.6 % — ABNORMAL LOW (ref 39.0–52.0)
HCT: 22.2 % — ABNORMAL LOW (ref 39.0–52.0)
Hemoglobin: 7.9 g/dL — ABNORMAL LOW (ref 13.0–17.0)
Hemoglobin: 7.3 g/dL — ABNORMAL LOW (ref 13.0–17.0)
MCH: 29.7 pg (ref 26.0–34.0)
MCH: 30.3 pg (ref 26.0–34.0)
MCHC: 32.1 g/dL (ref 30.0–36.0)
MCHC: 32.9 g/dL (ref 30.0–36.0)
MCV: 92.5 fL (ref 80.0–100.0)
MCV: 92.1 fL (ref 80.0–100.0)
Platelets: 92 10*3/uL — ABNORMAL LOW (ref 150–400)
Platelets: 78 10*3/uL — ABNORMAL LOW (ref 150–400)
RBC: 2.66 MIL/uL — ABNORMAL LOW (ref 4.22–5.81)
RBC: 2.41 MIL/uL — ABNORMAL LOW (ref 4.22–5.81)
RDW: 16.3 % — ABNORMAL HIGH (ref 11.5–15.5)
RDW: 16.5 % — ABNORMAL HIGH (ref 11.5–15.5)
WBC: 4.1 10*3/uL (ref 4.0–10.5)
WBC: 3.2 10*3/uL — ABNORMAL LOW (ref 4.0–10.5)
nRBC: 0 % (ref 0.0–0.2)
nRBC: 0 % (ref 0.0–0.2)

## 2021-07-24 LAB — LACTATE DEHYDROGENASE: LDH: 215 U/L — ABNORMAL HIGH (ref 98–192)

## 2021-07-24 LAB — URINALYSIS, COMPLETE (UACMP) WITH MICROSCOPIC
RBC / HPF: >50 RBC/hpf — ABNORMAL HIGH (ref 0–5)
Specific Gravity, Urine: 1.01 (ref 1.005–1.030)
Squamous Epithelial / HPF: NONE SEEN (ref 0–5)

## 2021-07-24 LAB — PATHOLOGIST SMEAR REVIEW

## 2021-07-24 LAB — HEPATIC FUNCTION PANEL
ALT: 13 U/L (ref 0–44)
AST: 29 U/L (ref 15–41)
Albumin: 3.7 g/dL (ref 3.5–5.0)
Alkaline Phosphatase: 49 U/L (ref 38–126)
Bilirubin, Direct: 0.3 mg/dL — ABNORMAL HIGH (ref 0.0–0.2)
Indirect Bilirubin: 1.1 mg/dL — ABNORMAL HIGH (ref 0.3–0.9)
Total Bilirubin: 1.4 mg/dL — ABNORMAL HIGH (ref 0.3–1.2)
Total Protein: 6.7 g/dL (ref 6.5–8.1)

## 2021-07-24 LAB — BASIC METABOLIC PANEL
Anion gap: 10 (ref 5–15)
BUN: 40 mg/dL — ABNORMAL HIGH (ref 8–23)
CO2: 24 mmol/L (ref 22–32)
Calcium: 8.6 mg/dL — ABNORMAL LOW (ref 8.9–10.3)
Chloride: 100 mmol/L (ref 98–111)
Creatinine, Ser: 2.19 mg/dL — ABNORMAL HIGH (ref 0.61–1.24)
GFR, Estimated: 30 mL/min — ABNORMAL LOW (ref >60)
Glucose, Bld: 193 mg/dL — ABNORMAL HIGH (ref 70–99)
Potassium: 3.9 mmol/L (ref 3.5–5.1)
Sodium: 134 mmol/L — ABNORMAL LOW (ref 135–145)

## 2021-07-24 LAB — PROTIME-INR
INR: 3.4 — ABNORMAL HIGH (ref 0.8–1.2)
Prothrombin Time: 34.5 seconds — ABNORMAL HIGH (ref 11.4–15.2)

## 2021-07-24 LAB — APTT: aPTT: 72 seconds — ABNORMAL HIGH (ref 24–36)

## 2021-07-24 LAB — RETICULOCYTES
Immature Retic Fract: 11.8 % (ref 2.3–15.9)
RBC.: 2.5 MIL/uL — ABNORMAL LOW (ref 4.22–5.81)
Retic Count, Absolute: 35 10*3/uL (ref 19.0–186.0)
Retic Ct Pct: 1.4 % (ref 0.4–3.1)

## 2021-07-24 LAB — CBG MONITORING, ED
Glucose-Capillary: 85 mg/dL (ref 70–99)
Glucose-Capillary: 82 mg/dL (ref 70–99)

## 2021-07-24 LAB — FIBRINOGEN: Fibrinogen: 308 mg/dL (ref 210–475)

## 2021-07-24 LAB — RESP PANEL BY RT-PCR (FLU A&B, COVID) ARPGX2
Influenza A by PCR: NEGATIVE
Influenza B by PCR: NEGATIVE
SARS Coronavirus 2 by RT PCR: NEGATIVE

## 2021-07-24 LAB — DAT, POLYSPECIFIC AHG (ARMC ONLY): Polyspecific AHG test: NEGATIVE

## 2021-07-24 LAB — D-DIMER, QUANTITATIVE: D-Dimer, Quant: 3.38 ug/mL-FEU — ABNORMAL HIGH (ref 0.00–0.50)

## 2021-07-24 LAB — BRAIN NATRIURETIC PEPTIDE: B Natriuretic Peptide: 3305.3 pg/mL — ABNORMAL HIGH (ref 0.0–100.0)

## 2021-07-24 MED ORDER — ONDANSETRON HCL 4 MG/2ML IJ SOLN: 4.0000 mg | Freq: Three times a day (TID) | INTRAMUSCULAR | Status: DC | PRN

## 2021-07-24 MED ORDER — ALBUTEROL SULFATE (2.5 MG/3ML) 0.083% IN NEBU: 3.0000 mL | INHALATION_SOLUTION | RESPIRATORY_TRACT | Status: DC | PRN

## 2021-07-24 MED ORDER — FUROSEMIDE 10 MG/ML IJ SOLN
8.0000 mg/h | INTRAVENOUS | Status: DC
  Administered 2021-07-24 – 2021-07-26 (×3): 8 mg/h via INTRAVENOUS
  Filled 2021-07-24: qty 8
  Filled 2021-07-24 (×2): qty 20

## 2021-07-24 MED ORDER — ACETAMINOPHEN 325 MG PO TABS: 650.0000 mg | ORAL_TABLET | Freq: Four times a day (QID) | ORAL | Status: DC | PRN

## 2021-07-24 MED ORDER — DM-GUAIFENESIN ER 30-600 MG PO TB12: 1.0000 | ORAL_TABLET | Freq: Two times a day (BID) | ORAL | Status: DC | PRN

## 2021-07-24 MED ORDER — ALBUTEROL SULFATE HFA 108 (90 BASE) MCG/ACT IN AERS
2.0000 | INHALATION_SPRAY | RESPIRATORY_TRACT | Status: DC | PRN
  Filled 2021-07-24: qty 6.7

## 2021-07-24 MED ORDER — INSULIN ASPART 100 UNIT/ML IJ SOLN
0.0000 [IU] | Freq: Three times a day (TID) | INTRAMUSCULAR | Status: DC
  Administered 2021-07-25: 5 [IU] via SUBCUTANEOUS
  Administered 2021-07-26: 2 [IU] via SUBCUTANEOUS
  Administered 2021-07-26: 3 [IU] via SUBCUTANEOUS
  Administered 2021-07-27: 1 [IU] via SUBCUTANEOUS
  Administered 2021-07-27: 2 [IU] via SUBCUTANEOUS
  Administered 2021-07-28: 3 [IU] via SUBCUTANEOUS
  Filled 2021-07-24 (×5): qty 1

## 2021-07-24 MED ORDER — INSULIN ASPART 100 UNIT/ML IJ SOLN: 0.0000 [IU] | Freq: Every day | INTRAMUSCULAR | Status: DC

## 2021-07-24 NOTE — ED Triage Notes (Signed)
Pt comes with c/o SOB and retaining fluids. Pt sent by heart doctor. Family also states pt's kidney function is low.  Pt denies any CP or other pain.

## 2021-07-24 NOTE — ED Notes (Signed)
Pt placed on external urine device. Resting in bed no needs at this time

## 2021-07-24 NOTE — Consult Note (Signed)
Walter Blair is a 75 y.o. male  [id number]  Primary Cardiologist: Neoma Laming Reason for Consultation: Congestive heart failure with HFrEF/decompensated  HPI: This is a 75 year old Walter Blair male who presented to the office this morning at 9 AM with shortness of breath and extensive swelling of the legs and distention of the abdomen.  He was initially taking Lasix 20 p.o. by twice daily 2 weeks ago and was switched over to torsemide 20 p.o. twice daily and his Aldactone was increased to 25 mg from 12.5 mg once a day.   Review of Systems: No chest pain   Past Medical History:  Diagnosis Date   CAD (coronary artery disease)    Diabetes ([place])    H/O: stroke    HTN (hypertension)     (Not in a hospital admission)     insulin aspart  0-5 Units Subcutaneous QHS   insulin aspart  0-9 Units Subcutaneous TID WC    Infusions:   No Known Allergies  Social History   Socioeconomic History   Marital status: Widowed    Spouse name: Not on file   Number of children: Not on file   Years of education: Not on file   Highest education level: Not on file  Occupational History   Not on file  Tobacco Use   Smoking status: Former    Types: Cigarettes   Smokeless tobacco: Former    Types: Chew  Substance and Sexual Activity   Alcohol use: No   Drug use: No   Sexual activity: Not Currently  Other Topics Concern   Not on file  Social History Narrative   Not on file   Social Determinants of Health   Financial Resource Strain: Not on file  Food Insecurity: Not on file  Transportation Needs: Not on file  Physical Activity: Not on file  Stress: Not on file  Social Connections: Not on file  Intimate Partner Violence: Not on file    Family History  Problem Relation Age of Onset   Diabetes Brother    Hypertension Mother    Diabetes Sister     PHYSICAL EXAM: Vitals:   07/24/21 1015 07/24/21 1305  BP: (!) 89/70 101/64  Pulse: 62 (!) 58  Resp: 20 20  Temp:  97.9 F (36.6 C)   SpO2: 100% 98%    No intake or output data in the 24 hours ending 07/24/21 1510  General:  Well appearing. No respiratory difficulty HEENT: normal Neck: supple. no JVD. Carotids 2+ bilat; no bruits. No lymphadenopathy or thryomegaly appreciated. Cor: PMI nondisplaced. Regular rate & rhythm. No rubs, gallops or murmurs. Lungs: clear Abdomen: soft, nontender, nondistended. No hepatosplenomegaly. No bruits or masses. Good bowel sounds. Extremities: no cyanosis, clubbing, rash, edema Neuro: alert & oriented x 3, cranial nerves grossly intact. moves all 4 extremities w/o difficulty. Affect pleasant.  ECG: Sinus bradycardia 55 bpm nonspecific ST-T changes  Results for orders placed or performed during the hospital encounter of 07/24/21 (from the past 24 hour(s))  CBC     Status: Abnormal   Collection Time: 07/24/21 10:11 AM  Result Value Ref Range   WBC 4.1 4.0 - 10.5 K/uL   RBC 2.66 (L) 4.22 - 5.81 MIL/uL   Hemoglobin 7.9 (L) 13.0 - 17.0 g/dL   HCT 24.6 (L) 39.0 - 52.0 %   MCV 92.5 80.0 - 100.0 fL   MCH 29.7 26.0 - 34.0 pg   MCHC 32.1 30.0 - 36.0 g/dL   RDW 16.3 (  H) 11.5 - 15.5 %   Platelets 92 (L) 150 - 400 K/uL   nRBC 0.0 0.0 - 0.2 %  Basic metabolic panel     Status: Abnormal   Collection Time: 07/24/21 10:11 AM  Result Value Ref Range   Sodium 134 (L) 135 - 145 mmol/L   Potassium 3.9 3.5 - 5.1 mmol/L   Chloride 100 98 - 111 mmol/L   CO2 24 22 - 32 mmol/L   Glucose, Bld 193 (H) 70 - 99 mg/dL   BUN 40 (H) 8 - 23 mg/dL   Creatinine, Ser 2.19 (H) 0.61 - 1.24 mg/dL   Calcium 8.6 (L) 8.9 - 10.3 mg/dL   GFR, Estimated 30 (L) >60 mL/min   Anion gap 10 5 - 15  Brain natriuretic peptide     Status: Abnormal   Collection Time: 07/24/21 10:11 AM  Result Value Ref Range   B Natriuretic Peptide 3,305.3 (H) 0.0 - 100.0 pg/mL  Pathologist smear review     Status: None   Collection Time: 07/24/21 10:11 AM  Result Value Ref Range   Path Review Blood smear is  reviewed.   D-dimer, quantitative     Status: Abnormal   Collection Time: 07/24/21 10:11 AM  Result Value Ref Range   D-Dimer, Quant 3.38 (H) 0.00 - 0.50 ug/mL-FEU  Fibrinogen     Status: None   Collection Time: 07/24/21 10:11 AM  Result Value Ref Range   Fibrinogen 308 210 - 475 mg/dL  Lactate dehydrogenase     Status: Abnormal   Collection Time: 07/24/21 10:11 AM  Result Value Ref Range   LDH 215 (H) 98 - 192 U/L  Hepatic function panel     Status: Abnormal   Collection Time: 07/24/21 10:11 AM  Result Value Ref Range   Total Protein 6.7 6.5 - 8.1 g/dL   Albumin 3.7 3.5 - 5.0 g/dL   AST 29 15 - 41 U/L   ALT 13 0 - 44 U/L   Alkaline Phosphatase 49 38 - 126 U/L   Total Bilirubin 1.4 (H) 0.3 - 1.2 mg/dL   Bilirubin, Direct 0.3 (H) 0.0 - 0.2 mg/dL   Indirect Bilirubin 1.1 (H) 0.3 - 0.9 mg/dL  Protime-INR     Status: Abnormal   Collection Time: 07/24/21 12:30 PM  Result Value Ref Range   Prothrombin Time 34.5 (H) 11.4 - 15.2 seconds   INR 3.4 (H) 0.8 - 1.2  Reticulocytes     Status: Abnormal   Collection Time: 07/24/21 12:30 PM  Result Value Ref Range   Retic Ct Pct 1.4 0.4 - 3.1 %   RBC. 2.50 (L) 4.22 - 5.81 MIL/uL   Retic Count, Absolute 35.0 19.0 - 186.0 K/uL   Immature Retic Fract 11.8 2.3 - 15.9 %  APTT     Status: Abnormal   Collection Time: 07/24/21 12:30 PM  Result Value Ref Range   aPTT 72 (H) 24 - 36 seconds  Resp Panel by RT-PCR (Flu A&B, Covid) Nasopharyngeal Swab     Status: None   Collection Time: 07/24/21 12:32 PM   Specimen: Nasopharyngeal Swab; Nasopharyngeal(NP) swabs in vial transport medium  Result Value Ref Range   SARS Coronavirus 2 by RT PCR NEGATIVE NEGATIVE   Influenza A by PCR NEGATIVE NEGATIVE   Influenza B by PCR NEGATIVE NEGATIVE  Urinalysis, Complete w Microscopic     Status: Abnormal   Collection Time: 07/24/21  1:08 PM  Result Value Ref Range   Color, Urine RED (A)  YELLOW   APPearance TURBID (A) CLEAR   Specific Gravity, Urine 1.010  1.005 - 1.030   pH  5.0 - 8.0    TEST NOT REPORTED DUE TO COLOR INTERFERENCE OF URINE PIGMENT   Glucose, UA (A) NEGATIVE mg/dL    TEST NOT REPORTED DUE TO COLOR INTERFERENCE OF URINE PIGMENT   Hgb urine dipstick (A) NEGATIVE    TEST NOT REPORTED DUE TO COLOR INTERFERENCE OF URINE PIGMENT   Bilirubin Urine (A) NEGATIVE    TEST NOT REPORTED DUE TO COLOR INTERFERENCE OF URINE PIGMENT   Ketones, ur (A) NEGATIVE mg/dL    TEST NOT REPORTED DUE TO COLOR INTERFERENCE OF URINE PIGMENT   Protein, ur (A) NEGATIVE mg/dL    TEST NOT REPORTED DUE TO COLOR INTERFERENCE OF URINE PIGMENT   Nitrite (A) NEGATIVE    TEST NOT REPORTED DUE TO COLOR INTERFERENCE OF URINE PIGMENT   Leukocytes,Ua (A) NEGATIVE    TEST NOT REPORTED DUE TO COLOR INTERFERENCE OF URINE PIGMENT   RBC / HPF >50 (H) 0 - 5 RBC/hpf   WBC, UA 6-10 0 - 5 WBC/hpf   Bacteria, UA FEW (A) NONE SEEN   Squamous Epithelial / LPF NONE SEEN 0 - 5   Non Squamous Epithelial PRESENT (A) NONE SEEN   CT ABDOMEN PELVIS WO CONTRAST  Result Date: 07/24/2021 CLINICAL DATA:  Abdominal distension, shortness of breath, retained fluid EXAM: CT ABDOMEN AND PELVIS WITHOUT CONTRAST TECHNIQUE: Multidetector CT imaging of the abdomen and pelvis was performed following the standard protocol without IV contrast. COMPARISON:  None. FINDINGS: Lower chest: Cardiomegaly without pericardial effusion. Pacer wires in the right heart. Previous median sternotomy. Native coronary atherosclerosis present. Trace pleural effusions. Mild bibasilar reticulonodular and peripheral tree in bud opacities worse on the left may represent mild bibasilar pneumonitis/pneumonia. Left lower lobe mild cylindrical bronchiectasis noted. Nonspecific nodular pleural thickening on the left. This is incompletely evaluated by abdominal imaging only. Hepatobiliary: Limited without IV contrast. Distended hepatic IVC and hepatic veins compatible with right heart failure. Peri portal edema suspected. No  biliary obstruction pattern or large focal hepatic abnormality. Small layering calcified gallstones noted. Gallbladder nondistended. Common bile duct nondilated. Pancreas: Unremarkable. No pancreatic ductal dilatation or surrounding inflammatory changes. Spleen: Normal in size without focal abnormality. Adrenals/Urinary Tract: Nonspecific adrenal thickening bilaterally. Limited evaluation without contrast. No renal obstruction pattern. Renal vascular calcifications present. No hydroureter or ureteral calculus. No hydronephrosis. Posterior nonspecific bladder wall thickening. Stomach/Bowel: Negative for bowel obstruction, significant dilatation, ileus, or free air. Normal appendix. Small amount of upper abdomen perihepatic and perisplenic ascites. Small amount of dependent pelvic ascites. No focal fluid collection or abscess. Vascular/Lymphatic: Limited without IV contrast. Abdominal atherosclerosis noted. Negative for aneurysm. No retroperitoneal hemorrhage or hematoma. No bulky adenopathy. Reproductive: No significant finding by CT. Other: Intact abdominal wall.  No hernia.  No inguinal abnormality. Musculoskeletal: Degenerative changes of the spine and facet joints. Old compression fracture at L4. Bones are osteopenic. IMPRESSION: Cardiomegaly. Trace pleural effusions and bibasilar reticulonodular/tree in bud mild opacities worse on the left. Difficult to exclude mild bibasilar bronchopneumonia. Small amount abdominopelvic ascites. Cholelithiasis Abdominal atherosclerosis No other acute intra-abdominal finding by noncontrast CT. Electronically Signed   By: Jerilynn Mages.  Shick M.D.   On: 07/24/2021 14:53   DG Chest 2 View  Result Date: 07/24/2021 CLINICAL DATA:  sob EXAM: CHEST - 2 VIEW COMPARISON:  July 05, 2017 FINDINGS: The cardiomediastinal silhouette is unchanged in contour.Status post median sternotomy and CABG. LEFT chest AICD. Small LEFT  pleural effusion. No pneumothorax. Mild diffuse interstitial prominence.  LEFT retrocardiac opacities. Visualized abdomen is unremarkable. Mild degenerative changes of the thoracic spine. IMPRESSION: Constellation of findings are favored to reflect mild pulmonary edema with LEFT basilar atelectasis and small LEFT pleural effusion. Electronically Signed   By: Valentino Saxon M.D.   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  Patient has failed outpatient diuretic therapy with significant swelling of the legs and distention of the abdomen with possible ascites.  He had a creatinine of 1.13 and it has gone up to 2.0 with increasing diuretics which were torsemide 20 twice daily and Aldactone 25.  He normally runs blood pressure systolic around 353.  But he is taking Entresto which can be technically held while we do diuresis.  I advised that we start on Lasix drip and milrinone can be considered but will have to be in ICU.  He had hematuria and he is seeing urologist and hematologist but we continued Xarelto 15 mg once a day.  Jonatan Wilsey A

## 2021-07-24 NOTE — ED Notes (Signed)
Pt voided 148ml in external cath, pvr volume 122mL

## 2021-07-24 NOTE — ED Notes (Signed)
Pt requesting blanket and heat turned up. Pt given warm blanket x2 and heat turned up in room.

## 2021-07-24 NOTE — Consult Note (Signed)
[facility] Consult Note: 07/24/21    Date of Admission:  07/24/2021           Reason for Consult: AKI     Referring Provider: Ivor Costa, MD Primary Care Provider: Jodi Marble, MD   History of Presenting Illness:  Walter Blair is a 75 y.o. male  with medical problems of CAD, DM, h/o stroke, HTN, A Fib, hematuria, HLD Sent from Dr Laurelyn Sickle cardiology clinic for shortness of breath and excessive LE edema and abdominal distension Also undergoing workup for gross hematuria as outpatient.  CT abdomen without contrast - neg for any obvious cause for hematuria  Baseline Creatinine of 1.4/GFR 49 from 01/05/2021 Noted to be hypotensive to BP 89/70 in ER   Review of Systems: ROS  Gen: Denies any fevers or chills HEENT: No vision or hearing problems CV: + worsening LE edema Resp: No cough or sputum production GI: No nausea, vomiting or diarrhea.  No blood in the stool GU : No problems with voiding.  +  hematuria.    MS:  Denies any acute joint pain or swelling Derm:   No complaints Psych: No complaints Heme: No complaints Neuro: No complaints Endocrine: No complaints   Past Medical History:  Diagnosis Date   CAD (coronary artery disease)    Diabetes ([place])    H/O: stroke    HTN (hypertension)     Social History   Tobacco Use   Smoking status: Former    Types: Cigarettes   Smokeless tobacco: Former    Types: Chew  Substance Use Topics   Alcohol use: No   Drug use: No    Family History  Problem Relation Age of Onset   Diabetes Brother    Hypertension Mother    Diabetes Sister      OBJECTIVE: Blood pressure 107/60, pulse (!) 58, temperature 97.9 F (36.6 C), temperature source Oral, resp. rate 17, height 5\' 6"  (1.676 m), weight 64 kg, SpO2 98 %.  Physical Exam  Physical Exam: General:  No acute distress, laying in the bed  HEENT  anicteric, moist oral mucous membrane  Pulm/lungs  normal breathing effort, mild basilar crackles   CVS/Heart  3/6 systolic murmur   Abdomen:   Soft, nontender  Extremities:  ++ peripheral edema  Neurologic:  Alert, oriented, able to follow commands  Skin:  No acute rashes    Lab Results Lab Results  Component Value Date   WBC 4.1 07/24/2021   HGB 7.9 (L) 07/24/2021   HCT 24.6 (L) 07/24/2021   MCV 92.5 07/24/2021   PLT 92 (L) 07/24/2021    Lab Results  Component Value Date   CREATININE 2.19 (H) 07/24/2021   BUN 40 (H) 07/24/2021   NA 134 (L) 07/24/2021   K 3.9 07/24/2021   CL 100 07/24/2021   CO2 24 07/24/2021    Lab Results  Component Value Date   ALT 13 07/24/2021   AST 29 07/24/2021   ALKPHOS 49 07/24/2021   BILITOT 1.4 (H) 07/24/2021     Microbiology: Recent Results (from the past 240 hour(s))  Microscopic Examination     Status: Abnormal   Collection Time: 07/16/21  8:46 AM   Urine  Result Value Ref Range Status   WBC, UA 0-5 0 - 5 /hpf Final   RBC >30 (A) 0 - 2 /hpf Final   Epithelial Cells (non renal) 0-10 0 - 10 /hpf Final   Casts Present (A) None seen /lpf Final  Cast Type Hyaline casts N/A Final   Bacteria, UA Few None seen/Few Final  Resp Panel by RT-PCR (Flu A&B, Covid) Nasopharyngeal Swab     Status: None   Collection Time: 07/24/21 12:32 PM   Specimen: Nasopharyngeal Swab; Nasopharyngeal(NP) swabs in vial transport medium  Result Value Ref Range Status   SARS Coronavirus 2 by RT PCR NEGATIVE NEGATIVE Final    Comment: (NOTE) SARS-CoV-2 target nucleic acids are NOT DETECTED.  The SARS-CoV-2 RNA is generally detectable in upper respiratory specimens during the acute phase of infection. The lowest concentration of SARS-CoV-2 viral copies this assay can detect is 138 copies/mL. A negative result does not preclude SARS-Cov-2 infection and should not be used as the sole basis for treatment or other patient management decisions. A negative result may occur with  improper specimen collection/handling, submission of specimen other than  nasopharyngeal swab, presence of viral mutation(s) within the areas targeted by this assay, and inadequate number of viral copies(<138 copies/mL). A negative result must be combined with clinical observations, patient history, and epidemiological information. The expected result is Negative.  Fact Sheet for Patients:  EntrepreneurPulse.com.au  Fact Sheet for Healthcare Providers:  IncredibleEmployment.be  This test is no t yet approved or cleared by the [place] FDA and  has been authorized for detection and/or diagnosis of SARS-CoV-2 by FDA under an Emergency Use Authorization (EUA). This EUA will remain  in effect (meaning this test can be used) for the duration of the COVID-19 declaration under Section 564(b)(1) of the Act, 21 U.S.C.section 360bbb-3(b)(1), unless the authorization is terminated  or revoked sooner.       Influenza A by PCR NEGATIVE NEGATIVE Final   Influenza B by PCR NEGATIVE NEGATIVE Final    Comment: (NOTE) The Xpert Xpress SARS-CoV-2/FLU/RSV plus assay is intended as an aid in the diagnosis of influenza from Nasopharyngeal swab specimens and should not be used as a sole basis for treatment. Nasal washings and aspirates are unacceptable for Xpert Xpress SARS-CoV-2/FLU/RSV testing.  Fact Sheet for Patients: EntrepreneurPulse.com.au  Fact Sheet for Healthcare Providers: IncredibleEmployment.be  This test is not yet approved or cleared by the [place] FDA and has been authorized for detection and/or diagnosis of SARS-CoV-2 by FDA under an Emergency Use Authorization (EUA). This EUA will remain in effect (meaning this test can be used) for the duration of the COVID-19 declaration under Section 564(b)(1) of the Act, 21 U.S.C. section 360bbb-3(b)(1), unless the authorization is terminated or revoked.  Performed at [facility], [address] [place], [place]  [address]     Medications: Scheduled Meds:  insulin aspart  0-5 Units Subcutaneous QHS   insulin aspart  0-9 Units Subcutaneous TID WC   Continuous Infusions:  furosemide (LASIX) 200 mg in dextrose 5% 100 mL (2mg /mL) infusion     PRN Meds:.acetaminophen, albuterol, dextromethorphan-guaiFENesin, ondansetron (ZOFRAN) IV  No Known Allergies  Urinalysis: Recent Labs    07/24/21 1308  COLORURINE RED*  LABSPEC 1.010  PHURINE TEST NOT REPORTED DUE TO COLOR INTERFERENCE OF URINE PIGMENT  GLUCOSEU TEST NOT REPORTED DUE TO COLOR INTERFERENCE OF URINE PIGMENT*  HGBUR TEST NOT REPORTED DUE TO COLOR INTERFERENCE OF URINE PIGMENT*  BILIRUBINUR TEST NOT REPORTED DUE TO COLOR INTERFERENCE OF URINE PIGMENT*  KETONESUR TEST NOT REPORTED DUE TO COLOR INTERFERENCE OF URINE PIGMENT*  PROTEINUR TEST NOT REPORTED DUE TO COLOR INTERFERENCE OF URINE PIGMENT*  NITRITE TEST NOT REPORTED DUE TO COLOR INTERFERENCE OF URINE PIGMENT*  LEUKOCYTESUR TEST NOT REPORTED DUE TO  COLOR INTERFERENCE OF URINE PIGMENT*      Imaging: CT ABDOMEN PELVIS WO CONTRAST  Result Date: 07/24/2021 CLINICAL DATA:  Abdominal distension, shortness of breath, retained fluid EXAM: CT ABDOMEN AND PELVIS WITHOUT CONTRAST TECHNIQUE: Multidetector CT imaging of the abdomen and pelvis was performed following the standard protocol without IV contrast. COMPARISON:  None. FINDINGS: Lower chest: Cardiomegaly without pericardial effusion. Pacer wires in the right heart. Previous median sternotomy. Native coronary atherosclerosis present. Trace pleural effusions. Mild bibasilar reticulonodular and peripheral tree in bud opacities worse on the left may represent mild bibasilar pneumonitis/pneumonia. Left lower lobe mild cylindrical bronchiectasis noted. Nonspecific nodular pleural thickening on the left. This is incompletely evaluated by abdominal imaging only. Hepatobiliary: Limited without IV contrast. Distended hepatic IVC and hepatic veins  compatible with right heart failure. Peri portal edema suspected. No biliary obstruction pattern or large focal hepatic abnormality. Small layering calcified gallstones noted. Gallbladder nondistended. Common bile duct nondilated. Pancreas: Unremarkable. No pancreatic ductal dilatation or surrounding inflammatory changes. Spleen: Normal in size without focal abnormality. Adrenals/Urinary Tract: Nonspecific adrenal thickening bilaterally. Limited evaluation without contrast. No renal obstruction pattern. Renal vascular calcifications present. No hydroureter or ureteral calculus. No hydronephrosis. Posterior nonspecific bladder wall thickening. Stomach/Bowel: Negative for bowel obstruction, significant dilatation, ileus, or free air. Normal appendix. Small amount of upper abdomen perihepatic and perisplenic ascites. Small amount of dependent pelvic ascites. No focal fluid collection or abscess. Vascular/Lymphatic: Limited without IV contrast. Abdominal atherosclerosis noted. Negative for aneurysm. No retroperitoneal hemorrhage or hematoma. No bulky adenopathy. Reproductive: No significant finding by CT. Other: Intact abdominal wall.  No hernia.  No inguinal abnormality. Musculoskeletal: Degenerative changes of the spine and facet joints. Old compression fracture at L4. Bones are osteopenic. IMPRESSION: Cardiomegaly. Trace pleural effusions and bibasilar reticulonodular/tree in bud mild opacities worse on the left. Difficult to exclude mild bibasilar bronchopneumonia. Small amount abdominopelvic ascites. Cholelithiasis Abdominal atherosclerosis No other acute intra-abdominal finding by noncontrast CT. Electronically Signed   By: Jerilynn Mages.  Shick M.D.   On: 07/24/2021 14:53   DG Chest 2 View  Result Date: 07/24/2021 CLINICAL DATA:  sob EXAM: CHEST - 2 VIEW COMPARISON:  July 05, 2017 FINDINGS: The cardiomediastinal silhouette is unchanged in contour.Status post median sternotomy and CABG. LEFT chest AICD. Small LEFT  pleural effusion. No pneumothorax. Mild diffuse interstitial prominence. LEFT retrocardiac opacities. Visualized abdomen is unremarkable. Mild degenerative changes of the thoracic spine. IMPRESSION: Constellation of findings are favored to reflect mild pulmonary edema with LEFT basilar atelectasis and small LEFT pleural effusion. Electronically Signed   By: Valentino Saxon M.D.   On: 07/24/2021 10:48      Assessment/Plan:  Walter Blair is a 75 y.o. male with medical problems of      was admitted on 07/24/2021 for :  Acute on chronic systolic CHF (congestive heart failure) ([place]) [I50.23]  1 AKI 2 CKD st 3 A. Baseline Creatinine of 1.4/GFR 49 from 01/05/2021 3 Gross hematuria 4 Hypotension 5 Acute Exacerbation of chronic systolic CHF. (EF 53-97 % per chart) 6 Thrombocytopenia  Cause for AKI is not entirely clear but may be related to cardiorenal syndrome and abnormal hemodynamics  Plan Agree with iv lasix infusion Hold metformin, xarelto, digoxin, entresto, spironolactone, VERCIGUAT  Avoid Hypotension, iv contrast, NDAIDs Bladder scan Hematology eval pending Urology evaluation if he develops urinary retention or clot  Electrolytes and Volume status are acceptable No acute indication for Dialysis at present  Case discussed with patient and his brother    Murlean Iba 07/24/21

## 2021-07-24 NOTE — Consult Note (Signed)
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  INTERVAL HISTORY: Patient is a 75 year old male who was sent to the emergency room with significant overload and CHF exacerbation.  Upon work-up he was noted to have a significant anemia and thrombocytopenia, and a mild renal insufficiency.  Path review of peripheral smear revealed schistocytes.  Hematology consulted for concern for TTP versus DIC.  He has no neurologic complaints.  He denies any recent fevers or illnesses.  He has a fair appetite and denies weight loss.  He has no chest pain, cough, shortness of breath, or hemoptysis.  He denies any nausea, vomiting, constipation, or diarrhea.  He has no urinary complaints.  He has significant peripheral edema.  Patient offers no further specific complaints today.  REVIEW OF SYSTEMS:   Review of Systems  Constitutional:  Positive for malaise/fatigue. Negative for fever and weight loss.  Respiratory: Negative.  Negative for cough, hemoptysis and shortness of breath.   Cardiovascular:  Positive for leg swelling. Negative for chest pain.  Gastrointestinal: Negative.  Negative for abdominal pain, blood in stool and melena.  Genitourinary:  Positive for hematuria.  Musculoskeletal: Negative.  Negative for back pain.  Skin: Negative.  Negative for rash.  Neurological:  Positive for weakness. Negative for dizziness, focal weakness and headaches.  Psychiatric/Behavioral: Negative.  The patient is not nervous/anxious.    As per HPI. Otherwise, a complete review of systems is negative.  PAST MEDICAL HISTORY: Past Medical History:  Diagnosis Date   CAD (coronary artery disease)    Diabetes ([place])    H/O: stroke    HTN (hypertension)     PAST SURGICAL  HISTORY: Past Surgical History:  Procedure Laterality Date   CARDIAC DEFIBRILLATOR PLACEMENT     CARDIOVERSION N/A 07/01/2017   Procedure: CARDIOVERSION;  Surgeon: Dionisio David, MD;  Location: [facility];  Service: Cardiovascular;  Laterality: N/A;   CARDIOVERSION N/A 07/05/2017   Procedure: CARDIOVERSION;  Surgeon: Dionisio David, MD;  Location: [facility];  Service: Cardiovascular;  Laterality: N/A;   COLONOSCOPY WITH PROPOFOL N/A 12/30/2020   Procedure: COLONOSCOPY WITH PROPOFOL;  Surgeon: [name], MD;  Location: [facility] ENDOSCOPY;  Service: Gastroenterology;  Laterality: N/A;   CORONARY ARTERY BYPASS GRAFT     ESOPHAGOGASTRODUODENOSCOPY  12/30/2020   Procedure: ESOPHAGOGASTRODUODENOSCOPY (EGD);  Surgeon: [name], MD;  Location: [facility] ENDOSCOPY;  Service: Gastroenterology;;   GIVENS CAPSULE STUDY N/A 02/02/2021   Procedure: GIVENS CAPSULE STUDY;  Surgeon: [name], MD;  Location: [facility] ENDOSCOPY;  Service: Gastroenterology;  Laterality: N/A;  WILL NEED INTERPRETER ON WHEELS;  BROTHER WANTS TO TRANSLATE   TEE WITHOUT CARDIOVERSION N/A 07/01/2017   Procedure: TRANSESOPHAGEAL ECHOCARDIOGRAM (TEE);  Surgeon: Dionisio David, MD;  Location: [facility];  Service: Cardiovascular;  Laterality: N/A;   TEE WITHOUT CARDIOVERSION N/A 07/05/2017   Procedure: TRANSESOPHAGEAL ECHOCARDIOGRAM (TEE);  Surgeon: Dionisio David, MD;  Location: [facility];  Service: Cardiovascular;  Laterality: N/A;    FAMILY HISTORY: Family History  Problem Relation Age of Onset   Diabetes Brother    Hypertension Mother    Diabetes Sister     ADVANCED DIRECTIVES (Y/N):  @ADVDIR @  HEALTH MAINTENANCE: Social History   Tobacco Use   Smoking status: Former    Types: Cigarettes   Smokeless tobacco: Former    Types:  Chew  Substance Use Topics   Alcohol use: No   Drug use: No     Colonoscopy:  PAP:  Bone density:  Lipid panel:  Allergies  Allergen Reactions   Ativan [Lorazepam] Shortness Of Breath and Other  (See Comments)    Pt experienced adverse reaction and was transferred to ICU last time they were given Med    Current Facility-Administered Medications  Medication Dose Route Frequency Provider Last Rate Last Admin   acetaminophen (TYLENOL) tablet 650 mg  650 mg Oral Q6H PRN Ivor Costa, MD       albuterol (PROVENTIL) (2.5 MG/3ML) 0.083% nebulizer solution 3 mL  3 mL Nebulization Q4H PRN Ivor Costa, MD       dextromethorphan-guaiFENesin (MUCINEX DM) 30-600 MG per 12 hr tablet 1 tablet  1 tablet Oral BID PRN Ivor Costa, MD       furosemide (LASIX) 200 mg in dextrose 5 % 100 mL (2 mg/mL) infusion  8 mg/hr Intravenous Continuous Ivor Costa, MD 4 mL/hr at 07/24/21 1709 8 mg/hr at 07/24/21 1709   insulin aspart (novoLOG) injection 0-5 Units  0-5 Units Subcutaneous QHS Ivor Costa, MD       insulin aspart (novoLOG) injection 0-9 Units  0-9 Units Subcutaneous TID WC Ivor Costa, MD       ondansetron (ZOFRAN) injection 4 mg  4 mg Intravenous Q8H PRN Ivor Costa, MD       Current Outpatient Medications  Medication Sig Dispense Refill   amiodarone (PACERONE) 200 MG tablet One tablet twice a day for one week then one tablet daily 44 tablet 0   atorvastatin (LIPITOR) 80 MG tablet Take 80 mg by mouth daily.     folic acid (FOLVITE) 1 MG tablet Take 1 mg by mouth every morning.     glipiZIDE (GLUCOTROL XL) 2.5 MG 24 hr tablet Take 2.5 mg by mouth daily.     levothyroxine (SYNTHROID) 50 MCG tablet Start one tablet per day. After two weeks, adjust to two tablets per day. Take in the morning on an empty stomach and wait 20 min before eating.     metFORMIN (GLUCOPHAGE-XR) 500 MG 24 hr tablet Take 500-1,000 mg by mouth 2 (two) times daily. 500mg  in the morning and 1000mg  at night     pantoprazole (PROTONIX) 20 MG tablet Take 1 tablet by mouth daily.     polyethylene glycol (MIRALAX / GLYCOLAX) packet Take 17 g by mouth daily as needed. 30 each 0   sacubitril-valsartan (ENTRESTO) 24-26 MG Take 1 tablet by mouth 2  (two) times daily. 60 tablet 0   spironolactone (ALDACTONE) 25 MG tablet Take 0.5 tablets (12.5 mg total) by mouth daily. 30 tablet 0   torsemide (DEMADEX) 20 MG tablet Take 20 mg by mouth 2 (two) times daily.     Vericiguat 5 MG TABS Take by mouth.     XARELTO 15 MG TABS tablet Take 15 mg by mouth every morning.     digoxin (LANOXIN) 0.125 MG tablet Take 1 tablet (0.125 mg total) by mouth daily. (Patient not taking: Reported on 07/24/2021) 30 tablet 0   ferrous sulfate (FERROUSUL) 325 (65 FE) MG tablet Take 1 tablet twice a day every other day for 90 days. (Patient not taking: Reported on 07/24/2021) 180 tablet 1   ONETOUCH ULTRA test strip daily.      OBJECTIVE: Vitals:   07/24/21 1900 07/24/21 1930  BP: 102/69 136/89  Pulse: 60 61  Resp: 12 13  Temp:  SpO2: 100% 95%     Body mass index is 22.77 kg/m.    ECOG FS:2 - Symptomatic, <50% confined to bed  General: Well-developed, well-nourished, no acute distress. Eyes: Pink conjunctiva, anicteric sclera. HEENT: Normocephalic, moist mucous membranes. Lungs: No audible wheezing or coughing. Heart: Regular rate and rhythm. Abdomen: Soft, nontender, no obvious distention. Musculoskeletal: No edema, cyanosis, or clubbing. Neuro: Alert, answering all questions appropriately. Cranial nerves grossly intact. Skin: No rashes or petechiae noted.  Bilateral peripheral edema. Psych: Normal affect.   LAB RESULTS:  Lab Results  Component Value Date   NA 134 (L) 07/24/2021   K 3.9 07/24/2021   CL 100 07/24/2021   CO2 24 07/24/2021   GLUCOSE 193 (H) 07/24/2021   BUN 40 (H) 07/24/2021   CREATININE 2.19 (H) 07/24/2021   CALCIUM 8.6 (L) 07/24/2021   PROT 6.7 07/24/2021   ALBUMIN 3.7 07/24/2021   AST 29 07/24/2021   ALT 13 07/24/2021   ALKPHOS 49 07/24/2021   BILITOT 1.4 (H) 07/24/2021   GFRNONAA 30 (L) 07/24/2021   GFRAA >60 07/04/2017    Lab Results  Component Value Date   WBC 3.2 (L) 07/24/2021   NEUTROABS 2.8 12/16/2020   HGB  7.3 (L) 07/24/2021   HCT 22.2 (L) 07/24/2021   MCV 92.1 07/24/2021   PLT 78 (L) 07/24/2021     STUDIES: CT ABDOMEN PELVIS WO CONTRAST  Result Date: 07/24/2021 CLINICAL DATA:  Abdominal distension, shortness of breath, retained fluid EXAM: CT ABDOMEN AND PELVIS WITHOUT CONTRAST TECHNIQUE: Multidetector CT imaging of the abdomen and pelvis was performed following the standard protocol without IV contrast. COMPARISON:  None. FINDINGS: Lower chest: Cardiomegaly without pericardial effusion. Pacer wires in the right heart. Previous median sternotomy. Native coronary atherosclerosis present. Trace pleural effusions. Mild bibasilar reticulonodular and peripheral tree in bud opacities worse on the left may represent mild bibasilar pneumonitis/pneumonia. Left lower lobe mild cylindrical bronchiectasis noted. Nonspecific nodular pleural thickening on the left. This is incompletely evaluated by abdominal imaging only. Hepatobiliary: Limited without IV contrast. Distended hepatic IVC and hepatic veins compatible with right heart failure. Peri portal edema suspected. No biliary obstruction pattern or large focal hepatic abnormality. Small layering calcified gallstones noted. Gallbladder nondistended. Common bile duct nondilated. Pancreas: Unremarkable. No pancreatic ductal dilatation or surrounding inflammatory changes. Spleen: Normal in size without focal abnormality. Adrenals/Urinary Tract: Nonspecific adrenal thickening bilaterally. Limited evaluation without contrast. No renal obstruction pattern. Renal vascular calcifications present. No hydroureter or ureteral calculus. No hydronephrosis. Posterior nonspecific bladder wall thickening. Stomach/Bowel: Negative for bowel obstruction, significant dilatation, ileus, or free air. Normal appendix. Small amount of upper abdomen perihepatic and perisplenic ascites. Small amount of dependent pelvic ascites. No focal fluid collection or abscess. Vascular/Lymphatic: Limited  without IV contrast. Abdominal atherosclerosis noted. Negative for aneurysm. No retroperitoneal hemorrhage or hematoma. No bulky adenopathy. Reproductive: No significant finding by CT. Other: Intact abdominal wall.  No hernia.  No inguinal abnormality. Musculoskeletal: Degenerative changes of the spine and facet joints. Old compression fracture at L4. Bones are osteopenic. IMPRESSION: Cardiomegaly. Trace pleural effusions and bibasilar reticulonodular/tree in bud mild opacities worse on the left. Difficult to exclude mild bibasilar bronchopneumonia. Small amount abdominopelvic ascites. Cholelithiasis Abdominal atherosclerosis No other acute intra-abdominal finding by noncontrast CT. Electronically Signed   By: Jerilynn Mages.  Shick M.D.   On: 07/24/2021 14:53   DG Chest 2 View  Result Date: 07/24/2021 CLINICAL DATA:  sob EXAM: CHEST - 2 VIEW COMPARISON:  July 05, 2017 FINDINGS: The cardiomediastinal silhouette is unchanged  in contour.Status post median sternotomy and CABG. LEFT chest AICD. Small LEFT pleural effusion. No pneumothorax. Mild diffuse interstitial prominence. LEFT retrocardiac opacities. Visualized abdomen is unremarkable. Mild degenerative changes of the thoracic spine. IMPRESSION: Constellation of findings are favored to reflect mild pulmonary edema with LEFT basilar atelectasis and small LEFT pleural effusion. Electronically Signed   By: Valentino Saxon M.D.   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  PLAN:    Anemia: Upon admission patient's hemoglobin was 7.9 and is trended down slightly to 7.3.  His most recent hemoglobin prior to admission in June 2022 was reported 10.2.  Coombs test is negative, patient does have a mildly elevated LDH and total bilirubin.  Haptoglobin is pending at time of dictation.  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  Etiology at this time remains unclear.  Although TTP remains in the differential,  this is more likely DIC.  Continue to monitor serial CBCs.  [id number] has been sent for completeness.   Thrombocytopenia: Patient's platelet count was 92 per admission has trended down slightly to 78.  Previous platelet count was within normal limits in March 2022.  Given his elevated D-dimer, PT, PTT this is suggestive of DIC.  Although fibrinogen is expected to be decreased, his is within normal limits.  It may be falsely elevated as an acute phase reactant.  Continue to monitor serial CBCs as above. Coagulopathy: Possibly underlying DIC.  Monitor. Hematuria: Noncontrast CT did not reveal any significant pathology. Renal insufficiency: Case discussed with nephrology.  Patient's creatinine has trended up to approximately 1.2 which is only mildly elevated from his baseline of 1.4.  Possibly secondary to intravascular dehydration from third spacing of fluids.  Continue to monitor. CHF exacerbation, fluid overload: Patient currently on Lasix drip.  Appreciate consult, will follow.    Lloyd Huger, MD   07/24/2021 8:27 PM

## 2021-07-24 NOTE — ED Notes (Signed)
Pt resting in bed asleep on the monitor at this time.

## 2021-07-24 NOTE — ED Provider Notes (Signed)
[facility] Emergency Department Provider Note    Event Date/Time   First MD Initiated Contact with Patient 07/24/21 1138     (approximate)  I have reviewed the triage vital signs and the nursing notes.   HISTORY  Chief Complaint Shortness of Breath    HPI Walter Blair is a 75 y.o. male with below listed past medical history was sent over from cardiology clinic due to lower extremity swelling and worsening kidney function despite outpatient diuresis.  He denies any chest pain has been having worsening shortness of breath.  No measured fevers.  No confusion.  States he has had some blood in his urine.  Past Medical History:  Diagnosis Date   CAD (coronary artery disease)    Diabetes ([place])    H/O: stroke    HTN (hypertension)    Family History  Problem Relation Age of Onset   Diabetes Brother    Hypertension Mother    Diabetes Sister    Past Surgical History:  Procedure Laterality Date   CARDIAC DEFIBRILLATOR PLACEMENT     CARDIOVERSION N/A 07/01/2017   Procedure: CARDIOVERSION;  Surgeon: Dionisio David, MD;  Location: [facility];  Service: Cardiovascular;  Laterality: N/A;   CARDIOVERSION N/A 07/05/2017   Procedure: CARDIOVERSION;  Surgeon: Dionisio David, MD;  Location: [facility];  Service: Cardiovascular;  Laterality: N/A;   COLONOSCOPY WITH PROPOFOL N/A 12/30/2020   Procedure: COLONOSCOPY WITH PROPOFOL;  Surgeon: [name], MD;  Location: [facility] END[facility]COPY;  Service: Gastroenterology;  Laterality: N/A;   CORONARY ARTERY BYPASS GRAFT     ESOPHAGOGASTRODUODENOSCOPY  12/30/2020   Procedure: ESOPHAGOGASTRODUODENOSCOPY (EGD);  Surgeon: [name], MD;  Location: [facility] ENDOSCOPY;  Service: Gastroenterology;;   GIVENS CAPSULE STUDY N/A 02/02/2021   Procedure: GIVENS CAPSULE STUDY;  Surgeon: [name], MD;  Location: [facility] ENDOSCOPY;  Service: Gastroenterology;  Laterality: N/A;  WILL NEED INTERPRETER ON WHEELS;  BROTHER WANTS TO TRANSLATE   TEE  WITHOUT CARDIOVERSION N/A 07/01/2017   Procedure: TRANSESOPHAGEAL ECHOCARDIOGRAM (TEE);  Surgeon: Dionisio David, MD;  Location: [facility];  Service: Cardiovascular;  Laterality: N/A;   TEE WITHOUT CARDIOVERSION N/A 07/05/2017   Procedure: TRANSESOPHAGEAL ECHOCARDIOGRAM (TEE);  Surgeon: Dionisio David, MD;  Location: [facility];  Service: Cardiovascular;  Laterality: N/A;   Patient Active Problem List   Diagnosis Date Noted   Acute on chronic systolic CHF (congestive heart failure) ([place]) 07/24/2021   Atrial fibrillation, chronic ([place]) 07/24/2021   H/O: stroke    Hematuria    Acute renal failure superimposed on stage 3a chronic kidney disease (HCC)    Thrombocytopenia (HCC)    HLD (hyperlipidemia)    Iron deficiency anemia 01/27/2021   Mixed hyperlipidemia 02/06/2020   Grief 07/04/2017   Dizziness    SOB (shortness of breath)    Encounter for central line placement    New onset atrial fibrillation ([place]) 06/27/2017   Diabetes ([place]) 06/27/2017   HTN (hypertension) 06/27/2017   CAD (coronary artery disease) 06/27/2017      Prior to Admission medications   Medication Sig Start Date End Date Taking? Authorizing Provider  amiodarone (PACERONE) 200 MG tablet One tablet twice a day for one week then one tablet daily 07/06/17   Loletha Grayer, MD  atorvastatin (LIPITOR) 80 MG tablet Take 80 mg by mouth daily. 10/03/20   [provider]  digoxin (LANOXIN) 0.125 MG tablet Take 1 tablet (0.125 mg total) by mouth daily. 07/07/17   Loletha Grayer, MD  ferrous sulfate Marcy Siren)  325 (65 FE) MG tablet Take 1 tablet twice a day every other day for 90 days. 12/18/20   [name], MD  folic acid (FOLVITE) 1 MG tablet Take 1 mg by mouth every morning.    [provider]  glipiZIDE (GLUCOTROL XL) 2.5 MG 24 hr tablet Take 2.5 mg by mouth daily. 12/09/20   [provider]  levothyroxine (SYNTHROID) 50 MCG tablet Start one tablet per day. After two weeks, adjust to two tablets  per day. Take in the morning on an empty stomach and wait 20 min before eating. 07/13/21   [provider]  metFORMIN (GLUCOPHAGE-XR) 500 MG 24 hr tablet Take 500-1,000 mg by mouth 2 (two) times daily. 500mg  in the morning and 1000mg  at night    [provider]  [facility] ULTRA test strip daily. 04/21/21   [provider]  pantoprazole (PROTONIX) 20 MG tablet Take 1 tablet by mouth daily.    [provider]  polyethylene glycol (MIRALAX / GLYCOLAX) packet Take 17 g by mouth daily as needed. 07/06/17   Loletha Grayer, MD  sacubitril-valsartan (ENTRESTO) 24-26 MG Take 1 tablet by mouth 2 (two) times daily. 07/06/17   Loletha Grayer, MD  spironolactone (ALDACTONE) 25 MG tablet Take 0.5 tablets (12.5 mg total) by mouth daily. 07/06/17   Loletha Grayer, MD  torsemide (DEMADEX) 20 MG tablet Take 20 mg by mouth 2 (two) times daily. 07/13/21   [provider]  Vericiguat 5 MG TABS Take by mouth.    [provider]  XARELTO 15 MG TABS tablet Take 15 mg by mouth every morning. 04/30/21   [provider]    Allergies Patient has no known allergies.    Social History Social History   Tobacco Use   Smoking status: Former    Types: Cigarettes   Smokeless tobacco: Former    Types: Chew  Substance Use Topics   Alcohol use: No   Drug use: No    Review of Systems Patient denies headaches, rhinorrhea, blurry vision, numbness, shortness of breath, chest pain, edema, cough, abdominal pain, nausea, vomiting, diarrhea, dysuria, fevers, rashes or hallucinations unless otherwise stated above in HPI. ____________________________________________   PHYSICAL EXAM:  VITAL SIGNS: Vitals:   07/24/21 1015 07/24/21 1305  BP: (!) 89/70 101/64  Pulse: 62 (!) 58  Resp: 20 20  Temp: 97.9 F (36.6 C)   SpO2: 100% 98%    Constitutional: Alert and oriented.  Eyes: Conjunctivae are normal.  Head: Atraumatic. Nose: No  congestion/rhinnorhea. Mouth/Throat: Mucous membranes are moist.   Neck: No stridor. Painless ROM.  Cardiovascular: Normal rate, regular rhythm. Grossly normal heart sounds.  Good peripheral circulation. Respiratory: Normal respiratory effort.  No retractions. Lungs CTAB. Gastrointestinal: Soft and nontender. No distention. No abdominal bruits. No CVA tenderness. Genitourinary:  Musculoskeletal: No lower extremity tenderness . 2+ BLE edema.  No joint effusions. Neurologic:  Normal speech and language. No gross focal neurologic deficits are appreciated. No facial droop Skin:  Skin is warm, dry and intact. No rash noted. Psychiatric: Mood and affect are normal. Speech and behavior are normal.  ____________________________________________   LABS (all labs ordered are listed, but only abnormal results are displayed)  Results for orders placed or performed during the hospital encounter of 07/24/21 (from the past 24 hour(s))  CBC     Status: Abnormal   Collection Time: 07/24/21 10:11 AM  Result Value Ref Range   WBC 4.1 4.0 - 10.5 K/uL   RBC 2.66 (L) 4.22 -  5.81 MIL/uL   Hemoglobin 7.9 (L) 13.0 - 17.0 g/dL   HCT 24.6 (L) 39.0 - 52.0 %   MCV 92.5 80.0 - 100.0 fL   MCH 29.7 26.0 - 34.0 pg   MCHC 32.1 30.0 - 36.0 g/dL   RDW 16.3 (H) 11.5 - 15.5 %   Platelets 92 (L) 150 - 400 K/uL   nRBC 0.0 0.0 - 0.2 %  Basic metabolic panel     Status: Abnormal   Collection Time: 07/24/21 10:11 AM  Result Value Ref Range   Sodium 134 (L) 135 - 145 mmol/L   Potassium 3.9 3.5 - 5.1 mmol/L   Chloride 100 98 - 111 mmol/L   CO2 24 22 - 32 mmol/L   Glucose, Bld 193 (H) 70 - 99 mg/dL   BUN 40 (H) 8 - 23 mg/dL   Creatinine, Ser 2.19 (H) 0.61 - 1.24 mg/dL   Calcium 8.6 (L) 8.9 - 10.3 mg/dL   GFR, Estimated 30 (L) >60 mL/min   Anion gap 10 5 - 15  Brain natriuretic peptide     Status: Abnormal   Collection Time: 07/24/21 10:11 AM  Result Value Ref Range   B Natriuretic Peptide 3,305.3 (H) 0.0 - 100.0  pg/mL  Pathologist smear review     Status: None   Collection Time: 07/24/21 10:11 AM  Result Value Ref Range   Path Review Blood smear is reviewed.   D-dimer, quantitative     Status: Abnormal   Collection Time: 07/24/21 10:11 AM  Result Value Ref Range   D-Dimer, Quant 3.38 (H) 0.00 - 0.50 ug/mL-FEU  Fibrinogen     Status: None   Collection Time: 07/24/21 10:11 AM  Result Value Ref Range   Fibrinogen 308 210 - 475 mg/dL  Lactate dehydrogenase     Status: Abnormal   Collection Time: 07/24/21 10:11 AM  Result Value Ref Range   LDH 215 (H) 98 - 192 U/L  Hepatic function panel     Status: Abnormal   Collection Time: 07/24/21 10:11 AM  Result Value Ref Range   Total Protein 6.7 6.5 - 8.1 g/dL   Albumin 3.7 3.5 - 5.0 g/dL   AST 29 15 - 41 U/L   ALT 13 0 - 44 U/L   Alkaline Phosphatase 49 38 - 126 U/L   Total Bilirubin 1.4 (H) 0.3 - 1.2 mg/dL   Bilirubin, Direct 0.3 (H) 0.0 - 0.2 mg/dL   Indirect Bilirubin 1.1 (H) 0.3 - 0.9 mg/dL  Protime-INR     Status: Abnormal   Collection Time: 07/24/21 12:30 PM  Result Value Ref Range   Prothrombin Time 34.5 (H) 11.4 - 15.2 seconds   INR 3.4 (H) 0.8 - 1.2  Reticulocytes     Status: Abnormal   Collection Time: 07/24/21 12:30 PM  Result Value Ref Range   Retic Ct Pct 1.4 0.4 - 3.1 %   RBC. 2.50 (L) 4.22 - 5.81 MIL/uL   Retic Count, Absolute 35.0 19.0 - 186.0 K/uL   Immature Retic Fract 11.8 2.3 - 15.9 %  APTT     Status: Abnormal   Collection Time: 07/24/21 12:30 PM  Result Value Ref Range   aPTT 72 (H) 24 - 36 seconds  Resp Panel by RT-PCR (Flu A&B, Covid) Nasopharyngeal Swab     Status: None   Collection Time: 07/24/21 12:32 PM   Specimen: Nasopharyngeal Swab; Nasopharyngeal(NP) swabs in vial transport medium  Result Value Ref Range   SARS Coronavirus 2 by RT PCR  NEGATIVE NEGATIVE   Influenza A by PCR NEGATIVE NEGATIVE   Influenza B by PCR NEGATIVE NEGATIVE  Urinalysis, Complete w Microscopic     Status: Abnormal   Collection Time:  07/24/21  1:08 PM  Result Value Ref Range   Color, Urine RED (A) YELLOW   APPearance TURBID (A) CLEAR   Specific Gravity, Urine 1.010 1.005 - 1.030   pH  5.0 - 8.0    TEST NOT REPORTED DUE TO COLOR INTERFERENCE OF URINE PIGMENT   Glucose, UA (A) NEGATIVE mg/dL    TEST NOT REPORTED DUE TO COLOR INTERFERENCE OF URINE PIGMENT   Hgb urine dipstick (A) NEGATIVE    TEST NOT REPORTED DUE TO COLOR INTERFERENCE OF URINE PIGMENT   Bilirubin Urine (A) NEGATIVE    TEST NOT REPORTED DUE TO COLOR INTERFERENCE OF URINE PIGMENT   Ketones, ur (A) NEGATIVE mg/dL    TEST NOT REPORTED DUE TO COLOR INTERFERENCE OF URINE PIGMENT   Protein, ur (A) NEGATIVE mg/dL    TEST NOT REPORTED DUE TO COLOR INTERFERENCE OF URINE PIGMENT   Nitrite (A) NEGATIVE    TEST NOT REPORTED DUE TO COLOR INTERFERENCE OF URINE PIGMENT   Leukocytes,Ua (A) NEGATIVE    TEST NOT REPORTED DUE TO COLOR INTERFERENCE OF URINE PIGMENT   RBC / HPF >50 (H) 0 - 5 RBC/hpf   WBC, UA 6-10 0 - 5 WBC/hpf   Bacteria, UA FEW (A) NONE SEEN   Squamous Epithelial / LPF NONE SEEN 0 - 5   Non Squamous Epithelial PRESENT (A) NONE SEEN   ____________________________________________  EKG My review and personal interpretation at Time: 10:10   Indication: sob  Rate: 60  Rhythm: vpaced Axis: left Other: abnml ekg ____________________________________________  RADIOLOGY  I personally reviewed all radiographic images ordered to evaluate for the above acute complaints and reviewed radiology reports and findings.  These findings were personally discussed with the patient.  Please see medical record for radiology report.  ____________________________________________   PROCEDURES  Procedure(s) performed:  Procedures    Critical Care performed: no ____________________________________________   INITIAL IMPRESSION / ASSESSMENT AND PLAN / ED COURSE  Pertinent labs & imaging results that were available during my care of the patient were reviewed by  me and considered in my medical decision making (see chart for details).   DDX: chf, aki, ttp, dic, sepsis, mass, medication effect  Walter Blair is a 75 y.o. who presents to the ED with presentation as described above.  Patient clinically very well appearing does have lower extremity swelling edema as well as findings consistent with CHF blood work showing evidence of AKI which appears to be failing outpatient diuresis he is not any respiratory distress no hypoxia.  He is complaining of exertional dyspnea and weakness.  Has been also having hematuria.  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  We will add on hemolysis work-up.  Clinical Course as of 07/24/21 1424  Fri Jul 24, 2021  1237 Bilirubin normal given lack of fever or other clinical findings to suggest TTP seems less likely.  DIC also on differential but no white count or source of infection or trauma as precipitating event.  I have consulted Dr. Grayland Ormond of hematology oncology who will evaluate patient at bedside. [PR]  1310 's PT/INR and PTT are both elevated this is more suggestive of DIC than TTP.  He clinically appears well does complain  of shortness of breath and weakness and does have low blood pressures currently in a hall bed due to capacity issues we are working on getting him a room so we can more closely monitor his hemodynamics.  He will require hospitalization certainly.  Will order CT abdomen to evaluate for any evidence of mass as a precipitating event. [PR]  1311 Fibrinogen: 308 [PR]  1356 Patient evaluated by Dr. Grayland Ormond at bedside and agrees with plan for admission to the facility given his clinical well appearance at this time.  Etiology of his presentation still remains elusive.  I have ordered CT imaging due to his history of hematuria possible urological malignancy.  Will discuss with hospitalist  for admission. [PR]    Clinical Course User Index [PR] Merlyn Lot, MD    The patient was evaluated in Emergency Department today for the symptoms described in the history of present illness. He/she was evaluated in the context of the global COVID-19 pandemic, which necessitated consideration that the patient might be at risk for infection with the SARS-CoV-2 virus that causes COVID-19. Institutional protocols and algorithms that pertain to the evaluation of patients at risk for COVID-19 are in a state of rapid change based on information released by regulatory bodies including the CDC and federal and state organizations. These policies and algorithms were followed during the patient's care in the ED.  As part of my medical decision making, I reviewed the following data within the [place] notes reviewed and incorporated, Labs reviewed, notes from prior ED visits and [place] Controlled Substance Database   ____________________________________________   FINAL CLINICAL IMPRESSION(S) / ED DIAGNOSES  Final diagnoses:  Shortness of breath  Acute on chronic congestive heart failure, unspecified heart failure type ([place])      NEW MEDICATIONS STARTED DURING THIS VISIT:  New Prescriptions   No medications on file     Note:  This document was prepared using Dragon voice recognition software and may include unintentional dictation errors.    Merlyn Lot, MD 07/24/21 [phone]

## 2021-07-24 NOTE — ED Notes (Signed)
Unable to check pt CBG at this time due to not being able to find a glucometer in the unit. Will assess CBG when glucometer is found

## 2021-07-24 NOTE — ED Notes (Signed)
Pt visitor endorsing that pt was taking Verquvo 5mg  BID 2 weeks prior.

## 2021-07-24 NOTE — ED Notes (Signed)
Bladder scan 300

## 2021-07-24 NOTE — H&P (Addendum)
History and Physical    Walter Blair ZOX:[id number] DOB: January 13, 1946 DOA: 07/24/2021  Referring MD/NP/PA:   PCP: Jodi Marble, MD   Patient coming from:  The patient is coming from home.  At baseline, pt is independent for most of ADL.        Chief Complaint: SOB  HPI: Walter Blair is a 75 y.o. male with medical history significant of HTN, HLD, DM, stroke, hypothyroidism, CAD, atrial fibrillation on Xarelto, anemia, CHF with EF of 15%, CKD-3A, pacemaker placement, hematuria (currently being worked up by urologist), who presents with shortness of breath.  Patient states that he has shortness of breath for more than 2 weeks, which has been progressively worsening.  Patient has mild chronic dry cough, which has not changed.  No chest pain.  No fever or chills.  Patient also has worsening bilateral leg edema.  Patient had intermittent mild diarrhea recently, which has resolved.  Currently no nausea, vomiting, diarrhea or abdominal pain.  Patient has hematuria which is currently being worked up by Dr. Al Pimple of urology. They are planning to do CT urogram and cystoscopy, but has not done yet.  Patient states that he still has intermittent mild hematuria.  He is still taking Xarelto, last dose was this morning.  He denies dysuria or burning with urination. Pt was seen by Dr. Humphrey Rolls of card in clinic who sent patient to ED for further evaluation and treatment  ED Course: pt was found to have WBC 4.1, hemoglobin 7.9 (9.6 on 12/16/2020), platelet 92, INR 3.4, PTT 72, BNP 3305, negative COVID PCR, D-dimer 3.38, worsening renal function, temperature normal, soft blood pressure 89/70, 101/64, heart rate 58, RR 20, oxygen saturation 98% on room air.  Chest x-ray showed mild pulmonary edema.  CT of abdomen/pelvis is negative for acute intra abdominal issues.  Patient is admitted to progressive bed as inpatient.  Dr. Humphrey Rolls of card is consulted.  CT-abd/pelvis: Cardiomegaly.   Trace pleural effusions and  bibasilar reticulonodular/tree in bud mild opacities worse on the left. Difficult to exclude mild bibasilar bronchopneumonia.   Small amount abdominopelvic ascites.   Cholelithiasis   Abdominal atherosclerosis   No other acute intra-abdominal finding by noncontrast CT  Review of Systems:   General: no fevers, chills, no body weight gain, has fatigue HEENT: no blurry vision, hearing changes or sore throat Respiratory: has dyspnea, coughing, no wheezing CV: no chest pain, no palpitations GI: no nausea, vomiting, abdominal pain, diarrhea, constipation GU: no dysuria, burning on urination, increased urinary frequency, has hematuria  Ext: has leg edema Neuro: no unilateral weakness, numbness, or tingling, no vision change or hearing loss Skin: no rash, no skin tear. MSK: No muscle spasm, no deformity, no limitation of range of movement in spin Heme: No easy bruising.  Travel history: No recent long distant travel.  Allergy: No Known Allergies  Past Medical History:  Diagnosis Date   CAD (coronary artery disease)    Diabetes ([place])    H/O: stroke    HTN (hypertension)     Past Surgical History:  Procedure Laterality Date   CARDIAC DEFIBRILLATOR PLACEMENT     CARDIOVERSION N/A 07/01/2017   Procedure: CARDIOVERSION;  Surgeon: Dionisio David, MD;  Location: [facility];  Service: Cardiovascular;  Laterality: N/A;   CARDIOVERSION N/A 07/05/2017   Procedure: CARDIOVERSION;  Surgeon: Dionisio David, MD;  Location: [facility];  Service: Cardiovascular;  Laterality: N/A;   COLONOSCOPY WITH PROPOFOL N/A 12/30/2020   Procedure: COLONOSCOPY WITH PROPOFOL;  Surgeon:  [name], MD;  Location: [facility];  Service: Gastroenterology;  Laterality: N/A;   CORONARY ARTERY BYPASS GRAFT     ESOPHAGOGASTRODUODENOSCOPY  12/30/2020   Procedure: ESOPHAGOGASTRODUODENOSCOPY (EGD);  Surgeon: [name], MD;  Location: [facility] ENDOSCOPY;  Service: Gastroenterology;;   GIVENS CAPSULE STUDY N/A 02/02/2021    Procedure: GIVENS CAPSULE STUDY;  Surgeon: [name], MD;  Location: [facility]OSCOPY;  Service: Gastroenterology;  Laterality: N/A;  WILL NEED INTERPRETER ON WHEELS;  BROTHER WANTS TO TRANSLATE   TEE WITHOUT CARDIOVERSION N/A 07/01/2017   Procedure: TRANSESOPHAGEAL ECHOCARDIOGRAM (TEE);  Surgeon: Dionisio David, MD;  Location: [facility];  Service: Cardiovascular;  Laterality: N/A;   TEE WITHOUT CARDIOVERSION N/A 07/05/2017   Procedure: TRANSESOPHAGEAL ECHOCARDIOGRAM (TEE);  Surgeon: Dionisio David, MD;  Location: [facility];  Service: Cardiovascular;  Laterality: N/A;    Social History:  reports that he has quit smoking. His smoking use included cigarettes. He has quit using smokeless tobacco.  His smokeless tobacco use included chew. He reports that he does not drink alcohol and does not use drugs.  Family History:  Family History  Problem Relation Age of Onset   Diabetes Brother    Hypertension Mother    Diabetes Sister      Prior to Admission medications   Medication Sig Start Date End Date Taking? Authorizing Provider  amiodarone (PACERONE) 200 MG tablet One tablet twice a day for one week then one tablet daily 07/06/17   Loletha Grayer, MD  atorvastatin (LIPITOR) 80 MG tablet Take 80 mg by mouth daily. 10/03/20   [provider]  digoxin (LANOXIN) 0.125 MG tablet Take 1 tablet (0.125 mg total) by mouth daily. 07/07/17   Loletha Grayer, MD  ferrous sulfate (FERROUSUL) 325 (65 FE) MG tablet Take 1 tablet twice a day every other day for 90 days. 12/18/20   [name], MD  folic acid (FOLVITE) 1 MG tablet Take 1 mg by mouth every morning.    [provider]  glipiZIDE (GLUCOTROL XL) 2.5 MG 24 hr tablet Take 2.5 mg by mouth daily. 12/09/20   [provider]  levothyroxine (SYNTHROID) 50 MCG tablet Start one tablet per day. After two weeks, adjust to two tablets per day. Take in the morning on an empty stomach and wait 20 min before eating. 07/13/21   [provider]  metFORMIN (GLUCOPHAGE-XR) 500 MG 24 hr tablet Take 500-1,000 mg by mouth 2 (two) times daily. 500mg  in the morning and 1000mg  at night    [provider]  [facility] ULTRA test strip daily. 04/21/21   [provider]  pantoprazole (PROTONIX) 20 MG tablet Take 1 tablet by mouth daily.    [provider]  polyethylene glycol (MIRALAX / GLYCOLAX) packet Take 17 g by mouth daily as needed. 07/06/17   Loletha Grayer, MD  sacubitril-valsartan (ENTRESTO) 24-26 MG Take 1 tablet by mouth 2 (two) times daily. 07/06/17   Loletha Grayer, MD  spironolactone (ALDACTONE) 25 MG tablet Take 0.5 tablets (12.5 mg total) by mouth daily. 07/06/17   Loletha Grayer, MD  torsemide (DEMADEX) 20 MG tablet Take 20 mg by mouth 2 (two) times daily. 07/13/21   [provider]  Vericiguat 5 MG TABS Take by mouth.    [provider]  XARELTO 15 MG TABS tablet Take 15 mg by mouth every morning. 04/30/21   [provider]    Physical Exam: Vitals:   07/24/21 1330 07/24/21 1430 07/24/21 1500 07/24/21 1530  BP: 126/78 108/66 91/65  107/60  Pulse:      Resp: 16 18 12 17   Temp:      TempSrc:      SpO2:      Weight:      Height:       General: Not in acute distress HEENT:       Eyes: PERRL, EOMI, no scleral icterus.       ENT: No discharge from the ears and nose, no pharynx injection, no tonsillar enlargement.        Neck: positive JVD, no bruit, no mass felt. Heme: No neck lymph node enlargement. Cardiac: S1/S2, RRR, No murmurs, No gallops or rubs. Respiratory: has crackles bilaterally GI: Soft, nondistended, nontender, no rebound pain, no organomegaly, BS present. GU:  has hematuria Ext: 3+ pitting leg edema bilaterally. 1+DP/PT pulse bilaterally. Musculoskeletal: No joint deformities, No joint redness or warmth, no limitation of ROM in spin. Skin: No rashes.  Neuro: Alert, oriented X3, cranial nerves II-XII grossly intact, moves all extremities  normally.  Psych: Patient is not psychotic, no suicidal or hemocidal ideation.  Labs on Admission: I have personally reviewed following labs and imaging studies  CBC: Recent Labs  Lab 07/24/21 1011  WBC 4.1  HGB 7.9*  HCT 24.6*  MCV 92.5  PLT 92*   Basic Metabolic Panel: Recent Labs  Lab 07/24/21 1011  NA 134*  K 3.9  CL 100  CO2 24  GLUCOSE 193*  BUN 40*  CREATININE 2.19*  CALCIUM 8.6*   GFR: Estimated Creatinine Clearance: 25.9 mL/min (A) (by C-G formula based on SCr of 2.19 mg/dL (H)). Liver Function Tests: Recent Labs  Lab 07/24/21 1011  AST 29  ALT 13  ALKPHOS 49  BILITOT 1.4*  PROT 6.7  ALBUMIN 3.7   No results for input(s): LIPASE, AMYLASE in the last 168 hours. No results for input(s): AMMONIA in the last 168 hours. Coagulation Profile: Recent Labs  Lab 07/24/21 1230  INR 3.4*   Cardiac Enzymes: No results for input(s): CKTOTAL, CKMB, CKMBINDEX, TROPONINI in the last 168 hours. BNP (last 3 results) No results for input(s): PROBNP in the last 8760 hours. HbA1C: No results for input(s): HGBA1C in the last 72 hours. CBG: No results for input(s): GLUCAP in the last 168 hours. Lipid Profile: No results for input(s): CHOL, HDL, LDLCALC, TRIG, CHOLHDL, LDLDIRECT in the last 72 hours. Thyroid Function Tests: No results for input(s): TSH, T4TOTAL, FREET4, T3FREE, THYROIDAB in the last 72 hours. Anemia Panel: Recent Labs    07/24/21 1230  RETICCTPCT 1.4   Urine analysis:    Component Value Date/Time   COLORURINE RED (A) 07/24/2021 1308   APPEARANCEUR TURBID (A) 07/24/2021 1308   APPEARANCEUR Cloudy (A) 07/16/2021 0846   LABSPEC 1.010 07/24/2021 1308   PHURINE  07/24/2021 1308    TEST NOT REPORTED DUE TO COLOR INTERFERENCE OF URINE PIGMENT   GLUCOSEU (A) 07/24/2021 1308    TEST NOT REPORTED DUE TO COLOR INTERFERENCE OF URINE PIGMENT   HGBUR (A) 07/24/2021 1308    TEST NOT REPORTED DUE TO COLOR INTERFERENCE OF URINE PIGMENT   BILIRUBINUR (A)  07/24/2021 1308    TEST NOT REPORTED DUE TO COLOR INTERFERENCE OF URINE PIGMENT   BILIRUBINUR Negative 07/16/2021 0846   KETONESUR (A) 07/24/2021 1308    TEST NOT REPORTED DUE TO COLOR INTERFERENCE OF URINE PIGMENT   PROTEINUR (A) 07/24/2021 1308    TEST NOT REPORTED DUE TO COLOR INTERFERENCE OF URINE PIGMENT   NITRITE (A) 07/24/2021 1308  TEST NOT REPORTED DUE TO COLOR INTERFERENCE OF URINE PIGMENT   LEUKOCYTESUR (A) 07/24/2021 1308    TEST NOT REPORTED DUE TO COLOR INTERFERENCE OF URINE PIGMENT   Sepsis Labs: @LABRCNTIP (procalcitonin:4,lacticidven:4) ) Recent Results (from the past 240 hour(s))  Microscopic Examination     Status: Abnormal   Collection Time: 07/16/21  8:46 AM   Urine  Result Value Ref Range Status   WBC, UA 0-5 0 - 5 /hpf Final   RBC >30 (A) 0 - 2 /hpf Final   Epithelial Cells (non renal) 0-10 0 - 10 /hpf Final   Casts Present (A) None seen /lpf Final   Cast Type Hyaline casts N/A Final   Bacteria, UA Few None seen/Few Final  Resp Panel by RT-PCR (Flu A&B, Covid) Nasopharyngeal Swab     Status: None   Collection Time: 07/24/21 12:32 PM   Specimen: Nasopharyngeal Swab; Nasopharyngeal(NP) swabs in vial transport medium  Result Value Ref Range Status   SARS Coronavirus 2 by RT PCR NEGATIVE NEGATIVE Final    Comment: (NOTE) SARS-CoV-2 target nucleic acids are NOT DETECTED.  The SARS-CoV-2 RNA is generally detectable in upper respiratory specimens during the acute phase of infection. The lowest concentration of SARS-CoV-2 viral copies this assay can detect is 138 copies/mL. A negative result does not preclude SARS-Cov-2 infection and should not be used as the sole basis for treatment or other patient management decisions. A negative result may occur with  improper specimen collection/handling, submission of specimen other than nasopharyngeal swab, presence of viral mutation(s) within the areas targeted by this assay, and inadequate number of  viral copies(<138 copies/mL). A negative result must be combined with clinical observations, patient history, and epidemiological information. The expected result is Negative.  Fact Sheet for Patients:  EntrepreneurPulse.com.au  Fact Sheet for Healthcare Providers:  IncredibleEmployment.be  This test is no t yet approved or cleared by the [place] FDA and  has been authorized for detection and/or diagnosis of SARS-CoV-2 by FDA under an Emergency Use Authorization (EUA). This EUA will remain  in effect (meaning this test can be used) for the duration of the COVID-19 declaration under Section 564(b)(1) of the Act, 21 U.S.C.section 360bbb-3(b)(1), unless the authorization is terminated  or revoked sooner.       Influenza A by PCR NEGATIVE NEGATIVE Final   Influenza B by PCR NEGATIVE NEGATIVE Final    Comment: (NOTE) The Xpert Xpress SARS-CoV-2/FLU/RSV plus assay is intended as an aid in the diagnosis of influenza from Nasopharyngeal swab specimens and should not be used as a sole basis for treatment. Nasal washings and aspirates are unacceptable for Xpert Xpress SARS-CoV-2/FLU/RSV testing.  Fact Sheet for Patients: EntrepreneurPulse.com.au  Fact Sheet for Healthcare Providers: IncredibleEmployment.be  This test is not yet approved or cleared by the [place] FDA and has been authorized for detection and/or diagnosis of SARS-CoV-2 by FDA under an Emergency Use Authorization (EUA). This EUA will remain in effect (meaning this test can be used) for the duration of the COVID-19 declaration under Section 564(b)(1) of the Act, 21 U.S.C. section 360bbb-3(b)(1), unless the authorization is terminated or revoked.  Performed at [facility], [address] [place], [place] [address]      Radiological Exams on Admission: CT ABDOMEN PELVIS WO CONTRAST  Result Date: 07/24/2021 CLINICAL DATA:   Abdominal distension, shortness of breath, retained fluid EXAM: CT ABDOMEN AND PELVIS WITHOUT CONTRAST TECHNIQUE: Multidetector CT imaging of the abdomen and pelvis was performed following the standard protocol without  IV contrast. COMPARISON:  None. FINDINGS: Lower chest: Cardiomegaly without pericardial effusion. Pacer wires in the right heart. Previous median sternotomy. Native coronary atherosclerosis present. Trace pleural effusions. Mild bibasilar reticulonodular and peripheral tree in bud opacities worse on the left may represent mild bibasilar pneumonitis/pneumonia. Left lower lobe mild cylindrical bronchiectasis noted. Nonspecific nodular pleural thickening on the left. This is incompletely evaluated by abdominal imaging only. Hepatobiliary: Limited without IV contrast. Distended hepatic IVC and hepatic veins compatible with right heart failure. Peri portal edema suspected. No biliary obstruction pattern or large focal hepatic abnormality. Small layering calcified gallstones noted. Gallbladder nondistended. Common bile duct nondilated. Pancreas: Unremarkable. No pancreatic ductal dilatation or surrounding inflammatory changes. Spleen: Normal in size without focal abnormality. Adrenals/Urinary Tract: Nonspecific adrenal thickening bilaterally. Limited evaluation without contrast. No renal obstruction pattern. Renal vascular calcifications present. No hydroureter or ureteral calculus. No hydronephrosis. Posterior nonspecific bladder wall thickening. Stomach/Bowel: Negative for bowel obstruction, significant dilatation, ileus, or free air. Normal appendix. Small amount of upper abdomen perihepatic and perisplenic ascites. Small amount of dependent pelvic ascites. No focal fluid collection or abscess. Vascular/Lymphatic: Limited without IV contrast. Abdominal atherosclerosis noted. Negative for aneurysm. No retroperitoneal hemorrhage or hematoma. No bulky adenopathy. Reproductive: No significant finding by CT.  Other: Intact abdominal wall.  No hernia.  No inguinal abnormality. Musculoskeletal: Degenerative changes of the spine and facet joints. Old compression fracture at L4. Bones are osteopenic. IMPRESSION: Cardiomegaly. Trace pleural effusions and bibasilar reticulonodular/tree in bud mild opacities worse on the left. Difficult to exclude mild bibasilar bronchopneumonia. Small amount abdominopelvic ascites. Cholelithiasis Abdominal atherosclerosis No other acute intra-abdominal finding by noncontrast CT. Electronically Signed   By: Jerilynn Mages.  Shick M.D.   On: 07/24/2021 14:53   DG Chest 2 View  Result Date: 07/24/2021 CLINICAL DATA:  sob EXAM: CHEST - 2 VIEW COMPARISON:  July 05, 2017 FINDINGS: The cardiomediastinal silhouette is unchanged in contour.Status post median sternotomy and CABG. LEFT chest AICD. Small LEFT pleural effusion. No pneumothorax. Mild diffuse interstitial prominence. LEFT retrocardiac opacities. Visualized abdomen is unremarkable. Mild degenerative changes of the thoracic spine. IMPRESSION: Constellation of findings are favored to reflect mild pulmonary edema with LEFT basilar atelectasis and small LEFT pleural effusion. Electronically Signed   By: Valentino Saxon M.D.   On: 07/24/2021 10:48     EKG: I have personally reviewed.  Sinus rhythm, QTC 539, paced rhythm   Assessment/Plan Principal Problem:   Acute on chronic systolic CHF (congestive heart failure) (HCC) Active Problems:   HTN (hypertension)   CAD (coronary artery disease)   Iron deficiency anemia   Atrial fibrillation, chronic (HCC)   H/O: stroke   Hematuria   Acute renal failure superimposed on stage 3a chronic kidney disease (HCC)   Thrombocytopenia (HCC)   HLD (hyperlipidemia)   Type II diabetes mellitus with renal manifestations (HCC)   Hypothyroid   Acute on chronic systolic CHF (congestive heart failure) ([place]): 2D echo on 06/29/2017 showed EF of 15%.  Patient has 3+ leg edema, positive JVD, crackles on  auscultation, chest x-ray showed pulmonary edema, elevated BNP 3305, clinically consistent with CHF exacerbation.  Patient's blood pressures are soft, which seem to be a chronic issue.  Consulted his cardiologist, Dr. Humphrey Rolls who recommended to hold Entresto, start Lasix drip and get renal consultation due to worsening renal function.  If Lasix drip is not working, may consider milrinone drip.  -Will admit to progressive unit as inpatient -Lasix  gtt at 8 mg/hour -will hold Verquvo and Entresto due to  soft Bp -2d echo -Daily weights -strict I/O's -Low salt diet -Fluid restriction -Obtain REDs Vest reading  HTN (hypertension): Blood pressures are soft -Monitor blood pressure closely -Patient is on IV Lasix drip  CAD (coronary artery disease): no CP -Continue Lipitor  Atrial fibrillation, chronic ([place]): Heart rate 58.  Patient is taking Xarelto.  Patient has hematuria, hemoglobin dropped from 9.6 to 7.9, but no massive bleeding.  Due to EF of 15, patient is at high risk of developing transmural thrombus.  Will continue Xarelto and monitor hemoglobin closely, by checking CBC every 6 hours -Continue amiodarone -Continue Xarelto  Iron deficiency anemia: Patient is not taking iron supplement -Will restart ferrous sulfate  H/O: stroke -Patient is on Xarelto and Lipitor  Hematuria: Patient is following up with Dr. Diamantina Providence, planning to do CT urogram and cystoscopy.  CT scan of abdomen/pelvis today is negative for acute findings. -Follow-up with urologist -check CBC q6h  Acute renal failure superimposed on stage 3a chronic kidney disease ([place]): Baseline creatinine 1.0-1.2.  His creatinine is at 2.19, BUN 40, possibly due to cardiorenal syndrome. -Consulted Dr. Candiss Norse of nephrology -Avoid using renal toxic medications -f/u ADAMTS13 and haptoglobin  Thrombocytopenia ([place]): Platelet 92.  Peripheral smear showed schistocytes.  LDH 215, fibrinogen 308.  Dr. Grayland Ormond of hematology is consulted.   Per Dr. Grayland Ormond, less likely to have TTP, potential differential diagnosis DIC. -Follow-up Dr. Gary Fleet recommendation -Per Dr. Grayland Ormond, okay to continue Xarelto  HLD (hyperlipidemia) -Lipitor  Type II diabetes mellitus with renal manifestations [facility]): recent A1c 5.9, well controlled.  Patient taking metformin and glipizide -Sliding scale insulin  Hypothyroid -Synthroid     DVT ppx: on Xarelto Code Status: Full code per patient and his nephew Family Communication:  Yes, patient's nephew   at bed side Disposition Plan:  Anticipate discharge back to previous environment Consults called: Dr. Humphrey Rolls of cardiology Admission status and Level of care: Progressive Cardiac:   as inpt       Status is: Inpatient  Remains inpatient appropriate because: Patient has multiple comorbidities, now presents with CHF exacerbation.  Patient also has worsening renal function, worsening anemia, thrombocytopenia.  His presentation is highly complicated.  Patient is at high risk of deteriorating given his very low EF of 15%, will need to be treated in the hospital for at least 2 days.          Date of Service 07/24/2021    Ivor Costa [facility]   If 7PM-7AM, please contact night-coverage [email or web address] 07/24/2021, 4:05 PM

## 2021-07-25 ENCOUNTER — Inpatient Hospital Stay
Admit: 2021-07-25 | Discharge: 2021-07-25 | Disposition: A | Discharge status: Home / Self Care | Payer: Medicare Other | Attending: Internal Medicine | Admitting: Internal Medicine

## 2021-07-25 DIAGNOSIS — I5023 Acute on chronic systolic (congestive) heart failure: Secondary | ICD-10-CM | POA: Diagnosis not present

## 2021-07-25 LAB — CBG MONITORING, ED
Glucose-Capillary: 66 mg/dL — ABNORMAL LOW (ref 70–99)
Glucose-Capillary: 254 mg/dL — ABNORMAL HIGH (ref 70–99)
Glucose-Capillary: 133 mg/dL — ABNORMAL HIGH (ref 70–99)

## 2021-07-25 LAB — ECHOCARDIOGRAM COMPLETE
AR max vel: 1.57 cm2
AV Peak grad: 4.2 mmHg
Ao pk vel: 1.03 m/s
Area-P 1/2: 3.56 cm2
Calc EF: 22.7 %
Height: 66 in
MV M vel: 4.08 m/s
MV Peak grad: 66.6 mmHg
P 1/2 time: 621 msec
Radius: 0.4 cm
S' Lateral: 5.65 cm
Single Plane A2C EF: 21.1 %
Single Plane A4C EF: 23 %
Weight: 2257.51 oz

## 2021-07-25 LAB — COMPREHENSIVE METABOLIC PANEL
ALT: 13 U/L (ref 0–44)
ALT: 13 U/L (ref 0–44)
AST: 25 U/L (ref 15–41)
AST: 26 U/L (ref 15–41)
Albumin: 3.7 g/dL (ref 3.5–5.0)
Albumin: 3.7 g/dL (ref 3.5–5.0)
Alkaline Phosphatase: 44 U/L (ref 38–126)
Alkaline Phosphatase: 47 U/L (ref 38–126)
Anion gap: 10 (ref 5–15)
Anion gap: 10 (ref 5–15)
BUN: 39 mg/dL — ABNORMAL HIGH (ref 8–23)
BUN: 37 mg/dL — ABNORMAL HIGH (ref 8–23)
CO2: 25 mmol/L (ref 22–32)
CO2: 26 mmol/L (ref 22–32)
Calcium: 8.2 mg/dL — ABNORMAL LOW (ref 8.9–10.3)
Calcium: 8.3 mg/dL — ABNORMAL LOW (ref 8.9–10.3)
Chloride: 102 mmol/L (ref 98–111)
Chloride: 101 mmol/L (ref 98–111)
Creatinine, Ser: 1.84 mg/dL — ABNORMAL HIGH (ref 0.61–1.24)
Creatinine, Ser: 1.91 mg/dL — ABNORMAL HIGH (ref 0.61–1.24)
GFR, Estimated: 38 mL/min — ABNORMAL LOW (ref >60)
GFR, Estimated: 36 mL/min — ABNORMAL LOW (ref >60)
Glucose, Bld: 69 mg/dL — ABNORMAL LOW (ref 70–99)
Glucose, Bld: 73 mg/dL (ref 70–99)
Potassium: 3.1 mmol/L — ABNORMAL LOW (ref 3.5–5.1)
Potassium: 3.3 mmol/L — ABNORMAL LOW (ref 3.5–5.1)
Sodium: 137 mmol/L (ref 135–145)
Sodium: 137 mmol/L (ref 135–145)
Total Bilirubin: 1.5 mg/dL — ABNORMAL HIGH (ref 0.3–1.2)
Total Bilirubin: 1.7 mg/dL — ABNORMAL HIGH (ref 0.3–1.2)
Total Protein: 6.5 g/dL (ref 6.5–8.1)
Total Protein: 6.9 g/dL (ref 6.5–8.1)

## 2021-07-25 LAB — MAGNESIUM: Magnesium: 1 mg/dL — ABNORMAL LOW (ref 1.7–2.4)

## 2021-07-25 LAB — CBC
HCT: 23.7 % — ABNORMAL LOW (ref 39.0–52.0)
HCT: 26.5 % — ABNORMAL LOW (ref 39.0–52.0)
Hemoglobin: 7.6 g/dL — ABNORMAL LOW (ref 13.0–17.0)
Hemoglobin: 8.7 g/dL — ABNORMAL LOW (ref 13.0–17.0)
MCH: 29.8 pg (ref 26.0–34.0)
MCH: 30.4 pg (ref 26.0–34.0)
MCHC: 32.1 g/dL (ref 30.0–36.0)
MCHC: 32.8 g/dL (ref 30.0–36.0)
MCV: 92.9 fL (ref 80.0–100.0)
MCV: 92.7 fL (ref 80.0–100.0)
Platelets: 82 10*3/uL — ABNORMAL LOW (ref 150–400)
Platelets: 132 10*3/uL — ABNORMAL LOW (ref 150–400)
RBC: 2.55 MIL/uL — ABNORMAL LOW (ref 4.22–5.81)
RBC: 2.86 MIL/uL — ABNORMAL LOW (ref 4.22–5.81)
RDW: 16.5 % — ABNORMAL HIGH (ref 11.5–15.5)
RDW: 16.4 % — ABNORMAL HIGH (ref 11.5–15.5)
WBC: 3.3 10*3/uL — ABNORMAL LOW (ref 4.0–10.5)
WBC: 4.7 10*3/uL (ref 4.0–10.5)
nRBC: 0 % (ref 0.0–0.2)
nRBC: 0 % (ref 0.0–0.2)

## 2021-07-25 LAB — GLUCOSE, CAPILLARY: Glucose-Capillary: 125 mg/dL — ABNORMAL HIGH (ref 70–99)

## 2021-07-25 LAB — LACTATE DEHYDROGENASE: LDH: 197 U/L — ABNORMAL HIGH (ref 98–192)

## 2021-07-25 LAB — HAPTOGLOBIN: Haptoglobin: 83 mg/dL (ref 34–355)

## 2021-07-25 MED ORDER — ATORVASTATIN CALCIUM 80 MG PO TABS
80.0000 mg | ORAL_TABLET | Freq: Every day | ORAL | Status: DC
  Administered 2021-07-25 – 2021-07-28 (×4): 80 mg via ORAL
  Filled 2021-07-25: qty 1
  Filled 2021-07-25: qty 4
  Filled 2021-07-25 (×2): qty 1

## 2021-07-25 MED ORDER — POLYETHYLENE GLYCOL 3350 17 G PO PACK: 17.0000 g | PACK | Freq: Every day | ORAL | Status: DC | PRN

## 2021-07-25 MED ORDER — FERROUS SULFATE 325 (65 FE) MG PO TABS
325.0000 mg | ORAL_TABLET | Freq: Every day | ORAL | Status: DC
  Administered 2021-07-25 – 2021-07-28 (×4): 325 mg via ORAL
  Filled 2021-07-25 (×4): qty 1

## 2021-07-25 MED ORDER — AMIODARONE HCL 200 MG PO TABS
200.0000 mg | ORAL_TABLET | Freq: Every day | ORAL | Status: DC
  Administered 2021-07-25 – 2021-07-28 (×4): 200 mg via ORAL
  Filled 2021-07-25 (×4): qty 1

## 2021-07-25 MED ORDER — MAGNESIUM SULFATE 4 GM/100ML IV SOLN
4.0000 g | Freq: Once | INTRAVENOUS | Status: AC
  Administered 2021-07-25: 4 g via INTRAVENOUS
  Filled 2021-07-25: qty 100

## 2021-07-25 MED ORDER — FOLIC ACID 1 MG PO TABS
1.0000 mg | ORAL_TABLET | ORAL | Status: DC
  Administered 2021-07-25 – 2021-07-28 (×4): 1 mg via ORAL
  Filled 2021-07-25 (×4): qty 1

## 2021-07-25 MED ORDER — LEVOTHYROXINE SODIUM 50 MCG PO TABS
50.0000 ug | ORAL_TABLET | Freq: Every day | ORAL | Status: DC
  Administered 2021-07-25 – 2021-07-28 (×4): 50 ug via ORAL
  Filled 2021-07-25 (×4): qty 1

## 2021-07-25 MED ORDER — RIVAROXABAN 15 MG PO TABS
15.0000 mg | ORAL_TABLET | Freq: Every day | ORAL | Status: DC
  Administered 2021-07-26 (×2): 15 mg via ORAL
  Filled 2021-07-25 (×3): qty 1

## 2021-07-25 MED ORDER — POTASSIUM CHLORIDE CRYS ER 20 MEQ PO TBCR
40.0000 meq | EXTENDED_RELEASE_TABLET | Freq: Once | ORAL | Status: AC
  Administered 2021-07-25: 40 meq via ORAL
  Filled 2021-07-25: qty 2

## 2021-07-25 MED ORDER — PANTOPRAZOLE SODIUM 20 MG PO TBEC: 20.0000 mg | DELAYED_RELEASE_TABLET | Freq: Every day | ORAL | Status: DC

## 2021-07-25 MED ORDER — NICOTINE 14 MG/24HR TD PT24
14.0000 mg | MEDICATED_PATCH | Freq: Every day | TRANSDERMAL | Status: DC
  Administered 2021-07-25 – 2021-07-26 (×2): 14 mg via TRANSDERMAL
  Filled 2021-07-25 (×5): qty 1

## 2021-07-25 MED ORDER — PERFLUTREN LIPID MICROSPHERE
1.0000 mL | INTRAVENOUS | Status: AC | PRN
  Administered 2021-07-25: 3.5 mL via INTRAVENOUS
  Filled 2021-07-25: qty 10

## 2021-07-25 MED ORDER — PANTOPRAZOLE SODIUM 40 MG PO TBEC
40.0000 mg | DELAYED_RELEASE_TABLET | Freq: Every day | ORAL | Status: DC
  Administered 2021-07-25 – 2021-07-28 (×4): 40 mg via ORAL
  Filled 2021-07-25 (×4): qty 1

## 2021-07-25 NOTE — Progress Notes (Signed)
PROGRESS NOTE    Walter Blair  ERX:[id number] DOB: 03-17-1946 DOA: 07/24/2021 PCP: Jodi Marble, MD   Chief Complain: Shortness of breath  Brief Narrative: Patient is a 75 year old male with history of hypertension, hyperlipidemia, diabetes type 2, stroke, hypothyroidism, coronary artery disease, A. fib on Xarelto, CHF with EF of 15%, CKD stage IIIa, status post pacemaker placement, hematuria following with urology who presented with shortness of breath.  He reported shortness of breath for more than 2 weeks and was progressively getting worse.  No report of chest pain.  Also had worsening bilateral lower extremity edema.  On presentation WBC count was 4.1.  Hemoglobin was 7.9, platelet of 72, INR of 3.4, PTT of 72, BNP of 3305, D-dimer of 3.8, he was hypotensive.  He was found to have worsening renal function.  Chest x-ray pulmonary edema.  Patient was admitted for the management of acute on chronic CHF exacerbation, possible DIC, AKI on CKD.  Cardiology, hematology, nephrology following.  Assessment & Plan:   Principal Problem:   Acute on chronic systolic CHF (congestive heart failure) (HCC) Active Problems:   HTN (hypertension)   CAD (coronary artery disease)   Iron deficiency anemia   Atrial fibrillation, chronic (HCC)   H/O: stroke   Hematuria   Acute renal failure superimposed on stage 3a chronic kidney disease (HCC)   Thrombocytopenia (HCC)   HLD (hyperlipidemia)   Type II diabetes mellitus with renal manifestations ([place])   Hypothyroid   Acute on chronic systolic congestive heart failure: Last 2D echo on 06/29/2017 had shown EF of 15%.  Patient with bilateral lower extremity edema, positive JVD, crackles on auscultation, pulm edema on chest x-ray, elevated BNP.  Clinically found to have CHF exacerbation.  Started on Lasix drip.  Cardiology following.  Entresto on hold due to soft BP.  2D echo has been ordered.  Monitor daily weight, input/output.  Patient looks severely  volume overloaded. Potassium and magnesium will be monitored.  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  Peripheral smear showed some schistocytes.  Neurology following .  Clinical scenario consistent with DIC.  TTP is less likely.  Xarelto being continued.  ADAMTS13 pending.  Avoid nephrotoxins.Monitor CBC  AKI on CKD stage IIIa: Baseline creatinine is around 1-1.2.  Presented with creatinine in the range of 2.  Possible cardiorenal syndrome.  Kidney function improving with IV diuresis  Chronic A. fib: Currently rate is well controlled.  On Xarelto for anticoagulation.  Also on amiodarone  Hypertension: Monitor blood pressure.  Currently soft.  On Lasix drip.  Antihypertensives on hold  History of coronary artery disease: No anginal symptoms.  On Lipitor  Normocytic deficiency anemia: Started on iron supplementation  History of stroke: On Xarelto and Lipitor  Hematuria: Currently following with urology.  Plan for CT urogram/cystoscopy as an outpatient.  CT abdomen/pelvis did not show any acute findings.  Since there is no significant drop in the hemoglobin, will continue Xarelto for now.  Hyperlipidemia: On Lipitor  Type 2 diabetes: Recent hemoglobin A1c of 5.9.  Well controlled.  On metformin and glipizide at home.  Currently on sliding scale insulin.           DVT prophylaxis:Xarelto Code Status: Full code Family Communication: Talked to nice Alverda Skeans( she is a physician) on phone on 07/25/21 Status is: Inpatient  Remains inpatient appropriate because: Critical illness     Consultants: Cardiology, nephrology, hematology  Procedures: None yet  Antimicrobials:  Anti-infectives (From  admission, onward)    None       Subjective:  Patient seen and examined at the bedside this morning.  Hemodynamically stable during my evaluation.  He does not look in any kind of distress,  comfortable, lying on the bed, denies shortness of breath or cough while at rest.  Looks severely volume overloaded.  Objective: Vitals:   07/25/21 0530 07/25/21 0600 07/25/21 0630 07/25/21 0700  BP: 109/64 103/63 114/63 140/60  Pulse: (!) 59 (!) 58 (!) 59 (!) 58  Resp: 15 14 (!) 21 15  Temp:      TempSrc:      SpO2: 100% 99% 100% 100%  Weight:      Height:        Intake/Output Summary (Last 24 hours) at 07/25/2021 0820 Last data filed at 07/25/2021 0500 Gross per 24 hour  Intake --  Output 1300 ml  Net -1300 ml   Filed Weights   07/24/21 1015  Weight: 64 kg    Examination:  General exam: Not in distress, chronically ill looking, pleasant elderly male HEENT: PERRL Respiratory system:  no wheezes or crackles  Cardiovascular system: S1 & S2 heard, RRR.  Pacemaker Gastrointestinal system: Abdomen is mildly distended, soft and nontender. Central nervous system: Alert and oriented Extremities: Severe bilateral lower extremity edema, no clubbing ,no cyanosis Skin: No rashes, no ulcers,no icterus      Data Reviewed: I have personally reviewed following labs and imaging studies  CBC: Recent Labs  Lab 07/24/21 1011 07/24/21 1645 07/25/21 0647  WBC 4.1 3.2* 3.3*  HGB 7.9* 7.3* 7.6*  HCT 24.6* 22.2* 23.7*  MCV 92.5 92.1 92.9  PLT 92* 78* 82*   Basic Metabolic Panel: Recent Labs  Lab 07/24/21 1011 07/25/21 0647  NA 134* 137  K 3.9 3.1*  CL 100 102  CO2 24 25  GLUCOSE 193* 69*  BUN 40* 39*  CREATININE 2.19* 1.84*  CALCIUM 8.6* 8.2*   GFR: Estimated Creatinine Clearance: 30.8 mL/min (A) (by C-G formula based on SCr of 1.84 mg/dL (H)). Liver Function Tests: Recent Labs  Lab 07/24/21 1011 07/25/21 0647  AST 29 25  ALT 13 13  ALKPHOS 49 44  BILITOT 1.4* 1.5*  PROT 6.7 6.5  ALBUMIN 3.7 3.7   No results for input(s): LIPASE, AMYLASE in the last 168 hours. No results for input(s): AMMONIA in the last 168 hours. Coagulation Profile: Recent Labs  Lab  07/24/21 1230  INR 3.4*   Cardiac Enzymes: No results for input(s): CKTOTAL, CKMB, CKMBINDEX, TROPONINI in the last 168 hours. BNP (last 3 results) No results for input(s): PROBNP in the last 8760 hours. HbA1C: No results for input(s): HGBA1C in the last 72 hours. CBG: Recent Labs  Lab 07/24/21 1753 07/24/21 2316  GLUCAP 85 82   Lipid Profile: No results for input(s): CHOL, HDL, LDLCALC, TRIG, CHOLHDL, LDLDIRECT in the last 72 hours. Thyroid Function Tests: No results for input(s): TSH, T4TOTAL, FREET4, T3FREE, THYROIDAB in the last 72 hours. Anemia Panel: Recent Labs    07/24/21 1230  RETICCTPCT 1.4   Sepsis Labs: No results for input(s): PROCALCITON, LATICACIDVEN in the last 168 hours.  Recent Results (from the past 240 hour(s))  Microscopic Examination     Status: Abnormal   Collection Time: 07/16/21  8:46 AM   Urine  Result Value Ref Range Status   WBC, UA 0-5 0 - 5 /hpf Final   RBC >30 (A) 0 - 2 /hpf Final   Epithelial Cells (non renal)  0-10 0 - 10 /hpf Final   Casts Present (A) None seen /lpf Final   Cast Type Hyaline casts N/A Final   Bacteria, UA Few None seen/Few Final  Resp Panel by RT-PCR (Flu A&B, Covid) Nasopharyngeal Swab     Status: None   Collection Time: 07/24/21 12:32 PM   Specimen: Nasopharyngeal Swab; Nasopharyngeal(NP) swabs in vial transport medium  Result Value Ref Range Status   SARS Coronavirus 2 by RT PCR NEGATIVE NEGATIVE Final    Comment: (NOTE) SARS-CoV-2 target nucleic acids are NOT DETECTED.  The SARS-CoV-2 RNA is generally detectable in upper respiratory specimens during the acute phase of infection. The lowest concentration of SARS-CoV-2 viral copies this assay can detect is 138 copies/mL. A negative result does not preclude SARS-Cov-2 infection and should not be used as the sole basis for treatment or other patient management decisions. A negative result may occur with  improper specimen collection/handling, submission of  specimen other than nasopharyngeal swab, presence of viral mutation(s) within the areas targeted by this assay, and inadequate number of viral copies(<138 copies/mL). A negative result must be combined with clinical observations, patient history, and epidemiological information. The expected result is Negative.  Fact Sheet for Patients:  EntrepreneurPulse.com.au  Fact Sheet for Healthcare Providers:  IncredibleEmployment.be  This test is no t yet approved or cleared by the [place] FDA and  has been authorized for detection and/or diagnosis of SARS-CoV-2 by FDA under an Emergency Use Authorization (EUA). This EUA will remain  in effect (meaning this test can be used) for the duration of the COVID-19 declaration under Section 564(b)(1) of the Act, 21 U.S.C.section 360bbb-3(b)(1), unless the authorization is terminated  or revoked sooner.       Influenza A by PCR NEGATIVE NEGATIVE Final   Influenza B by PCR NEGATIVE NEGATIVE Final    Comment: (NOTE) The Xpert Xpress SARS-CoV-2/FLU/RSV plus assay is intended as an aid in the diagnosis of influenza from Nasopharyngeal swab specimens and should not be used as a sole basis for treatment. Nasal washings and aspirates are unacceptable for Xpert Xpress SARS-CoV-2/FLU/RSV testing.  Fact Sheet for Patients: EntrepreneurPulse.com.au  Fact Sheet for Healthcare Providers: IncredibleEmployment.be  This test is not yet approved or cleared by the [place] FDA and has been authorized for detection and/or diagnosis of SARS-CoV-2 by FDA under an Emergency Use Authorization (EUA). This EUA will remain in effect (meaning this test can be used) for the duration of the COVID-19 declaration under Section 564(b)(1) of the Act, 21 U.S.C. section 360bbb-3(b)(1), unless the authorization is terminated or revoked.  Performed at [facility], [address] [place], [place] [address]          Radiology Studies: CT ABDOMEN PELVIS WO CONTRAST  Result Date: 07/24/2021 CLINICAL DATA:  Abdominal distension, shortness of breath, retained fluid EXAM: CT ABDOMEN AND PELVIS WITHOUT CONTRAST TECHNIQUE: Multidetector CT imaging of the abdomen and pelvis was performed following the standard protocol without IV contrast. COMPARISON:  None. FINDINGS: Lower chest: Cardiomegaly without pericardial effusion. Pacer wires in the right heart. Previous median sternotomy. Native coronary atherosclerosis present. Trace pleural effusions. Mild bibasilar reticulonodular and peripheral tree in bud opacities worse on the left may represent mild bibasilar pneumonitis/pneumonia. Left lower lobe mild cylindrical bronchiectasis noted. Nonspecific nodular pleural thickening on the left. This is incompletely evaluated by abdominal imaging only. Hepatobiliary: Limited without IV contrast. Distended hepatic IVC and hepatic veins compatible with right heart failure. Peri portal edema suspected. No biliary obstruction pattern  or large focal hepatic abnormality. Small layering calcified gallstones noted. Gallbladder nondistended. Common bile duct nondilated. Pancreas: Unremarkable. No pancreatic ductal dilatation or surrounding inflammatory changes. Spleen: Normal in size without focal abnormality. Adrenals/Urinary Tract: Nonspecific adrenal thickening bilaterally. Limited evaluation without contrast. No renal obstruction pattern. Renal vascular calcifications present. No hydroureter or ureteral calculus. No hydronephrosis. Posterior nonspecific bladder wall thickening. Stomach/Bowel: Negative for bowel obstruction, significant dilatation, ileus, or free air. Normal appendix. Small amount of upper abdomen perihepatic and perisplenic ascites. Small amount of dependent pelvic ascites. No focal fluid collection or abscess. Vascular/Lymphatic: Limited without IV contrast. Abdominal atherosclerosis  noted. Negative for aneurysm. No retroperitoneal hemorrhage or hematoma. No bulky adenopathy. Reproductive: No significant finding by CT. Other: Intact abdominal wall.  No hernia.  No inguinal abnormality. Musculoskeletal: Degenerative changes of the spine and facet joints. Old compression fracture at L4. Bones are osteopenic. IMPRESSION: Cardiomegaly. Trace pleural effusions and bibasilar reticulonodular/tree in bud mild opacities worse on the left. Difficult to exclude mild bibasilar bronchopneumonia. Small amount abdominopelvic ascites. Cholelithiasis Abdominal atherosclerosis No other acute intra-abdominal finding by noncontrast CT. Electronically Signed   By: Jerilynn Mages.  Shick M.D.   On: 07/24/2021 14:53   DG Chest 2 View  Result Date: 07/24/2021 CLINICAL DATA:  sob EXAM: CHEST - 2 VIEW COMPARISON:  July 05, 2017 FINDINGS: The cardiomediastinal silhouette is unchanged in contour.Status post median sternotomy and CABG. LEFT chest AICD. Small LEFT pleural effusion. No pneumothorax. Mild diffuse interstitial prominence. LEFT retrocardiac opacities. Visualized abdomen is unremarkable. Mild degenerative changes of the thoracic spine. IMPRESSION: Constellation of findings are favored to reflect mild pulmonary edema with LEFT basilar atelectasis and small LEFT pleural effusion. Electronically Signed   By: Valentino Saxon M.D.   On: 07/24/2021 10:48        Scheduled Meds:  amiodarone  200 mg Oral Daily   atorvastatin  80 mg Oral Daily   ferrous sulfate  325 mg Oral Q breakfast   folic acid  1 mg Oral [id number]   insulin aspart  0-5 Units Subcutaneous QHS   insulin aspart  0-9 Units Subcutaneous TID WC   levothyroxine  50 mcg Oral Q0600   pantoprazole  20 mg Oral Daily   potassium chloride  40 mEq Oral Once   Rivaroxaban  15 mg Oral q morning   Continuous Infusions:  furosemide (LASIX) 200 mg in dextrose 5% 100 mL (2mg /mL) infusion 8 mg/hr (07/24/21 1709)     LOS: 1 day    Time spent: 35  mins.More than 50% of that time was spent in counseling and/or coordination of care.      Shelly Coss, MD [facility] P11/01/2021, 8:20 AM

## 2021-07-25 NOTE — Progress Notes (Signed)
*  PRELIMINARY RESULTS* Echocardiogram 2D Echocardiogram has been performed. Definity IV Contrast used on this study.  Claretta Fraise 07/25/2021, 11:44 AM

## 2021-07-25 NOTE — ED Notes (Signed)
Pharmacy called to send another lasix bag

## 2021-07-25 NOTE — ED Notes (Signed)
Pt's bed wet changed linen and chucks changed. Pt voided bladder scan showed 212ml after RN bladder scanned pt. Pt denies any discomfort

## 2021-07-25 NOTE — Progress Notes (Addendum)
Walter Blair  MRN: [id number]  DOB/AGE: [birth year] 75 y.o.  Primary Care Physician:Tejan-Sie, Brandt Loosen, MD  Admit date: 07/24/2021  Chief Complaint:  Chief Complaint  Patient presents with   Shortness of Breath    S-Pt presented on  07/24/2021 with  Chief Complaint  Patient presents with   Shortness of Breath  . Patient was seen in the ER as patient has not been moved into the room yet Patient main complaint in today visit was that he has made a lot of urine overnight Patient feels better than yesterday Patient family was present in the room. I answered patient and the family questions to the best of my ability  Medications   amiodarone  200 mg Oral Daily   atorvastatin  80 mg Oral Daily   ferrous sulfate  325 mg Oral Q breakfast   folic acid  1 mg Oral [id number]   insulin aspart  0-5 Units Subcutaneous QHS   insulin aspart  0-9 Units Subcutaneous TID WC   levothyroxine  50 mcg Oral Q0600   pantoprazole  40 mg Oral Daily   potassium chloride  40 mEq Oral Once   Rivaroxaban  15 mg Oral q morning         PPI:RJJOA from the symptoms mentioned above,there are no other symptoms referable to all systems reviewed.  Physical Exam: Vital signs in last 24 hours: Temp:  [97.9 F (36.6 C)] 97.9 F (36.6 C) (11/04 1015) Pulse Rate:  [57-63] 58 (11/05 0700) Resp:  [12-21] 15 (11/05 0700) BP: (89-140)/(60-89) 140/60 (11/05 0700) SpO2:  [93 %-100 %] 100 % (11/05 0700) Weight:  [64 kg] 64 kg (11/04 1015) Weight change:     Intake/Output from previous day: 11/04 0701 - 11/05 0700 In: -  Out: 1300 [Urine:1300] No intake/output data recorded.   Physical Exam:  General- pt is awake,alert, oriented to time place and person  Resp- No acute REsp distress, decreased at bases  CVS- S1S2 regular in rate and rhythm  GIT- BS+, soft, Non tender , Non distended  EXT- 2+  LE Edema,  No Cyanosis    Lab Results:  CBC  Recent Labs    07/24/21 1645 07/25/21 0647  WBC  3.2* 3.3*  HGB 7.3* 7.6*  HCT 22.2* 23.7*  PLT 78* 82*    BMET  Recent Labs    07/25/21 0647 07/25/21 0834  NA 137 137  K 3.1* 3.3*  CL 102 101  CO2 25 26  GLUCOSE 69* 73  BUN 39* 37*  CREATININE 1.84* 1.91*  CALCIUM 8.2* 8.3*      Most recent Creatinine trend  Lab Results  Component Value Date   CREATININE 1.91 (H) 07/25/2021   CREATININE 1.84 (H) 07/25/2021   CREATININE 2.19 (H) 07/24/2021      MICRO   Recent Results (from the past 240 hour(s))  Microscopic Examination     Status: Abnormal   Collection Time: 07/16/21  8:46 AM   Urine  Result Value Ref Range Status   WBC, UA 0-5 0 - 5 /hpf Final   RBC >30 (A) 0 - 2 /hpf Final   Epithelial Cells (non renal) 0-10 0 - 10 /hpf Final   Casts Present (A) None seen /lpf Final   Cast Type Hyaline casts N/A Final   Bacteria, UA Few None seen/Few Final  Resp Panel by RT-PCR (Flu A&B, Covid) Nasopharyngeal Swab     Status: None   Collection Time: 07/24/21 12:32 PM   Specimen: Nasopharyngeal Swab;  Nasopharyngeal(NP) swabs in vial transport medium  Result Value Ref Range Status   SARS Coronavirus 2 by RT PCR NEGATIVE NEGATIVE Final    Comment: (NOTE) SARS-CoV-2 target nucleic acids are NOT DETECTED.  The SARS-CoV-2 RNA is generally detectable in upper respiratory specimens during the acute phase of infection. The lowest concentration of SARS-CoV-2 viral copies this assay can detect is 138 copies/mL. A negative result does not preclude SARS-Cov-2 infection and should not be used as the sole basis for treatment or other patient management decisions. A negative result may occur with  improper specimen collection/handling, submission of specimen other than nasopharyngeal swab, presence of viral mutation(s) within the areas targeted by this assay, and inadequate number of viral copies(<138 copies/mL). A negative result must be combined with clinical observations, patient history, and epidemiological information. The  expected result is Negative.  Fact Sheet for Patients:  EntrepreneurPulse.com.au  Fact Sheet for Healthcare Providers:  IncredibleEmployment.be  This test is no t yet approved or cleared by the [place] FDA and  has been authorized for detection and/or diagnosis of SARS-CoV-2 by FDA under an Emergency Use Authorization (EUA). This EUA will remain  in effect (meaning this test can be used) for the duration of the COVID-19 declaration under Section 564(b)(1) of the Act, 21 U.S.C.section 360bbb-3(b)(1), unless the authorization is terminated  or revoked sooner.       Influenza A by PCR NEGATIVE NEGATIVE Final   Influenza B by PCR NEGATIVE NEGATIVE Final    Comment: (NOTE) The Xpert Xpress SARS-CoV-2/FLU/RSV plus assay is intended as an aid in the diagnosis of influenza from Nasopharyngeal swab specimens and should not be used as a sole basis for treatment. Nasal washings and aspirates are unacceptable for Xpert Xpress SARS-CoV-2/FLU/RSV testing.  Fact Sheet for Patients: EntrepreneurPulse.com.au  Fact Sheet for Healthcare Providers: IncredibleEmployment.be  This test is not yet approved or cleared by the [place] FDA and has been authorized for detection and/or diagnosis of SARS-CoV-2 by FDA under an Emergency Use Authorization (EUA). This EUA will remain in effect (meaning this test can be used) for the duration of the COVID-19 declaration under Section 564(b)(1) of the Act, 21 U.S.C. section 360bbb-3(b)(1), unless the authorization is terminated or revoked.  Performed at [facility], [address] [place], [place] [address]          Impression:  Walter Blair is a 75 y.o. male with medical problems of      was admitted on 07/24/2021 for :   Acute on chronic systolic CHF (congestive heart failure) ([place]) [I50.23]   1 AKI 2 CKD st 3 A. Baseline Creatinine of 1.4/GFR 49  from 01/05/2021 3 Gross hematuria 4 Hypotension 5 Acute Exacerbation of chronic systolic CHF. (EF 87-56 % per chart) 6 Thrombocytopenia   Cause for AKI is not entirely clear but may be related to cardiorenal syndrome and abnormal hemodynamics   1)Renal    AKI Patient has AKI secondary to ATN Data in favor of ATN is Hypotension-patient had low blood pressure at time of presentation -Is on RAS blockers as an outpatient Patient AKI could also be from cardiorenal syndrome as patient has EF of around 15% Patient creatinine is currently stable Patient has AKI on CKD And that in favor of CKD as patient has a creatinine of 1.4 going back to April of this year    2)Hypotension Patient blood pressure is now stable  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  Patient's [place] test  was negative  CBC Latest Ref Rng & Units 07/25/2021 07/24/2021 07/24/2021  WBC 4.0 - 10.5 K/uL 3.3(L) 3.2(L) 4.1  Hemoglobin 13.0 - 17.0 g/dL 7.6(L) 7.3(L) 7.9(L)  Hematocrit 39.0 - 52.0 % 23.7(L) 22.2(L) 24.6(L)  Platelets 150 - 400 K/uL 82(L) 78(L) 92(L)    Hematologist following 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      4) Hematuria Patient UA does show gross hematuria Patient has had this issue going back to 2018 Patient has had outpatient urology scheduled for cystoscopy and further work-up   5)Acute on chronic systolic CHF Patient is currently on diuretics/IV Lasix drip at 8 mg/h Cardiology is following   6) Electrolytes   BMP Latest Ref Rng & Units 07/25/2021 07/25/2021 07/24/2021  Glucose 70 - 99 mg/dL 73 69(L) 193(H)  BUN 8 - 23 mg/dL 37(H) 39(H) 40(H)  Creatinine 0.61 - 1.24 mg/dL 1.91(H) 1.84(H) 2.19(H)  Sodium 135 - 145 mmol/L 137 137 134(L)  Potassium 3.5 - 5.1 mmol/L 3.3(L) 3.1(L) 3.9  Chloride 98 - 111 mmol/L 101 102 100  CO2 22 - 32 mmol/L 26 25 24   Calcium 8.9 - 10.3 mg/dL 8.3(L) 8.2(L) 8.6(L)      Sodium Normonatremic   Potassium Hypokalemia Patient is hypokalemic secondary to IV diuretics Patient potassium is being repleted    7)Acid base    Co2 at goal     Plan:  We will continue patient on current IV diuresis       Wanza Szumski s [facility] 07/25/2021, 9:45 AM

## 2021-07-25 NOTE — ED Notes (Signed)
Pt given a cup of orange juice, while waiting for breakfast tray due to CBG being 66.

## 2021-07-25 NOTE — ED Notes (Signed)
Pt changed, linens changed, and new external catheter placed on the patient.  Pt able to roll and assist well. Pt denies any other needs or concerns at this time.

## 2021-07-25 NOTE — ED Notes (Signed)
Echo at bedside

## 2021-07-25 NOTE — Progress Notes (Signed)
SUBJECTIVE: Patient feeling much better   Vitals:   07/25/21 0900 07/25/21 0930 07/25/21 1000 07/25/21 1030  BP: 115/62 113/61 105/73 131/72  Pulse: 60 88    Resp: 15 13 12 18   Temp:      TempSrc:      SpO2: 100% 96%    Weight:      Height:        Intake/Output Summary (Last 24 hours) at 07/25/2021 1136 Last data filed at 07/25/2021 0500 Gross per 24 hour  Intake --  Output 1300 ml  Net -1300 ml    LABS: Basic Metabolic Panel: Recent Labs    07/25/21 0647 07/25/21 0834  NA 137 137  K 3.1* 3.3*  CL 102 101  CO2 25 26  GLUCOSE 69* 73  BUN 39* 37*  CREATININE 1.84* 1.91*  CALCIUM 8.2* 8.3*  MG  --  1.0*   Liver Function Tests: Recent Labs    07/25/21 0647 07/25/21 0834  AST 25 26  ALT 13 13  ALKPHOS 44 47  BILITOT 1.5* 1.7*  PROT 6.5 6.9  ALBUMIN 3.7 3.7   No results for input(s): LIPASE, AMYLASE in the last 72 hours. CBC: Recent Labs    07/24/21 1645 07/25/21 0647  WBC 3.2* 3.3*  HGB 7.3* 7.6*  HCT 22.2* 23.7*  MCV 92.1 92.9  PLT 78* 82*   Cardiac Enzymes: No results for input(s): CKTOTAL, CKMB, CKMBINDEX, TROPONINI in the last 72 hours. BNP: Invalid input(s): POCBNP D-Dimer: Recent Labs    07/24/21 1011  DDIMER 3.38*   Hemoglobin A1C: No results for input(s): HGBA1C in the last 72 hours. Fasting Lipid Panel: No results for input(s): CHOL, HDL, LDLCALC, TRIG, CHOLHDL, LDLDIRECT in the last 72 hours. Thyroid Function Tests: No results for input(s): TSH, T4TOTAL, T3FREE, THYROIDAB in the last 72 hours.  Invalid input(s): FREET3 Anemia Panel: Recent Labs    07/24/21 1230  RETICCTPCT 1.4     PHYSICAL EXAM General: Well developed, well nourished, in no acute distress HEENT:  Normocephalic and atramatic Neck:  No JVD.  Lungs: Clear bilaterally to auscultation and percussion. Heart: HRRR . Normal S1 and S2 without gallops or murmurs.  Abdomen: Bowel sounds are positive, abdomen soft and non-tender  Msk:  Back normal, normal gait.  Normal strength and tone for age. Extremities: No clubbing, cyanosis or edema.   Neuro: Alert and oriented X 3. Psych:  Good affect, responds appropriately  TELEMETRY: Paced rhythm  ASSESSMENT AND PLAN: Congestive heart failure with severe LV dysfunction with left ventricular ejection fraction 15% and four-chamber dilatation with history of defibrillator.  Patient failed to diurese with p.o. Aldactone and torsemide and was sent to the hospital for IV Lasix while Entresto and carvedilol are on hold.  Swelling of the legs is gradually getting better.  Principal Problem:   Acute on chronic systolic CHF (congestive heart failure) (HCC) Active Problems:   HTN (hypertension)   CAD (coronary artery disease)   Iron deficiency anemia   Atrial fibrillation, chronic (HCC)   H/O: stroke   Hematuria   Acute renal failure superimposed on stage 3a chronic kidney disease (HCC)   Thrombocytopenia (HCC)   HLD (hyperlipidemia)   Type II diabetes mellitus with renal manifestations ([place])   Hypothyroid    Walter Highfill A, MD, [facility] 07/25/2021 11:36 AM

## 2021-07-26 ENCOUNTER — Encounter: Payer: Self-pay | Admitting: Internal Medicine

## 2021-07-26 DIAGNOSIS — I5023 Acute on chronic systolic (congestive) heart failure: Secondary | ICD-10-CM | POA: Diagnosis not present

## 2021-07-26 LAB — CBC
HCT: 22.9 % — ABNORMAL LOW (ref 39.0–52.0)
HCT: 24.3 % — ABNORMAL LOW (ref 39.0–52.0)
Hemoglobin: 7.4 g/dL — ABNORMAL LOW (ref 13.0–17.0)
Hemoglobin: 7.9 g/dL — ABNORMAL LOW (ref 13.0–17.0)
MCH: 29.4 pg (ref 26.0–34.0)
MCH: 30.2 pg (ref 26.0–34.0)
MCHC: 32.3 g/dL (ref 30.0–36.0)
MCHC: 32.5 g/dL (ref 30.0–36.0)
MCV: 90.9 fL (ref 80.0–100.0)
MCV: 92.7 fL (ref 80.0–100.0)
Platelets: 83 10*3/uL — ABNORMAL LOW (ref 150–400)
Platelets: 100 10*3/uL — ABNORMAL LOW (ref 150–400)
RBC: 2.52 MIL/uL — ABNORMAL LOW (ref 4.22–5.81)
RBC: 2.62 MIL/uL — ABNORMAL LOW (ref 4.22–5.81)
RDW: 16.4 % — ABNORMAL HIGH (ref 11.5–15.5)
RDW: 16.6 % — ABNORMAL HIGH (ref 11.5–15.5)
WBC: 3.6 10*3/uL — ABNORMAL LOW (ref 4.0–10.5)
WBC: 4.1 10*3/uL (ref 4.0–10.5)
nRBC: 0 % (ref 0.0–0.2)
nRBC: 0 % (ref 0.0–0.2)

## 2021-07-26 LAB — BASIC METABOLIC PANEL
Anion gap: 11 (ref 5–15)
BUN: 35 mg/dL — ABNORMAL HIGH (ref 8–23)
CO2: 25 mmol/L (ref 22–32)
Calcium: 8 mg/dL — ABNORMAL LOW (ref 8.9–10.3)
Chloride: 101 mmol/L (ref 98–111)
Creatinine, Ser: 1.71 mg/dL — ABNORMAL HIGH (ref 0.61–1.24)
GFR, Estimated: 41 mL/min — ABNORMAL LOW (ref >60)
Glucose, Bld: 92 mg/dL (ref 70–99)
Potassium: 3.3 mmol/L — ABNORMAL LOW (ref 3.5–5.1)
Sodium: 137 mmol/L (ref 135–145)

## 2021-07-26 LAB — GLUCOSE, CAPILLARY
Glucose-Capillary: 90 mg/dL (ref 70–99)
Glucose-Capillary: 209 mg/dL — ABNORMAL HIGH (ref 70–99)
Glucose-Capillary: 164 mg/dL — ABNORMAL HIGH (ref 70–99)
Glucose-Capillary: 178 mg/dL — ABNORMAL HIGH (ref 70–99)

## 2021-07-26 LAB — ADAMTS13 ACTIVITY REFLEX

## 2021-07-26 LAB — ADAMTS13 ACTIVITY: Adamts 13 Activity: 41.9 % — ABNORMAL LOW (ref >66.8)

## 2021-07-26 LAB — MAGNESIUM: Magnesium: 1.9 mg/dL (ref 1.7–2.4)

## 2021-07-26 MED ORDER — POTASSIUM CHLORIDE CRYS ER 20 MEQ PO TBCR
40.0000 meq | EXTENDED_RELEASE_TABLET | Freq: Once | ORAL | Status: AC
  Administered 2021-07-26: 40 meq via ORAL
  Filled 2021-07-26: qty 4

## 2021-07-26 NOTE — Progress Notes (Signed)
PROGRESS NOTE    Dearies Meikle  HMC:[id number] DOB: [birth year] DOA: 07/24/2021 PCP: Jodi Marble, MD   Chief Complain: Shortness of breath  Brief Narrative: Patient is a 75 year old male with history of hypertension, hyperlipidemia, diabetes type 2, stroke, hypothyroidism, coronary artery disease, A. fib on Xarelto, CHF with EF of 15%, CKD stage IIIa, status post pacemaker placement, hematuria following with urology who presented with shortness of breath.  He reported shortness of breath for more than 2 weeks and was progressively getting worse.  No report of chest pain.  Also had worsening bilateral lower extremity edema.  On presentation WBC count was 4.1.  Hemoglobin was 7.9, platelet of 72, INR of 3.4, PTT of 72, BNP of 3305, D-dimer of 3.8, he was hypotensive.  He was found to have worsening renal function.  Chest x-ray pulmonary edema.  Patient was admitted for the management of acute on chronic CHF exacerbation, possible DIC, AKI on CKD.  Cardiology, hematology, nephrology following.  Assessment & Plan:   Principal Problem:   Acute on chronic systolic CHF (congestive heart failure) (HCC) Active Problems:   HTN (hypertension)   CAD (coronary artery disease)   Iron deficiency anemia   Atrial fibrillation, chronic (HCC)   H/O: stroke   Hematuria   Acute renal failure superimposed on stage 3a chronic kidney disease (HCC)   Thrombocytopenia (HCC)   HLD (hyperlipidemia)   Type II diabetes mellitus with renal manifestations ([place])   Hypothyroid   Acute on chronic systolic congestive heart failure: Last 2D echo on 06/29/2017 had shown EF of 15%.  Patient with bilateral lower extremity edema, positive JVD, crackles on auscultation, pulm edema on chest x-ray, elevated BNP.  Clinically found to have CHF exacerbation.  Started on Lasix drip.  Cardiology following.  Entresto on hold due to soft BP.  2D echo has been ordered.  Monitor daily weight, input/output.  Patient looked severely  volume overloaded on presentation, bilateral lower EXTR edema has completely resolved. 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  Peripheral smear showed some schistocytes.  Neurology following .  Clinical scenario consistent with DIC.  TTP is less likely.  Xarelto being continued.  ADAMTS13 pending.  Avoid nephrotoxins.Monitor CBC  AKI on CKD stage IIIa: Baseline creatinine is around 1-1.2.  Presented with creatinine in the range of 2.  Possible cardiorenal syndrome vs ATN as he was hypotensive. kidney function improving with IV diuresis  Chronic A. fib: Currently rate is well controlled.  On Xarelto for anticoagulation.  Also on amiodarone  Hypertension: Monitor blood pressure.  Currently soft.  On Lasix drip.  Antihypertensives on hold  History of coronary artery disease: No anginal symptoms.  On Lipitor  Normocytic deficiency anemia: Started on iron supplementation  History of stroke: On Xarelto and Lipitor  Hematuria: Currently following with urology.  Plan for CT urogram/cystoscopy as an outpatient.  CT abdomen/pelvis did not show any acute findings.  Since there is no significant drop in the hemoglobin, will continue Xarelto for now.  Hyperlipidemia: On Lipitor  Type 2 diabetes: Recent hemoglobin A1c of 5.9.  Well controlled.  On metformin and glipizide at home.  Currently on sliding scale insulin.  Generalized weakness: PT/OT ordered           DVT prophylaxis:Xarelto Code Status: Full code Family Communication: Talked to nice Alverda Skeans( she is a physician) on phone on 07/26/21 on [phone] Status is:  Inpatient  Remains inpatient appropriate because: Critical illness     Consultants: Cardiology, nephrology, hematology  Procedures: None yet  Antimicrobials:  Anti-infectives (From admission, onward)    None        Subjective:  Patient seen and examined the bedside this morning.  Looks comfortable, lying on the bed.  Denies any shortness of breath or cough.  Lower extremity swelling has completely resolved.  Denies any new complaints today   Objective: Vitals:   07/25/21 2050 07/26/21 0050 07/26/21 0104 07/26/21 0407  BP: (!) 95/57 111/68  105/67  Pulse: 62 (!) 59  (!) 58  Resp: 18 18  18   Temp: 98.7 F (37.1 C) 98.6 F (37 C)  98.7 F (37.1 C)  TempSrc:  Oral    SpO2: 100% 98%  100%  Weight:   58 kg   Height:        Intake/Output Summary (Last 24 hours) at 07/26/2021 0739 Last data filed at 07/26/2021 0030 Gross per 24 hour  Intake --  Output 1875 ml  Net -1875 ml   Filed Weights   07/24/21 1015 07/25/21 1902 07/26/21 0104  Weight: 64 kg 58.7 kg 58 kg    Examination:   General exam: Overall comfortable, lying in bed  HEENT: PERRL Respiratory system:  no wheezes or crackles  Cardiovascular system: S1 & S2 heard, RRR.  Pacemaker Gastrointestinal system: Abdomen is nondistended, soft and nontender. Central nervous system: Alert and oriented Extremities: No edema, no clubbing ,no cyanosis Skin: No rashes, no ulcers,no icterus    Data Reviewed: I have personally reviewed following labs and imaging studies  CBC: Recent Labs  Lab 07/24/21 1011 07/24/21 1645 07/25/21 0647 07/25/21 1900 07/26/21 0522  WBC 4.1 3.2* 3.3* 4.7 3.6*  HGB 7.9* 7.3* 7.6* 8.7* 7.4*  HCT 24.6* 22.2* 23.7* 26.5* 22.9*  MCV 92.5 92.1 92.9 92.7 90.9  PLT 92* 78* 82* 132* 83*   Basic Metabolic Panel: Recent Labs  Lab 07/24/21 1011 07/25/21 0647 07/25/21 0834 07/26/21 0522  NA 134* 137 137 137  K 3.9 3.1* 3.3* 3.3*  CL 100 102 101 101  CO2 24 25 26 25   GLUCOSE 193* 69* 73 92  BUN 40* 39* 37* 35*  CREATININE 2.19* 1.84* 1.91* 1.71*  CALCIUM 8.6* 8.2* 8.3* 8.0*  MG  --   --  1.0* 1.9   GFR: Estimated Creatinine Clearance: 30.1 mL/min (A) (by C-G formula based on SCr of 1.71 mg/dL  (H)). Liver Function Tests: Recent Labs  Lab 07/24/21 1011 07/25/21 0647 07/25/21 0834  AST 29 25 26   ALT 13 13 13   ALKPHOS 49 44 47  BILITOT 1.4* 1.5* 1.7*  PROT 6.7 6.5 6.9  ALBUMIN 3.7 3.7 3.7   No results for input(s): LIPASE, AMYLASE in the last 168 hours. No results for input(s): AMMONIA in the last 168 hours. Coagulation Profile: Recent Labs  Lab 07/24/21 1230  INR 3.4*   Cardiac Enzymes: No results for input(s): CKTOTAL, CKMB, CKMBINDEX, TROPONINI in the last 168 hours. BNP (last 3 results) No results for input(s): PROBNP in the last 8760 hours. HbA1C: No results for input(s): HGBA1C in the last 72 hours. CBG: Recent Labs  Lab 07/24/21 2316 07/25/21 0830 07/25/21 1300 07/25/21 1720 07/25/21 2116  GLUCAP 82 66* 254* 133* 125*   Lipid Profile: No results for input(s): CHOL, HDL, LDLCALC, TRIG, CHOLHDL, LDLDIRECT in the last 72 hours. Thyroid Function Tests: No results for input(s): TSH, T4TOTAL, FREET4, T3FREE, THYROIDAB in the last 72 hours.  Anemia Panel: Recent Labs    07/24/21 1230  RETICCTPCT 1.4   Sepsis Labs: No results for input(s): PROCALCITON, LATICACIDVEN in the last 168 hours.  Recent Results (from the past 240 hour(s))  Microscopic Examination     Status: Abnormal   Collection Time: 07/16/21  8:46 AM   Urine  Result Value Ref Range Status   WBC, UA 0-5 0 - 5 /hpf Final   RBC >30 (A) 0 - 2 /hpf Final   Epithelial Cells (non renal) 0-10 0 - 10 /hpf Final   Casts Present (A) None seen /lpf Final   Cast Type Hyaline casts N/A Final   Bacteria, UA Few None seen/Few Final  Resp Panel by RT-PCR (Flu A&B, Covid) Nasopharyngeal Swab     Status: None   Collection Time: 07/24/21 12:32 PM   Specimen: Nasopharyngeal Swab; Nasopharyngeal(NP) swabs in vial transport medium  Result Value Ref Range Status   SARS Coronavirus 2 by RT PCR NEGATIVE NEGATIVE Final    Comment: (NOTE) SARS-CoV-2 target nucleic acids are NOT DETECTED.  The SARS-CoV-2 RNA  is generally detectable in upper respiratory specimens during the acute phase of infection. The lowest concentration of SARS-CoV-2 viral copies this assay can detect is 138 copies/mL. A negative result does not preclude SARS-Cov-2 infection and should not be used as the sole basis for treatment or other patient management decisions. A negative result may occur with  improper specimen collection/handling, submission of specimen other than nasopharyngeal swab, presence of viral mutation(s) within the areas targeted by this assay, and inadequate number of viral copies(<138 copies/mL). A negative result must be combined with clinical observations, patient history, and epidemiological information. The expected result is Negative.  Fact Sheet for Patients:  EntrepreneurPulse.com.au  Fact Sheet for Healthcare Providers:  IncredibleEmployment.be  This test is no t yet approved or cleared by the [place] FDA and  has been authorized for detection and/or diagnosis of SARS-CoV-2 by FDA under an Emergency Use Authorization (EUA). This EUA will remain  in effect (meaning this test can be used) for the duration of the COVID-19 declaration under Section 564(b)(1) of the Act, 21 U.S.C.section 360bbb-3(b)(1), unless the authorization is terminated  or revoked sooner.       Influenza A by PCR NEGATIVE NEGATIVE Final   Influenza B by PCR NEGATIVE NEGATIVE Final    Comment: (NOTE) The Xpert Xpress SARS-CoV-2/FLU/RSV plus assay is intended as an aid in the diagnosis of influenza from Nasopharyngeal swab specimens and should not be used as a sole basis for treatment. Nasal washings and aspirates are unacceptable for Xpert Xpress SARS-CoV-2/FLU/RSV testing.  Fact Sheet for Patients: EntrepreneurPulse.com.au  Fact Sheet for Healthcare Providers: IncredibleEmployment.be  This test is not yet approved or cleared by the  [place] FDA and has been authorized for detection and/or diagnosis of SARS-CoV-2 by FDA under an Emergency Use Authorization (EUA). This EUA will remain in effect (meaning this test can be used) for the duration of the COVID-19 declaration under Section 564(b)(1) of the Act, 21 U.S.C. section 360bbb-3(b)(1), unless the authorization is terminated or revoked.  Performed at [facility], [address] [place], [place] [address]          Radiology Studies: CT ABDOMEN PELVIS WO CONTRAST  Result Date: 07/24/2021 CLINICAL DATA:  Abdominal distension, shortness of breath, retained fluid EXAM: CT ABDOMEN AND PELVIS WITHOUT CONTRAST TECHNIQUE: Multidetector CT imaging of the abdomen and pelvis was performed following the standard protocol without IV contrast. COMPARISON:  None.  FINDINGS: Lower chest: Cardiomegaly without pericardial effusion. Pacer wires in the right heart. Previous median sternotomy. Native coronary atherosclerosis present. Trace pleural effusions. Mild bibasilar reticulonodular and peripheral tree in bud opacities worse on the left may represent mild bibasilar pneumonitis/pneumonia. Left lower lobe mild cylindrical bronchiectasis noted. Nonspecific nodular pleural thickening on the left. This is incompletely evaluated by abdominal imaging only. Hepatobiliary: Limited without IV contrast. Distended hepatic IVC and hepatic veins compatible with right heart failure. Peri portal edema suspected. No biliary obstruction pattern or large focal hepatic abnormality. Small layering calcified gallstones noted. Gallbladder nondistended. Common bile duct nondilated. Pancreas: Unremarkable. No pancreatic ductal dilatation or surrounding inflammatory changes. Spleen: Normal in size without focal abnormality. Adrenals/Urinary Tract: Nonspecific adrenal thickening bilaterally. Limited evaluation without contrast. No renal obstruction pattern. Renal vascular calcifications present. No  hydroureter or ureteral calculus. No hydronephrosis. Posterior nonspecific bladder wall thickening. Stomach/Bowel: Negative for bowel obstruction, significant dilatation, ileus, or free air. Normal appendix. Small amount of upper abdomen perihepatic and perisplenic ascites. Small amount of dependent pelvic ascites. No focal fluid collection or abscess. Vascular/Lymphatic: Limited without IV contrast. Abdominal atherosclerosis noted. Negative for aneurysm. No retroperitoneal hemorrhage or hematoma. No bulky adenopathy. Reproductive: No significant finding by CT. Other: Intact abdominal wall.  No hernia.  No inguinal abnormality. Musculoskeletal: Degenerative changes of the spine and facet joints. Old compression fracture at L4. Bones are osteopenic. IMPRESSION: Cardiomegaly. Trace pleural effusions and bibasilar reticulonodular/tree in bud mild opacities worse on the left. Difficult to exclude mild bibasilar bronchopneumonia. Small amount abdominopelvic ascites. Cholelithiasis Abdominal atherosclerosis No other acute intra-abdominal finding by noncontrast CT. Electronically Signed   By: Jerilynn Mages.  Shick M.D.   On: 07/24/2021 14:53   DG Chest 2 View  Result Date: 07/24/2021 CLINICAL DATA:  sob EXAM: CHEST - 2 VIEW COMPARISON:  July 05, 2017 FINDINGS: The cardiomediastinal silhouette is unchanged in contour.Status post median sternotomy and CABG. LEFT chest AICD. Small LEFT pleural effusion. No pneumothorax. Mild diffuse interstitial prominence. LEFT retrocardiac opacities. Visualized abdomen is unremarkable. Mild degenerative changes of the thoracic spine. IMPRESSION: Constellation of findings are favored to reflect mild pulmonary edema with LEFT basilar atelectasis and small LEFT pleural effusion. Electronically Signed   By: Valentino Saxon M.D.   On: 07/24/2021 10:48   ECHOCARDIOGRAM COMPLETE  Result Date: 07/25/2021    ECHOCARDIOGRAM REPORT   Patient Name:   BLAYKE CORDREY Date of Exam: 07/25/2021 Medical Rec  #:  [id number]      Height:       66.0 in Accession #:    [id number]     Weight:       141.1 lb Date of Birth:  12/21/1945       BSA:          1.724 m Patient Age:    78 years       BP:           140/60 mmHg Patient Gender: M              HR:           60 bpm. Exam Location:  [facility] Procedure: 2D Echo, Strain Analysis and 3D Echo Indications:     CHF I50.21  History:         Patient has no prior history of Echocardiogram examinations.  Sonographer:     Kathlen Brunswick RDCS Referring Phys:  Unknown Foley NIU Diagnosing Phys: Neoma Laming  Sonographer Comments: Global longitudinal strain was attempted. IMPRESSIONS  1. Left ventricular ejection fraction,  by estimation, is 20 to 25%. The left ventricle has severely decreased function. The left ventricle demonstrates global hypokinesis. The left ventricular internal cavity size was severely dilated. There is mild left ventricular hypertrophy. Left ventricular diastolic parameters are consistent with Grade III diastolic dysfunction (restrictive).  2. Right ventricular systolic function is severely reduced. The right ventricular size is severely enlarged. Mildly increased right ventricular wall thickness. There is mildly elevated pulmonary artery systolic pressure.  3. Left atrial size was severely dilated.  4. Right atrial size was severely dilated.  5. The mitral valve is abnormal. Moderate to severe mitral valve regurgitation.  6. Tricuspid valve regurgitation is severe.  7. The aortic valve is calcified. Aortic valve regurgitation is mild. Mild aortic valve sclerosis is present, with no evidence of aortic valve stenosis. Conclusion(s)/Recommendation(s): Findings consistent with ischemic cardiomyopathy. FINDINGS  Left Ventricle: Left ventricular ejection fraction, by estimation, is 20 to 25%. The left ventricle has severely decreased function. The left ventricle demonstrates global hypokinesis. Definity contrast agent was given IV to delineate the left ventricular endocardial  borders. The left ventricular internal cavity size was severely dilated. There is mild left ventricular hypertrophy. Left ventricular diastolic parameters are consistent with Grade III diastolic dysfunction (restrictive). Right Ventricle: The right ventricular size is severely enlarged. Mildly increased right ventricular wall thickness. Right ventricular systolic function is severely reduced. There is mildly elevated pulmonary artery systolic pressure. Left Atrium: Left atrial size was severely dilated. Right Atrium: Right atrial size was severely dilated. Pericardium: The pericardium was not well visualized. Mitral Valve: The mitral valve is abnormal. Moderate to severe mitral valve regurgitation. Tricuspid Valve: The tricuspid valve is grossly normal. Tricuspid valve regurgitation is severe. Aortic Valve: The aortic valve is calcified. Aortic valve regurgitation is mild. Aortic regurgitation PHT measures 621 msec. Mild aortic valve sclerosis is present, with no evidence of aortic valve stenosis. Aortic valve peak gradient measures 4.2 mmHg. Pulmonic Valve: The pulmonic valve was grossly normal. Pulmonic valve regurgitation is mild to moderate. Aorta: The aortic root, ascending aorta and aortic arch are all structurally normal, with no evidence of dilitation or obstruction. IAS/Shunts: The interatrial septum was not well visualized.  LEFT VENTRICLE PLAX 2D LVIDd:         6.09 cm      Diastology LVIDs:         5.65 cm      LV e' lateral:   8.16 cm/s LV PW:         0.76 cm      LV E/e' lateral: 11.3 LV IVS:        0.76 cm LVOT diam:     2.00 cm LV SV:         31 LV SV Index:   18 LVOT Area:     3.14 cm     3D Volume EF:                             3D EF:        24 %                             LV EDV:       159 ml LV Volumes (MOD)            LV ESV:       121 ml LV vol d, MOD A2C: 133.0 ml LV SV:  38 ml LV vol d, MOD A4C: 148.0 ml LV vol s, MOD A2C: 105.0 ml LV vol s, MOD A4C: 114.0 ml LV SV MOD A2C:     28.0  ml LV SV MOD A4C:     148.0 ml LV SV MOD BP:      32.7 ml RIGHT VENTRICLE RV Basal diam:  3.69 cm RV S prime:     8.27 cm/s TAPSE (M-mode): 1.2 cm LEFT ATRIUM             Index        RIGHT ATRIUM           Index LA diam:        5.10 cm 2.96 cm/m   RA Area:     30.00 cm LA Vol (A2C):   56.0 ml 32.48 ml/m  RA Volume:   118.00 ml 68.44 ml/m LA Vol (A4C):   98.0 ml 56.84 ml/m LA Biplane Vol: 79.1 ml 45.88 ml/m  AORTIC VALVE                 PULMONIC VALVE AV Area (Vmax): 1.57 cm     PV Vmax:          0.92 m/s AV Vmax:        103.00 cm/s  PV Peak grad:     3.4 mmHg AV Peak Grad:   4.2 mmHg     PR End Diast Vel: 4.49 msec LVOT Vmax:      51.60 cm/s LVOT Vmean:     31.100 cm/s LVOT VTI:       0.098 m AI PHT:         621 msec  AORTA Ao Root diam: 2.80 cm Ao Asc diam:  3.20 cm MITRAL VALVE                  TRICUSPID VALVE MV Area (PHT): 3.56 cm       TV Peak grad:   17.5 mmHg MV Decel Time: 213 msec       TV Vmax:        2.09 m/s MR Peak grad:    66.6 mmHg MR Mean grad:    41.0 mmHg    SHUNTS MR Vmax:         408.00 cm/s  Systemic VTI:  0.10 m MR Vmean:        299.0 cm/s   Systemic Diam: 2.00 cm MR PISA:         1.01 cm MR PISA Eff ROA: 8 mm MR PISA Radius:  0.40 cm MV E velocity: 92.50 cm/s MV A velocity: 21.40 cm/s MV E/A ratio:  4.32 Shaukat Khan Electronically signed by Neoma Laming Signature Date/Time: 07/25/2021/11:55:53 AM    Final         Scheduled Meds:  amiodarone  200 mg Oral Daily   atorvastatin  80 mg Oral Daily   ferrous sulfate  325 mg Oral Q breakfast   folic acid  1 mg Oral [id number]   insulin aspart  0-5 Units Subcutaneous QHS   insulin aspart  0-9 Units Subcutaneous TID WC   levothyroxine  50 mcg Oral Q0600   nicotine  14 mg Transdermal Daily   pantoprazole  40 mg Oral Daily   potassium chloride  40 mEq Oral Once   Rivaroxaban  15 mg Oral Q supper   Continuous Infusions:  furosemide (LASIX) 200 mg in dextrose 5% 100 mL (2mg /mL) infusion 8 mg/hr (07/25/21 2202)  LOS: 2 days     Time spent: 35 mins.More than 50% of that time was spent in counseling and/or coordination of care.      Shelly Coss, MD [facility] P11/02/2021, 7:39 AM

## 2021-07-26 NOTE — Progress Notes (Signed)
SUBJECTIVE:swelling improved   Vitals:   07/26/21 0050 07/26/21 0104 07/26/21 0407 07/26/21 0759  BP: 111/68  105/67 129/65  Pulse: (!) 59  (!) 58 (!) 59  Resp: 18  18 18   Temp: 98.6 F (37 C)  98.7 F (37.1 C) 97.7 F (36.5 C)  TempSrc: Oral     SpO2: 98%  100% 100%  Weight:  58 kg    Height:        Intake/Output Summary (Last 24 hours) at 07/26/2021 1103 Last data filed at 07/26/2021 1000 Gross per 24 hour  Intake 240 ml  Output 2375 ml  Net -2135 ml    LABS: Basic Metabolic Panel: Recent Labs    07/25/21 0834 07/26/21 0522  NA 137 137  K 3.3* 3.3*  CL 101 101  CO2 26 25  GLUCOSE 73 92  BUN 37* 35*  CREATININE 1.91* 1.71*  CALCIUM 8.3* 8.0*  MG 1.0* 1.9   Liver Function Tests: Recent Labs    07/25/21 0647 07/25/21 0834  AST 25 26  ALT 13 13  ALKPHOS 44 47  BILITOT 1.5* 1.7*  PROT 6.5 6.9  ALBUMIN 3.7 3.7   No results for input(s): LIPASE, AMYLASE in the last 72 hours. CBC: Recent Labs    07/25/21 1900 07/26/21 0522  WBC 4.7 3.6*  HGB 8.7* 7.4*  HCT 26.5* 22.9*  MCV 92.7 90.9  PLT 132* 83*   Cardiac Enzymes: No results for input(s): CKTOTAL, CKMB, CKMBINDEX, TROPONINI in the last 72 hours. BNP: Invalid input(s): POCBNP D-Dimer: Recent Labs    07/24/21 1011  DDIMER 3.38*   Hemoglobin A1C: No results for input(s): HGBA1C in the last 72 hours. Fasting Lipid Panel: No results for input(s): CHOL, HDL, LDLCALC, TRIG, CHOLHDL, LDLDIRECT in the last 72 hours. Thyroid Function Tests: No results for input(s): TSH, T4TOTAL, T3FREE, THYROIDAB in the last 72 hours.  Invalid input(s): FREET3 Anemia Panel: Recent Labs    07/24/21 1230  RETICCTPCT 1.4     PHYSICAL EXAM General: Well developed, well nourished, in no acute distress HEENT:  Normocephalic and atramatic Neck:  No JVD.  Lungs: Clear bilaterally to auscultation and percussion. Heart: HRRR . Normal S1 and S2 without gallops or murmurs.  Abdomen: Bowel sounds are positive,  abdomen soft and non-tender  Msk:  Back normal, normal gait. Normal strength and tone for age. Extremities: No clubbing, cyanosis or edema.   Neuro: Alert and oriented X 3. Psych:  Good affect, responds appropriately  TELEMETRY:nsr  ASSESSMENT AND PLAN: HErEF, swelling getting bteter in legs.  Principal Problem:   Acute on chronic systolic CHF (congestive heart failure) (HCC) Active Problems:   HTN (hypertension)   CAD (coronary artery disease)   Iron deficiency anemia   Atrial fibrillation, chronic (HCC)   H/O: stroke   Hematuria   Acute renal failure superimposed on stage 3a chronic kidney disease (HCC)   Thrombocytopenia (HCC)   HLD (hyperlipidemia)   Type II diabetes mellitus with renal manifestations ([place])   Hypothyroid    Walter Blair A, MD, [facility] 07/26/2021 11:03 AM

## 2021-07-26 NOTE — Plan of Care (Signed)
  Problem: Education: Goal: Knowledge of General Education information will improve Description: Including pain rating scale, medication(s)/side effects and non-pharmacologic comfort measures Outcome: Completed/Met   Problem: Health Behavior/Discharge Planning: Goal: Ability to manage health-related needs will improve Outcome: Completed/Met   Problem: Clinical Measurements: Goal: Ability to maintain clinical measurements within normal limits will improve Outcome: Completed/Met Goal: Will remain free from infection Outcome: Completed/Met Goal: Diagnostic test results will improve Outcome: Progressing Goal: Respiratory complications will improve Outcome: Completed/Met Goal: Cardiovascular complication will be avoided Outcome: Completed/Met   Problem: Clinical Measurements: Goal: Will remain free from infection Outcome: Completed/Met   Problem: Clinical Measurements: Goal: Diagnostic test results will improve Outcome: Progressing   Problem: Clinical Measurements: Goal: Respiratory complications will improve Outcome: Completed/Met   Problem: Clinical Measurements: Goal: Cardiovascular complication will be avoided Outcome: Completed/Met   Problem: Clinical Measurements: Goal: Ability to maintain clinical measurements within normal limits will improve Outcome: Completed/Met

## 2021-07-26 NOTE — Progress Notes (Signed)
Walter Blair  MRN: [id number]  DOB/AGE: 1946-06-22 75 y.o.  Primary Care Physician:Tejan-Sie, Brandt Loosen, MD  Admit date: 07/24/2021  Chief Complaint:  Chief Complaint  Patient presents with   Shortness of Breath    S-Pt presented on  07/24/2021 with  Chief Complaint  Patient presents with   Shortness of Breath  . Patient was seen today on second floor Patient resting comfortably in the bed. Patient main complaint in today visit was that he was feeling better than yesterday  Medications   amiodarone  200 mg Oral Daily   atorvastatin  80 mg Oral Daily   ferrous sulfate  325 mg Oral Q breakfast   folic acid  1 mg Oral [id number]   insulin aspart  0-5 Units Subcutaneous QHS   insulin aspart  0-9 Units Subcutaneous TID WC   levothyroxine  50 mcg Oral Q0600   nicotine  14 mg Transdermal Daily   pantoprazole  40 mg Oral Daily   potassium chloride  40 mEq Oral Once   Rivaroxaban  15 mg Oral Q supper         TIW:PYKDX from the symptoms mentioned above,there are no other symptoms referable to all systems reviewed.  Physical Exam: Vital signs in last 24 hours: Temp:  [98.6 F (37 C)-98.7 F (37.1 C)] 98.7 F (37.1 C) (11/06 0407) Pulse Rate:  [58-88] 58 (11/06 0407) Resp:  [12-18] 18 (11/06 0407) BP: (95-131)/(55-73) 105/67 (11/06 0407) SpO2:  [96 %-100 %] 100 % (11/06 0407) Weight:  [58 kg-58.7 kg] 58 kg (11/06 0104) Weight change: -5.305 kg Last BM Date: 07/25/21  Intake/Output from previous day: 11/05 0701 - 11/06 0700 In: -  Out: 1875 [Urine:1875] No intake/output data recorded.   Physical Exam:  General- pt is awake,alert, oriented to time place and person  Resp- No acute REsp distress, decreased at bases  CVS- S1S2 regular in rate and rhythm  GIT- BS+, soft, Non tender , Non distended  EXT- 2+  LE Edema,  No Cyanosis    Lab Results:  CBC  Recent Labs    07/25/21 1900 07/26/21 0522  WBC 4.7 3.6*  HGB 8.7* 7.4*  HCT 26.5* 22.9*  PLT 132*  83*    BMET  Recent Labs    07/25/21 0834 07/26/21 0522  NA 137 137  K 3.3* 3.3*  CL 101 101  CO2 26 25  GLUCOSE 73 92  BUN 37* 35*  CREATININE 1.91* 1.71*  CALCIUM 8.3* 8.0*      Most recent Creatinine trend  Lab Results  Component Value Date   CREATININE 1.71 (H) 07/26/2021   CREATININE 1.91 (H) 07/25/2021   CREATININE 1.84 (H) 07/25/2021      MICRO   Recent Results (from the past 240 hour(s))  Microscopic Examination     Status: Abnormal   Collection Time: 07/16/21  8:46 AM   Urine  Result Value Ref Range Status   WBC, UA 0-5 0 - 5 /hpf Final   RBC >30 (A) 0 - 2 /hpf Final   Epithelial Cells (non renal) 0-10 0 - 10 /hpf Final   Casts Present (A) None seen /lpf Final   Cast Type Hyaline casts N/A Final   Bacteria, UA Few None seen/Few Final  Resp Panel by RT-PCR (Flu A&B, Covid) Nasopharyngeal Swab     Status: None   Collection Time: 07/24/21 12:32 PM   Specimen: Nasopharyngeal Swab; Nasopharyngeal(NP) swabs in vial transport medium  Result Value Ref Range Status   SARS  Coronavirus 2 by RT PCR NEGATIVE NEGATIVE Final    Comment: (NOTE) SARS-CoV-2 target nucleic acids are NOT DETECTED.  The SARS-CoV-2 RNA is generally detectable in upper respiratory specimens during the acute phase of infection. The lowest concentration of SARS-CoV-2 viral copies this assay can detect is 138 copies/mL. A negative result does not preclude SARS-Cov-2 infection and should not be used as the sole basis for treatment or other patient management decisions. A negative result may occur with  improper specimen collection/handling, submission of specimen other than nasopharyngeal swab, presence of viral mutation(s) within the areas targeted by this assay, and inadequate number of viral copies(<138 copies/mL). A negative result must be combined with clinical observations, patient history, and epidemiological information. The expected result is Negative.  Fact Sheet for Patients:   EntrepreneurPulse.com.au  Fact Sheet for Healthcare Providers:  IncredibleEmployment.be  This test is no t yet approved or cleared by the [place] FDA and  has been authorized for detection and/or diagnosis of SARS-CoV-2 by FDA under an Emergency Use Authorization (EUA). This EUA will remain  in effect (meaning this test can be used) for the duration of the COVID-19 declaration under Section 564(b)(1) of the Act, 21 U.S.C.section 360bbb-3(b)(1), unless the authorization is terminated  or revoked sooner.       Influenza A by PCR NEGATIVE NEGATIVE Final   Influenza B by PCR NEGATIVE NEGATIVE Final    Comment: (NOTE) The Xpert Xpress SARS-CoV-2/FLU/RSV plus assay is intended as an aid in the diagnosis of influenza from Nasopharyngeal swab specimens and should not be used as a sole basis for treatment. Nasal washings and aspirates are unacceptable for Xpert Xpress SARS-CoV-2/FLU/RSV testing.  Fact Sheet for Patients: EntrepreneurPulse.com.au  Fact Sheet for Healthcare Providers: IncredibleEmployment.be  This test is not yet approved or cleared by the [place] FDA and has been authorized for detection and/or diagnosis of SARS-CoV-2 by FDA under an Emergency Use Authorization (EUA). This EUA will remain in effect (meaning this test can be used) for the duration of the COVID-19 declaration under Section 564(b)(1) of the Act, 21 U.S.C. section 360bbb-3(b)(1), unless the authorization is terminated or revoked.  Performed at [facility], [address], [place], [place] [address]          Impression:  Walter Blair is a 75 y.o. male with medical problems of      was admitted on 07/24/2021 for :   Acute on chronic systolic CHF (congestive heart failure) ([place]) [I50.23]   1 AKI 2 CKD st 3 A. Baseline Creatinine of 1.4/GFR 49 from 01/05/2021 3 Gross hematuria 4 Hypotension 5 Acute  Exacerbation of chronic systolic CHF. (EF 12-87 % per chart) 6 Thrombocytopenia   Cause for AKI is not entirely clear but may be related to cardiorenal syndrome and abnormal hemodynamics   1)Renal    AKI Patient has AKI secondary to ATN Data in favor of ATN is Hypotension-patient had low blood pressure at time of presentation -Is on RAS blockers as an outpatient Patient AKI could also be from cardiorenal syndrome as patient has EF of around 15% Patient creatinine is currently stable Patient has AKI on CKD And that in favor of CKD as patient has a creatinine of 1.4 going back to April of this year  AKI is better  Creatinine is trending down   2)Hypotension Patient blood pressure is now stable  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  Patient's [place] test was negative  CBC Latest Ref Rng &  Units 07/26/2021 07/25/2021 07/25/2021  WBC 4.0 - 10.5 K/uL 3.6(L) 4.7 3.3(L)  Hemoglobin 13.0 - 17.0 g/dL 7.4(L) 8.7(L) 7.6(L)  Hematocrit 39.0 - 52.0 % 22.9(L) 26.5(L) 23.7(L)  Platelets 150 - 400 K/uL 83(L) 132(L) 82(L)    Hematologist following 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      4) Hematuria Patient UA does show gross hematuria Patient has had this issue going back to 2018 Patient has had outpatient urology scheduled for cystoscopy and further work-up   5)Acute on chronic systolic CHF Patient is currently on diuretics/IV Lasix drip at 8 mg/h Patient is negative by 3 L Cardiology is following   6) Electrolytes   BMP Latest Ref Rng & Units 07/26/2021 07/25/2021 07/25/2021  Glucose 70 - 99 mg/dL 92 73 69(L)  BUN 8 - 23 mg/dL 35(H) 37(H) 39(H)  Creatinine 0.61 - 1.24 mg/dL 1.71(H) 1.91(H) 1.84(H)  Sodium 135 - 145 mmol/L 137 137 137  Potassium 3.5 - 5.1 mmol/L 3.3(L) 3.3(L) 3.1(L)  Chloride 98 - 111 mmol/L 101 101 102  CO2 22 - 32 mmol/L 25 26 25   Calcium 8.9 - 10.3 mg/dL 8.0(L) 8.3(L) 8.2(L)      Sodium Normonatremic   Potassium Pt has Hypokalemia sec to IV Diuresis  Potassium is being repleted     7)Acid base  Co2 at goal     Plan:  We will continue patient on current IV diuresis       Mohini Heathcock s [facility] 07/26/2021, 7:44 AM

## 2021-07-27 DIAGNOSIS — I5023 Acute on chronic systolic (congestive) heart failure: Secondary | ICD-10-CM | POA: Diagnosis not present

## 2021-07-27 LAB — PREPARE RBC (CROSSMATCH)

## 2021-07-27 LAB — CBC
HCT: 22.3 % — ABNORMAL LOW (ref 39.0–52.0)
Hemoglobin: 7.2 g/dL — ABNORMAL LOW (ref 13.0–17.0)
MCH: 29.3 pg (ref 26.0–34.0)
MCHC: 32.3 g/dL (ref 30.0–36.0)
MCV: 90.7 fL (ref 80.0–100.0)
Platelets: 93 10*3/uL — ABNORMAL LOW (ref 150–400)
RBC: 2.46 MIL/uL — ABNORMAL LOW (ref 4.22–5.81)
RDW: 16.2 % — ABNORMAL HIGH (ref 11.5–15.5)
WBC: 4.1 10*3/uL (ref 4.0–10.5)
nRBC: 0 % (ref 0.0–0.2)

## 2021-07-27 LAB — BASIC METABOLIC PANEL
Anion gap: 4 — ABNORMAL LOW (ref 5–15)
BUN: 30 mg/dL — ABNORMAL HIGH (ref 8–23)
CO2: 28 mmol/L (ref 22–32)
Calcium: 8.8 mg/dL — ABNORMAL LOW (ref 8.9–10.3)
Chloride: 104 mmol/L (ref 98–111)
Creatinine, Ser: 1.66 mg/dL — ABNORMAL HIGH (ref 0.61–1.24)
GFR, Estimated: 42 mL/min — ABNORMAL LOW (ref >60)
Glucose, Bld: 104 mg/dL — ABNORMAL HIGH (ref 70–99)
Potassium: 3.8 mmol/L (ref 3.5–5.1)
Sodium: 136 mmol/L (ref 135–145)

## 2021-07-27 LAB — GLUCOSE, CAPILLARY
Glucose-Capillary: 108 mg/dL — ABNORMAL HIGH (ref 70–99)
Glucose-Capillary: 170 mg/dL — ABNORMAL HIGH (ref 70–99)
Glucose-Capillary: 125 mg/dL — ABNORMAL HIGH (ref 70–99)
Glucose-Capillary: 192 mg/dL — ABNORMAL HIGH (ref 70–99)

## 2021-07-27 LAB — ABO/RH: ABO/RH(D): A POS

## 2021-07-27 MED ORDER — TORSEMIDE 20 MG PO TABS
20.0000 mg | ORAL_TABLET | Freq: Two times a day (BID) | ORAL | Status: DC
  Administered 2021-07-27: 20 mg via ORAL
  Filled 2021-07-27: qty 1

## 2021-07-27 MED ORDER — TORSEMIDE 20 MG PO TABS
40.0000 mg | ORAL_TABLET | Freq: Two times a day (BID) | ORAL | Status: DC
  Administered 2021-07-27 – 2021-07-28 (×2): 40 mg via ORAL
  Filled 2021-07-27 (×2): qty 2

## 2021-07-27 MED ORDER — SODIUM CHLORIDE 0.9% IV SOLUTION: Freq: Once | INTRAVENOUS | Status: AC

## 2021-07-27 NOTE — TOC Initial Note (Signed)
Transition of Care [facility]) - Initial/Assessment Note    Patient Details  Name: Walter Blair MRN: [id number] Date of Birth: [birth year]  [facility] CM/SW Contact:    Eileen Stanford, LCSW Phone Number: 07/27/2021, 3:21 PM  Clinical Narrative:  CSW spoke with pt's brother. Pt's brother states pt lives with him. Pt's brother is agreeable for pt to get hh with no agency preference. CSW reached out to [facility] and they will service pt. Pt's brother states they have a walker at home.                 Expected Discharge Plan: [place] Barriers to Discharge: Continued Medical Work up   Patient Goals and CMS Choice Patient states their goals for this hospitalization and ongoing recovery are:: to get home   Choice offered to / list presented to : Sibling  Expected Discharge Plan and Services Expected Discharge Plan: [place] In-house Referral: Clinical Social Work   Post Acute Care Choice: [place] arrangements for the past 2 months: [place]: RN, PT [facility] Agency: [place] ([facility]) Date HH Agency Contacted: 07/27/21 Time [place]: 1520 Representative spoke with at [place] Arrangements/Services Living arrangements for the past 2 months: [place] with:: Siblings Patient language and need for interpreter reviewed:: Yes Do you feel safe going back to the place where you live?: Yes      Need for Family Participation in Patient Care: Yes (Comment) Care giver support system in place?: Yes (comment)   Criminal Activity/Legal Involvement Pertinent to Current Situation/Hospitalization: No - Comment as needed  Activities of Daily Living Home Assistive Devices/Equipment: CBG Meter ADL Screening (condition at time of admission) Patient's cognitive ability adequate to safely complete daily activities?: Yes Is the patient  deaf or have difficulty hearing?: No Does the patient have difficulty seeing, even when wearing glasses/contacts?: No Does the patient have difficulty concentrating, remembering, or making decisions?: No Patient able to express need for assistance with ADLs?: Yes Does the patient have difficulty dressing or bathing?: No Independently performs ADLs?: Yes (appropriate for developmental age) Does the patient have difficulty walking or climbing stairs?: No Weakness of Legs: None Weakness of Arms/Hands: None  Permission Sought/Granted Permission sought to share information with : Family Supports Permission granted to share information with : Yes, Release of Information Signed  Share Information with NAME: Warden Fillers     Permission granted to share info w Relationship: brother     Emotional Assessment Appearance:: Appears stated age Attitude/Demeanor/Rapport: Unable to Assess Affect (typically observed): Unable to Assess Orientation: : Oriented to Self, Oriented to Place, Oriented to  Time, Oriented to Situation Alcohol / Substance Use: Not Applicable Psych Involvement: No (comment)  Admission diagnosis:  Shortness of breath [R06.02] Acute on chronic systolic CHF (congestive heart failure) (HCC) [I50.23] Acute on chronic congestive heart failure, unspecified heart failure type [facility]) [I50.9] Patient Active Problem List   Diagnosis Date Noted   Acute on chronic systolic CHF (congestive heart failure) (HCC) 07/24/2021   Atrial fibrillation, chronic ([place]) 07/24/2021   H/O: stroke    Hematuria    Acute renal failure superimposed on stage 3a chronic kidney disease (HCC)    Thrombocytopenia (HCC)    HLD (hyperlipidemia)  Type II diabetes mellitus with renal manifestations (HCC)    Hypothyroid    Iron deficiency anemia 01/27/2021   Mixed hyperlipidemia 02/06/2020   Grief 07/04/2017   Dizziness    SOB (shortness of breath)    Encounter for central line placement    New onset atrial  fibrillation ([place]) 06/27/2017   Diabetes ([place]) 06/27/2017   HTN (hypertension) 06/27/2017   CAD (coronary artery disease) 06/27/2017   PCP:  Jodi Marble, MD Pharmacy:   [facility] [place], [place] - [place] AT [place] [address] [place] [address] Phone: [phone] Fax: [phone]     Social Determinants of Health (SDOH) Interventions    Readmission Risk Interventions No flowsheet data found.

## 2021-07-27 NOTE — Progress Notes (Signed)
[place]  [phone]) [phone] Fax:(336) [phone]  ID: [name] OB: 1945/11/25  MR#: [id number]  ENI#:[id number]  Patient Care Team: Jodi Marble, MD as PCP - General (Internal Medicine)  CHIEF COMPLAINT: Anemia and thrombocytopenia.  INTERVAL HISTORY: Patient feels significantly improved with near resolution of his edema.  Hemoglobin and platelets remain essentially unchanged.  Offers no further specific complaints.  Patient is anxious for discharge.  REVIEW OF SYSTEMS:   Review of Systems  Constitutional: Negative.  Negative for fever, malaise/fatigue and weight loss.  Respiratory: Negative.  Negative for cough, hemoptysis and shortness of breath.   Cardiovascular: Negative.  Negative for chest pain and leg swelling.  Gastrointestinal: Negative.  Negative for abdominal pain.  Genitourinary: Negative.  Negative for dysuria and hematuria.  Musculoskeletal: Negative.  Negative for back pain.  Skin: Negative.  Negative for rash.  Neurological: Negative.  Negative for dizziness, focal weakness, weakness and headaches.  Psychiatric/Behavioral: Negative.  The patient is not nervous/anxious.    As per HPI. Otherwise, a complete review of systems is negative.  PAST MEDICAL HISTORY: Past Medical History:  Diagnosis Date   CAD (coronary artery disease)    Diabetes ([place])    H/O: stroke    HTN (hypertension)     PAST SURGICAL HISTORY: Past Surgical History:  Procedure Laterality Date   CARDIAC DEFIBRILLATOR PLACEMENT     CARDIOVERSION N/A 07/01/2017   Procedure: CARDIOVERSION;  Surgeon: Dionisio David, MD;  Location: [facility];  Service: Cardiovascular;  Laterality: N/A;   CARDIOVERSION N/A 07/05/2017   Procedure: CARDIOVERSION;  Surgeon: Dionisio David, MD;  Location: [facility];  Service: Cardiovascular;  Laterality: N/A;   COLONOSCOPY WITH PROPOFOL N/A 12/30/2020   Procedure: COLONOSCOPY WITH PROPOFOL;  Surgeon: [name], MD;  Location: [facility]  ENDOSCOPY;  Service: Gastroenterology;  Laterality: N/A;   CORONARY ARTERY BYPASS GRAFT     ESOPHAGOGASTRODUODENOSCOPY  12/30/2020   Procedure: ESOPHAGOGASTRODUODENOSCOPY (EGD);  Surgeon: [name], MD;  Location: [facility] ENDOSCOPY;  Service: Gastroenterology;;   GIVENS CAPSULE STUDY N/A 02/02/2021   Procedure: GIVENS CAPSULE STUDY;  Surgeon: [name], MD;  Location: [facility] ENDOSCOPY;  Service: Gastroenterology;  Laterality: N/A;  WILL NEED INTERPRETER ON WHEELS;  BROTHER WANTS TO TRANSLATE   TEE WITHOUT CARDIOVERSION N/A 07/01/2017   Procedure: TRANSESOPHAGEAL ECHOCARDIOGRAM (TEE);  Surgeon: Dionisio David, MD;  Location: [facility];  Service: Cardiovascular;  Laterality: N/A;   TEE WITHOUT CARDIOVERSION N/A 07/05/2017   Procedure: TRANSESOPHAGEAL ECHOCARDIOGRAM (TEE);  Surgeon: Dionisio David, MD;  Location: [facility];  Service: Cardiovascular;  Laterality: N/A;    FAMILY HISTORY: Family History  Problem Relation Age of Onset   Diabetes Brother    Hypertension Mother    Diabetes Sister     ADVANCED DIRECTIVES (Y/N):  _0 @  HEALTH MAINTENANCE: Social History   Tobacco Use   Smoking status: Former    Types: Cigarettes   Smokeless tobacco: Former    Types: Chew  Substance Use Topics   Alcohol use: No   Drug use: No     Colonoscopy:  PAP:  Bone density:  Lipid panel:  Allergies  Allergen Reactions   Ativan [Lorazepam] Shortness Of Breath and Other (See Comments)    Pt experienced adverse reaction and was transferred to ICU last time they were given Med    Current Facility-Administered Medications  Medication Dose Route Frequency Provider Last Rate Last Admin   0.9 %  sodium chloride infusion (Manually program via Guardrails IV Fluids)   Intravenous  Once Shelly Coss, MD       acetaminophen (TYLENOL) tablet 650 mg  650 mg Oral Q6H PRN Ivor Costa, MD       albuterol (PROVENTIL) (2.5 MG/3ML) 0.083% nebulizer solution 3 mL  3 mL Nebulization Q4H PRN Ivor Costa, MD        amiodarone (PACERONE) tablet 200 mg  200 mg Oral Daily Ivor Costa, MD   200 mg at 07/26/21 0947   atorvastatin (LIPITOR) tablet 80 mg  80 mg Oral Daily Ivor Costa, MD   80 mg at 07/26/21 0946   dextromethorphan-guaiFENesin ([place] DM) 30-600 MG per 12 hr tablet 1 tablet  1 tablet Oral BID PRN Ivor Costa, MD       ferrous sulfate tablet 325 mg  325 mg Oral Q breakfast Ivor Costa, MD   325 mg at 82/95/62 1308   folic acid (FOLVITE) tablet 1 mg  1 mg Oral Jaye Beagle, Soledad Gerlach, MD   1 mg at 07/27/21 6578   furosemide (LASIX) 200 mg in dextrose 5 % 100 mL (2 mg/mL) infusion  8 mg/hr Intravenous Continuous Ivor Costa, MD 4 mL/hr at 07/26/21 1445 8 mg/hr at 07/26/21 1445   insulin aspart (novoLOG) injection 0-5 Units  0-5 Units Subcutaneous QHS Ivor Costa, MD       insulin aspart (novoLOG) injection 0-9 Units  0-9 Units Subcutaneous TID WC Ivor Costa, MD   2 Units at 07/26/21 1658   levothyroxine (SYNTHROID) tablet 50 mcg  50 mcg Oral Q0600 Ivor Costa, MD   50 mcg at 07/27/21 4696   nicotine (NICODERM CQ - dosed in mg/24 hours) patch 14 mg  14 mg Transdermal Daily Shelly Coss, MD   14 mg at 07/26/21 0952   ondansetron (ZOFRAN) injection 4 mg  4 mg Intravenous Q8H PRN Ivor Costa, MD       pantoprazole (PROTONIX) EC tablet 40 mg  40 mg Oral Daily Adhikari, Amrit, MD   40 mg at 07/26/21 0947   polyethylene glycol (MIRALAX / GLYCOLAX) packet 17 g  17 g Oral Daily PRN Ivor Costa, MD        OBJECTIVE: Vitals:   07/27/21 0505 07/27/21 0801  BP: 99/61 108/63  Pulse: 62 60  Resp: 18 18  Temp: 98 F (36.7 C) 97.8 F (36.6 C)  SpO2: 100% 100%     Body mass index is 20.64 kg/m.    ECOG FS:1 - Symptomatic but completely ambulatory  General: Well-developed, well-nourished, no acute distress. Eyes: Pink conjunctiva, anicteric sclera. HEENT: Normocephalic, moist mucous membranes. Lungs: No audible wheezing or coughing. Heart: Regular rate and rhythm. Abdomen: Soft, nontender, no obvious  distention. Musculoskeletal: No edema, cyanosis, or clubbing. Neuro: Alert, answering all questions appropriately. Cranial nerves grossly intact. Skin: No rashes or petechiae noted. Psych: Normal affect.  LAB RESULTS:  Lab Results  Component Value Date   NA 136 07/27/2021   K 3.8 07/27/2021   CL 104 07/27/2021   CO2 28 07/27/2021   GLUCOSE 104 (H) 07/27/2021   BUN 30 (H) 07/27/2021   CREATININE 1.66 (H) 07/27/2021   CALCIUM 8.8 (L) 07/27/2021   PROT 6.9 07/25/2021   ALBUMIN 3.7 07/25/2021   AST 26 07/25/2021   ALT 13 07/25/2021   ALKPHOS 47 07/25/2021   BILITOT 1.7 (H) 07/25/2021   GFRNONAA 42 (L) 07/27/2021   GFRAA >60 07/04/2017    Lab Results  Component Value Date   WBC 4.1 07/27/2021   NEUTROABS 2.8 12/16/2020   HGB 7.2 (L) 07/27/2021  HCT 22.3 (L) 07/27/2021   MCV 90.7 07/27/2021   PLT 93 (L) 07/27/2021     STUDIES: CT ABDOMEN PELVIS WO CONTRAST  Result Date: 07/24/2021 CLINICAL DATA:  Abdominal distension, shortness of breath, retained fluid EXAM: CT ABDOMEN AND PELVIS WITHOUT CONTRAST TECHNIQUE: Multidetector CT imaging of the abdomen and pelvis was performed following the standard protocol without IV contrast. COMPARISON:  None. FINDINGS: Lower chest: Cardiomegaly without pericardial effusion. Pacer wires in the right heart. Previous median sternotomy. Native coronary atherosclerosis present. Trace pleural effusions. Mild bibasilar reticulonodular and peripheral tree in bud opacities worse on the left may represent mild bibasilar pneumonitis/pneumonia. Left lower lobe mild cylindrical bronchiectasis noted. Nonspecific nodular pleural thickening on the left. This is incompletely evaluated by abdominal imaging only. Hepatobiliary: Limited without IV contrast. Distended hepatic IVC and hepatic veins compatible with right heart failure. Peri portal edema suspected. No biliary obstruction pattern or large focal hepatic abnormality. Small layering calcified gallstones  noted. Gallbladder nondistended. Common bile duct nondilated. Pancreas: Unremarkable. No pancreatic ductal dilatation or surrounding inflammatory changes. Spleen: Normal in size without focal abnormality. Adrenals/Urinary Tract: Nonspecific adrenal thickening bilaterally. Limited evaluation without contrast. No renal obstruction pattern. Renal vascular calcifications present. No hydroureter or ureteral calculus. No hydronephrosis. Posterior nonspecific bladder wall thickening. Stomach/Bowel: Negative for bowel obstruction, significant dilatation, ileus, or free air. Normal appendix. Small amount of upper abdomen perihepatic and perisplenic ascites. Small amount of dependent pelvic ascites. No focal fluid collection or abscess. Vascular/Lymphatic: Limited without IV contrast. Abdominal atherosclerosis noted. Negative for aneurysm. No retroperitoneal hemorrhage or hematoma. No bulky adenopathy. Reproductive: No significant finding by CT. Other: Intact abdominal wall.  No hernia.  No inguinal abnormality. Musculoskeletal: Degenerative changes of the spine and facet joints. Old compression fracture at L4. Bones are osteopenic. IMPRESSION: Cardiomegaly. Trace pleural effusions and bibasilar reticulonodular/tree in bud mild opacities worse on the left. Difficult to exclude mild bibasilar bronchopneumonia. Small amount abdominopelvic ascites. Cholelithiasis Abdominal atherosclerosis No other acute intra-abdominal finding by noncontrast CT. Electronically Signed   By: Jerilynn Mages.  Shick M.D.   On: 07/24/2021 14:53   DG Chest 2 View  Result Date: 07/24/2021 CLINICAL DATA:  sob EXAM: CHEST - 2 VIEW COMPARISON:  July 05, 2017 FINDINGS: The cardiomediastinal silhouette is unchanged in contour.Status post median sternotomy and CABG. LEFT chest AICD. Small LEFT pleural effusion. No pneumothorax. Mild diffuse interstitial prominence. LEFT retrocardiac opacities. Visualized abdomen is unremarkable. Mild degenerative changes of the  thoracic spine. IMPRESSION: Constellation of findings are favored to reflect mild pulmonary edema with LEFT basilar atelectasis and small LEFT pleural effusion. Electronically Signed   By: Valentino Saxon M.D.   On: 07/24/2021 10:48   ECHOCARDIOGRAM COMPLETE  Result Date: 07/25/2021    ECHOCARDIOGRAM REPORT   Patient Name:   ADONYS WILDES Date of Exam: 07/25/2021 Medical Rec #:  [id number]      Height:       66.0 in Accession #:    [id number]     Weight:       141.1 lb Date of Birth:  30-May-1946       BSA:          1.724 m Patient Age:    4 years       BP:           140/60 mmHg Patient Gender: M              HR:           60 bpm. Exam Location:  [facility]  Procedure: 2D Echo, Strain Analysis and 3D Echo Indications:     CHF I50.21  History:         Patient has no prior history of Echocardiogram examinations.  Sonographer:     Kathlen Brunswick RDCS Referring Phys:  Unknown Foley NIU Diagnosing Phys: Neoma Laming  Sonographer Comments: Global longitudinal strain was attempted. IMPRESSIONS  1. Left ventricular ejection fraction, by estimation, is 20 to 25%. The left ventricle has severely decreased function. The left ventricle demonstrates global hypokinesis. The left ventricular internal cavity size was severely dilated. There is mild left ventricular hypertrophy. Left ventricular diastolic parameters are consistent with Grade III diastolic dysfunction (restrictive).  2. Right ventricular systolic function is severely reduced. The right ventricular size is severely enlarged. Mildly increased right ventricular wall thickness. There is mildly elevated pulmonary artery systolic pressure.  3. Left atrial size was severely dilated.  4. Right atrial size was severely dilated.  5. The mitral valve is abnormal. Moderate to severe mitral valve regurgitation.  6. Tricuspid valve regurgitation is severe.  7. The aortic valve is calcified. Aortic valve regurgitation is mild. Mild aortic valve sclerosis is present, with no evidence  of aortic valve stenosis. Conclusion(s)/Recommendation(s): Findings consistent with ischemic cardiomyopathy. FINDINGS  Left Ventricle: Left ventricular ejection fraction, by estimation, is 20 to 25%. The left ventricle has severely decreased function. The left ventricle demonstrates global hypokinesis. Definity contrast agent was given IV to delineate the left ventricular endocardial borders. The left ventricular internal cavity size was severely dilated. There is mild left ventricular hypertrophy. Left ventricular diastolic parameters are consistent with Grade III diastolic dysfunction (restrictive). Right Ventricle: The right ventricular size is severely enlarged. Mildly increased right ventricular wall thickness. Right ventricular systolic function is severely reduced. There is mildly elevated pulmonary artery systolic pressure. Left Atrium: Left atrial size was severely dilated. Right Atrium: Right atrial size was severely dilated. Pericardium: The pericardium was not well visualized. Mitral Valve: The mitral valve is abnormal. Moderate to severe mitral valve regurgitation. Tricuspid Valve: The tricuspid valve is grossly normal. Tricuspid valve regurgitation is severe. Aortic Valve: The aortic valve is calcified. Aortic valve regurgitation is mild. Aortic regurgitation PHT measures 621 msec. Mild aortic valve sclerosis is present, with no evidence of aortic valve stenosis. Aortic valve peak gradient measures 4.2 mmHg. Pulmonic Valve: The pulmonic valve was grossly normal. Pulmonic valve regurgitation is mild to moderate. Aorta: The aortic root, ascending aorta and aortic arch are all structurally normal, with no evidence of dilitation or obstruction. IAS/Shunts: The interatrial septum was not well visualized.  LEFT VENTRICLE PLAX 2D LVIDd:         6.09 cm      Diastology LVIDs:         5.65 cm      LV e' lateral:   8.16 cm/s LV PW:         0.76 cm      LV E/e' lateral: 11.3 LV IVS:        0.76 cm LVOT diam:      2.00 cm LV SV:         31 LV SV Index:   18 LVOT Area:     3.14 cm     3D Volume EF:                             3D EF:        24 %  LV EDV:       159 ml LV Volumes (MOD)            LV ESV:       121 ml LV vol d, MOD A2C: 133.0 ml LV SV:        38 ml LV vol d, MOD A4C: 148.0 ml LV vol s, MOD A2C: 105.0 ml LV vol s, MOD A4C: 114.0 ml LV SV MOD A2C:     28.0 ml LV SV MOD A4C:     148.0 ml LV SV MOD BP:      32.7 ml RIGHT VENTRICLE RV Basal diam:  3.69 cm RV S prime:     8.27 cm/s TAPSE (M-mode): 1.2 cm LEFT ATRIUM             Index        RIGHT ATRIUM           Index LA diam:        5.10 cm 2.96 cm/m   RA Area:     30.00 cm LA Vol (A2C):   56.0 ml 32.48 ml/m  RA Volume:   118.00 ml 68.44 ml/m LA Vol (A4C):   98.0 ml 56.84 ml/m LA Biplane Vol: 79.1 ml 45.88 ml/m  AORTIC VALVE                 PULMONIC VALVE AV Area (Vmax): 1.57 cm     PV Vmax:          0.92 m/s AV Vmax:        103.00 cm/s  PV Peak grad:     3.4 mmHg AV Peak Grad:   4.2 mmHg     PR End Diast Vel: 4.49 msec LVOT Vmax:      51.60 cm/s LVOT Vmean:     31.100 cm/s LVOT VTI:       0.098 m AI PHT:         621 msec  AORTA Ao Root diam: 2.80 cm Ao Asc diam:  3.20 cm MITRAL VALVE                  TRICUSPID VALVE MV Area (PHT): 3.56 cm       TV Peak grad:   17.5 mmHg MV Decel Time: 213 msec       TV Vmax:        2.09 m/s MR Peak grad:    66.6 mmHg MR Mean grad:    41.0 mmHg    SHUNTS MR Vmax:         408.00 cm/s  Systemic VTI:  0.10 m MR Vmean:        299.0 cm/s   Systemic Diam: 2.00 cm MR PISA:         1.01 cm MR PISA Eff ROA: 8 mm MR PISA Radius:  0.40 cm MV E velocity: 92.50 cm/s MV A velocity: 21.40 cm/s MV E/A ratio:  4.32 [employer] signed by Neoma Laming Signature Date/Time: 07/25/2021/11:55:53 AM    Final     ASSESSMENT: Anemia and thrombocytopenia.  PLAN:    Anemia: Upon admission patient's hemoglobin was 7.9 and is trended down slightly to 7.3.  Today's result is stable at 7.2.  His most recent  hemoglobin prior to admission in June 2022 was reported 10.2.  Although LDH and total bilirubin are mildly elevated, haptoglobin and Coombs test are negative.  Schistocytes seen on peripheral smear last week possibly related to underlying DIC and not hemolysis.  ADAMTS13 is negative essentially ruling out TTP.  Full anemia work-up can be accomplished as an outpatient including possible bone marrow biopsy.  No further intervention is needed.  Will arrange follow-up in the cancer center 1 to 2 weeks after discharge.    Thrombocytopenia: Chronic and unchanged.  Patient's platelet count is 93 today. Previous platelet count was within normal limits in March 2022.  Given his elevated D-dimer, PT, PTT this is suggestive of DIC.  Although fibrinogen is expected to be decreased, his is within normal limits.  It may be falsely elevated as an acute phase reactant.  Follow-up in the cancer center as above. Coagulopathy: Possibly underlying DIC.  Monitor. Hematuria: Noncontrast CT did not reveal any significant pathology. Renal insufficiency: Case discussed with nephrology.  Creatinine has improved to 1.66 which is nearly his baseline approximately 1.4.   CHF exacerbation, fluid overload: Resolved.  Appreciate cardiology input.  Lloyd Huger, MD   07/27/2021 9:57 AM

## 2021-07-27 NOTE — Consult Note (Addendum)
   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  Mild LVH.  Grade 3 diastolic dysfunction.  Right ventricular systolic function is severely reduced.  He presented to the emergency room with a more than 2-week history of progressively worsening shortness of breath and bilateral lower extremity edema.  Chest x-ray revealed pulmonary edema.   Comorbidities:  Hypertension Hyperlipidemia Type 2 diabetes Hypothyroidism Coronary artery disease Atrial fibrillation on anticoagulation Chronic kidney disease  Medications:  Amiodarone 200 mg daily Atorvastatin 80 mg daily Torsemide 20 mg 2 times a day  Labs:  Sodium 136, potassium 3.8, chloride 104, CO2 28, BUN 30, creatinine 1.66 Weight is 57.8 kg Blood pressure 105/64.  Initial meeting with patient today with 2 family members in the room.  He states that he does not weigh himself on a daily basis.  Discussed the importance of weighing daily and being able to catch weight gains and report to physician.  Discussed no salt and low sodium foods.  Family member states that he uses a lot of salt at the table, explained the reasoning behind salt and fluid and the effects on his body/heart.  Discussed seasonings to use in place of salt.   Also discussed the outpatient heart failure clinic, for which he has an appointment on November 22 at 2 PM as he is to see Dr. Chancy Milroy next week.  He was given the living with heart failure teaching booklet along with his zone magnet and information on low-sodium.  Pricilla Riffle RN [facility]

## 2021-07-27 NOTE — Progress Notes (Signed)
PROGRESS NOTE    Walter Blair  ZHG:[id number] DOB: Dec 07, 1945 DOA: 07/24/2021 PCP: Jodi Marble, MD   Chief Complain: Shortness of breath  Brief Narrative: Patient is a 75 year old male with history of hypertension, hyperlipidemia, diabetes type 2, stroke, hypothyroidism, coronary artery disease, A. fib on Xarelto, CHF with EF of 15%, CKD stage IIIa, status post pacemaker placement, hematuria following with urology who presented with shortness of breath.  He reported shortness of breath for more than 2 weeks and was progressively getting worse.  No report of chest pain.  Also had worsening bilateral lower extremity edema.  On presentation WBC count was 4.1.  Hemoglobin was 7.9, platelet of 72, INR of 3.4, PTT of 72, BNP of 3305, D-dimer of 3.8, he was hypotensive.  He was found to have worsening renal function.  Chest x-ray pulmonary edema.  Patient was admitted for the management of acute on chronic CHF exacerbation, possible DIC, AKI on CKD.  Cardiology, hematology, nephrology were following.  Volume overload has significantly improved, lower extremity edema resolved.  Lasix drip discontinued today. He has been transfused a unit of PRBC today.  Plan for discharge tomorrow to home.  Assessment & Plan:   Principal Problem:   Acute on chronic systolic CHF (congestive heart failure) (HCC) Active Problems:   HTN (hypertension)   CAD (coronary artery disease)   Iron deficiency anemia   Atrial fibrillation, chronic (HCC)   H/O: stroke   Hematuria   Acute renal failure superimposed on stage 3a chronic kidney disease (HCC)   Thrombocytopenia (HCC)   HLD (hyperlipidemia)   Type II diabetes mellitus with renal manifestations ([place])   Hypothyroid   Acute on chronic systolic congestive heart failure: Last 2D echo on 06/29/2017 had shown EF of 15%.  Patient with bilateral lower extremity edema, positive JVD, crackles on auscultation, pulm edema on chest x-ray, elevated BNP on presentation.   Clinically found to have CHF exacerbation.  Started on Lasix drip.  Cardiology following.  Entresto on hold due to soft BP.  2D echo done during this hospitalization showed EF of 20 to 19%, grade 3 diastolic dysfunction. bilateral lower EXTR edema has completely resolved.  Lasix drip discontinued.  Restarted home torsemide. 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  Peripheral smear showed some schistocytes.  Closely were following .  Clinical scenario consistent with DIC.  TTP is less likely.  Xarelto held now.  ADAMTS13 negative.    AKI on CKD stage IIIa: Baseline creatinine is around 1-1.2.  Presented with creatinine in the range of 2.  Possible cardiorenal syndrome vs ATN as he was hypotensive. kidney function improved with IV diuresis  Chronic A. fib: Currently rate is well controlled.  He was on Xarelto for anticoagulation.  Also on amiodarone.  He needs to follow-up with cardiology as an outpatient, and Xarelto currently being held, can resume as an outpatient as per cardiology.  Hypertension: Monitor blood pressure.  Currently stable .antihypertensives on hold  History of coronary artery disease: No anginal symptoms.  On Lipitor  Normocytic deficiency anemia: Started on iron supplementation.  Hemoglobin in the range of low 7.  He has been transfused with a unit of PRBC.  Check hemoglobin tomorrow.  History of stroke: On Xarelto and Lipitor at home  Hematuria: Currently following with urology.  Plan for CT urogram/cystoscopy as an outpatient.  CT abdomen/pelvis did not show any acute findings.  Xarelto on hold  Hyperlipidemia: On Lipitor  Type 2 diabetes: Recent hemoglobin A1c of 5.9.  Well controlled.  On metformin and glipizide at home.  Currently on sliding scale insulin.  Generalized weakness: PT/OT ordered           DVT  prophylaxis:Xarelto Code Status: Full code Family Communication: Talked to nice Alverda Skeans( she is a physician) on phone on 07/26/21 on [phone] Discussed with brother at the bedside today  Status is: Inpatient  Remains inpatient appropriate because: Critical illness     Consultants: Cardiology, nephrology, hematology  Procedures: None yet  Antimicrobials:  Anti-infectives (From admission, onward)    None       Subjective:  Patient seen and examined the bedside this morning.  Hemodynamically stable.  Overall comfortable and feels better.  Lower extremity edema has resolved.  Currently on room air.  Denies shortness of breath at rest  Objective: Vitals:   07/26/21 2118 07/27/21 0021 07/27/21 0505 07/27/21 0801  BP: 102/72 (!) 101/58 99/61 108/63  Pulse: 60 (!) 59 62 60  Resp: 18 18 18 18   Temp: 98.7 F (37.1 C) 97.9 F (36.6 C) 98 F (36.7 C) 97.8 F (36.6 C)  TempSrc: Oral Oral Oral Oral  SpO2: 99% 100% 100% 100%  Weight:      Height:        Intake/Output Summary (Last 24 hours) at 07/27/2021 0827 Last data filed at 07/27/2021 0510 Gross per 24 hour  Intake 520 ml  Output 3325 ml  Net -2805 ml   Filed Weights   07/24/21 1015 07/25/21 1902 07/26/21 0104  Weight: 64 kg 58.7 kg 58 kg    Examination:   General exam: Deconditioned, chronically ill looking HEENT: PERRL Respiratory system:  no wheezes or crackles  Cardiovascular system: S1 & S2 heard, RRR.  Pacemaker Gastrointestinal system: Abdomen is nondistended, soft and nontender. Central nervous system: Alert and oriented Extremities: No edema, no clubbing ,no cyanosis Skin: No rashes, no ulcers,no icterus    Data Reviewed: I have personally reviewed following labs and imaging studies  CBC: Recent Labs  Lab 07/25/21 0647 07/25/21 1900 07/26/21 0522 07/26/21 1534 07/27/21 0455  WBC 3.3* 4.7 3.6* 4.1 4.1  HGB 7.6* 8.7* 7.4* 7.9* 7.2*  HCT 23.7* 26.5* 22.9* 24.3* 22.3*  MCV 92.9 92.7  90.9 92.7 90.7  PLT 82* 132* 83* 100* 93*   Basic Metabolic Panel: Recent Labs  Lab 07/24/21 1011 07/25/21 0647 07/25/21 0834 07/26/21 0522 07/27/21 0455  NA 134* 137 137 137 136  K 3.9 3.1* 3.3* 3.3* 3.8  CL 100 102 101 101 104  CO2 24 25 26 25 28   GLUCOSE 193* 69* 73 92 104*  BUN 40* 39* 37* 35* 30*  CREATININE 2.19* 1.84* 1.91* 1.71* 1.66*  CALCIUM 8.6* 8.2* 8.3* 8.0* 8.8*  MG  --   --  1.0* 1.9  --    GFR: Estimated Creatinine Clearance: 31.1 mL/min (A) (by C-G formula based on SCr of 1.66 mg/dL (H)). Liver Function Tests: Recent Labs  Lab 07/24/21 1011 07/25/21 0647 07/25/21 0834  AST 29 25 26   ALT 13 13 13   ALKPHOS 49 44 47  BILITOT 1.4* 1.5* 1.7*  PROT 6.7 6.5 6.9  ALBUMIN 3.7 3.7 3.7   No results for input(s): LIPASE, AMYLASE in the last 168 hours. No results for input(s): AMMONIA in the last 168 hours. Coagulation Profile: Recent Labs  Lab 07/24/21 1230  INR 3.4*   Cardiac Enzymes: No results for input(s): CKTOTAL,  CKMB, CKMBINDEX, TROPONINI in the last 168 hours. BNP (last 3 results) No results for input(s): PROBNP in the last 8760 hours. HbA1C: No results for input(s): HGBA1C in the last 72 hours. CBG: Recent Labs  Lab 07/26/21 0800 07/26/21 1126 07/26/21 1606 07/26/21 2111 07/27/21 0803  GLUCAP 90 209* 164* 178* 108*   Lipid Profile: No results for input(s): CHOL, HDL, LDLCALC, TRIG, CHOLHDL, LDLDIRECT in the last 72 hours. Thyroid Function Tests: No results for input(s): TSH, T4TOTAL, FREET4, T3FREE, THYROIDAB in the last 72 hours. Anemia Panel: Recent Labs    07/24/21 1230  RETICCTPCT 1.4   Sepsis Labs: No results for input(s): PROCALCITON, LATICACIDVEN in the last 168 hours.  Recent Results (from the past 240 hour(s))  Resp Panel by RT-PCR (Flu A&B, Covid) Nasopharyngeal Swab     Status: None   Collection Time: 07/24/21 12:32 PM   Specimen: Nasopharyngeal Swab; Nasopharyngeal(NP) swabs in vial transport medium  Result Value  Ref Range Status   SARS Coronavirus 2 by RT PCR NEGATIVE NEGATIVE Final    Comment: (NOTE) SARS-CoV-2 target nucleic acids are NOT DETECTED.  The SARS-CoV-2 RNA is generally detectable in upper respiratory specimens during the acute phase of infection. The lowest concentration of SARS-CoV-2 viral copies this assay can detect is 138 copies/mL. A negative result does not preclude SARS-Cov-2 infection and should not be used as the sole basis for treatment or other patient management decisions. A negative result may occur with  improper specimen collection/handling, submission of specimen other than nasopharyngeal swab, presence of viral mutation(s) within the areas targeted by this assay, and inadequate number of viral copies(<138 copies/mL). A negative result must be combined with clinical observations, patient history, and epidemiological information. The expected result is Negative.  Fact Sheet for Patients:  EntrepreneurPulse.com.au  Fact Sheet for Healthcare Providers:  IncredibleEmployment.be  This test is no t yet approved or cleared by the [place] FDA and  has been authorized for detection and/or diagnosis of SARS-CoV-2 by FDA under an Emergency Use Authorization (EUA). This EUA will remain  in effect (meaning this test can be used) for the duration of the COVID-19 declaration under Section 564(b)(1) of the Act, 21 U.S.C.section 360bbb-3(b)(1), unless the authorization is terminated  or revoked sooner.       Influenza A by PCR NEGATIVE NEGATIVE Final   Influenza B by PCR NEGATIVE NEGATIVE Final    Comment: (NOTE) The Xpert Xpress SARS-CoV-2/FLU/RSV plus assay is intended as an aid in the diagnosis of influenza from Nasopharyngeal swab specimens and should not be used as a sole basis for treatment. Nasal washings and aspirates are unacceptable for Xpert Xpress SARS-CoV-2/FLU/RSV testing.  Fact Sheet for  Patients: EntrepreneurPulse.com.au  Fact Sheet for Healthcare Providers: IncredibleEmployment.be  This test is not yet approved or cleared by the [place] FDA and has been authorized for detection and/or diagnosis of SARS-CoV-2 by FDA under an Emergency Use Authorization (EUA). This EUA will remain in effect (meaning this test can be used) for the duration of the COVID-19 declaration under Section 564(b)(1) of the Act, 21 U.S.C. section 360bbb-3(b)(1), unless the authorization is terminated or revoked.  Performed at [facility], [address], [place],  [address]          Radiology Studies: ECHOCARDIOGRAM COMPLETE  Result Date: 07/25/2021    ECHOCARDIOGRAM REPORT   Patient Name:   Walter Blair Date of Exam: 07/25/2021 Medical Rec #:  [id number]      Height:  66.0 in Accession #:    [id number]     Weight:       141.1 lb Date of Birth:  11/01/1945       BSA:          1.724 m Patient Age:    75 years       BP:           140/60 mmHg Patient Gender: M              HR:           60 bpm. Exam Location:  [facility] Procedure: 2D Echo, Strain Analysis and 3D Echo Indications:     CHF I50.21  History:         Patient has no prior history of Echocardiogram examinations.  Sonographer:     Kathlen Brunswick RDCS Referring Phys:  Unknown Foley NIU Diagnosing Phys: Neoma Laming  Sonographer Comments: Global longitudinal strain was attempted. IMPRESSIONS  1. Left ventricular ejection fraction, by estimation, is 20 to 25%. The left ventricle has severely decreased function. The left ventricle demonstrates global hypokinesis. The left ventricular internal cavity size was severely dilated. There is mild left ventricular hypertrophy. Left ventricular diastolic parameters are consistent with Grade III diastolic dysfunction (restrictive).  2. Right ventricular systolic function is severely reduced. The right ventricular size is severely enlarged. Mildly  increased right ventricular wall thickness. There is mildly elevated pulmonary artery systolic pressure.  3. Left atrial size was severely dilated.  4. Right atrial size was severely dilated.  5. The mitral valve is abnormal. Moderate to severe mitral valve regurgitation.  6. Tricuspid valve regurgitation is severe.  7. The aortic valve is calcified. Aortic valve regurgitation is mild. Mild aortic valve sclerosis is present, with no evidence of aortic valve stenosis. Conclusion(s)/Recommendation(s): Findings consistent with ischemic cardiomyopathy. FINDINGS  Left Ventricle: Left ventricular ejection fraction, by estimation, is 20 to 25%. The left ventricle has severely decreased function. The left ventricle demonstrates global hypokinesis. Definity contrast agent was given IV to delineate the left ventricular endocardial borders. The left ventricular internal cavity size was severely dilated. There is mild left ventricular hypertrophy. Left ventricular diastolic parameters are consistent with Grade III diastolic dysfunction (restrictive). Right Ventricle: The right ventricular size is severely enlarged. Mildly increased right ventricular wall thickness. Right ventricular systolic function is severely reduced. There is mildly elevated pulmonary artery systolic pressure. Left Atrium: Left atrial size was severely dilated. Right Atrium: Right atrial size was severely dilated. Pericardium: The pericardium was not well visualized. Mitral Valve: The mitral valve is abnormal. Moderate to severe mitral valve regurgitation. Tricuspid Valve: The tricuspid valve is grossly normal. Tricuspid valve regurgitation is severe. Aortic Valve: The aortic valve is calcified. Aortic valve regurgitation is mild. Aortic regurgitation PHT measures 621 msec. Mild aortic valve sclerosis is present, with no evidence of aortic valve stenosis. Aortic valve peak gradient measures 4.2 mmHg. Pulmonic Valve: The pulmonic valve was grossly normal.  Pulmonic valve regurgitation is mild to moderate. Aorta: The aortic root, ascending aorta and aortic arch are all structurally normal, with no evidence of dilitation or obstruction. IAS/Shunts: The interatrial septum was not well visualized.  LEFT VENTRICLE PLAX 2D LVIDd:         6.09 cm      Diastology LVIDs:         5.65 cm      LV e' lateral:   8.16 cm/s LV PW:         0.76 cm  LV E/e' lateral: 11.3 LV IVS:        0.76 cm LVOT diam:     2.00 cm LV SV:         31 LV SV Index:   18 LVOT Area:     3.14 cm     3D Volume EF:                             3D EF:        24 %                             LV EDV:       159 ml LV Volumes (MOD)            LV ESV:       121 ml LV vol d, MOD A2C: 133.0 ml LV SV:        38 ml LV vol d, MOD A4C: 148.0 ml LV vol s, MOD A2C: 105.0 ml LV vol s, MOD A4C: 114.0 ml LV SV MOD A2C:     28.0 ml LV SV MOD A4C:     148.0 ml LV SV MOD BP:      32.7 ml RIGHT VENTRICLE RV Basal diam:  3.69 cm RV S prime:     8.27 cm/s TAPSE (M-mode): 1.2 cm LEFT ATRIUM             Index        RIGHT ATRIUM           Index LA diam:        5.10 cm 2.96 cm/m   RA Area:     30.00 cm LA Vol (A2C):   56.0 ml 32.48 ml/m  RA Volume:   118.00 ml 68.44 ml/m LA Vol (A4C):   98.0 ml 56.84 ml/m LA Biplane Vol: 79.1 ml 45.88 ml/m  AORTIC VALVE                 PULMONIC VALVE AV Area (Vmax): 1.57 cm     PV Vmax:          0.92 m/s AV Vmax:        103.00 cm/s  PV Peak grad:     3.4 mmHg AV Peak Grad:   4.2 mmHg     PR End Diast Vel: 4.49 msec LVOT Vmax:      51.60 cm/s LVOT Vmean:     31.100 cm/s LVOT VTI:       0.098 m AI PHT:         621 msec  AORTA Ao Root diam: 2.80 cm Ao Asc diam:  3.20 cm MITRAL VALVE                  TRICUSPID VALVE MV Area (PHT): 3.56 cm       TV Peak grad:   17.5 mmHg MV Decel Time: 213 msec       TV Vmax:        2.09 m/s MR Peak grad:    66.6 mmHg MR Mean grad:    41.0 mmHg    SHUNTS MR Vmax:         408.00 cm/s  Systemic VTI:  0.10 m MR Vmean:        299.0 cm/s   Systemic Diam: 2.00 cm MR  PISA:         1.01  cm MR PISA Eff ROA: 8 mm MR PISA Radius:  0.40 cm MV E velocity: 92.50 cm/s MV A velocity: 21.40 cm/s MV E/A ratio:  4.32 Shaukat Khan Electronically signed by Neoma Laming Signature Date/Time: 07/25/2021/11:55:53 AM    Final         Scheduled Meds:  sodium chloride   Intravenous Once   amiodarone  200 mg Oral Daily   atorvastatin  80 mg Oral Daily   ferrous sulfate  325 mg Oral Q breakfast   folic acid  1 mg Oral PJ-P2T   insulin aspart  0-5 Units Subcutaneous QHS   insulin aspart  0-9 Units Subcutaneous TID WC   levothyroxine  50 mcg Oral Q0600   nicotine  14 mg Transdermal Daily   pantoprazole  40 mg Oral Daily   Continuous Infusions:  furosemide (LASIX) 200 mg in dextrose 5% 100 mL (2mg /mL) infusion 8 mg/hr (07/26/21 1445)     LOS: 3 days    Time spent: 25 mins.More than 50% of that time was spent in counseling and/or coordination of care.      Shelly Coss, MD [facility] P11/03/2021, 8:27 AM

## 2021-07-27 NOTE — Care Management Important Message (Signed)
Important Message  Patient Details  Name: Walter Blair MRN: [id number] Date of Birth: 07/25/1946   Medicare Important Message Given:  Yes     Dannette Barbara 07/27/2021, 4:15 PM

## 2021-07-27 NOTE — Progress Notes (Signed)
SUBJECTIVE: Patient is feeling much better   Vitals:   07/26/21 1500 07/26/21 2118 07/27/21 0021 07/27/21 0505  BP: 106/63 102/72 (!) 101/58 99/61  Pulse: 61 60 (!) 59 62  Resp: 16 18 18 18   Temp: 98.3 F (36.8 C) 98.7 F (37.1 C) 97.9 F (36.6 C) 98 F (36.7 C)  TempSrc:  Oral Oral Oral  SpO2: 100% 99% 100% 100%  Weight:      Height:        Intake/Output Summary (Last 24 hours) at 07/27/2021 0740 Last data filed at 07/27/2021 0510 Gross per 24 hour  Intake 520 ml  Output 3325 ml  Net -2805 ml    LABS: Basic Metabolic Panel: Recent Labs    07/25/21 0834 07/26/21 0522 07/27/21 0455  NA 137 137 136  K 3.3* 3.3* 3.8  CL 101 101 104  CO2 26 25 28   GLUCOSE 73 92 104*  BUN 37* 35* 30*  CREATININE 1.91* 1.71* 1.66*  CALCIUM 8.3* 8.0* 8.8*  MG 1.0* 1.9  --    Liver Function Tests: Recent Labs    07/25/21 0647 07/25/21 0834  AST 25 26  ALT 13 13  ALKPHOS 44 47  BILITOT 1.5* 1.7*  PROT 6.5 6.9  ALBUMIN 3.7 3.7   No results for input(s): LIPASE, AMYLASE in the last 72 hours. CBC: Recent Labs    07/26/21 1534 07/27/21 0455  WBC 4.1 4.1  HGB 7.9* 7.2*  HCT 24.3* 22.3*  MCV 92.7 90.7  PLT 100* 93*   Cardiac Enzymes: No results for input(s): CKTOTAL, CKMB, CKMBINDEX, TROPONINI in the last 72 hours. BNP: Invalid input(s): POCBNP D-Dimer: Recent Labs    07/24/21 1011  DDIMER 3.38*   Hemoglobin A1C: No results for input(s): HGBA1C in the last 72 hours. Fasting Lipid Panel: No results for input(s): CHOL, HDL, LDLCALC, TRIG, CHOLHDL, LDLDIRECT in the last 72 hours. Thyroid Function Tests: No results for input(s): TSH, T4TOTAL, T3FREE, THYROIDAB in the last 72 hours.  Invalid input(s): FREET3 Anemia Panel: Recent Labs    07/24/21 1230  RETICCTPCT 1.4     PHYSICAL EXAM General: Well developed, well nourished, in no acute distress HEENT:  Normocephalic and atramatic Neck:  No JVD.  Lungs: Clear bilaterally to auscultation and  percussion. Heart: HRRR . Normal S1 and S2 without gallops or murmurs.  Abdomen: Bowel sounds are positive, abdomen soft and non-tender  Msk:  Back normal, normal gait. Normal strength and tone for age. Extremities: No clubbing, cyanosis or edema.   Neuro: Alert and oriented X 3. Psych:  Good affect, responds appropriately  TELEMETRY: VVI paced rhythm  ASSESSMENT AND PLAN: HFrEF with severe LV dysfunction and decompensated heart failure with swelling of the legs on presentation.  With Lasix drip feeling much better.  Patient also has severe anemia consider giving 1 unit of packed red blood cells.  Consider holding Xarelto.  Discussed with hematology if that is okay.  Patient can be discharged cardiac point of view with follow-up in the office in 1 week.  Principal Problem:   Acute on chronic systolic CHF (congestive heart failure) (HCC) Active Problems:   HTN (hypertension)   CAD (coronary artery disease)   Iron deficiency anemia   Atrial fibrillation, chronic (HCC)   H/O: stroke   Hematuria   Acute renal failure superimposed on stage 3a chronic kidney disease (HCC)   Thrombocytopenia (HCC)   HLD (hyperlipidemia)   Type II diabetes mellitus with renal manifestations ([place])   Hypothyroid  Dionisio David, MD, [facility] 07/27/2021 7:40 AM

## 2021-07-27 NOTE — Progress Notes (Signed)
Occupational Therapy Evaluation Patient Details Name: Walter Blair MRN: [id number] DOB: 1945/12/16 Today's Date: 07/27/2021   History of Present Illness Pt is a 75 y/o M admitted on 07/24/21 with c/o SOB x 2 weeks & getting progressively worse as well as worsening BLE edema. Chest xray revealed pulmonary edema. Pt is being treated for acute on chronic CHF exacerbation, possible DIC, and AKI on CKD. PMH: HTN, HLD, DM2, stroke, hypothyroidism, CAD, a-fib on xarelto, CHF with EF of 15%, CKD stage 3A, s/p pacemaker placement, hematuria   Clinical Impression   Walter Blair was seen for OT evaluation this date. Prior to hospital admission, pt was independent. Pt lives with his brother in a home with bedroom/bath on second floor . Currently pt demonstrates impairments as described below (See OT problem list) which functionally limit his ability to perform ADL/self-care tasks. Pt currently requires MOD I for seated LBD, donning/doffing socks. MOD I for simulated grooming task, standing sinkside ~ 3 minutes, reaching outside of BOS.  Pt would benefit from skilled OT services to address noted impairments and functional limitations (see below for any additional details) in order to maximize safety and independence while minimizing falls risk and caregiver burden. Upon hospital discharge, recommend no follow up OT.       Recommendations for follow up therapy are one component of a multi-disciplinary discharge planning process, led by the attending physician.  Recommendations may be updated based on patient status, additional functional criteria and insurance authorization.   Follow Up Recommendations  No OT follow up    Assistance Recommended at Discharge Set up Supervision/Assistance  Functional Status Assessment  Patient has had a recent decline in their functional status and demonstrates the ability to make significant improvements in function in a reasonable and predictable amount of time.  Equipment  Recommendations  None recommended by OT    Recommendations for Other Services       Precautions / Restrictions Precautions Precautions: Fall Restrictions Weight Bearing Restrictions: No      Mobility Bed Mobility Overal bed mobility: Modified Independent             General bed mobility comments: Sup<>sit w/ HOB elevated    Transfers Overall transfer level: Needs assistance Equipment used: None Transfers: Sit to/from Stand Sit to Stand: Supervision                  Balance Overall balance assessment: Needs assistance Sitting-balance support: Feet supported;No upper extremity supported Sitting balance-Leahy Scale: Normal     Standing balance support: No upper extremity supported;During functional activity Standing balance-Leahy Scale: Good                             ADL either performed or assessed with clinical judgement   ADL Overall ADL's : Modified independent                                       General ADL Comments: MOD I for seated LBD, donning/doffing socks. MOD I for simulated grooming task, standing sinkside ~ 3 minutes, reaching outside of BOS.     Vision         Perception     Praxis      Pertinent Vitals/Pain Pain Assessment: No/denies pain     Hand Dominance     Extremity/Trunk Assessment Upper Extremity Assessment Upper Extremity Assessment: Overall [facility]  for tasks assessed   Lower Extremity Assessment Lower Extremity Assessment: Overall WFL for tasks assessed       Communication Communication Communication: No difficulties;Other (comment) (asked for frequent repetition)   Cognition Arousal/Alertness: Awake/alert Behavior During Therapy: WFL for tasks assessed/performed Overall Cognitive Status: Within Functional Limits for tasks assessed                                       General Comments      Exercises Exercises: Other exercises Other Exercises Other Exercises:  Pt educ re: OT role, d/c recs, falls prevention, ECS Other Exercises: Sup<>sit, LBD- don/doff socks, sit<>stand, simulated standing grooming task   Shoulder Instructions      Home Living Family/patient expects to be discharged to:: Private residence Living Arrangements: Alone;Other relatives Available Help at Discharge: Family Type of Home: House Home Access: Stairs to enter Technical brewer of Steps: 3 Entrance Stairs-Rails: None Home Layout: Two level;Bed/bath upstairs Alternate Level Stairs-Number of Steps: flight Alternate Level Stairs-Rails: None (notes 1 rail but doesn't state which side; also has a chair lift but he doesn't use it) Bathroom Shower/Tub: Occupational psychologist: Handicapped height                Prior Functioning/Environment Prior Level of Function : Independent/Modified Independent             Mobility Comments: Independent without AD, denies falls ADLs Comments: Independent in all ADLs        OT Problem List: Decreased activity tolerance;Decreased safety awareness;Decreased knowledge of precautions      OT Treatment/Interventions: Therapeutic activities;Energy conservation    OT Goals(Current goals can be found in the care plan section) Acute Rehab OT Goals Patient Stated Goal: to go home OT Goal Formulation: With patient Time For Goal Achievement: 08/10/21 Potential to Achieve Goals: Good ADL Goals Pt/caregiver will Perform Home Exercise Program: With minimal assist;Both right and left upper extremity (w/ equipment prn) Additional ADL Goal #1: Pt will identify and implement 3/3 ECS for modifying routine. Additional ADL Goal #2: Pt will identify and respond appropriately to 3/3 safety hazards in environment.  OT Frequency: Min 2X/week   Barriers to D/C:            Co-evaluation              AM-PAC OT "6 Clicks" Daily Activity     Outcome Measure Help from another person eating meals?: None Help from another  person taking care of personal grooming?: None Help from another person toileting, which includes using toliet, bedpan, or urinal?: A Little Help from another person bathing (including washing, rinsing, drying)?: A Little Help from another person to put on and taking off regular upper body clothing?: None Help from another person to put on and taking off regular lower body clothing?: None 6 Click Score: 22   End of Session    Activity Tolerance: Patient tolerated treatment well Patient left: in bed;with call bell/phone within reach;with bed alarm set  OT Visit Diagnosis: Muscle weakness (generalized) (M62.81);Other abnormalities of gait and mobility (R26.89)                Time: 1415-1430 OT Time Calculation (min): 15 min Charges:  OT General Charges $OT Visit: 1 Visit OT Evaluation $OT Eval Low Complexity: 1 Low  Lars Pinks 07/27/2021, 3:19 PM

## 2021-07-27 NOTE — Progress Notes (Signed)
[facility]  ROUNDING NOTE   Subjective:   Patient seen resting in bed Son at bedside Tolerating meals without nausea and vomiting Denies shortness of breath  Objective:  Vital signs in last 24 hours:  Temp:  [97.6 F (36.4 C)-98.7 F (37.1 C)] 97.6 F (36.4 C) (11/07 1127) Pulse Rate:  [59-62] 59 (11/07 1127) Resp:  [16-18] 16 (11/07 1127) BP: (99-118)/(58-72) 105/64 (11/07 1127) SpO2:  [99 %-100 %] 100 % (11/07 1127) Weight:  [57.8 kg] 57.8 kg (11/07 1100)  Weight change:  Filed Weights   07/25/21 1902 07/26/21 0104 07/27/21 1100  Weight: 58.7 kg 58 kg 57.8 kg    Intake/Output: I/O last 3 completed shifts: In: 555.9 [P.O.:480; I.V.:75.9] Out: 4150 [Urine:4150]   Intake/Output this shift:  Total I/O In: -  Out: 450 [Urine:450]  Physical Exam: General: NAD, resting comfortably  Head: Normocephalic, atraumatic. Moist oral mucosal membranes  Eyes: Anicteric  Lungs:  Clear to auscultation, normal effort  Heart: Regular rate and rhythm  Abdomen:  Soft, nontender, non distended  Extremities:  no peripheral edema.  Neurologic: Alert, moving all four extremities  Skin: No lesions       Basic Metabolic Panel: Recent Labs  Lab 07/24/21 1011 07/25/21 0647 07/25/21 0834 07/26/21 0522 07/27/21 0455  NA 134* 137 137 137 136  K 3.9 3.1* 3.3* 3.3* 3.8  CL 100 102 101 101 104  CO2 24 25 26 25 28   GLUCOSE 193* 69* 73 92 104*  BUN 40* 39* 37* 35* 30*  CREATININE 2.19* 1.84* 1.91* 1.71* 1.66*  CALCIUM 8.6* 8.2* 8.3* 8.0* 8.8*  MG  --   --  1.0* 1.9  --     Liver Function Tests: Recent Labs  Lab 07/24/21 1011 07/25/21 0647 07/25/21 0834  AST 29 25 26   ALT 13 13 13   ALKPHOS 49 44 47  BILITOT 1.4* 1.5* 1.7*  PROT 6.7 6.5 6.9  ALBUMIN 3.7 3.7 3.7   No results for input(s): LIPASE, AMYLASE in the last 168 hours. No results for input(s): AMMONIA in the last 168 hours.  CBC: Recent Labs  Lab 07/25/21 0647 07/25/21 1900 07/26/21 0522  07/26/21 1534 07/27/21 0455  WBC 3.3* 4.7 3.6* 4.1 4.1  HGB 7.6* 8.7* 7.4* 7.9* 7.2*  HCT 23.7* 26.5* 22.9* 24.3* 22.3*  MCV 92.9 92.7 90.9 92.7 90.7  PLT 82* 132* 83* 100* 93*    Cardiac Enzymes: No results for input(s): CKTOTAL, CKMB, CKMBINDEX, TROPONINI in the last 168 hours.  BNP: Invalid input(s): POCBNP  CBG: Recent Labs  Lab 07/26/21 1126 07/26/21 1606 07/26/21 2111 07/27/21 0803 07/27/21 1102  GLUCAP 209* 164* 178* 108* 170*    Microbiology: Results for orders placed or performed during the hospital encounter of 07/24/21  Resp Panel by RT-PCR (Flu A&B, Covid) Nasopharyngeal Swab     Status: None   Collection Time: 07/24/21 12:32 PM   Specimen: Nasopharyngeal Swab; Nasopharyngeal(NP) swabs in vial transport medium  Result Value Ref Range Status   SARS Coronavirus 2 by RT PCR NEGATIVE NEGATIVE Final    Comment: (NOTE) SARS-CoV-2 target nucleic acids are NOT DETECTED.  The SARS-CoV-2 RNA is generally detectable in upper respiratory specimens during the acute phase of infection. The lowest concentration of SARS-CoV-2 viral copies this assay can detect is 138 copies/mL. A negative result does not preclude SARS-Cov-2 infection and should not be used as the sole basis for treatment or other patient management decisions. A negative result may occur with  improper specimen collection/handling,  submission of specimen other than nasopharyngeal swab, presence of viral mutation(s) within the areas targeted by this assay, and inadequate number of viral copies(<138 copies/mL). A negative result must be combined with clinical observations, patient history, and epidemiological information. The expected result is Negative.  Fact Sheet for Patients:  EntrepreneurPulse.com.au  Fact Sheet for Healthcare Providers:  IncredibleEmployment.be  This test is no t yet approved or cleared by the [place] FDA and  has been authorized for  detection and/or diagnosis of SARS-CoV-2 by FDA under an Emergency Use Authorization (EUA). This EUA will remain  in effect (meaning this test can be used) for the duration of the COVID-19 declaration under Section 564(b)(1) of the Act, 21 U.S.C.section 360bbb-3(b)(1), unless the authorization is terminated  or revoked sooner.       Influenza A by PCR NEGATIVE NEGATIVE Final   Influenza B by PCR NEGATIVE NEGATIVE Final    Comment: (NOTE) The Xpert Xpress SARS-CoV-2/FLU/RSV plus assay is intended as an aid in the diagnosis of influenza from Nasopharyngeal swab specimens and should not be used as a sole basis for treatment. Nasal washings and aspirates are unacceptable for Xpert Xpress SARS-CoV-2/FLU/RSV testing.  Fact Sheet for Patients: EntrepreneurPulse.com.au  Fact Sheet for Healthcare Providers: IncredibleEmployment.be  This test is not yet approved or cleared by the [place] FDA and has been authorized for detection and/or diagnosis of SARS-CoV-2 by FDA under an Emergency Use Authorization (EUA). This EUA will remain in effect (meaning this test can be used) for the duration of the COVID-19 declaration under Section 564(b)(1) of the Act, 21 U.S.C. section 360bbb-3(b)(1), unless the authorization is terminated or revoked.  Performed at [facility], [address] [place], [place] [address]     Coagulation Studies: No results for input(s): LABPROT, INR in the last 72 hours.  Urinalysis: No results for input(s): COLORURINE, LABSPEC, PHURINE, GLUCOSEU, HGBUR, BILIRUBINUR, KETONESUR, PROTEINUR, UROBILINOGEN, NITRITE, LEUKOCYTESUR in the last 72 hours.  Invalid input(s): APPERANCEUR    Imaging: No results found.   Medications:     amiodarone  200 mg Oral Daily   atorvastatin  80 mg Oral Daily   ferrous sulfate  325 mg Oral Q breakfast   folic acid  1 mg Oral DG-L8V   insulin aspart  0-5 Units Subcutaneous QHS    insulin aspart  0-9 Units Subcutaneous TID WC   levothyroxine  50 mcg Oral Q0600   nicotine  14 mg Transdermal Daily   pantoprazole  40 mg Oral Daily   torsemide  20 mg Oral BID   acetaminophen, albuterol, dextromethorphan-guaiFENesin, ondansetron (ZOFRAN) IV, polyethylene glycol  Assessment/ Plan:  Walter Blair is a 75 y.o.  male with medical problems of CAD, DM, h/o stroke, HTN, A Fib, hematuria, HLD, who was admitted for Shortness of breath [R06.02] Acute on chronic systolic CHF (congestive heart failure) (HCC) [I50.23] Acute on chronic congestive heart failure, unspecified heart failure type ([place]) [I50.9]   Acute Kidney Injury with gross hematuria on chronic kidney disease stage 3A with baseline creatinine 1.4 and GFR of 49 on 12/26/20.  Acute kidney injury secondary to possible cardiorenal syndrome and abnormal hemodynamics. Holding meformin, Xarelto, digoxin, entresto, spironolactone and Verciguat.  Avoid nephrotoxic agents and therapies. No acute need for dialysis at this time. Discontinue Lasix drip and start Torsemide 40mg  twice daily. Will schedule follow up in office for 1-2 weeks.   Lab Results  Component Value Date   CREATININE 1.66 (H) 07/27/2021   CREATININE 1.71 (H) 07/26/2021  CREATININE 1.91 (H) 07/25/2021    Intake/Output Summary (Last 24 hours) at 07/27/2021 1330 Last data filed at 07/27/2021 1100 Gross per 24 hour  Intake 280 ml  Output 3275 ml  Net -2995 ml   2. Hypotension with chronic kidney disease. BP 105/64. Transitioned to Torsemide as above.   3. Acute exacerbation of chronic systolic CHF. ECHO on 07/25/21 show EF 20-25%.     LOS: 3   11/7/20221:30 PM

## 2021-07-27 NOTE — Evaluation (Signed)
Physical Therapy Evaluation Patient Details Name: Walter Blair MRN: [id number] DOB: 1945-09-28 Today's Date: 07/27/2021  History of Present Illness  Pt is a 75 y/o M admitted on 07/24/21 with c/o SOB x 2 weeks & getting progressively worse as well as worsening BLE edema. Chest xray revealed pulmonary edema. Pt is being treated for acute on chronic CHF exacerbation, possible DIC, and AKI on CKD. PMH: HTN, HLD, DM2, stroke, hypothyroidism, CAD, a-fib on xarelto, CHF with EF of 15%, CKD stage 3A, s/p pacemaker placement, hematuria  Clinical Impression  Pt seen for PT evaluation with brother present for session. Pt reports he lives with another brother in a multi-level home with stairs without rails to enter & a flight with 1 rail to access bedroom on the 2nd floor (reports he also has a stair lift but he doesn't use it). Pt reports he's independent without AD prior to admission. On this date pt is able to complete bed mobility with mod I and ambulate 1 lap around the nurses station without AD with min assist 2/2 narrow step width with at times scissoring RLE. Pt declines attempting stair negotiation on this date 2/2 fatigue. Will continue to follow pt acutely to progress endurance, balance with mobility, & stair negotiation.       Recommendations for follow up therapy are one component of a multi-disciplinary discharge planning process, led by the attending physician.  Recommendations may be updated based on patient status, additional functional criteria and insurance authorization.  Follow Up Recommendations Home health PT    Assistance Recommended at Discharge Intermittent Supervision/Assistance  Functional Status Assessment Patient has had a recent decline in their functional status and demonstrates the ability to make significant improvements in function in a reasonable and predictable amount of time.  Equipment Recommendations  Rolling walker (2 wheels)    Recommendations for Other Services        Precautions / Restrictions Precautions Precautions: Fall Restrictions Weight Bearing Restrictions: No      Mobility  Bed Mobility Overal bed mobility: Modified Independent             General bed mobility comments: supine>sit with HOB slightly elevated, bed rails    Transfers Overall transfer level: Needs assistance Equipment used: None Transfers: Sit to/from Stand Sit to Stand: Min guard                Ambulation/Gait Ambulation/Gait assistance: Min assist;Min guard Gait Distance (Feet): 160 Feet Assistive device: None Gait Pattern/deviations: Staggering left Gait velocity: slightly decreased     General Gait Details: narrow step width & at times scissoring gait with RLE which causes ~2 LOB with PT providing min assist to correct  Stairs            Wheelchair Mobility    Modified Rankin (Stroke Patients Only)       Balance Overall balance assessment: Needs assistance Sitting-balance support: Feet supported;Bilateral upper extremity supported Sitting balance-Leahy Scale: Good     Standing balance support: No upper extremity supported;During functional activity Standing balance-Leahy Scale: Fair                               Pertinent Vitals/Pain Pain Assessment: No/denies pain    Home Living Family/patient expects to be discharged to:: Private residence Living Arrangements:  (brother) Available Help at Discharge: Family Type of Home: House Home Access: Stairs to enter Entrance Stairs-Rails: None Entrance Stairs-Number of Steps: 3 Alternate Level Stairs-Number of  Steps: flight Home Layout: Two level;Bed/bath upstairs        Prior Function Prior Level of Function : Independent/Modified Independent             Mobility Comments: Independent without AD, denies falls       Hand Dominance        Extremity/Trunk Assessment   Upper Extremity Assessment Upper Extremity Assessment: Overall WFL for tasks  assessed    Lower Extremity Assessment Lower Extremity Assessment: Generalized weakness       Communication      Cognition Arousal/Alertness: Awake/alert Behavior During Therapy: WFL for tasks assessed/performed Overall Cognitive Status: Within Functional Limits for tasks assessed                                          General Comments General comments (skin integrity, edema, etc.): HRs in the 60s during session, SPO2 >90% on room air    Exercises     Assessment/Plan    PT Assessment Patient needs continued PT services  PT Problem List Decreased strength;Decreased mobility;Decreased activity tolerance;Decreased balance;Decreased knowledge of use of DME       PT Treatment Interventions DME instruction;Therapeutic activities;Modalities;Gait training;Therapeutic exercise;Patient/family education;Stair training;Balance training;Functional mobility training;Neuromuscular re-education    PT Goals (Current goals can be found in the Care Plan section)  Acute Rehab PT Goals Patient Stated Goal: get better PT Goal Formulation: With patient Time For Goal Achievement: 08/10/21 Potential to Achieve Goals: Good    Frequency Min 2X/week   Barriers to discharge        Co-evaluation               AM-PAC PT "6 Clicks" Mobility  Outcome Measure Help needed turning from your back to your side while in a flat bed without using bedrails?: None Help needed moving from lying on your back to sitting on the side of a flat bed without using bedrails?: None Help needed moving to and from a bed to a chair (including a wheelchair)?: A Little Help needed standing up from a chair using your arms (e.g., wheelchair or bedside chair)?: None Help needed to walk in hospital room?: A Little Help needed climbing 3-5 steps with a railing? : A Little 6 Click Score: 21    End of Session   Activity Tolerance: Patient tolerated treatment well Patient left: in bed;with call  bell/phone within reach;with bed alarm set;with family/visitor present Nurse Communication: Mobility status PT Visit Diagnosis: Unsteadiness on feet (R26.81);Muscle weakness (generalized) (M62.81)    Time: 1005-1020 PT Time Calculation (min) (ACUTE ONLY): 15 min   Charges:   PT Evaluation $PT Eval Low Complexity: Douglas, PT, DPT 07/27/21, 11:50 AM   Waunita Schooner 07/27/2021, 11:48 AM

## 2021-07-28 DIAGNOSIS — I5023 Acute on chronic systolic (congestive) heart failure: Secondary | ICD-10-CM | POA: Diagnosis not present

## 2021-07-28 LAB — CBC WITH DIFFERENTIAL/PLATELET
Abs Immature Granulocytes: 0.01 10*3/uL (ref 0.00–0.07)
Basophils Absolute: 0 10*3/uL (ref 0.0–0.1)
Basophils Relative: 1 %
Eosinophils Absolute: 0.1 10*3/uL (ref 0.0–0.5)
Eosinophils Relative: 2 %
HCT: 27.1 % — ABNORMAL LOW (ref 39.0–52.0)
Hemoglobin: 8.9 g/dL — ABNORMAL LOW (ref 13.0–17.0)
Immature Granulocytes: 0 %
Lymphocytes Relative: 21 %
Lymphs Abs: 0.9 10*3/uL (ref 0.7–4.0)
MCH: 29.8 pg (ref 26.0–34.0)
MCHC: 32.8 g/dL (ref 30.0–36.0)
MCV: 90.6 fL (ref 80.0–100.0)
Monocytes Absolute: 0.5 10*3/uL (ref 0.1–1.0)
Monocytes Relative: 11 %
Neutro Abs: 2.7 10*3/uL (ref 1.7–7.7)
Neutrophils Relative %: 65 %
Platelets: 93 10*3/uL — ABNORMAL LOW (ref 150–400)
RBC: 2.99 MIL/uL — ABNORMAL LOW (ref 4.22–5.81)
RDW: 16.4 % — ABNORMAL HIGH (ref 11.5–15.5)
WBC: 4.1 10*3/uL (ref 4.0–10.5)
nRBC: 0 % (ref 0.0–0.2)

## 2021-07-28 LAB — BASIC METABOLIC PANEL
Anion gap: 11 (ref 5–15)
BUN: 27 mg/dL — ABNORMAL HIGH (ref 8–23)
CO2: 26 mmol/L (ref 22–32)
Calcium: 8.5 mg/dL — ABNORMAL LOW (ref 8.9–10.3)
Chloride: 99 mmol/L (ref 98–111)
Creatinine, Ser: 1.7 mg/dL — ABNORMAL HIGH (ref 0.61–1.24)
GFR, Estimated: 41 mL/min — ABNORMAL LOW (ref >60)
Glucose, Bld: 108 mg/dL — ABNORMAL HIGH (ref 70–99)
Potassium: 3.3 mmol/L — ABNORMAL LOW (ref 3.5–5.1)
Sodium: 136 mmol/L (ref 135–145)

## 2021-07-28 LAB — BPAM RBC
Blood Product Expiration Date: 202211242359
ISSUE DATE / TIME: 202211071103
Unit Type and Rh: 600

## 2021-07-28 LAB — GLUCOSE, CAPILLARY
Glucose-Capillary: 99 mg/dL (ref 70–99)
Glucose-Capillary: 206 mg/dL — ABNORMAL HIGH (ref 70–99)

## 2021-07-28 LAB — TYPE AND SCREEN
ABO/RH(D): A POS
Antibody Screen: NEGATIVE
Unit division: 0

## 2021-07-28 MED ORDER — POTASSIUM CHLORIDE CRYS ER 20 MEQ PO TBCR: 20.0000 meq | EXTENDED_RELEASE_TABLET | Freq: Every day | ORAL | 0 refills | Status: DC

## 2021-07-28 MED ORDER — POTASSIUM CHLORIDE CRYS ER 20 MEQ PO TBCR
40.0000 meq | EXTENDED_RELEASE_TABLET | Freq: Once | ORAL | Status: AC
  Administered 2021-07-28: 40 meq via ORAL
  Filled 2021-07-28: qty 4

## 2021-07-28 MED ORDER — TORSEMIDE 20 MG PO TABS: 20.0000 mg | ORAL_TABLET | Freq: Every day | ORAL | Status: DC

## 2021-07-28 NOTE — Progress Notes (Signed)
[facility]  ROUNDING NOTE   Subjective:   Patient seen resting comfortably Alert and oriented Tolerating small meals Creatinine remains at baseline Denies worsening shortness of breath.  States short of breath with activities at baseline  Objective:  Vital signs in last 24 hours:  Temp:  [97.5 F (36.4 C)-98.2 F (36.8 C)] 97.6 F (36.4 C) (11/08 0741) Pulse Rate:  [59-61] 59 (11/08 0741) Resp:  [15-18] 16 (11/08 0501) BP: (98-118)/(55-71) 110/67 (11/08 0741) SpO2:  [99 %-100 %] 100 % (11/08 0741) Weight:  [56.4 kg] 56.4 kg (11/08 0500)  Weight change:  Filed Weights   07/26/21 0104 07/27/21 1100 07/28/21 0500  Weight: 58 kg 57.8 kg 56.4 kg    Intake/Output: I/O last 3 completed shifts: In: 1104.1 [P.O.:720; I.V.:78.1; Blood:306] Out: 3125 [Urine:3125]   Intake/Output this shift:  Total I/O In: 240 [P.O.:240] Out: -   Physical Exam: General: NAD, resting comfortably  Head: Normocephalic, atraumatic. Moist oral mucosal membranes  Eyes: Anicteric  Lungs:  Clear to auscultation, normal effort  Heart: Regular rate and rhythm  Abdomen:  Soft, nontender, non distended  Extremities:  no peripheral edema.  Neurologic: Alert, moving all four extremities  Skin: No lesions       Basic Metabolic Panel: Recent Labs  Lab 07/25/21 0647 07/25/21 0834 07/26/21 0522 07/27/21 0455 07/28/21 0448  NA 137 137 137 136 136  K 3.1* 3.3* 3.3* 3.8 3.3*  CL 102 101 101 104 99  CO2 25 26 25 28 26   GLUCOSE 69* 73 92 104* 108*  BUN 39* 37* 35* 30* 27*  CREATININE 1.84* 1.91* 1.71* 1.66* 1.70*  CALCIUM 8.2* 8.3* 8.0* 8.8* 8.5*  MG  --  1.0* 1.9  --   --      Liver Function Tests: Recent Labs  Lab 07/24/21 1011 07/25/21 0647 07/25/21 0834  AST 29 25 26   ALT 13 13 13   ALKPHOS 49 44 47  BILITOT 1.4* 1.5* 1.7*  PROT 6.7 6.5 6.9  ALBUMIN 3.7 3.7 3.7    No results for input(s): LIPASE, AMYLASE in the last 168 hours. No results for input(s): AMMONIA in  the last 168 hours.  CBC: Recent Labs  Lab 07/25/21 1900 07/26/21 0522 07/26/21 1534 07/27/21 0455 07/28/21 0448  WBC 4.7 3.6* 4.1 4.1 4.1  NEUTROABS  --   --   --   --  2.7  HGB 8.7* 7.4* 7.9* 7.2* 8.9*  HCT 26.5* 22.9* 24.3* 22.3* 27.1*  MCV 92.7 90.9 92.7 90.7 90.6  PLT 132* 83* 100* 93* 93*     Cardiac Enzymes: No results for input(s): CKTOTAL, CKMB, CKMBINDEX, TROPONINI in the last 168 hours.  BNP: Invalid input(s): POCBNP  CBG: Recent Labs  Lab 07/27/21 0803 07/27/21 1102 07/27/21 1626 07/27/21 2053 07/28/21 0742  GLUCAP 108* 170* 125* 192* 99     Microbiology: Results for orders placed or performed during the hospital encounter of 07/24/21  Resp Panel by RT-PCR (Flu A&B, Covid) Nasopharyngeal Swab     Status: None   Collection Time: 07/24/21 12:32 PM   Specimen: Nasopharyngeal Swab; Nasopharyngeal(NP) swabs in vial transport medium  Result Value Ref Range Status   SARS Coronavirus 2 by RT PCR NEGATIVE NEGATIVE Final    Comment: (NOTE) SARS-CoV-2 target nucleic acids are NOT DETECTED.  The SARS-CoV-2 RNA is generally detectable in upper respiratory specimens during the acute phase of infection. The lowest concentration of SARS-CoV-2 viral copies this assay can detect is 138 copies/mL. A negative result does  not preclude SARS-Cov-2 infection and should not be used as the sole basis for treatment or other patient management decisions. A negative result may occur with  improper specimen collection/handling, submission of specimen other than nasopharyngeal swab, presence of viral mutation(s) within the areas targeted by this assay, and inadequate number of viral copies(<138 copies/mL). A negative result must be combined with clinical observations, patient history, and epidemiological information. The expected result is Negative.  Fact Sheet for Patients:  EntrepreneurPulse.com.au  Fact Sheet for Healthcare Providers:   IncredibleEmployment.be  This test is no t yet approved or cleared by the [place] FDA and  has been authorized for detection and/or diagnosis of SARS-CoV-2 by FDA under an Emergency Use Authorization (EUA). This EUA will remain  in effect (meaning this test can be used) for the duration of the COVID-19 declaration under Section 564(b)(1) of the Act, 21 U.S.C.section 360bbb-3(b)(1), unless the authorization is terminated  or revoked sooner.       Influenza A by PCR NEGATIVE NEGATIVE Final   Influenza B by PCR NEGATIVE NEGATIVE Final    Comment: (NOTE) The Xpert Xpress SARS-CoV-2/FLU/RSV plus assay is intended as an aid in the diagnosis of influenza from Nasopharyngeal swab specimens and should not be used as a sole basis for treatment. Nasal washings and aspirates are unacceptable for Xpert Xpress SARS-CoV-2/FLU/RSV testing.  Fact Sheet for Patients: EntrepreneurPulse.com.au  Fact Sheet for Healthcare Providers: IncredibleEmployment.be  This test is not yet approved or cleared by the [place] FDA and has been authorized for detection and/or diagnosis of SARS-CoV-2 by FDA under an Emergency Use Authorization (EUA). This EUA will remain in effect (meaning this test can be used) for the duration of the COVID-19 declaration under Section 564(b)(1) of the Act, 21 U.S.C. section 360bbb-3(b)(1), unless the authorization is terminated or revoked.  Performed at [facility], [address] [place], [place] [address]     Coagulation Studies: No results for input(s): LABPROT, INR in the last 72 hours.  Urinalysis: No results for input(s): COLORURINE, LABSPEC, PHURINE, GLUCOSEU, HGBUR, BILIRUBINUR, KETONESUR, PROTEINUR, UROBILINOGEN, NITRITE, LEUKOCYTESUR in the last 72 hours.  Invalid input(s): APPERANCEUR    Imaging: No results found.   Medications:     amiodarone  200 mg Oral Daily    atorvastatin  80 mg Oral Daily   ferrous sulfate  325 mg Oral Q breakfast   folic acid  1 mg Oral [id number]-[id number]   insulin aspart  0-5 Units Subcutaneous QHS   insulin aspart  0-9 Units Subcutaneous TID WC   levothyroxine  50 mcg Oral Q0600   nicotine  14 mg Transdermal Daily   pantoprazole  40 mg Oral Daily   torsemide  40 mg Oral BID   acetaminophen, albuterol, dextromethorphan-guaiFENesin, ondansetron (ZOFRAN) IV, polyethylene glycol  Assessment/ Plan:  [name] is a 75 y.o.  male with medical problems of CAD, DM, h/o stroke, HTN, A Fib, hematuria, HLD, who was admitted for Shortness of breath [R06.02] Acute on chronic systolic CHF (congestive heart failure) (HCC) [I50.23] Acute on chronic congestive heart failure, unspecified heart failure type ([place]) [I50.9]   Acute Kidney Injury with gross hematuria on chronic kidney disease stage 3A with baseline creatinine 1.4 and GFR of 49 on 12/26/20.  Acute kidney injury secondary to possible cardiorenal syndrome and abnormal hemodynamics. Holding meformin, Xarelto, digoxin, entresto, spironolactone and Verciguat.  Avoid nephrotoxic agents and therapies. No acute need for dialysis at this time.  Continue torsemide 40mg  twice daily at discharge. Will  schedule follow up in office for 1-2 weeks.   Lab Results  Component Value Date   CREATININE 1.70 (H) 07/28/2021   CREATININE 1.66 (H) 07/27/2021   CREATININE 1.71 (H) 07/26/2021    Intake/Output Summary (Last 24 hours) at 07/28/2021 1101 Last data filed at 07/28/2021 1000 Gross per 24 hour  Intake 815.47 ml  Output 1000 ml  Net -184.53 ml    2. Hypotension with chronic kidney disease. BP soft at 96/64. Torsemide as above.   3. Acute exacerbation of chronic systolic CHF. ECHO on 07/25/21 show EF 20-25%.     LOS: 4   11/8/202211:01 AM

## 2021-07-28 NOTE — Progress Notes (Signed)
SUBJECTIVE: Patient is feeling much better   Vitals:   07/28/21 0100 07/28/21 0500 07/28/21 0501 07/28/21 0741  BP: 109/71  104/62 110/67  Pulse: 61  (!) 59 (!) 59  Resp: 16  16   Temp: 97.8 F (36.6 C)  98 F (36.7 C) 97.6 F (36.4 C)  TempSrc: Oral  Oral   SpO2: 100%  100% 100%  Weight:  56.4 kg    Height:        Intake/Output Summary (Last 24 hours) at 07/28/2021 0941 Last data filed at 07/27/2021 2100 Gross per 24 hour  Intake 815.47 ml  Output 1450 ml  Net -634.53 ml    LABS: Basic Metabolic Panel: Recent Labs    07/26/21 0522 07/27/21 0455 07/28/21 0448  NA 137 136 136  K 3.3* 3.8 3.3*  CL 101 104 99  CO2 25 28 26   GLUCOSE 92 104* 108*  BUN 35* 30* 27*  CREATININE 1.71* 1.66* 1.70*  CALCIUM 8.0* 8.8* 8.5*  MG 1.9  --   --    Liver Function Tests: No results for input(s): AST, ALT, ALKPHOS, BILITOT, PROT, ALBUMIN in the last 72 hours. No results for input(s): LIPASE, AMYLASE in the last 72 hours. CBC: Recent Labs    07/27/21 0455 07/28/21 0448  WBC 4.1 4.1  NEUTROABS  --  2.7  HGB 7.2* 8.9*  HCT 22.3* 27.1*  MCV 90.7 90.6  PLT 93* 93*   Cardiac Enzymes: No results for input(s): CKTOTAL, CKMB, CKMBINDEX, TROPONINI in the last 72 hours. BNP: Invalid input(s): POCBNP D-Dimer: No results for input(s): DDIMER in the last 72 hours. Hemoglobin A1C: No results for input(s): HGBA1C in the last 72 hours. Fasting Lipid Panel: No results for input(s): CHOL, HDL, LDLCALC, TRIG, CHOLHDL, LDLDIRECT in the last 72 hours. Thyroid Function Tests: No results for input(s): TSH, T4TOTAL, T3FREE, THYROIDAB in the last 72 hours.  Invalid input(s): FREET3 Anemia Panel: No results for input(s): VITAMINB12, FOLATE, FERRITIN, TIBC, IRON, RETICCTPCT in the last 72 hours.   PHYSICAL EXAM General: Well developed, well nourished, in no acute distress HEENT:  Normocephalic and atramatic Neck:  No JVD.  Lungs: Clear bilaterally to auscultation and percussion. Heart:  HRRR . Normal S1 and S2 without gallops or murmurs.  Abdomen: Bowel sounds are positive, abdomen soft and non-tender  Msk:  Back normal, normal gait. Normal strength and tone for age. Extremities: No clubbing, cyanosis or edema.   Neuro: Alert and oriented X 3. Psych:  Good affect, responds appropriately  TELEMETRY: Sinus rhythm  ASSESSMENT AND PLAN: Congestive heart failure due to HFrEF.  Patient is feeling much better advise discontinuing of Lasix drip and switch to torsemide 20 mg once a day and Aldactone 12.5 mg once a day and restart Entresto 24 mg.  Patient can be discharged with follow-up in the office next week on Tuesday at 9 AM.  Principal Problem:   Acute on chronic systolic CHF (congestive heart failure) ([place]) Active Problems:   HTN (hypertension)   CAD (coronary artery disease)   Iron deficiency anemia   Atrial fibrillation, chronic (HCC)   H/O: stroke   Hematuria   Acute renal failure superimposed on stage 3a chronic kidney disease (HCC)   Thrombocytopenia (HCC)   HLD (hyperlipidemia)   Type II diabetes mellitus with renal manifestations ([place])   Hypothyroid    Kaiah Hosea A, MD, [facility] 07/28/2021 9:41 AM

## 2021-07-28 NOTE — Progress Notes (Signed)
Nursing Discharge Note   Admit Date: 07/24/2021  Discharge date: 07/28/2021  Walter Blair is to be discharged home per MD order.  AVS completed. Reviewed with patient and family at bedside. Highlighted copy provided for patient to take home.  Patient/caregiver able to verbalize understanding of discharge instructions. PIV removed. Patient stable upon discharge.   Discharge Instructions   None    Allergies as of 07/28/2021       Reactions   Ativan [lorazepam] Shortness Of Breath, Other (See Comments)   Pt experienced adverse reaction and was transferred to ICU last time they were given Med        Medication List     STOP taking these medications    digoxin 0.125 MG tablet Commonly known as: LANOXIN   metFORMIN 500 MG 24 hr tablet Commonly known as: GLUCOPHAGE-XR   Xarelto 15 MG Tabs tablet Generic drug: Rivaroxaban       TAKE these medications    amiodarone 200 MG tablet Commonly known as: PACERONE One tablet twice a day for one week then one tablet daily   atorvastatin 80 MG tablet Commonly known as: LIPITOR Take 80 mg by mouth daily.   ferrous sulfate 325 (65 FE) MG tablet Commonly known as: FerrouSul Take 1 tablet twice a day every other day for 90 days.   folic acid 1 MG tablet Commonly known as: FOLVITE Take 1 mg by mouth every morning.   glipiZIDE 2.5 MG 24 hr tablet Commonly known as: GLUCOTROL XL Take 2.5 mg by mouth daily.   levothyroxine 50 MCG tablet Commonly known as: SYNTHROID Start one tablet per day. After two weeks, adjust to two tablets per day. Take in the morning on an empty stomach and wait 20 min before eating.   OneTouch Ultra test strip Generic drug: glucose blood daily.   pantoprazole 20 MG tablet Commonly known as: PROTONIX Take 1 tablet by mouth daily.   polyethylene glycol 17 g packet Commonly known as: MIRALAX / GLYCOLAX Take 17 g by mouth daily as needed.   potassium chloride SA 20 MEQ tablet Commonly known as:  KLOR-CON Take 1 tablet (20 mEq total) by mouth daily for 7 days.   sacubitril-valsartan 24-26 MG Commonly known as: ENTRESTO Take 1 tablet by mouth 2 (two) times daily.   spironolactone 25 MG tablet Commonly known as: ALDACTONE Take 0.5 tablets (12.5 mg total) by mouth daily.   torsemide 20 MG tablet Commonly known as: DEMADEX Take 1 tablet (20 mg total) by mouth daily. What changed: when to take this   Vericiguat 5 MG Tabs Take by mouth.        Discharge Instructions/ Education: Discharge instructions given to patient/family with verbalized understanding. Discharge education completed with patient/family including: follow up instructions, medication list, discharge activities, and limitations if indicated. Additional discharge instructions as indicated by discharging provider also reviewed. Patient and family able to verbalize understanding, all questions fully answered. Patient instructed to return to Emergency Department, call 911, or call MD for any changes in condition.  Patient escorted via wheelchair to lobby and discharged home via private automobile.

## 2021-07-28 NOTE — Discharge Summary (Signed)
Physician Discharge Summary  Ainsley Sanguinetti GXQ:[id number] DOB: 04-Feb-1946 DOA: 07/24/2021  PCP: Jodi Marble, MD  Admit date: 07/24/2021 Discharge date: 07/28/2021  Admitted From: Home Disposition:  Home  Discharge Condition:Stable CODE STATUS:FULL Diet recommendation: Heart Healthy    Brief/Interim Summary: Patient is a 75 year old male with history of hypertension, hyperlipidemia, diabetes type 2, stroke, hypothyroidism, coronary artery disease, A. fib on Xarelto, CHF with EF of 15%, CKD stage IIIa, status post pacemaker placement, hematuria following with urology who presented with shortness of breath.  He reported shortness of breath for more than 2 weeks and was progressively getting worse.  No report of chest pain.  Also had worsening bilateral lower extremity edema.  On presentation WBC count was 4.1.  Hemoglobin was 7.9, platelet of 72, INR of 3.4, PTT of 72, BNP of 3305, D-dimer of 3.8, he was hypotensive.  He was found to have worsening renal function.  Chest x-ray pulmonary edema.  Patient was admitted for the management of acute on chronic CHF exacerbation, possible DIC, AKI on CKD.  Cardiology, hematology, nephrology were following.  Volume overload has significantly improved, lower extremity edema resolved.  Lasix drip discontinued .  Patient is medically stable for discharge to home today.  PT/OT recommended home health on discharge.  Following problems were addressed during his hospitalization:  Acute on chronic systolic congestive heart failure: Last 2D echo on 06/29/2017 had shown EF of 15%.  Patient with bilateral lower extremity edema, positive JVD, crackles on auscultation, pulm edema on chest x-ray, elevated BNP on presentation.  Clinically found to have CHF exacerbation.  Started on Lasix drip.  Cardiology following.  Entresto on hold due to soft BP.  2D echo done during this hospitalization showed EF of 20 to 14%, grade 3 diastolic dysfunction. bilateral lower EXTR edema  has completely resolved.  Lasix drip discontinued.  Restarted home torsemide. He will follow-up with cardiology, Dr. 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  Peripheral smear showed some schistocytes..  Clinical scenario consistent with DIC.  TTP is less likely.  Xarelto held now.  ADAMTS13 negative.  He has an appointment with oncology as outpatient.   AKI on CKD stage IIIa: Baseline creatinine is around 1.4.  Presented with creatinine in the range of 2.  Possible cardiorenal syndrome vs ATN as he was hypotensive. kidney function improved with IV diuresis.  Nephrology recommended outpatient follow-up   Chronic A. fib: Currently rate is well controlled.  He was on Xarelto for anticoagulation.  Also on amiodarone.  He needs to follow-up with cardiology as an outpatient, and Xarelto currently being held, can resume as an outpatient as per cardiology.   Hypertension: Continue current medications  History of coronary artery disease: No anginal symptoms.  On Lipitor   Normocytic deficiency anemia: Continue iron supplementation.  Hemoglobin in the range of low 7.  He has been transfused with a unit of PRBC.  Hemoglobin in the range of 8 today.  History of stroke: On Xarelto and Lipitor at home.  Xarelto on hold   Hematuria: Currently following with urology.  Plan for CT urogram/cystoscopy as an outpatient.  CT abdomen/pelvis did not show any acute findings.  Xarelto on hold   Hyperlipidemia: On Lipitor   Type 2 diabetes: Recent hemoglobin A1c of 5.9.  Well controlled.  On metformin and glipizide at home.  Metformin discontinued on discharge.   Generalized weakness: PT/OT ordered, recommended home health  Discharge Diagnoses:  Principal Problem:   Acute on chronic systolic CHF (congestive heart failure) (HCC) Active Problems:   HTN (hypertension)   CAD (coronary artery disease)   Iron  deficiency anemia   Atrial fibrillation, chronic (HCC)   H/O: stroke   Hematuria   Acute renal failure superimposed on stage 3a chronic kidney disease (HCC)   Thrombocytopenia (HCC)   HLD (hyperlipidemia)   Type II diabetes mellitus with renal manifestations ([place])   Hypothyroid    Discharge Instructions  Discharge Instructions     Diet - low sodium heart healthy   Complete by: As directed    Discharge instructions   Complete by: As directed    1)Please take your medications as instructed 2)Please do a CBC and BMP tests in a week 3)Follow up with oncology, cardiology , nephrology and urology as an outpatient.  He has an appointment with oncology on 08/04/2021 at 1 pm.Follow up with Dr Humphrey Rolls next Tuesday   Increase activity slowly   Complete by: As directed       Allergies as of 07/28/2021       Reactions   Ativan [lorazepam] Shortness Of Breath, Other (See Comments)   Pt experienced adverse reaction and was transferred to ICU last time they were given Med        Medication List     STOP taking these medications    digoxin 0.125 MG tablet Commonly known as: LANOXIN   metFORMIN 500 MG 24 hr tablet Commonly known as: GLUCOPHAGE-XR   Xarelto 15 MG Tabs tablet Generic drug: Rivaroxaban       TAKE these medications    amiodarone 200 MG tablet Commonly known as: PACERONE One tablet twice a day for one week then one tablet daily   atorvastatin 80 MG tablet Commonly known as: LIPITOR Take 80 mg by mouth daily.   ferrous sulfate 325 (65 FE) MG tablet Commonly known as: FerrouSul Take 1 tablet twice a day every other day for 90 days.   folic acid 1 MG tablet Commonly known as: FOLVITE Take 1 mg by mouth every morning.   glipiZIDE 2.5 MG 24 hr tablet Commonly known as: GLUCOTROL XL Take 2.5 mg by mouth daily.   levothyroxine 50 MCG tablet Commonly known as: SYNTHROID Start one tablet per day. After two weeks, adjust to two tablets per day. Take in the  morning on an empty stomach and wait 20 min before eating.   OneTouch Ultra test strip Generic drug: glucose blood daily.   pantoprazole 20 MG tablet Commonly known as: PROTONIX Take 1 tablet by mouth daily.   polyethylene glycol 17 g packet Commonly known as: MIRALAX / GLYCOLAX Take 17 g by mouth daily as needed.   potassium chloride SA 20 MEQ tablet Commonly known as: KLOR-CON Take 1 tablet (20 mEq total) by mouth daily for 7 days.   sacubitril-valsartan 24-26 MG Commonly known as: ENTRESTO Take 1 tablet by mouth 2 (two) times daily.   spironolactone 25 MG tablet Commonly known as: ALDACTONE Take 0.5 tablets (12.5 mg total) by mouth daily.   torsemide 20 MG tablet Commonly known as: DEMADEX Take 1 tablet (20 mg total) by mouth daily. What changed: when to take this   Vericiguat 5 MG Tabs Take by mouth.        Follow-up Information     Lloyd Huger, MD Follow up on 08/04/2021.   Specialty: Oncology Why: @ 1:00pm Contact information: [place] [address] [phone]  Jodi Marble, MD. Schedule an appointment as soon as possible for a visit in 1 week(s).   Specialty: Internal Medicine Contact information: [address] [place] [address] [phone]         Dionisio David, MD. Schedule an appointment as soon as possible for a visit in 1 week(s).   Specialty: Cardiology Contact information: [place] [address] [phone]                Allergies  Allergen Reactions   Ativan [Lorazepam] Shortness Of Breath and Other (See Comments)    Pt experienced adverse reaction and was transferred to ICU last time they were given Med    Consultations: Nephrology, oncology, cardiology   Procedures/Studies: CT ABDOMEN PELVIS WO CONTRAST  Result Date: 07/24/2021 CLINICAL DATA:  Abdominal distension, shortness of breath, retained fluid EXAM: CT ABDOMEN AND PELVIS WITHOUT CONTRAST  TECHNIQUE: Multidetector CT imaging of the abdomen and pelvis was performed following the standard protocol without IV contrast. COMPARISON:  None. FINDINGS: Lower chest: Cardiomegaly without pericardial effusion. Pacer wires in the right heart. Previous median sternotomy. Native coronary atherosclerosis present. Trace pleural effusions. Mild bibasilar reticulonodular and peripheral tree in bud opacities worse on the left may represent mild bibasilar pneumonitis/pneumonia. Left lower lobe mild cylindrical bronchiectasis noted. Nonspecific nodular pleural thickening on the left. This is incompletely evaluated by abdominal imaging only. Hepatobiliary: Limited without IV contrast. Distended hepatic IVC and hepatic veins compatible with right heart failure. Peri portal edema suspected. No biliary obstruction pattern or large focal hepatic abnormality. Small layering calcified gallstones noted. Gallbladder nondistended. Common bile duct nondilated. Pancreas: Unremarkable. No pancreatic ductal dilatation or surrounding inflammatory changes. Spleen: Normal in size without focal abnormality. Adrenals/Urinary Tract: Nonspecific adrenal thickening bilaterally. Limited evaluation without contrast. No renal obstruction pattern. Renal vascular calcifications present. No hydroureter or ureteral calculus. No hydronephrosis. Posterior nonspecific bladder wall thickening. Stomach/Bowel: Negative for bowel obstruction, significant dilatation, ileus, or free air. Normal appendix. Small amount of upper abdomen perihepatic and perisplenic ascites. Small amount of dependent pelvic ascites. No focal fluid collection or abscess. Vascular/Lymphatic: Limited without IV contrast. Abdominal atherosclerosis noted. Negative for aneurysm. No retroperitoneal hemorrhage or hematoma. No bulky adenopathy. Reproductive: No significant finding by CT. Other: Intact abdominal wall.  No hernia.  No inguinal abnormality. Musculoskeletal: Degenerative  changes of the spine and facet joints. Old compression fracture at L4. Bones are osteopenic. IMPRESSION: Cardiomegaly. Trace pleural effusions and bibasilar reticulonodular/tree in bud mild opacities worse on the left. Difficult to exclude mild bibasilar bronchopneumonia. Small amount abdominopelvic ascites. Cholelithiasis Abdominal atherosclerosis No other acute intra-abdominal finding by noncontrast CT. Electronically Signed   By: Jerilynn Mages.  Shick M.D.   On: 07/24/2021 14:53   DG Chest 2 View  Result Date: 07/24/2021 CLINICAL DATA:  sob EXAM: CHEST - 2 VIEW COMPARISON:  July 05, 2017 FINDINGS: The cardiomediastinal silhouette is unchanged in contour.Status post median sternotomy and CABG. LEFT chest AICD. Small LEFT pleural effusion. No pneumothorax. Mild diffuse interstitial prominence. LEFT retrocardiac opacities. Visualized abdomen is unremarkable. Mild degenerative changes of the thoracic spine. IMPRESSION: Constellation of findings are favored to reflect mild pulmonary edema with LEFT basilar atelectasis and small LEFT pleural effusion. Electronically Signed   By: Valentino Saxon M.D.   On: 07/24/2021 10:48   ECHOCARDIOGRAM COMPLETE  Result Date: 07/25/2021    ECHOCARDIOGRAM REPORT   Patient Name:   LONN IM Date of Exam: 07/25/2021 Medical Rec #:  [id number]      Height:  66.0 in Accession #:    [id number]     Weight:       141.1 lb Date of Birth:  [birth year]       BSA:          1.724 m Patient Age:    25 years       BP:           140/60 mmHg Patient Gender: M              HR:           60 bpm. Exam Location:  [facility] Procedure: 2D Echo, Strain Analysis and 3D Echo Indications:     CHF I50.21  History:         Patient has no prior history of Echocardiogram examinations.  Sonographer:     Kathlen Brunswick RDCS Referring Phys:  Unknown Foley NIU Diagnosing Phys: Neoma Laming  Sonographer Comments: Global longitudinal strain was attempted. IMPRESSIONS  1. Left ventricular ejection fraction, by  estimation, is 20 to 25%. The left ventricle has severely decreased function. The left ventricle demonstrates global hypokinesis. The left ventricular internal cavity size was severely dilated. There is mild left ventricular hypertrophy. Left ventricular diastolic parameters are consistent with Grade III diastolic dysfunction (restrictive).  2. Right ventricular systolic function is severely reduced. The right ventricular size is severely enlarged. Mildly increased right ventricular wall thickness. There is mildly elevated pulmonary artery systolic pressure.  3. Left atrial size was severely dilated.  4. Right atrial size was severely dilated.  5. The mitral valve is abnormal. Moderate to severe mitral valve regurgitation.  6. Tricuspid valve regurgitation is severe.  7. The aortic valve is calcified. Aortic valve regurgitation is mild. Mild aortic valve sclerosis is present, with no evidence of aortic valve stenosis. Conclusion(s)/Recommendation(s): Findings consistent with ischemic cardiomyopathy. FINDINGS  Left Ventricle: Left ventricular ejection fraction, by estimation, is 20 to 25%. The left ventricle has severely decreased function. The left ventricle demonstrates global hypokinesis. Definity contrast agent was given IV to delineate the left ventricular endocardial borders. The left ventricular internal cavity size was severely dilated. There is mild left ventricular hypertrophy. Left ventricular diastolic parameters are consistent with Grade III diastolic dysfunction (restrictive). Right Ventricle: The right ventricular size is severely enlarged. Mildly increased right ventricular wall thickness. Right ventricular systolic function is severely reduced. There is mildly elevated pulmonary artery systolic pressure. Left Atrium: Left atrial size was severely dilated. Right Atrium: Right atrial size was severely dilated. Pericardium: The pericardium was not well visualized. Mitral Valve: The mitral valve is  abnormal. Moderate to severe mitral valve regurgitation. Tricuspid Valve: The tricuspid valve is grossly normal. Tricuspid valve regurgitation is severe. Aortic Valve: The aortic valve is calcified. Aortic valve regurgitation is mild. Aortic regurgitation PHT measures 621 msec. Mild aortic valve sclerosis is present, with no evidence of aortic valve stenosis. Aortic valve peak gradient measures 4.2 mmHg. Pulmonic Valve: The pulmonic valve was grossly normal. Pulmonic valve regurgitation is mild to moderate. Aorta: The aortic root, ascending aorta and aortic arch are all structurally normal, with no evidence of dilitation or obstruction. IAS/Shunts: The interatrial septum was not well visualized.  LEFT VENTRICLE PLAX 2D LVIDd:         6.09 cm      Diastology LVIDs:         5.65 cm      LV e' lateral:   8.16 cm/s LV PW:         0.76 cm  LV E/e' lateral: 11.3 LV IVS:        0.76 cm LVOT diam:     2.00 cm LV SV:         31 LV SV Index:   18 LVOT Area:     3.14 cm     3D Volume EF:                             3D EF:        24 %                             LV EDV:       159 ml LV Volumes (MOD)            LV ESV:       121 ml LV vol d, MOD A2C: 133.0 ml LV SV:        38 ml LV vol d, MOD A4C: 148.0 ml LV vol s, MOD A2C: 105.0 ml LV vol s, MOD A4C: 114.0 ml LV SV MOD A2C:     28.0 ml LV SV MOD A4C:     148.0 ml LV SV MOD BP:      32.7 ml RIGHT VENTRICLE RV Basal diam:  3.69 cm RV S prime:     8.27 cm/s TAPSE (M-mode): 1.2 cm LEFT ATRIUM             Index        RIGHT ATRIUM           Index LA diam:        5.10 cm 2.96 cm/m   RA Area:     30.00 cm LA Vol (A2C):   56.0 ml 32.48 ml/m  RA Volume:   118.00 ml 68.44 ml/m LA Vol (A4C):   98.0 ml 56.84 ml/m LA Biplane Vol: 79.1 ml 45.88 ml/m  AORTIC VALVE                 PULMONIC VALVE AV Area (Vmax): 1.57 cm     PV Vmax:          0.92 m/s AV Vmax:        103.00 cm/s  PV Peak grad:     3.4 mmHg AV Peak Grad:   4.2 mmHg     PR End Diast Vel: 4.49 msec LVOT Vmax:       51.60 cm/s LVOT Vmean:     31.100 cm/s LVOT VTI:       0.098 m AI PHT:         621 msec  AORTA Ao Root diam: 2.80 cm Ao Asc diam:  3.20 cm MITRAL VALVE                  TRICUSPID VALVE MV Area (PHT): 3.56 cm       TV Peak grad:   17.5 mmHg MV Decel Time: 213 msec       TV Vmax:        2.09 m/s MR Peak grad:    66.6 mmHg MR Mean grad:    41.0 mmHg    SHUNTS MR Vmax:         408.00 cm/s  Systemic VTI:  0.10 m MR Vmean:        299.0 cm/s   Systemic Diam: 2.00 cm MR PISA:         1.01  cm MR PISA Eff ROA: 8 mm MR PISA Radius:  0.40 cm MV E velocity: 92.50 cm/s MV A velocity: 21.40 cm/s MV E/A ratio:  4.32 Shaukat Khan Electronically signed by Neoma Laming Signature Date/Time: 07/25/2021/11:55:53 AM    Final       Subjective:  Patient seen and examined at the bedside this morning.  Hemodynamically stable for discharge.  Discussed plan discussed with brother at the bedside  Discharge Exam: Vitals:   07/28/21 0501 07/28/21 0741  BP: 104/62 110/67  Pulse: (!) 59 (!) 59  Resp: 16   Temp: 98 F (36.7 C) 97.6 F (36.4 C)  SpO2: 100% 100%   Vitals:   07/28/21 0100 07/28/21 0500 07/28/21 0501 07/28/21 0741  BP: 109/71  104/62 110/67  Pulse: 61  (!) 59 (!) 59  Resp: 16  16   Temp: 97.8 F (36.6 C)  98 F (36.7 C) 97.6 F (36.4 C)  TempSrc: Oral  Oral   SpO2: 100%  100% 100%  Weight:  56.4 kg    Height:        General: Pt is alert, awake, not in acute distress,weak,chronically ill looking Cardiovascular: RRR, S1/S2 +, no rubs, no gallops Respiratory: CTA bilaterally, no wheezing, no rhonchi Abdominal: Soft, NT, ND, bowel sounds + Extremities: no edema, no cyanosis    The results of significant diagnostics from this hospitalization (including imaging, microbiology, ancillary and laboratory) are listed below for reference.     Microbiology: Recent Results (from the past 240 hour(s))  Resp Panel by RT-PCR (Flu A&B, Covid) Nasopharyngeal Swab     Status: None   Collection Time: 07/24/21  12:32 PM   Specimen: Nasopharyngeal Swab; Nasopharyngeal(NP) swabs in vial transport medium  Result Value Ref Range Status   SARS Coronavirus 2 by RT PCR NEGATIVE NEGATIVE Final    Comment: (NOTE) SARS-CoV-2 target nucleic acids are NOT DETECTED.  The SARS-CoV-2 RNA is generally detectable in upper respiratory specimens during the acute phase of infection. The lowest concentration of SARS-CoV-2 viral copies this assay can detect is 138 copies/mL. A negative result does not preclude SARS-Cov-2 infection and should not be used as the sole basis for treatment or other patient management decisions. A negative result may occur with  improper specimen collection/handling, submission of specimen other than nasopharyngeal swab, presence of viral mutation(s) within the areas targeted by this assay, and inadequate number of viral copies(<138 copies/mL). A negative result must be combined with clinical observations, patient history, and epidemiological information. The expected result is Negative.  Fact Sheet for Patients:  EntrepreneurPulse.com.au  Fact Sheet for Healthcare Providers:  IncredibleEmployment.be  This test is no t yet approved or cleared by the [place] FDA and  has been authorized for detection and/or diagnosis of SARS-CoV-2 by FDA under an Emergency Use Authorization (EUA). This EUA will remain  in effect (meaning this test can be used) for the duration of the COVID-19 declaration under Section 564(b)(1) of the Act, 21 U.S.C.section 360bbb-3(b)(1), unless the authorization is terminated  or revoked sooner.       Influenza A by PCR NEGATIVE NEGATIVE Final   Influenza B by PCR NEGATIVE NEGATIVE Final    Comment: (NOTE) The Xpert Xpress SARS-CoV-2/FLU/RSV plus assay is intended as an aid in the diagnosis of influenza from Nasopharyngeal swab specimens and should not be used as a sole basis for treatment. Nasal washings and aspirates  are unacceptable for Xpert Xpress SARS-CoV-2/FLU/RSV testing.  Fact Sheet for Patients: EntrepreneurPulse.com.au  Fact Sheet for  Healthcare Providers: IncredibleEmployment.be  This test is not yet approved or cleared by the [place] and has been authorized for detection and/or diagnosis of SARS-CoV-2 by FDA under an Emergency Use Authorization (EUA). This EUA will remain in effect (meaning this test can be used) for the duration of the COVID-19 declaration under Section 564(b)(1) of the Act, 21 U.S.C. section 360bbb-3(b)(1), unless the authorization is terminated or revoked.  Performed at [facility], [address] [place], [place] [address]      Labs: BNP (last 3 results) Recent Labs    07/24/21 1011  BNP 2,458.0*   Basic Metabolic Panel: Recent Labs  Lab 07/25/21 0647 07/25/21 0834 07/26/21 0522 07/27/21 0455 07/28/21 0448  NA 137 137 137 136 136  K 3.1* 3.3* 3.3* 3.8 3.3*  CL 102 101 101 104 99  CO2 25 26 25 28 26   GLUCOSE 69* 73 92 104* 108*  BUN 39* 37* 35* 30* 27*  CREATININE 1.84* 1.91* 1.71* 1.66* 1.70*  CALCIUM 8.2* 8.3* 8.0* 8.8* 8.5*  MG  --  1.0* 1.9  --   --    Liver Function Tests: Recent Labs  Lab 07/24/21 1011 07/25/21 0647 07/25/21 0834  AST 29 25 26   ALT 13 13 13   ALKPHOS 49 44 47  BILITOT 1.4* 1.5* 1.7*  PROT 6.7 6.5 6.9  ALBUMIN 3.7 3.7 3.7   No results for input(s): LIPASE, AMYLASE in the last 168 hours. No results for input(s): AMMONIA in the last 168 hours. CBC: Recent Labs  Lab 07/25/21 1900 07/26/21 0522 07/26/21 1534 07/27/21 0455 07/28/21 0448  WBC 4.7 3.6* 4.1 4.1 4.1  NEUTROABS  --   --   --   --  2.7  HGB 8.7* 7.4* 7.9* 7.2* 8.9*  HCT 26.5* 22.9* 24.3* 22.3* 27.1*  MCV 92.7 90.9 92.7 90.7 90.6  PLT 132* 83* 100* 93* 93*   Cardiac Enzymes: No results for input(s): CKTOTAL, CKMB, CKMBINDEX, TROPONINI in the last 168 hours. BNP: Invalid input(s):  POCBNP CBG: Recent Labs  Lab 07/27/21 0803 07/27/21 1102 07/27/21 1626 07/27/21 2053 07/28/21 0742  GLUCAP 108* 170* 125* 192* 99   D-Dimer No results for input(s): DDIMER in the last 72 hours. Hgb A1c No results for input(s): HGBA1C in the last 72 hours. Lipid Profile No results for input(s): CHOL, HDL, LDLCALC, TRIG, CHOLHDL, LDLDIRECT in the last 72 hours. Thyroid function studies No results for input(s): TSH, T4TOTAL, T3FREE, THYROIDAB in the last 72 hours.  Invalid input(s): FREET3 Anemia work up No results for input(s): VITAMINB12, FOLATE, FERRITIN, TIBC, IRON, RETICCTPCT in the last 72 hours. Urinalysis    Component Value Date/Time   COLORURINE RED (A) 07/24/2021 1308   APPEARANCEUR TURBID (A) 07/24/2021 1308   APPEARANCEUR Cloudy (A) 07/16/2021 0846   LABSPEC 1.010 07/24/2021 1308   PHURINE  07/24/2021 1308    TEST NOT REPORTED DUE TO COLOR INTERFERENCE OF URINE PIGMENT   GLUCOSEU (A) 07/24/2021 1308    TEST NOT REPORTED DUE TO COLOR INTERFERENCE OF URINE PIGMENT   HGBUR (A) 07/24/2021 1308    TEST NOT REPORTED DUE TO COLOR INTERFERENCE OF URINE PIGMENT   BILIRUBINUR (A) 07/24/2021 1308    TEST NOT REPORTED DUE TO COLOR INTERFERENCE OF URINE PIGMENT   BILIRUBINUR Negative 07/16/2021 0846   KETONESUR (A) 07/24/2021 1308    TEST NOT REPORTED DUE TO COLOR INTERFERENCE OF URINE PIGMENT   PROTEINUR (A) 07/24/2021 1308    TEST NOT REPORTED DUE TO COLOR INTERFERENCE  OF URINE PIGMENT   NITRITE (A) 07/24/2021 1308    TEST NOT REPORTED DUE TO COLOR INTERFERENCE OF URINE PIGMENT   LEUKOCYTESUR (A) 07/24/2021 1308    TEST NOT REPORTED DUE TO COLOR INTERFERENCE OF URINE PIGMENT   Sepsis Labs Invalid input(s): PROCALCITONIN,  WBC,  LACTICIDVEN Microbiology Recent Results (from the past 240 hour(s))  Resp Panel by RT-PCR (Flu A&B, Covid) Nasopharyngeal Swab     Status: None   Collection Time: 07/24/21 12:32 PM   Specimen: Nasopharyngeal Swab; Nasopharyngeal(NP) swabs in  vial transport medium  Result Value Ref Range Status   SARS Coronavirus 2 by RT PCR NEGATIVE NEGATIVE Final    Comment: (NOTE) SARS-CoV-2 target nucleic acids are NOT DETECTED.  The SARS-CoV-2 RNA is generally detectable in upper respiratory specimens during the acute phase of infection. The lowest concentration of SARS-CoV-2 viral copies this assay can detect is 138 copies/mL. A negative result does not preclude SARS-Cov-2 infection and should not be used as the sole basis for treatment or other patient management decisions. A negative result may occur with  improper specimen collection/handling, submission of specimen other than nasopharyngeal swab, presence of viral mutation(s) within the areas targeted by this assay, and inadequate number of viral copies(<138 copies/mL). A negative result must be combined with clinical observations, patient history, and epidemiological information. The expected result is Negative.  Fact Sheet for Patients:  EntrepreneurPulse.com.au  Fact Sheet for Healthcare Providers:  IncredibleEmployment.be  This test is no t yet approved or cleared by the [place] FDA and  has been authorized for detection and/or diagnosis of SARS-CoV-2 by FDA under an Emergency Use Authorization (EUA). This EUA will remain  in effect (meaning this test can be used) for the duration of the COVID-19 declaration under Section 564(b)(1) of the Act, 21 U.S.C.section 360bbb-3(b)(1), unless the authorization is terminated  or revoked sooner.       Influenza A by PCR NEGATIVE NEGATIVE Final   Influenza B by PCR NEGATIVE NEGATIVE Final    Comment: (NOTE) The Xpert Xpress SARS-CoV-2/FLU/RSV plus assay is intended as an aid in the diagnosis of influenza from Nasopharyngeal swab specimens and should not be used as a sole basis for treatment. Nasal washings and aspirates are unacceptable for Xpert Xpress SARS-CoV-2/FLU/RSV testing.  Fact  Sheet for Patients: EntrepreneurPulse.com.au  Fact Sheet for Healthcare Providers: IncredibleEmployment.be  This test is not yet approved or cleared by the [place] FDA and has been authorized for detection and/or diagnosis of SARS-CoV-2 by FDA under an Emergency Use Authorization (EUA). This EUA will remain in effect (meaning this test can be used) for the duration of the COVID-19 declaration under Section 564(b)(1) of the Act, 21 U.S.C. section 360bbb-3(b)(1), unless the authorization is terminated or revoked.  Performed at [facility], [address], [place], [place] [address]     Please note: You were cared for by a hospitalist during your hospital stay. Once you are discharged, your primary care physician will handle any further medical issues. Please note that NO REFILLS for any discharge medications will be authorized once you are discharged, as it is imperative that you return to your primary care physician (or establish a relationship with a primary care physician if you do not have one) for your post hospital discharge needs so that they can reassess your need for medications and monitor your lab values.    Time coordinating discharge: 40 minutes  SIGNED:   Shelly Coss, MD  [facility] 07/28/2021, 9:55 AM Pager  [phone]  If 7PM-7AM, please contact night-coverage [email or web address] Password [email or web address]

## 2021-07-30 DIAGNOSIS — D61818 Other pancytopenia: Secondary | ICD-10-CM | POA: Insufficient documentation

## 2021-07-30 NOTE — Progress Notes (Signed)
[place]  Telephone:(336) [phone] Fax:([phone]) [phone]  ID: Walter Blair OB: 12/12/1945  MR#: [id number]  CSN#:[id number]  Patient Care Team: Jodi Marble, MD as PCP - General (Internal Medicine)  CHIEF COMPLAINT: Pancytopenia.  INTERVAL HISTORY: Patient is a 75 year old male who was initially evaluated in the hospital who returns to clinic 1 week after discharge.  He feels improved since his hospitalization, but not back to his baseline.  He continues to have weakness and fatigue.  He has no neurologic complaints.  He denies any recent fevers.  He has a good appetite and denies weight loss.  He has no chest pain, shortness of breath, cough, or hemoptysis.  He denies any nausea, vomiting, constipation, or diarrhea.  He has no melena or hematochezia.  He has no urinary complaints.  Patient offers no further specific complaints today.  REVIEW OF SYSTEMS:   Review of Systems  Constitutional:  Positive for malaise/fatigue. Negative for fever and weight loss.  Respiratory: Negative.  Negative for cough, hemoptysis and shortness of breath.   Cardiovascular: Negative.  Negative for chest pain and leg swelling.  Gastrointestinal: Negative.  Negative for abdominal pain.  Genitourinary: Negative.  Negative for dysuria.  Musculoskeletal: Negative.  Negative for back pain.  Skin: Negative.  Negative for rash.  Neurological:  Positive for weakness. Negative for dizziness, focal weakness and headaches.  Psychiatric/Behavioral: Negative.  The patient is not nervous/anxious.    As per HPI. Otherwise, a complete review of systems is negative.  PAST MEDICAL HISTORY: Past Medical History:  Diagnosis Date   CAD (coronary artery disease)    Diabetes ([place])    H/O: stroke    HTN (hypertension)     PAST SURGICAL HISTORY: Past Surgical History:  Procedure Laterality Date   CARDIAC DEFIBRILLATOR PLACEMENT     CARDIOVERSION N/A 07/01/2017   Procedure: CARDIOVERSION;  Surgeon:  Dionisio David, MD;  Location: [facility];  Service: Cardiovascular;  Laterality: N/A;   CARDIOVERSION N/A 07/05/2017   Procedure: CARDIOVERSION;  Surgeon: Dionisio David, MD;  Location: [facility];  Service: Cardiovascular;  Laterality: N/A;   COLONOSCOPY WITH PROPOFOL N/A 12/30/2020   Procedure: COLONOSCOPY WITH PROPOFOL;  Surgeon: [name], MD;  Location: [facility] ENDOSCOPY;  Service: Gastroenterology;  Laterality: N/A;   CORONARY ARTERY BYPASS GRAFT     ESOPHAGOGASTRODUODENOSCOPY  12/30/2020   Procedure: ESOPHAGOGASTRODUODENOSCOPY (EGD);  Surgeon: [name], MD;  Location: [facility] ENDOSCOPY;  Service: Gastroenterology;;   GIVENS CAPSULE STUDY N/A 02/02/2021   Procedure: GIVENS CAPSULE STUDY;  Surgeon: [name], MD;  Location: [facility] ENDOSCOPY;  Service: Gastroenterology;  Laterality: N/A;  WILL NEED INTERPRETER ON WHEELS;  BROTHER WANTS TO TRANSLATE   TEE WITHOUT CARDIOVERSION N/A 07/01/2017   Procedure: TRANSESOPHAGEAL ECHOCARDIOGRAM (TEE);  Surgeon: Dionisio David, MD;  Location: [facility];  Service: Cardiovascular;  Laterality: N/A;   TEE WITHOUT CARDIOVERSION N/A 07/05/2017   Procedure: TRANSESOPHAGEAL ECHOCARDIOGRAM (TEE);  Surgeon: Dionisio David, MD;  Location: [facility];  Service: Cardiovascular;  Laterality: N/A;    FAMILY HISTORY: Family History  Problem Relation Age of Onset   Diabetes Brother    Hypertension Mother    Diabetes Sister     ADVANCED DIRECTIVES (Y/N):  N  HEALTH MAINTENANCE: Social History   Tobacco Use   Smoking status: Former    Types: Cigarettes   Smokeless tobacco: Former    Types: Chew  Substance Use Topics   Alcohol use: No   Drug use: No     Colonoscopy:  PAP:  Bone density:  Lipid panel:  Allergies  Allergen Reactions   Ativan [Lorazepam] Shortness Of Breath and Other (See Comments)    Pt experienced adverse reaction and was transferred to ICU last time they were given Med    Current Outpatient Medications  Medication Sig Dispense  Refill   amiodarone (PACERONE) 200 MG tablet One tablet twice a day for one week then one tablet daily 44 tablet 0   atorvastatin (LIPITOR) 80 MG tablet Take 80 mg by mouth daily.     ferrous sulfate (FERROUSUL) 325 (65 FE) MG tablet Take 1 tablet twice a day every other day for 90 days. 151 tablet 1   folic acid (FOLVITE) 1 MG tablet Take 1 mg by mouth every morning.     levothyroxine (SYNTHROID) 50 MCG tablet Start one tablet per day. After two weeks, adjust to two tablets per day. Take in the morning on an empty stomach and wait 20 min before eating.     ONETOUCH ULTRA test strip daily.     pantoprazole (PROTONIX) 20 MG tablet Take 1 tablet by mouth daily.     sacubitril-valsartan (ENTRESTO) 24-26 MG Take 1 tablet by mouth 2 (two) times daily. 60 tablet 0   spironolactone (ALDACTONE) 25 MG tablet Take 0.5 tablets (12.5 mg total) by mouth daily. 30 tablet 0   torsemide (DEMADEX) 20 MG tablet Take 1 tablet (20 mg total) by mouth daily.     Vericiguat 5 MG TABS Take by mouth.     glipiZIDE (GLUCOTROL XL) 2.5 MG 24 hr tablet Take 2.5 mg by mouth daily. (Patient not taking: Reported on 08/04/2021)     polyethylene glycol (MIRALAX / GLYCOLAX) packet Take 17 g by mouth daily as needed. (Patient not taking: Reported on 08/04/2021) 30 each 0   potassium chloride SA (KLOR-CON) 20 MEQ tablet Take 1 tablet (20 mEq total) by mouth daily for 7 days. (Patient not taking: Reported on 08/04/2021) 7 tablet 0   No current facility-administered medications for this visit.    OBJECTIVE: Vitals:   08/04/21 1326  BP: 104/63  Pulse: 63  Resp: 17  Temp: 98 F (36.7 C)  SpO2: 100%     Body mass index is 20.66 kg/m.    ECOG FS:1 - Symptomatic but completely ambulatory  General: Well-developed, well-nourished, no acute distress. Eyes: Pink conjunctiva, anicteric sclera. HEENT: Normocephalic, moist mucous membranes. Lungs: No audible wheezing or coughing. Heart: Regular rate and rhythm. Abdomen: Soft,  nontender, no obvious distention. Musculoskeletal: No edema, cyanosis, or clubbing. Neuro: Alert, answering all questions appropriately. Cranial nerves grossly intact. Skin: No rashes or petechiae noted. Psych: Normal affect.  LAB RESULTS:  Lab Results  Component Value Date   NA 132 (L) 08/04/2021   K 5.1 08/04/2021   CL 97 (L) 08/04/2021   CO2 25 08/04/2021   GLUCOSE 94 08/04/2021   BUN 31 (H) 08/04/2021   CREATININE 1.56 (H) 08/04/2021   CALCIUM 9.3 08/04/2021   PROT 8.0 08/04/2021   ALBUMIN 4.1 08/04/2021   AST 27 08/04/2021   ALT 17 08/04/2021   ALKPHOS 65 08/04/2021   BILITOT 1.5 (H) 08/04/2021   GFRNONAA 46 (L) 08/04/2021   GFRAA >60 07/04/2017    Lab Results  Component Value Date   WBC 4.8 08/04/2021   NEUTROABS 2.9 08/04/2021   HGB 9.3 (L) 08/04/2021   HCT 28.8 (L) 08/04/2021   MCV 93.2 08/04/2021   PLT 154 08/04/2021     STUDIES: CT ABDOMEN PELVIS WO  CONTRAST  Result Date: 07/24/2021 CLINICAL DATA:  Abdominal distension, shortness of breath, retained fluid EXAM: CT ABDOMEN AND PELVIS WITHOUT CONTRAST TECHNIQUE: Multidetector CT imaging of the abdomen and pelvis was performed following the standard protocol without IV contrast. COMPARISON:  None. FINDINGS: Lower chest: Cardiomegaly without pericardial effusion. Pacer wires in the right heart. Previous median sternotomy. Native coronary atherosclerosis present. Trace pleural effusions. Mild bibasilar reticulonodular and peripheral tree in bud opacities worse on the left may represent mild bibasilar pneumonitis/pneumonia. Left lower lobe mild cylindrical bronchiectasis noted. Nonspecific nodular pleural thickening on the left. This is incompletely evaluated by abdominal imaging only. Hepatobiliary: Limited without IV contrast. Distended hepatic IVC and hepatic veins compatible with right heart failure. Peri portal edema suspected. No biliary obstruction pattern or large focal hepatic abnormality. Small layering  calcified gallstones noted. Gallbladder nondistended. Common bile duct nondilated. Pancreas: Unremarkable. No pancreatic ductal dilatation or surrounding inflammatory changes. Spleen: Normal in size without focal abnormality. Adrenals/Urinary Tract: Nonspecific adrenal thickening bilaterally. Limited evaluation without contrast. No renal obstruction pattern. Renal vascular calcifications present. No hydroureter or ureteral calculus. No hydronephrosis. Posterior nonspecific bladder wall thickening. Stomach/Bowel: Negative for bowel obstruction, significant dilatation, ileus, or free air. Normal appendix. Small amount of upper abdomen perihepatic and perisplenic ascites. Small amount of dependent pelvic ascites. No focal fluid collection or abscess. Vascular/Lymphatic: Limited without IV contrast. Abdominal atherosclerosis noted. Negative for aneurysm. No retroperitoneal hemorrhage or hematoma. No bulky adenopathy. Reproductive: No significant finding by CT. Other: Intact abdominal wall.  No hernia.  No inguinal abnormality. Musculoskeletal: Degenerative changes of the spine and facet joints. Old compression fracture at L4. Bones are osteopenic. IMPRESSION: Cardiomegaly. Trace pleural effusions and bibasilar reticulonodular/tree in bud mild opacities worse on the left. Difficult to exclude mild bibasilar bronchopneumonia. Small amount abdominopelvic ascites. Cholelithiasis Abdominal atherosclerosis No other acute intra-abdominal finding by noncontrast CT. Electronically Signed   By: Jerilynn Mages.  Shick M.D.   On: 07/24/2021 14:53   DG Chest 2 View  Result Date: 07/24/2021 CLINICAL DATA:  sob EXAM: CHEST - 2 VIEW COMPARISON:  July 05, 2017 FINDINGS: The cardiomediastinal silhouette is unchanged in contour.Status post median sternotomy and CABG. LEFT chest AICD. Small LEFT pleural effusion. No pneumothorax. Mild diffuse interstitial prominence. LEFT retrocardiac opacities. Visualized abdomen is unremarkable. Mild  degenerative changes of the thoracic spine. IMPRESSION: Constellation of findings are favored to reflect mild pulmonary edema with LEFT basilar atelectasis and small LEFT pleural effusion. Electronically Signed   By: Valentino Saxon M.D.   On: 07/24/2021 10:48   ECHOCARDIOGRAM COMPLETE  Result Date: 07/25/2021    ECHOCARDIOGRAM REPORT   Patient Name:   SRIHARI SHELLHAMMER Date of Exam: 07/25/2021 Medical Rec #:  [id number]      Height:       66.0 in Accession #:    [id number]     Weight:       141.1 lb Date of Birth:  10-Oct-1945       BSA:          1.724 m Patient Age:    57 years       BP:           140/60 mmHg Patient Gender: M              HR:           60 bpm. Exam Location:  [facility] Procedure: 2D Echo, Strain Analysis and 3D Echo Indications:     CHF I50.21  History:  Patient has no prior history of Echocardiogram examinations.  Sonographer:     Kathlen Brunswick RDCS Referring Phys:  Unknown Foley NIU Diagnosing Phys: Neoma Laming  Sonographer Comments: Global longitudinal strain was attempted. IMPRESSIONS  1. Left ventricular ejection fraction, by estimation, is 20 to 25%. The left ventricle has severely decreased function. The left ventricle demonstrates global hypokinesis. The left ventricular internal cavity size was severely dilated. There is mild left ventricular hypertrophy. Left ventricular diastolic parameters are consistent with Grade III diastolic dysfunction (restrictive).  2. Right ventricular systolic function is severely reduced. The right ventricular size is severely enlarged. Mildly increased right ventricular wall thickness. There is mildly elevated pulmonary artery systolic pressure.  3. Left atrial size was severely dilated.  4. Right atrial size was severely dilated.  5. The mitral valve is abnormal. Moderate to severe mitral valve regurgitation.  6. Tricuspid valve regurgitation is severe.  7. The aortic valve is calcified. Aortic valve regurgitation is mild. Mild aortic valve sclerosis is  present, with no evidence of aortic valve stenosis. Conclusion(s)/Recommendation(s): Findings consistent with ischemic cardiomyopathy. FINDINGS  Left Ventricle: Left ventricular ejection fraction, by estimation, is 20 to 25%. The left ventricle has severely decreased function. The left ventricle demonstrates global hypokinesis. Definity contrast agent was given IV to delineate the left ventricular endocardial borders. The left ventricular internal cavity size was severely dilated. There is mild left ventricular hypertrophy. Left ventricular diastolic parameters are consistent with Grade III diastolic dysfunction (restrictive). Right Ventricle: The right ventricular size is severely enlarged. Mildly increased right ventricular wall thickness. Right ventricular systolic function is severely reduced. There is mildly elevated pulmonary artery systolic pressure. Left Atrium: Left atrial size was severely dilated. Right Atrium: Right atrial size was severely dilated. Pericardium: The pericardium was not well visualized. Mitral Valve: The mitral valve is abnormal. Moderate to severe mitral valve regurgitation. Tricuspid Valve: The tricuspid valve is grossly normal. Tricuspid valve regurgitation is severe. Aortic Valve: The aortic valve is calcified. Aortic valve regurgitation is mild. Aortic regurgitation PHT measures 621 msec. Mild aortic valve sclerosis is present, with no evidence of aortic valve stenosis. Aortic valve peak gradient measures 4.2 mmHg. Pulmonic Valve: The pulmonic valve was grossly normal. Pulmonic valve regurgitation is mild to moderate. Aorta: The aortic root, ascending aorta and aortic arch are all structurally normal, with no evidence of dilitation or obstruction. IAS/Shunts: The interatrial septum was not well visualized.  LEFT VENTRICLE PLAX 2D LVIDd:         6.09 cm      Diastology LVIDs:         5.65 cm      LV e' lateral:   8.16 cm/s LV PW:         0.76 cm      LV E/e' lateral: 11.3 LV IVS:         0.76 cm LVOT diam:     2.00 cm LV SV:         31 LV SV Index:   18 LVOT Area:     3.14 cm     3D Volume EF:                             3D EF:        24 %                             LV EDV:  159 ml LV Volumes (MOD)            LV ESV:       121 ml LV vol d, MOD A2C: 133.0 ml LV SV:        38 ml LV vol d, MOD A4C: 148.0 ml LV vol s, MOD A2C: 105.0 ml LV vol s, MOD A4C: 114.0 ml LV SV MOD A2C:     28.0 ml LV SV MOD A4C:     148.0 ml LV SV MOD BP:      32.7 ml RIGHT VENTRICLE RV Basal diam:  3.69 cm RV S prime:     8.27 cm/s TAPSE (M-mode): 1.2 cm LEFT ATRIUM             Index        RIGHT ATRIUM           Index LA diam:        5.10 cm 2.96 cm/m   RA Area:     30.00 cm LA Vol (A2C):   56.0 ml 32.48 ml/m  RA Volume:   118.00 ml 68.44 ml/m LA Vol (A4C):   98.0 ml 56.84 ml/m LA Biplane Vol: 79.1 ml 45.88 ml/m  AORTIC VALVE                 PULMONIC VALVE AV Area (Vmax): 1.57 cm     PV Vmax:          0.92 m/s AV Vmax:        103.00 cm/s  PV Peak grad:     3.4 mmHg AV Peak Grad:   4.2 mmHg     PR End Diast Vel: 4.49 msec LVOT Vmax:      51.60 cm/s LVOT Vmean:     31.100 cm/s LVOT VTI:       0.098 m AI PHT:         621 msec  AORTA Ao Root diam: 2.80 cm Ao Asc diam:  3.20 cm MITRAL VALVE                  TRICUSPID VALVE MV Area (PHT): 3.56 cm       TV Peak grad:   17.5 mmHg MV Decel Time: 213 msec       TV Vmax:        2.09 m/s MR Peak grad:    66.6 mmHg MR Mean grad:    41.0 mmHg    SHUNTS MR Vmax:         408.00 cm/s  Systemic VTI:  0.10 m MR Vmean:        299.0 cm/s   Systemic Diam: 2.00 cm MR PISA:         1.01 cm MR PISA Eff ROA: 8 mm MR PISA Radius:  0.40 cm MV E velocity: 92.50 cm/s MV A velocity: 21.40 cm/s MV E/A ratio:  4.32 [employer] signed by Neoma Laming Signature Date/Time: 07/25/2021/11:55:53 AM    Final     ASSESSMENT: Pancytopenia  PLAN:    Anemia: His most recent hemoglobin prior to admission in June 2022 was reported 10.2.  Upon admission patient's hemoglobin was 7.9  and is trended down slightly to 7.3.  At discharge, his hemoglobin has improved to 8.9 and continues to trend up to 9.3.  Given his mildly elevated total bilirubin, he may have some ongoing mild hemolysis.  Previously, schistocytes seen on peripheral smear last week possibly related to underlying DIC.  ADAMTS13 is negative  essentially ruling out TTP.  Patient does not require bone marrow biopsy at this time.  Return to clinic in 4 months for repeat laboratory work and further evaluation.   Thrombocytopenia: Resolved.  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 Coagulopathy: Possibly underlying DIC.  Monitor. Hematuria: Noncontrast CT did not reveal any significant pathology.  Patient is now off Eliquis. Renal insufficiency: Case discussed with nephrology.  Creatinine improved to 1.56.  Previously his baseline is approximately 1.4.  Follow-up with nephrology as scheduled.   CHF exacerbation, fluid overload: Resolved.  Appreciate cardiology input.  Patient expressed understanding and was in agreement with this plan. He also understands that He can call clinic at any time with any questions, concerns, or complaints.    Lloyd Huger, MD   08/04/2021 4:32 PM

## 2021-08-03 ENCOUNTER — Other Ambulatory Visit: Payer: Self-pay | Admitting: Emergency Medicine

## 2021-08-03 DIAGNOSIS — D61818 Other pancytopenia: Secondary | ICD-10-CM

## 2021-08-03 DIAGNOSIS — D696 Thrombocytopenia, unspecified: Secondary | ICD-10-CM

## 2021-08-03 DIAGNOSIS — D509 Iron deficiency anemia, unspecified: Secondary | ICD-10-CM

## 2021-08-04 ENCOUNTER — Inpatient Hospital Stay: Payer: Medicare Other | Attending: Oncology

## 2021-08-04 ENCOUNTER — Other Ambulatory Visit: Payer: Self-pay

## 2021-08-04 ENCOUNTER — Encounter: Payer: Self-pay | Admitting: Oncology

## 2021-08-04 ENCOUNTER — Inpatient Hospital Stay (HOSPITAL_BASED_OUTPATIENT_CLINIC_OR_DEPARTMENT_OTHER): Payer: Medicare Other | Admitting: Oncology

## 2021-08-04 VITALS — BP 104/63 | HR 63 | Temp 98.0°F | Resp 17 | Wt 128.0 lb

## 2021-08-04 DIAGNOSIS — K8020 Calculus of gallbladder without cholecystitis without obstruction: Secondary | ICD-10-CM | POA: Insufficient documentation

## 2021-08-04 DIAGNOSIS — D61818 Other pancytopenia: Secondary | ICD-10-CM

## 2021-08-04 DIAGNOSIS — R319 Hematuria, unspecified: Secondary | ICD-10-CM | POA: Diagnosis not present

## 2021-08-04 DIAGNOSIS — N289 Disorder of kidney and ureter, unspecified: Secondary | ICD-10-CM | POA: Diagnosis not present

## 2021-08-04 DIAGNOSIS — R531 Weakness: Secondary | ICD-10-CM | POA: Diagnosis not present

## 2021-08-04 DIAGNOSIS — D509 Iron deficiency anemia, unspecified: Secondary | ICD-10-CM | POA: Diagnosis not present

## 2021-08-04 DIAGNOSIS — D696 Thrombocytopenia, unspecified: Secondary | ICD-10-CM

## 2021-08-04 DIAGNOSIS — I083 Combined rheumatic disorders of mitral, aortic and tricuspid valves: Secondary | ICD-10-CM | POA: Insufficient documentation

## 2021-08-04 DIAGNOSIS — J90 Pleural effusion, not elsewhere classified: Secondary | ICD-10-CM | POA: Diagnosis not present

## 2021-08-04 DIAGNOSIS — I10 Essential (primary) hypertension: Secondary | ICD-10-CM | POA: Insufficient documentation

## 2021-08-04 DIAGNOSIS — Z7984 Long term (current) use of oral hypoglycemic drugs: Secondary | ICD-10-CM | POA: Diagnosis not present

## 2021-08-04 DIAGNOSIS — J479 Bronchiectasis, uncomplicated: Secondary | ICD-10-CM | POA: Insufficient documentation

## 2021-08-04 DIAGNOSIS — I110 Hypertensive heart disease with heart failure: Secondary | ICD-10-CM | POA: Diagnosis not present

## 2021-08-04 DIAGNOSIS — I2510 Atherosclerotic heart disease of native coronary artery without angina pectoris: Secondary | ICD-10-CM | POA: Insufficient documentation

## 2021-08-04 DIAGNOSIS — Z87891 Personal history of nicotine dependence: Secondary | ICD-10-CM | POA: Diagnosis not present

## 2021-08-04 DIAGNOSIS — E119 Type 2 diabetes mellitus without complications: Secondary | ICD-10-CM | POA: Insufficient documentation

## 2021-08-04 DIAGNOSIS — Z79899 Other long term (current) drug therapy: Secondary | ICD-10-CM | POA: Diagnosis not present

## 2021-08-04 LAB — COMPREHENSIVE METABOLIC PANEL
ALT: 17 U/L (ref 0–44)
AST: 27 U/L (ref 15–41)
Albumin: 4.1 g/dL (ref 3.5–5.0)
Alkaline Phosphatase: 65 U/L (ref 38–126)
Anion gap: 10 (ref 5–15)
BUN: 31 mg/dL — ABNORMAL HIGH (ref 8–23)
CO2: 25 mmol/L (ref 22–32)
Calcium: 9.3 mg/dL (ref 8.9–10.3)
Chloride: 97 mmol/L — ABNORMAL LOW (ref 98–111)
Creatinine, Ser: 1.56 mg/dL — ABNORMAL HIGH (ref 0.61–1.24)
GFR, Estimated: 46 mL/min — ABNORMAL LOW (ref >60)
Glucose, Bld: 94 mg/dL (ref 70–99)
Potassium: 5.1 mmol/L (ref 3.5–5.1)
Sodium: 132 mmol/L — ABNORMAL LOW (ref 135–145)
Total Bilirubin: 1.5 mg/dL — ABNORMAL HIGH (ref 0.3–1.2)
Total Protein: 8 g/dL (ref 6.5–8.1)

## 2021-08-04 LAB — CBC WITH DIFFERENTIAL/PLATELET
Abs Immature Granulocytes: 0.01 10*3/uL (ref 0.00–0.07)
Basophils Absolute: 0 10*3/uL (ref 0.0–0.1)
Basophils Relative: 1 %
Eosinophils Absolute: 0.1 10*3/uL (ref 0.0–0.5)
Eosinophils Relative: 2 %
HCT: 28.8 % — ABNORMAL LOW (ref 39.0–52.0)
Hemoglobin: 9.3 g/dL — ABNORMAL LOW (ref 13.0–17.0)
Immature Granulocytes: 0 %
Lymphocytes Relative: 21 %
Lymphs Abs: 1 10*3/uL (ref 0.7–4.0)
MCH: 30.1 pg (ref 26.0–34.0)
MCHC: 32.3 g/dL (ref 30.0–36.0)
MCV: 93.2 fL (ref 80.0–100.0)
Monocytes Absolute: 0.8 10*3/uL (ref 0.1–1.0)
Monocytes Relative: 16 %
Neutro Abs: 2.9 10*3/uL (ref 1.7–7.7)
Neutrophils Relative %: 60 %
Platelets: 154 10*3/uL (ref 150–400)
RBC: 3.09 MIL/uL — ABNORMAL LOW (ref 4.22–5.81)
RDW: 16.1 % — ABNORMAL HIGH (ref 11.5–15.5)
WBC: 4.8 10*3/uL (ref 4.0–10.5)
nRBC: 0 % (ref 0.0–0.2)

## 2021-08-04 LAB — VITAMIN B12: Vitamin B-12: 176 pg/mL — ABNORMAL LOW (ref 180–914)

## 2021-08-04 LAB — FOLATE: Folate: 39 ng/mL (ref >5.9)

## 2021-08-04 NOTE — Progress Notes (Signed)
Patient here for oncology follow-up appointment, expresses concerns of constipation and hematuria(has apt tomorrow with urologist)

## 2021-08-05 ENCOUNTER — Other Ambulatory Visit: Payer: Self-pay | Admitting: Urology

## 2021-08-05 ENCOUNTER — Encounter: Payer: Self-pay | Admitting: Urgent Care

## 2021-08-05 ENCOUNTER — Ambulatory Visit (INDEPENDENT_AMBULATORY_CARE_PROVIDER_SITE_OTHER): Payer: Medicare Other | Admitting: Urology

## 2021-08-05 ENCOUNTER — Encounter: Payer: Self-pay | Admitting: Urology

## 2021-08-05 VITALS — BP 100/60 | HR 68 | Ht 66.0 in | Wt 128.0 lb

## 2021-08-05 DIAGNOSIS — R310 Gross hematuria: Secondary | ICD-10-CM | POA: Diagnosis not present

## 2021-08-05 DIAGNOSIS — D494 Neoplasm of unspecified behavior of bladder: Secondary | ICD-10-CM

## 2021-08-05 DIAGNOSIS — N189 Chronic kidney disease, unspecified: Secondary | ICD-10-CM | POA: Insufficient documentation

## 2021-08-05 DIAGNOSIS — D61818 Other pancytopenia: Secondary | ICD-10-CM | POA: Diagnosis not present

## 2021-08-05 HISTORY — DX: Neoplasm of unspecified behavior of bladder: D49.4

## 2021-08-05 LAB — URINALYSIS, COMPLETE
Bilirubin, UA: NEGATIVE
Glucose, UA: NEGATIVE
Ketones, UA: NEGATIVE
Leukocytes,UA: NEGATIVE
Nitrite, UA: NEGATIVE
Specific Gravity, UA: 1.01 (ref 1.005–1.030)
Urobilinogen, Ur: 0.2 mg/dL (ref 0.2–1.0)
pH, UA: 5.5 (ref 5.0–7.5)

## 2021-08-05 LAB — MICROSCOPIC EXAMINATION: RBC, Urine: >30 /hpf — AB (ref 0–2)

## 2021-08-05 MED ORDER — LIDOCAINE HCL URETHRAL/MUCOSAL 2 % EX GEL
1.0000 "application " | Freq: Once | CUTANEOUS | Status: AC
  Administered 2021-08-05: 1 via URETHRAL

## 2021-08-05 NOTE — Progress Notes (Addendum)
[facility] Posting Form   Surgery Date/Time: 08/07/2021  Surgeon: Dr. Nickolas Madrid, MD  Surgery Location: Day Surgery  Inpt ( No  )   Outpt (Yes)   Obs ( No  )   Diagnosis: D49.4 Bladder Tumor  -CPT: 80970  Surgery: TURBT   Stop Anticoagulations: Yes  Cardiac/Medical/Pulmonary Clearance needed: Yes  Clearance needed from Dr: Humphrey Rolls and Dr. Grayland Ormond  Clearance request sent on: Date: 08/05/21 and Received today 08/05/21;   *Orders entered into EPIC  Date: 08/05/21   *Case booked in EPIC  Date: 08/05/21  *Notified pt of Surgery: Date: 08/05/21  PRE-OP UA & CX: CBC and BMP- obtained in Hematology on 11/15  *Placed into Prior Authorization Work Fabio Bering Date: 08/05/21   Assistant/laser/rep:No

## 2021-08-05 NOTE — Patient Instructions (Signed)
Transurethral Resection of Bladder Tumor Transurethral resection of a bladder tumor is the removal (resection) of a cancerous growth (tumor) on the inside wall of the bladder. The bladder is the organ that holds urine. The tumor is removed through the tube that carries urine out of the body (urethra). In a transurethral resection, a thin telescope with a light, a tiny camera, and an electric cutting edge (resectoscope) is passed through the urethra. In men, the opening of the urethra is at the end of the penis. In women, it is just above the opening of the vagina. Tell a health care provider about: Any allergies you have. All medicines you are taking, including vitamins, herbs, eye drops, creams, and over-the-counter medicines. Any problems you or family members have had with anesthetic medicines. Any blood disorders you have. Any surgeries you have had. Any medical conditions you have. Any recent urinary tract infections you have had. Whether you are pregnant or may be pregnant. What are the risks? Generally, this is a safe procedure. However, problems may occur, including: Infection. Bleeding. Allergic reactions to medicines. Damage to nearby structures or organs, such as: The urethra. The tubes that drain urine from the kidneys into the bladder (ureters). Pain and burning during urination. Difficulty urinating due to partial blockage of the urethra. Inability to urinate (urinary retention). What happens before the procedure? Staying hydrated Follow instructions from your health care provider about hydration, which may include: Up to 2 hours before the procedure - you may continue to drink clear liquids, such as water, clear fruit juice, black coffee, and plain tea.  Eating and drinking restrictions Follow instructions from your health care provider about eating and drinking, which may include: 8 hours before the procedure - stop eating heavy meals or foods, such as meat, fried foods,  or fatty foods. 6 hours before the procedure - stop eating light meals or foods, such as toast or cereal. 6 hours before the procedure - stop drinking milk or drinks that contain milk. 2 hours before the procedure - stop drinking clear liquids. Medicines Ask your health care provider about: Changing or stopping your regular medicines. This is especially important if you are taking diabetes medicines or blood thinners. Taking medicines such as aspirin and ibuprofen. These medicines can thin your blood. Do not take these medicines unless your health care provider tells you to take them. Taking over-the-counter medicines, vitamins, herbs, and supplements. Tests You may have exams or tests, including: Physical exam. Blood tests. Urine tests. Electrocardiogram (ECG). This test measures the electrical activity of the heart. General instructions Plan to have someone take you home from the hospital or clinic. Ask your health care provider how your surgical site will be marked or identified. Ask your health care provider what steps will be taken to help prevent infection. These may include: Washing skin with a germ-killing soap. Taking antibiotic medicine. What happens during the procedure? An IV will be inserted into one of your veins. You will be given one or more of the following: A medicine to help you relax (sedative). A medicine to make you fall asleep (general anesthetic). A medicine that is injected into your spine to numb the area below and slightly above the injection site (spinal anesthetic). Your legs will be placed in foot rests (stirrups) so that your legs are apart and your knees are bent. The resectoscope will be passed through your urethra and into your bladder. The part of your bladder that is affected by the tumor will be  resected using the cutting edge of the resectoscope. The resectoscope will be removed. A thin, flexible tube (catheter) will be passed through your urethra  and into your bladder. The catheter will drain urine into a bag outside of your body. Fluid may be passed through the catheter to keep the catheter open. The procedure may vary among health care providers and hospitals. What happens after the procedure? Your blood pressure, heart rate, breathing rate, and blood oxygen level will be monitored until you leave the hospital or clinic. You may continue to receive fluids and medicines through an IV. You will have some pain. You will be given pain medicine to relieve pain. You will have a catheter to drain your urine. You will have blood in your urine. Your catheter may be kept in until your urine is clear. The amount of urine will be monitored. If necessary, your bladder may be rinsed out (irrigated) by passing fluid through your catheter. You will be encouraged to walk around as soon as possible. You may have to wear compression stockings. These stockings help to prevent blood clots and reduce swelling in your legs. Do not drive for 24 hours if you were given a sedative during your procedure. Summary Transurethral resection of a bladder tumor is the removal (resection) of a cancerous growth (tumor) on the inside wall of the bladder. To do this procedure, your health care provider uses a thin telescope with a light, a tiny camera, and an electric cutting edge (resectoscope). Follow your health care provider's instructions. You may need to stop or change certain medicines, and you may be told to stop eating and drinking several hours before the procedure. Your blood pressure, heart rate, breathing rate, and blood oxygen level will be monitored until you leave the hospital or clinic. You may have to wear compression stockings. These stockings help to prevent blood clots and reduce swelling in your legs. This information is not intended to replace advice given to you by your health care provider. Make sure you discuss any questions you have with your  health care provider. Document Revised: 04/06/2018 Document Reviewed: 04/07/2018 Elsevier Patient Education  [place].   Transurethral Resection of Bladder Tumor, Care After This sheet gives you information about how to care for yourself after your procedure. Your health care provider may also give you more specific instructions. If you have problems or questions, contact your health care provider. What can I expect after the procedure? After the procedure, it is common to have: A small amount of blood in your urine for up to 2 weeks. Soreness or mild pain from your catheter. After your catheter is removed, you may have mild soreness, especially when urinating. Pain in your lower abdomen. Follow these instructions at home: Medicines  Take over-the-counter and prescription medicines only as told by your health care provider. If you were prescribed an antibiotic medicine, take it as told by your health care provider. Do not stop taking the antibiotic even if you start to feel better. Do not drive for 24 hours if you were given a sedative during your procedure. Ask your health care provider if the medicine prescribed to you: Requires you to avoid driving or using heavy machinery. Can cause constipation. You may need to take these actions to prevent or treat constipation: Take over-the-counter or prescription medicines. Eat foods that are high in fiber, such as beans, whole grains, and fresh fruits and vegetables. Limit foods that are high in fat and processed sugars, such  as fried or sweet foods. Activity Return to your normal activities as told by your health care provider. Ask your health care provider what activities are safe for you. Do not lift anything that is heavier than 10 lb (4.5 kg), or the limit that you are told, until your health care provider says that it is safe. Avoid intense physical activity for as long as told by your health care provider. Rest as told by your  health care provider. Avoid sitting for a long time without moving. Get up to take short walks every 1-2 hours. This is important to improve blood flow and breathing. Ask for help if you feel weak or unsteady. General instructions  Do not drink alcohol for as long as told by your health care provider. This is especially important if you are taking prescription pain medicines. Do not take baths, swim, or use a hot tub until your health care provider approves. Ask your health care provider if you may take showers. You may only be allowed to take sponge baths. If you have a catheter, follow instructions from your health care provider about caring for your catheter and your drainage bag. Drink enough fluid to keep your urine pale yellow. Wear compression stockings as told by your health care provider. These stockings help to prevent blood clots and reduce swelling in your legs. Keep all follow-up visits as told by your health care provider. This is important. You will need to be followed closely with regular checks of your bladder and urethra (cystoscopies) to make sure that the cancer does not come back. Contact a health care provider if: You have pain that gets worse or does not improve with medicine. You have blood in your urine for more than 2 weeks. You have cloudy or bad-smelling urine. You become constipated. Signs of constipation may include having: Fewer than three bowel movements in a week. Difficulty having a bowel movement. Stools that are dry, hard, or larger than normal. You have a fever. Get help right away if: You have: Severe pain. Bright red blood in your urine. Blood clots in your urine. A lot of blood in your urine. Your catheter has been removed and you are not able to urinate. You have a catheter in place and the catheter is not draining urine. Summary After your procedure, it is common to have a small amount of blood in your urine, soreness or mild pain from your  catheter, and pain in your lower abdomen. Take over-the-counter and prescription medicines only as told by your health care provider. Rest as told by your health care provider. Follow your health care provider's instructions about returning to normal activities. Ask what activities are safe for you. If you have a catheter, follow instructions from your health care provider about caring for your catheter and your drainage bag. Get help right away if you cannot urinate, you have severe pain, or you have bright red blood or blood clots in your urine. This information is not intended to replace advice given to you by your health care provider. Make sure you discuss any questions you have with your health care provider. Document Revised: 04/06/2018 Document Reviewed: 04/06/2018 Elsevier Patient Education  [place].

## 2021-08-05 NOTE — Progress Notes (Signed)
Cystoscopy Procedure Note:  Indication: Gross hematuria  Recently admitted 11/4 through 11/8 for CHF exacerbation(EF 15%), DIC/thrombocytopenia(following with oncology outpatient now), AKI on CKD(most recent creatinine 1.56/EGFR 46).  Xarelto was held secondary to hematuria.  After informed consent and discussion of the procedure and its risks, Walter Blair was positioned and prepped in the standard fashion.  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  Cystoscopy was performed with a flexible cystoscope. The urethra, bladder neck and entire bladder was visualized in a standard fashion. The prostate was small to moderate in size.  Vision was very poor secondary to bloody urine, and 200 mL urine were aspirated from the scope.  The bladder was then filled with saline which improved vision.  On retroflexion I could visualize a papillary ~2-3cm tumor at the left bladder base with small blood clots.  Cytology and culture sent.  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  CT shows subtle bladder wall thickening, but on cystoscopy there is a 2 to 3 cm tumor at the left bladder base that is the likely source of his gross hematuria and anemia.  I spoke with both his brother, and another family member that is a Lexicographer.  We discussed transurethral resection of bladder tumor (TURBT) and risks and benefits at length. This is typically a 1  to 2-hour procedure done under general anesthesia in the operating room.  A scope is inserted through the urethra and used to resect abnormal tissue within the bladder, which is then sent to the pathologist to determine grade and stage of the tumor.  Risks include bleeding, infection, need for temporary Foley placement, and bladder perforation.  Treatment strategies are based on the type of tumor and depth of invasion.  We briefly reviewed the different treatment pathways for non-muscle invasive and muscle invasive bladder cancer.  We discussed the complexities of moving forward with TURBT with his comorbidities, and need for optimization, as well as possible consideration of spinal anesthetic instead of general anesthesia pending discussion with cardiology and anesthesia.  Schedule TURBT with bilateral retrograde pyelograms Will need cardiac and anesthesia clearance Agree with continuing to hold Xarelto until after surgery  I spent 45 total minutes in addition to cystoscopy on the day of the encounter including pre-visit review of the medical record, face-to-face time with the patient, and post visit ordering of labs/imaging/tests.   Walter Madrid, MD 08/05/2021

## 2021-08-05 NOTE — Progress Notes (Signed)
Surgical Physician Order Form  * Scheduling expectation : Next Available  *Length of Case: 1.5 hours  *Clearance needed: yes, cardiology(Dr. Humphrey Rolls) and hematology(Dr. Grayland Ormond)  *Anticoagulation Instructions: Hold all anticoagulants  *Aspirin Instructions: Hold Aspirin and Plavix  *Post-op visit Date/Instructions:   1-2 week follow-up discuss pathology  *Diagnosis: Bladder Tumor  *Procedure: TURBT 2-5cm (94076)  -Admit type: OUTpatient  -Anesthesia: General, consider spinal per anesthesia  -VTE Prophylaxis Standing Order SCD's       Other:   -Standing Lab Orders Per Anesthesia    Lab other:  CBC and BMP Urine culture was sent 11/16   -Standing Test orders EKG/Chest x-ray per Anesthesia       Test other:   - Medications:     Ancef 2gm IV   Other Instructions:

## 2021-08-05 NOTE — Addendum Note (Signed)
Addended by: Donalee Citrin on: 08/05/2021 01:01 PM   Modules accepted: Orders

## 2021-08-05 NOTE — Addendum Note (Signed)
Addended by: Gerald Leitz A on: 08/05/2021 04:32 PM   Modules accepted: Orders

## 2021-08-05 NOTE — Addendum Note (Signed)
Addended by: Gerald Leitz A on: 08/05/2021 04:17 PM   Modules accepted: Orders, SmartSet

## 2021-08-06 ENCOUNTER — Encounter: Payer: Self-pay | Admitting: Urology

## 2021-08-06 ENCOUNTER — Other Ambulatory Visit: Payer: Self-pay

## 2021-08-06 ENCOUNTER — Other Ambulatory Visit
Admission: RE | Admit: 2021-08-06 | Discharge: 2021-08-06 | Disposition: A | Discharge status: Home / Self Care | Payer: Medicare Other | Source: Ambulatory Visit | Attending: Urology | Admitting: Urology

## 2021-08-06 HISTORY — DX: Gastro-esophageal reflux disease without esophagitis: K21.9

## 2021-08-06 HISTORY — DX: Dyspnea, unspecified: R06.00

## 2021-08-06 HISTORY — DX: Presence of automatic (implantable) cardiac defibrillator: Z95.810

## 2021-08-06 LAB — CYTOLOGY - NON PAP

## 2021-08-06 NOTE — Patient Instructions (Signed)
Your procedure is scheduled on: 08/07/21 Report to the Registration Desk on the 1st floor of the [facility]. To find out your arrival time, please call [phone] between 1PM - 3PM on: 08/06/21  REMEMBER: Instructions that are not followed completely may result in serious medical risk, up to and including death; or upon the discretion of your surgeon and anesthesiologist your surgery may need to be rescheduled.  Do not eat food or drink any fluids after midnight the night before surgery.  No gum chewing, lozengers or hard candies.  TAKE THESE MEDICATIONS THE MORNING OF SURGERY WITH A SIP OF WATER:  - levothyroxine (SYNTHROID) 50 MCG tablet - amiodarone (PACERONE) 200 MG tablet - atorvastatin (LIPITOR) 80 MG tablet - pantoprazole (PROTONIX) 20 MG tablet, (take one the night before and one on the morning of surgery - helps to prevent nausea after surgery.) - Vericiguat 5 MG TABS  One week prior to surgery: Stop Anti-inflammatories (NSAIDS) such as Advil, Aleve, Ibuprofen, Motrin, Naproxen, Naprosyn and Aspirin based products such as Excedrin, Goodys Powder, BC Powder.  Stop ANY OVER THE COUNTER supplements until after surgery.  You may however, continue to take Tylenol if needed for pain up until the day of surgery.  No Alcohol for 24 hours before or after surgery.  No Smoking including e-cigarettes for 24 hours prior to surgery.  No chewable tobacco products for at least 6 hours prior to surgery.  No nicotine patches on the day of surgery.  Do not use any "recreational" drugs for at least a week prior to your surgery.  Please be advised that the combination of cocaine and anesthesia may have negative outcomes, up to and including death. If you test positive for cocaine, your surgery will be cancelled.  On the morning of surgery brush your teeth with toothpaste and water, you may rinse your mouth with mouthwash if you wish. Do not swallow any toothpaste or mouthwash.  Take a  fresh shower/bath the morning of surgery, you may apply deodorant.  Do not wear jewelry, make-up, hairpins, clips or nail polish.  Do not wear lotions, powders, or perfumes.   Do not shave body from the neck down 48 hours prior to surgery just in case you cut yourself which could leave a site for infection.  Also, freshly shaved skin may become irritated if using the CHG soap.  Contact lenses, hearing aids and dentures may not be worn into surgery.  Do not bring valuables to the hospital. [facility] is not responsible for any missing/lost belongings or valuables.   Notify your doctor if there is any change in your medical condition (cold, fever, infection).  Wear comfortable clothing (specific to your surgery type) to the hospital.  After surgery, you can help prevent lung complications by doing breathing exercises.  Take deep breaths and cough every 1-2 hours. Your doctor may order a device called an Incentive Spirometer to help you take deep breaths. When coughing or sneezing, hold a pillow firmly against your incision with both hands. This is called "splinting." Doing this helps protect your incision. It also decreases belly discomfort.  If you are being admitted to the hospital overnight, leave your suitcase in the car. After surgery it may be brought to your room.  If you are being discharged the day of surgery, you will not be allowed to drive home. You will need a responsible adult (18 years or older) to drive you home and stay with you that night.   If you  are taking public transportation, you will need to have a responsible adult (18 years or older) with you. Please confirm with your physician that it is acceptable to use public transportation.   Please call the [place] Dept. at [phone] if you have any questions about these instructions.  Surgery Visitation Policy:  Patients undergoing a surgery or procedure may have one family member or support  person with them as long as that person is not COVID-19 positive or experiencing its symptoms.  That person may remain in the waiting area during the procedure and may rotate out with other people.  Inpatient Visitation:    Visiting hours are 7 a.m. to 8 p.m. Up to two visitors ages 16+ are allowed at one time in a patient room. The visitors may rotate out with other people during the day. Visitors must check out when they leave, or other visitors will not be allowed. One designated support person may remain overnight. The visitor must pass COVID-19 screenings, use hand sanitizer when entering and exiting the patient's room and wear a mask at all times, including in the patient's room. Patients must also wear a mask when staff or their visitor are in the room. Masking is required regardless of vaccination status.

## 2021-08-06 NOTE — Progress Notes (Addendum)
Perioperative Services  Pre-Admission/Anesthesia Testing Clinical Review  Date: 08/06/21  Patient Demographics:  Name: Walter Blair DOB:   [birth year] MRN:   [id number]  Planned Surgical Procedure(s):    Case: [id number] Date/Time: 08/07/21 1044   Procedures:      TRANSURETHRAL RESECTION OF BLADDER TUMOR (TURBT)     CYSTOSCOPY WITH RETROGRADE PYELOGRAM (Bilateral)   Anesthesia type: General   Pre-op diagnosis: bladder tumor   Location: [facility] OR ROOM 10 / [facility]   Surgeons: Billey Co, MD   NOTE: Available PAT nursing documentation and vital signs have been reviewed. Clinical nursing staff has updated patient's PMH/PSHx, current medication list, and drug allergies/intolerances to ensure comprehensive history available to assist in medical decision making as it pertains to the aforementioned surgical procedure and anticipated anesthetic course. Extensive review of available clinical information performed. [place] PMH and PSHx updated with any diagnoses/procedures that  may have been inadvertently omitted during his intake with the pre-admission testing department's nursing staff.  Clinical Discussion:  Walter Blair is a 75 y.o. male who is submitted for pre-surgical anesthesia review and clearance prior to him undergoing the above procedure. Patient is a Former Research scientist (life sciences). Pertinent PMH includes: CAD (s/p CABG), ischemic cardiomyopathy (s/p AICD placement), HFrEF, atrial fibrillation, aortic atherosclerosis, HTN, HLD, T2DM, hypothyroidism, dyspnea, CKD-III, GERD (on daily PPI), IDA, thrombocytopenia.  Patient is followed by cardiology Humphrey Rolls, MD). He was last seen in the cardiology clinic on 08/04/2021 at the time of his clinic visit, patient reporting chronic shortness of breath and peripheral edema.  He denied any episodes of chest pain, PND, orthopnea, palpitations, vertiginous symptoms, or presyncope/syncope.  PMH significant for cardiovascular diagnoses.  Of  note, patient has had out-of-state care, and thus records were not available for review at time of consult.  Information obtained from local cardiologist's notes.  Patient underwent a four-vessel CABG while living in [place] in 2003.  Patient suffered a LEFT sided CVA in 2004. He has residual weakness in his LEFT hand only.   Patient with a history of HFrEF.  TTE performed on 06/29/2017 revealed a severely dilated left ventricle with severely reduced left ventricular function; EF was 15%.  There was diffuse hypokinesis.  There was mild aortic, severe mitral/tricuspid, and moderate pulmonary valve regurgitation.  There was mild biatrial enlargement.  Patient underwent DCCV procedure on 07/01/2017 converting him to NSR.  Patient had short-term recurrence, and underwent a second DCCV procedure on 07/05/2017.  Patient has a history of ischemic cardiomyopathy.  He has had an AICD placed, which is regularly monitored by his local cardiologist.  Repeat TTE was performed on 06/29/2021 revealing a significantly reduced left ventricular function with an EF of 35-40%.  There was severe biatrial and biventricular enlargement.  Diffuse hypokinesis noted.  There was trace pulmonary, mild aortic, and severe tricuspid/mitral valve regurgitation.  Diastolic parameters consistent with pseudonormalization (G2DD).  There was no evidence of a significant transvalvular gradient suggestive of valvular stenosis.  Patient admitted to [facility] from 07/24/2021 through 07/28/2021; notes reviewed.  Patient was sent from cardiology office due to significant peripheral edema, decreased urinary function, and worsening shortness of breath.  Patient found to severely anemic with a hemoglobin of 7.9 g/dL.  Additionally, he was thrombocytopenic with a platelet count of 92,000.  Peripheral smear by pathologist reviewed schistocytosis.  BUN 40 and creatinine 2.19 (GFR 30 mL/min).  BNP significantly elevated at 3305 pg/mL.  D-dimer  elevated at 3.38 ug/mL-FEU.  CXR performed revealing pulmonary edema with  LEFT basilar atelectasis and pleural effusion.  Patient was admitted to the hospital for further management.  Cardiology consulted and TTE was repeated revealing severe left ventricular dysfunction with an EF of 20-25%.  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  Bone marrow biopsy planned in the outpatient setting.  Patient received blood transfusion.  Given his anemia, patient's rivaroxaban was placed on hold.  He continued to improve during his admission.  Hemoglobin trended up 9.3 g/dL with transfusion.  He was discharged home on 07/28/2021 with plans for outpatient follow-up.  Patient with an atrial fibrillation diagnosis; CHA2DS2-VASc Score = 7 (age x 2, CHF, CVA x 2, aortic plaque, T2DM).  Rate and rhythm controlled on oral amiodarone.  Patient normally anticoagulated with daily rivaroxaban, however given recent anemia and concerns of DIC, rivaroxaban has been on hold since recent hospitalization.  Heart failure and blood pressure currently being managed on daily ARB/ANRi and diuretic therapies; blood pressure documented at 86/52.  Patient is on a statin for his HLD.  T2DM well-controlled on currently prescribed regimen; last Hgb A1c was 5.9% when checked on 07/06/2021.  Functional capacity limited by underlying cardiopulmonary diagnoses.  Patient unable to achieve 4 METS of activity at this time.  Given recent admission for heart failure exacerbation, vasodilator (vericiguat) therapy was added to patient's daily regimen.  No other changes were made to his medication regimen.  Patient to follow-up with outpatient cardiology in 1 month or sooner if needed.  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  Patient is scheduled for an TRANSURETHRAL RESECTION OF BLADDER TUMOR; CYSTOSCOPY WITH RETROGRADE PYELOGRAM on  07/07/2021 with Dr. Nickolas Madrid, MD. Given patient's past medical history significant for cardiovascular diagnoses, presurgical cardiac clearance was sought by the performing surgeon's office and PAT team.  Additionally, surgeons office asking for clearance from hematology/oncology.  Specialty clearances were obtained as follows.   Per hematology Grayland Ormond, MD), "this patient is optimized for surgery and may proceed with the planned procedural course with an ACCEPTABLE risk of significant perioperative complications".  Per cardiology Humphrey Rolls, MD), "this patient is optimized for surgery and may proceed with the planned procedural course with an ACCEPTABLE risk of significant perioperative cardiovascular complications".  Again, this patient is on daily rivaroxaban therapy, however due to anemia and recent DIC concerns, this medication is on hold.  He is not on any other type of anticoagulation/antiplatelet therapies that may be held in the perioperative period.    Patient denies previous perioperative complications with anesthesia in the past. In review of the available records, it is noted that patient underwent a general anesthetic course here (ASA IV) in 12/2020 without documented complications.   Vitals with BMI 08/05/2021 08/04/2021 07/28/2021  Height _0  - -  Weight 128 lbs 128 lbs -  BMI 97.98 92.11 -  Systolic 941 740 96  Diastolic 60 63 64  Pulse 68 63 59    Providers/Specialists:   NOTE: Primary physician provider listed below. Patient may have been seen by APP or partner within same practice.   PROVIDER ROLE / SPECIALTY LAST Ranae Pila, MD Urology 08/05/2021  Jodi Marble, MD Primary Care Provider ???  Neoma Laming, MD Cardiology 08/04/2021  Delight Hoh, MD Hematology/Oncology 08/04/2021  Orpah Cobb, MD Endocrinology 07/13/2021   Allergies:  Ativan [lorazepam]  Current Home Medications:   No current facility-administered medications for this  encounter.    amiodarone (  PACERONE) 200 MG tablet   atorvastatin (LIPITOR) 80 MG tablet   ferrous sulfate (FERROUSUL) 325 (65 FE) MG tablet   folic acid (FOLVITE) 1 MG tablet   glyBURIDE (DIABETA) 2.5 MG tablet   levothyroxine (SYNTHROID) 50 MCG tablet   pantoprazole (PROTONIX) 20 MG tablet   polyethylene glycol (MIRALAX / GLYCOLAX) packet   sacubitril-valsartan (ENTRESTO) 24-26 MG   spironolactone (ALDACTONE) 25 MG tablet   torsemide (DEMADEX) 20 MG tablet   Vericiguat 5 MG TABS   ONETOUCH ULTRA test strip   History:   Past Medical History:  Diagnosis Date   AICD (automatic cardioverter/defibrillator) present    Aortic atherosclerosis (HCC)    Atrial fibrillation (HCC)    a.) CHA2DS2-VASc = 7 (age x 2, CHF, CVA x 2, aortic plaque, T2DM). b.) rate/rhythm maintained on oral amiodarone; chronically anticoagulated on rivaroxaban. c.) s/p DCCV 07/01/2017 and 07/05/2017   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   a.) cystoscopy 08/05/2021 --> ~2-3 cm papillary tumor at the LEFT bladder base   CAD (coronary artery disease)    CKD (chronic kidney disease), stage III (HCC)    Dyspnea    GERD (gastroesophageal reflux disease)    HFrEF (heart failure with reduced ejection fraction) ([place])    a.) TTE 06/29/2017: EF 10-15%; diffuse HK; mild AR, severe MR/TR, mod PR; BAE. b.) TTE 07/25/2021: EF 20-25%; global HK; G3DD; severely reduced RV function; Severe BAR; mod-severe MR, severe TR, mild AR.   HLD (hyperlipidemia)    HTN (hypertension)    Hypothyroidism    IDA (iron deficiency anemia)    Ischemic cardiomyopathy    a.) TTE 06/29/2017: EF 10-15%. b.) TTE 07/25/2021: ED 20-25%.   Left-sided cerebrovascular accident (CVA) ([place]) 2004   a.) residual weakness in LEFT hand   Long term current use of anticoagulant    a.) rivaroxaban   S/P CABG x 4 2003   Performed in [place]   T2DM (type 2 diabetes mellitus) ([place])    Thrombocytopenia ([place])    possibly associated with underlying DIC   Past Surgical  History:  Procedure Laterality Date   CARDIAC DEFIBRILLATOR PLACEMENT     CARDIOVERSION N/A 07/01/2017   Procedure: CARDIOVERSION;  Surgeon: Dionisio David, MD;  Location: [facility];  Service: Cardiovascular;  Laterality: N/A;   CARDIOVERSION N/A 07/05/2017   Procedure: CARDIOVERSION;  Surgeon: Dionisio David, MD;  Location: [facility] ORS;  Service: Cardiovascular;  Laterality: N/A;   COLONOSCOPY WITH PROPOFOL N/A 12/30/2020   Procedure: COLONOSCOPY WITH PROPOFOL;  Surgeon: [name], MD;  Location: [facility] ENDOSCOPY;  Service: Gastroenterology;  Laterality: N/A;   CORONARY ARTERY BYPASS GRAFT N/A 2003   Procedure: Four-vessel CABG performed in [place]   ESOPHAGOGASTRODUODENOSCOPY  12/30/2020   Procedure: ESOPHAGOGASTRODUODENOSCOPY (EGD);  Surgeon: [name], MD;  Location: [facility] ENDOSCOPY;  Service: Gastroenterology;;   GIVENS CAPSULE STUDY N/A 02/02/2021   Procedure: GIVENS CAPSULE STUDY;  Surgeon: [name], MD;  Location: [facility] ENDOSCOPY;  Service: Gastroenterology;  Laterality: N/A;  WILL NEED INTERPRETER ON WHEELS;  BROTHER WANTS TO TRANSLATE   TEE WITHOUT CARDIOVERSION N/A 07/01/2017   Procedure: TRANSESOPHAGEAL ECHOCARDIOGRAM (TEE);  Surgeon: Dionisio David, MD;  Location: [facility] ORS;  Service: Cardiovascular;  Laterality: N/A;   TEE WITHOUT CARDIOVERSION N/A 07/05/2017   Procedure: TRANSESOPHAGEAL ECHOCARDIOGRAM (TEE);  Surgeon: Dionisio David, MD;  Location: [facility] ORS;  Service: Cardiovascular;  Laterality: N/A;   Family History  Problem Relation Age of Onset   Diabetes Brother    Hypertension Mother  Diabetes Sister    Social History   Tobacco Use   Smoking status: Former    Types: Cigarettes   Smokeless tobacco: Former    Types: Chew  Substance Use Topics   Alcohol use: No   Drug use: No    Pertinent Clinical Results:  LABS: Labs reviewed: Acceptable for surgery.  Lab Results  Component Value Date   WBC 4.8 08/04/2021   HGB 9.3 (L) 08/04/2021   HCT 28.8  (L) 08/04/2021   MCV 93.2 08/04/2021   PLT 154 08/04/2021   Lab Results  Component Value Date   NA 132 (L) 08/04/2021   K 5.1 08/04/2021   CO2 25 08/04/2021   GLUCOSE 94 08/04/2021   BUN 31 (H) 08/04/2021   CREATININE 1.56 (H) 08/04/2021   CALCIUM 9.3 08/04/2021   GFRNONAA 46 (L) 08/04/2021     ECG: Date: 07/24/2021 Time ECG obtained: 1010 AM Rate: 64 bpm Rhythm:  Ventricular paced rhythm Intervals: QRS 174 ms. QTc 539 ms. ST segment and T wave changes: No evidence of acute ST segment elevation or depression Comparison: Previous ECG from 06/27/2017 revealed atrial fibrillation with RVR   IMAGING / PROCEDURES: TRANSTHORACIC ECHOCARDIOGRAM performed on 07/25/2021 Left ventricular ejection fraction, by estimation, is 20 to 25%. The left ventricle has severely decreased function. The left ventricle demonstrates global hypokinesis. The left ventricular internal cavity size was severely dilated. There is mild left ventricular hypertrophy. Left ventricular diastolic parameters are consistent with Grade III diastolic dysfunction (restrictive).  Right ventricular systolic function is severely reduced. The right ventricular size is severely enlarged. Mildly increased right ventricular wall thickness. There is mildly elevated pulmonary artery systolic pressure.  Left atrial size was severely dilated.  Right atrial size was severely dilated.  The mitral valve is abnormal. Moderate to severe mitral valve regurgitation.  Tricuspid valve regurgitation is severe.  The aortic valve is calcified. Aortic valve regurgitation is mild. Mild aortic valve sclerosis is present, with no evidence of aortic valve stenosis.   CT ABDOMEN PELVIS WO CONTRAST  performed on 07/24/2021 Cardiomegaly Trace pleural effusions and bibasilar reticulonodular/tree in bud mild opacities worse on the left. Difficult to exclude mild bibasilar bronchopneumonia. Small amount abdominopelvic ascites. Cholelithiasis Abdominal  atherosclerosis No other acute intra-abdominal finding by noncontrast CT  DIAGNOSTIC RADIOGRAPHS OF CHEST 2 VIEWS performed on 07/24/2021 The cardiomediastinal silhouette is unchanged in contour. Status post median sternotomy and CABG.  LEFT chest AICD.  Small LEFT pleural effusion.  No pneumothorax.  Mild diffuse interstitial prominence. LEFT retrocardiac opacities. Visualized abdomen is unremarkable. Mild degenerative changes of the thoracic spine.  Impression and Plan:  Jarryn Altland has been referred for pre-anesthesia review and clearance prior to him undergoing the planned anesthetic and procedural courses. Available labs, pertinent testing, and imaging results were personally reviewed by me. This patient has been appropriately cleared by cardiology (ACCEPTABLE) and hematology (ACCEPTABLE) with the individually indicated risk of significant perioperative complications. Completed perioperative prescription for cardiac device management documentation completed by primary cardiology team and placed on patient's chart for review by the surgical/anesthetic team on the day of his procedure.   Based on clinical review performed today (08/06/21), barring any significant acute changes in the patient's overall condition, it is anticipated that he will be able to proceed with the planned surgical intervention. Any acute changes in clinical condition may necessitate his procedure being postponed and/or cancelled. Patient will meet with anesthesia team (MD and/or CRNA) on the day of his procedure for preoperative evaluation/assessment. Questions regarding  anesthetic course will be fielded at that time.   Pre-surgical instructions were reviewed with the patient during his PAT appointment and questions were fielded by PAT clinical staff. Patient was advised that if any questions or concerns arise prior to his procedure then he should return a call to PAT and/or his surgeon's office to discuss.  Honor Loh, MSN, APRN, FNP-C, CEN [facility]  Peri-operative Services Nurse Practitioner Phone: [phone] Fax: [phone] 08/06/21 2:52 PM  NOTE: This note has been prepared using Dragon dictation software. Despite my best ability to proofread, there is always the potential that unintentional transcriptional errors may still occur from this process.

## 2021-08-07 ENCOUNTER — Ambulatory Visit: Payer: Medicare Other | Admitting: Urgent Care

## 2021-08-07 ENCOUNTER — Encounter: Payer: Self-pay | Admitting: Urology

## 2021-08-07 ENCOUNTER — Encounter
Admission: RE | Disposition: A | Discharge status: Home / Self Care | Payer: Self-pay | Source: Home / Self Care | Attending: Urology

## 2021-08-07 ENCOUNTER — Ambulatory Visit: Payer: Medicare Other

## 2021-08-07 ENCOUNTER — Ambulatory Visit
Admission: RE | Admit: 2021-08-07 | Discharge: 2021-08-07 | Disposition: A | Discharge status: Home / Self Care | Payer: Medicare Other | Attending: Urology | Admitting: Urology

## 2021-08-07 DIAGNOSIS — I130 Hypertensive heart and chronic kidney disease with heart failure and stage 1 through stage 4 chronic kidney disease, or unspecified chronic kidney disease: Secondary | ICD-10-CM | POA: Diagnosis not present

## 2021-08-07 DIAGNOSIS — I083 Combined rheumatic disorders of mitral, aortic and tricuspid valves: Secondary | ICD-10-CM | POA: Diagnosis not present

## 2021-08-07 DIAGNOSIS — R310 Gross hematuria: Secondary | ICD-10-CM | POA: Diagnosis not present

## 2021-08-07 DIAGNOSIS — I69354 Hemiplegia and hemiparesis following cerebral infarction affecting left non-dominant side: Secondary | ICD-10-CM | POA: Insufficient documentation

## 2021-08-07 DIAGNOSIS — I255 Ischemic cardiomyopathy: Secondary | ICD-10-CM | POA: Diagnosis not present

## 2021-08-07 DIAGNOSIS — I2510 Atherosclerotic heart disease of native coronary artery without angina pectoris: Secondary | ICD-10-CM | POA: Diagnosis not present

## 2021-08-07 DIAGNOSIS — E039 Hypothyroidism, unspecified: Secondary | ICD-10-CM | POA: Diagnosis not present

## 2021-08-07 DIAGNOSIS — C679 Malignant neoplasm of bladder, unspecified: Secondary | ICD-10-CM | POA: Diagnosis not present

## 2021-08-07 DIAGNOSIS — Z87891 Personal history of nicotine dependence: Secondary | ICD-10-CM | POA: Insufficient documentation

## 2021-08-07 DIAGNOSIS — N183 Chronic kidney disease, stage 3 unspecified: Secondary | ICD-10-CM | POA: Diagnosis not present

## 2021-08-07 DIAGNOSIS — C670 Malignant neoplasm of trigone of bladder: Secondary | ICD-10-CM | POA: Diagnosis not present

## 2021-08-07 DIAGNOSIS — I509 Heart failure, unspecified: Secondary | ICD-10-CM | POA: Insufficient documentation

## 2021-08-07 DIAGNOSIS — Z951 Presence of aortocoronary bypass graft: Secondary | ICD-10-CM | POA: Diagnosis not present

## 2021-08-07 DIAGNOSIS — Z95810 Presence of automatic (implantable) cardiac defibrillator: Secondary | ICD-10-CM | POA: Diagnosis not present

## 2021-08-07 DIAGNOSIS — D494 Neoplasm of unspecified behavior of bladder: Secondary | ICD-10-CM

## 2021-08-07 DIAGNOSIS — K219 Gastro-esophageal reflux disease without esophagitis: Secondary | ICD-10-CM | POA: Diagnosis not present

## 2021-08-07 DIAGNOSIS — E1122 Type 2 diabetes mellitus with diabetic chronic kidney disease: Secondary | ICD-10-CM | POA: Insufficient documentation

## 2021-08-07 DIAGNOSIS — I252 Old myocardial infarction: Secondary | ICD-10-CM | POA: Diagnosis not present

## 2021-08-07 HISTORY — PX: BLADDER INSTILLATION: SHX6893

## 2021-08-07 HISTORY — DX: Iron deficiency anemia, unspecified: D50.9

## 2021-08-07 HISTORY — DX: Type 2 diabetes mellitus without complications: E11.9

## 2021-08-07 HISTORY — DX: Long term (current) use of anticoagulants: Z79.01

## 2021-08-07 HISTORY — DX: Hypothyroidism, unspecified: E03.9

## 2021-08-07 HISTORY — DX: Thrombocytopenia, unspecified: D69.6

## 2021-08-07 HISTORY — DX: Unspecified systolic (congestive) heart failure: I50.20

## 2021-08-07 HISTORY — DX: Unspecified atrial fibrillation: I48.91

## 2021-08-07 HISTORY — DX: Hyperlipidemia, unspecified: E78.5

## 2021-08-07 HISTORY — DX: Ischemic cardiomyopathy: I25.5

## 2021-08-07 HISTORY — DX: Atherosclerosis of aorta: I70.0

## 2021-08-07 HISTORY — DX: Chronic kidney disease, stage 3 unspecified: N18.30

## 2021-08-07 HISTORY — PX: TRANSURETHRAL RESECTION OF BLADDER TUMOR: SHX2575

## 2021-08-07 HISTORY — PX: CYSTOSCOPY W/ RETROGRADES: SHX1426

## 2021-08-07 HISTORY — PX: URETEROSCOPY: SHX842

## 2021-08-07 LAB — POCT I-STAT, CHEM 8
BUN: 25 mg/dL — ABNORMAL HIGH (ref 8–23)
Calcium, Ion: 1.14 mmol/L — ABNORMAL LOW (ref 1.15–1.40)
Chloride: 99 mmol/L (ref 98–111)
Creatinine, Ser: 1.7 mg/dL — ABNORMAL HIGH (ref 0.61–1.24)
Glucose, Bld: 86 mg/dL (ref 70–99)
HCT: 30 % — ABNORMAL LOW (ref 39.0–52.0)
Hemoglobin: 10.2 g/dL — ABNORMAL LOW (ref 13.0–17.0)
Potassium: 3.8 mmol/L (ref 3.5–5.1)
Sodium: 134 mmol/L — ABNORMAL LOW (ref 135–145)
TCO2: 24 mmol/L (ref 22–32)

## 2021-08-07 LAB — GLUCOSE, CAPILLARY
Glucose-Capillary: 70 mg/dL (ref 70–99)
Glucose-Capillary: 83 mg/dL (ref 70–99)

## 2021-08-07 SURGERY — TURBT (TRANSURETHRAL RESECTION OF BLADDER TUMOR)
Anesthesia: General

## 2021-08-07 MED ORDER — OXYCODONE HCL 5 MG PO TABS: 5.0000 mg | ORAL_TABLET | Freq: Once | ORAL | Status: DC | PRN

## 2021-08-07 MED ORDER — HYDROCODONE-ACETAMINOPHEN 5-325 MG PO TABS: 1.0000 | ORAL_TABLET | ORAL | Status: DC | PRN

## 2021-08-07 MED ORDER — SODIUM CHLORIDE 0.9 % IR SOLN
Status: DC | PRN
  Administered 2021-08-07: 6000 mL

## 2021-08-07 MED ORDER — ONDANSETRON HCL 4 MG/2ML IJ SOLN
INTRAMUSCULAR | Status: AC
  Filled 2021-08-07: qty 2

## 2021-08-07 MED ORDER — ORAL CARE MOUTH RINSE: 15.0000 mL | Freq: Once | OROMUCOSAL | Status: AC

## 2021-08-07 MED ORDER — PHENYLEPHRINE HCL (PRESSORS) 10 MG/ML IV SOLN
INTRAVENOUS | Status: DC | PRN
  Administered 2021-08-07: 160 ug via INTRAVENOUS
  Administered 2021-08-07: 80 ug via INTRAVENOUS

## 2021-08-07 MED ORDER — ROCURONIUM BROMIDE 100 MG/10ML IV SOLN
INTRAVENOUS | Status: DC | PRN
  Administered 2021-08-07: 40 mg via INTRAVENOUS
  Administered 2021-08-07: 10 mg via INTRAVENOUS

## 2021-08-07 MED ORDER — OXYCODONE HCL 5 MG/5ML PO SOLN: 5.0000 mg | Freq: Once | ORAL | Status: DC | PRN

## 2021-08-07 MED ORDER — IOHEXOL 180 MG/ML  SOLN
INTRAMUSCULAR | Status: DC | PRN
  Administered 2021-08-07: 20 mL

## 2021-08-07 MED ORDER — CHLORHEXIDINE GLUCONATE 0.12 % MT SOLN
OROMUCOSAL | Status: AC
  Filled 2021-08-07: qty 15

## 2021-08-07 MED ORDER — FENTANYL CITRATE (PF) 100 MCG/2ML IJ SOLN
INTRAMUSCULAR | Status: DC | PRN
  Administered 2021-08-07 (×2): 50 ug via INTRAVENOUS

## 2021-08-07 MED ORDER — ONDANSETRON HCL 4 MG/2ML IJ SOLN: 4.0000 mg | Freq: Once | INTRAMUSCULAR | Status: DC | PRN

## 2021-08-07 MED ORDER — DEXAMETHASONE SODIUM PHOSPHATE 10 MG/ML IJ SOLN
INTRAMUSCULAR | Status: AC
  Filled 2021-08-07: qty 1

## 2021-08-07 MED ORDER — LACTATED RINGERS IV SOLN: INTRAVENOUS | Status: DC

## 2021-08-07 MED ORDER — HYDROCODONE-ACETAMINOPHEN 5-325 MG PO TABS
ORAL_TABLET | ORAL | Status: AC
  Administered 2021-08-07: 1 via ORAL
  Filled 2021-08-07: qty 1

## 2021-08-07 MED ORDER — PROPOFOL 10 MG/ML IV BOLUS
INTRAVENOUS | Status: DC | PRN
  Administered 2021-08-07: 110 mg via INTRAVENOUS

## 2021-08-07 MED ORDER — HYDROCODONE-ACETAMINOPHEN 5-325 MG PO TABS: 1.0000 | ORAL_TABLET | ORAL | 0 refills | Status: AC | PRN

## 2021-08-07 MED ORDER — ONDANSETRON HCL 4 MG/2ML IJ SOLN
INTRAMUSCULAR | Status: DC | PRN
  Administered 2021-08-07: 4 mg via INTRAVENOUS

## 2021-08-07 MED ORDER — CEFAZOLIN SODIUM-DEXTROSE 2-4 GM/100ML-% IV SOLN
2.0000 g | INTRAVENOUS | Status: AC
  Administered 2021-08-07: 2 g via INTRAVENOUS

## 2021-08-07 MED ORDER — PROPOFOL 500 MG/50ML IV EMUL
INTRAVENOUS | Status: AC
  Filled 2021-08-07: qty 50

## 2021-08-07 MED ORDER — FENTANYL CITRATE (PF) 100 MCG/2ML IJ SOLN: 25.0000 ug | INTRAMUSCULAR | Status: DC | PRN

## 2021-08-07 MED ORDER — SODIUM CHLORIDE 0.9 % IV SOLN: INTRAVENOUS | Status: DC

## 2021-08-07 MED ORDER — CHLORHEXIDINE GLUCONATE 0.12 % MT SOLN
15.0000 mL | Freq: Once | OROMUCOSAL | Status: AC
  Administered 2021-08-07: 15 mL via OROMUCOSAL

## 2021-08-07 MED ORDER — GEMCITABINE CHEMO FOR BLADDER INSTILLATION 2000 MG
2000.0000 mg | Freq: Once | INTRAVENOUS | Status: AC
  Administered 2021-08-07: 2000 mg via INTRAVESICAL
  Filled 2021-08-07: qty 52.6

## 2021-08-07 MED ORDER — ROCURONIUM BROMIDE 10 MG/ML (PF) SYRINGE
PREFILLED_SYRINGE | INTRAVENOUS | Status: AC
  Filled 2021-08-07: qty 10

## 2021-08-07 MED ORDER — LIDOCAINE HCL (CARDIAC) PF 100 MG/5ML IV SOSY
PREFILLED_SYRINGE | INTRAVENOUS | Status: DC | PRN
  Administered 2021-08-07: 80 mg via INTRAVENOUS

## 2021-08-07 MED ORDER — FENTANYL CITRATE (PF) 100 MCG/2ML IJ SOLN
INTRAMUSCULAR | Status: AC
  Filled 2021-08-07: qty 2

## 2021-08-07 MED ORDER — SUGAMMADEX SODIUM 200 MG/2ML IV SOLN
INTRAVENOUS | Status: DC | PRN
Start: 2021-08-07 — End: 2021-08-07
  Administered 2021-08-07: 200 mg via INTRAVENOUS

## 2021-08-07 MED ORDER — DEXAMETHASONE SODIUM PHOSPHATE 10 MG/ML IJ SOLN
INTRAMUSCULAR | Status: DC | PRN
  Administered 2021-08-07: 6 mg via INTRAVENOUS

## 2021-08-07 MED ORDER — LIDOCAINE HCL (PF) 2 % IJ SOLN
INTRAMUSCULAR | Status: AC
  Filled 2021-08-07: qty 5

## 2021-08-07 MED ORDER — ACETAMINOPHEN 10 MG/ML IV SOLN: 1000.0000 mg | Freq: Once | INTRAVENOUS | Status: DC | PRN

## 2021-08-07 SURGICAL SUPPLY — 37 items
BAG DRAIN CYSTO-URO LG1000N (MISCELLANEOUS) ×4 IMPLANT
BAG URINE DRAIN 2000ML AR STRL (UROLOGICAL SUPPLIES) ×8 IMPLANT
BRUSH SCRUB EZ  4% CHG (MISCELLANEOUS)
BRUSH SCRUB EZ 1% IODOPHOR (MISCELLANEOUS) IMPLANT
BRUSH SCRUB EZ 4% CHG (MISCELLANEOUS) IMPLANT
CATH FOL 2WAY LX 18X30 (CATHETERS) ×4 IMPLANT
CATH FOL 2WAY LX 22X30 (CATHETERS) ×4 IMPLANT
CATH URETL OPEN 5X70 (CATHETERS) ×4 IMPLANT
CONRAY 43 FOR UROLOGY 50M (MISCELLANEOUS) IMPLANT
DRAPE UTILITY 15X26 TOWEL STRL (DRAPES) ×4 IMPLANT
DRSG TELFA 4X3 1S NADH ST (GAUZE/BANDAGES/DRESSINGS) ×4 IMPLANT
ELECT LOOP 22F BIPOLAR SML (ELECTROSURGICAL)
ELECT REM PT RETURN 9FT ADLT (ELECTROSURGICAL)
ELECTRODE LOOP 22F BIPOLAR SML (ELECTROSURGICAL) IMPLANT
ELECTRODE REM PT RTRN 9FT ADLT (ELECTROSURGICAL) IMPLANT
GAUZE 4X4 16PLY ~~LOC~~+RFID DBL (SPONGE) ×4 IMPLANT
GLOVE SURG UNDER POLY LF SZ7.5 (GLOVE) ×4 IMPLANT
GOWN STRL REUS W/ TWL LRG LVL3 (GOWN DISPOSABLE) ×3 IMPLANT
GOWN STRL REUS W/ TWL XL LVL3 (GOWN DISPOSABLE) ×3 IMPLANT
GOWN STRL REUS W/TWL LRG LVL3 (GOWN DISPOSABLE) ×1
GOWN STRL REUS W/TWL XL LVL3 (GOWN DISPOSABLE) ×1
GUIDEWIRE STR DUAL SENSOR (WIRE) ×4 IMPLANT
IV NS IRRIG 3000ML ARTHROMATIC (IV SOLUTION) ×8 IMPLANT
KIT TURNOVER CYSTO (KITS) ×4 IMPLANT
LOOP CUT BIPOLAR 24F LRG (ELECTROSURGICAL) ×4 IMPLANT
MANIFOLD NEPTUNE II (INSTRUMENTS) IMPLANT
NDL SAFETY ECLIPSE 18X1.5 (NEEDLE) IMPLANT
NEEDLE HYPO 18GX1.5 SHARP (NEEDLE)
PACK CYSTO AR (MISCELLANEOUS) ×4 IMPLANT
PAD ARMBOARD 7.5X6 YLW CONV (MISCELLANEOUS) ×4 IMPLANT
SET CYSTO W/LG BORE CLAMP LF (SET/KITS/TRAYS/PACK) ×4 IMPLANT
SET IRRIG Y TYPE TUR BLADDER L (SET/KITS/TRAYS/PACK) ×4 IMPLANT
SURGILUBE 2OZ TUBE FLIPTOP (MISCELLANEOUS) ×4 IMPLANT
SYR TOOMEY IRRIG 70ML (MISCELLANEOUS) ×4
SYRINGE TOOMEY IRRIG 70ML (MISCELLANEOUS) ×3 IMPLANT
WATER STERILE IRR 1000ML POUR (IV SOLUTION) IMPLANT
WATER STERILE IRR 500ML POUR (IV SOLUTION) ×4 IMPLANT

## 2021-08-07 NOTE — Anesthesia Preprocedure Evaluation (Addendum)
Anesthesia Evaluation  Patient identified by MRN, date of birth, ID band Patient awake    Reviewed: Allergy & Precautions, NPO status , Patient's Chart, lab work & pertinent test results  History of Anesthesia Complications Negative for: history of anesthetic complications  Airway Mallampati: II   Neck ROM: Full    Dental  (+) Upper Dentures Missing lower molars:   Pulmonary former smoker (quit 2001; continues to use chewing tobacco),    Pulmonary exam normal breath sounds clear to auscultation       Cardiovascular hypertension, + CAD (s/p MI, CABG, stents) and +CHF (2/2 ischemic cardiomyopathy, EF 20-25%)  Normal cardiovascular exam+ dysrhythmias (a fib s/p ablations x2) + pacemaker + Cardiac Defibrillator  Rhythm:Regular Rate:Normal  TRANSTHORACIC ECHOCARDIOGRAM 07/25/21: 1. Left ventricular ejection fraction, by estimation, is 20 to 25%. The left ventricle has severely decreased function. The left ventricle demonstrates global hypokinesis. The left ventricular internal cavity size was severely dilated. There is mild left ventricular hypertrophy. Left ventricular diastolic parameters are consistent with Grade III diastolic dysfunction (restrictive).  2. Right ventricular systolic function is severely reduced. The right ventricular size is severely enlarged. Mildly increased right ventricular wall thickness. There is mildly elevated pulmonary artery systolic pressure.  3. Left atrial size was severely dilated.  4. Right atrial size was severely dilated.  5. The mitral valve is abnormal. Moderate to severe mitral valve regurgitation.  6. Tricuspid valve regurgitation is severe.  7. The aortic valve is calcified. Aortic valve regurgitation is mild. Mild aortic valve sclerosis is present, with no evidence of aortic valve stenosis.   ECG 07/24/21: Rate: 64 bpm Rhythm:  Ventricular paced rhythm Intervals: QRS 174 ms. QTc 539 ms. ST  segment and T wave changes: No evidence of acute ST segment elevation or depression Comparison: Previous ECG from 06/27/2017 revealed atrial fibrillation with RVR   Neuro/Psych CVA (2004, residual left hand weakness)    GI/Hepatic GERD  ,  Endo/Other  diabetes, Type 2Hypothyroidism   Renal/GU Renal disease (stage III CKD)     Musculoskeletal   Abdominal   Peds  Hematology  (+) Blood dyscrasia (thrombocytopenia, DIC during recent hospitalization 07/2021), anemia ,   Anesthesia Other Findings Reviewed and agree with Bayard Males pre-anesthesia clinical review note.  Per hematology Grayland Ormond, MD), "this patient is optimized for surgery and may proceed with the planned procedural course with an ACCEPTABLE risk of significant perioperative complications".  Per cardiology Humphrey Rolls, MD), "this patient is optimized for surgery and may proceed with the planned procedural course with an ACCEPTABLE risk of significant perioperative cardiovascular complications".  Again, this patient is on daily rivaroxaban therapy, however due to anemia and recent DIC concerns, this medication is on hold.  He is not on any other type of anticoagulation/antiplatelet therapies that may be held in the perioperative period.   Reproductive/Obstetrics                            Anesthesia Physical Anesthesia Plan  ASA: 4  Anesthesia Plan: General   Post-op Pain Management:    Induction: Intravenous  PONV Risk Score and Plan: 2 and Ondansetron, Dexamethasone and Treatment may vary due to age or medical condition  Airway Management Planned: Oral ETT  Additional Equipment:   Intra-op Plan:   Post-operative Plan: Extubation in OR  Informed Consent: I have reviewed the patients History and Physical, chart, labs and discussed the procedure including the risks, benefits and alternatives for the proposed anesthesia  with the patient or authorized representative who has indicated his/her  understanding and acceptance.     Dental advisory given (with patient and son, at bedside)  Plan Discussed with: CRNA  Anesthesia Plan Comments: (Patient and son consented for risks of anesthesia including but not limited to:  - adverse reactions to medications - damage to eyes, teeth, lips or other oral mucosa - nerve damage due to positioning  - sore throat or hoarseness - damage to heart, brain, nerves, lungs, other parts of body or loss of life  Informed patient and son about role of CRNA in peri- and intra-operative care, both of whom voiced understanding.)       Anesthesia Quick Evaluation

## 2021-08-07 NOTE — Transfer of Care (Signed)
Immediate Anesthesia Transfer of Care Note  Patient: Walter Blair  Procedure(s) Performed: TRANSURETHRAL RESECTION OF BLADDER TUMOR (TURBT) CYSTOSCOPY WITH RETROGRADE PYELOGRAM (Bilateral) DIAGNOSTIC URETEROSCOPY (Left) BLADDER INSTILLATION OF GEMCITABINE  Patient Location: PACU  Anesthesia Type:General  Level of Consciousness: awake, alert  and oriented  Airway & Oxygen Therapy: Patient Spontanous Breathing and Patient connected to face mask oxygen  Post-op Assessment: Report given to RN and Post -op Vital signs reviewed and stable  Post vital signs: Reviewed and stable  Last Vitals:  Vitals Value Taken Time  BP 103/65 08/07/21 1118  Temp    Pulse 65 08/07/21 1124  Resp 14 08/07/21 1124  SpO2    Vitals shown include unvalidated device data.  Last Pain:  Vitals:   08/07/21 0835  TempSrc: Temporal         Complications: No notable events documented.

## 2021-08-07 NOTE — H&P (Signed)
08/07/21 9:54 AM   Walter Blair [birth year] [id number]  CC: Gross hematuria, bladder tumor  HPI: Comorbid 75 year old male with recent hospitalization for CHF(EF 15%), DIC/thrombocytopenia(following with oncology outpatient now), AKI on CKD(most recent creatinine 1.56/EGFR 46), and ongoing gross hematuria over the last few months.   CT shows subtle bladder wall thickening, but on cystoscopy there is a 2 to 3 cm tumor at the left bladder base that is the likely source of his gross hematuria and anemia.  I spoke with both his brother, and another family member that is a Lexicographer.      PMH: Past Medical History:  Diagnosis Date   AICD (automatic cardioverter/defibrillator) present    Aortic atherosclerosis ([place])    Atrial fibrillation ([place])    a.) CHA2DS2-VASc = 7 (age x 2, CHF, CVA x 2, aortic plaque, T2DM). b.) rate/rhythm maintained on oral amiodarone; chronically anticoagulated on rivaroxaban. c.) s/p DCCV 07/01/2017 and 07/05/2017   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   a.) cystoscopy 08/05/2021 --> ~2-3 cm papillary tumor at the LEFT bladder base   CAD (coronary artery disease)    CKD (chronic kidney disease), stage III (HCC)    Dyspnea    GERD (gastroesophageal reflux disease)    HFrEF (heart failure with reduced ejection fraction) ([place])    a.) TTE 06/29/2017: EF 10-15%; diffuse HK; mild AR, severe MR/TR, mod PR; BAE. b.) TTE 07/25/2021: EF 20-25%; global HK; G3DD; severely reduced RV function; Severe BAR; mod-severe MR, severe TR, mild AR.   HLD (hyperlipidemia)    HTN (hypertension)    Hypothyroidism    IDA (iron deficiency anemia)    Ischemic cardiomyopathy    a.) TTE 06/29/2017: EF 10-15%. b.) TTE 07/25/2021: ED 20-25%.   Left-sided cerebrovascular accident (CVA) ([place]) 2004   a.) residual weakness in LEFT hand   Long term current use of anticoagulant    a.) rivaroxaban   S/P CABG x 4 2003   Performed in [place]   T2DM (type 2 diabetes mellitus) ([place])     Thrombocytopenia ([place])    possibly associated with underlying DIC    Surgical History: Past Surgical History:  Procedure Laterality Date   CARDIAC DEFIBRILLATOR PLACEMENT     CARDIOVERSION N/A 07/01/2017   Procedure: CARDIOVERSION;  Surgeon: Dionisio David, MD;  Location: [facility];  Service: Cardiovascular;  Laterality: N/A;   CARDIOVERSION N/A 07/05/2017   Procedure: CARDIOVERSION;  Surgeon: Dionisio David, MD;  Location: [facility];  Service: Cardiovascular;  Laterality: N/A;   COLONOSCOPY WITH PROPOFOL N/A 12/30/2020   Procedure: COLONOSCOPY WITH PROPOFOL;  Surgeon: [name], MD;  Location: [facility];  Service: Gastroenterology;  Laterality: N/A;   CORONARY ARTERY BYPASS GRAFT N/A 2003   Procedure: Four-vessel CABG performed in [place]   ESOPHAGOGASTRODUODENOSCOPY  12/30/2020   Procedure: ESOPHAGOGASTRODUODENOSCOPY (EGD);  Surgeon: [name], MD;  Location: [facility] ENDOSCOPY;  Service: Gastroenterology;;   GIVENS CAPSULE STUDY N/A 02/02/2021   Procedure: GIVENS CAPSULE STUDY;  Surgeon: [name], MD;  Location: [facility] ENDOSCOPY;  Service: Gastroenterology;  Laterality: N/A;  WILL NEED INTERPRETER ON WHEELS;  BROTHER WANTS TO TRANSLATE   TEE WITHOUT CARDIOVERSION N/A 07/01/2017   Procedure: TRANSESOPHAGEAL ECHOCARDIOGRAM (TEE);  Surgeon: Dionisio David, MD;  Location: [facility];  Service: Cardiovascular;  Laterality: N/A;   TEE WITHOUT CARDIOVERSION N/A 07/05/2017   Procedure: TRANSESOPHAGEAL ECHOCARDIOGRAM (TEE);  Surgeon: Dionisio David, MD;  Location: [facility];  Service: Cardiovascular;  Laterality: N/A;      Family History: Family History  Problem Relation Age of Onset   Diabetes Brother    Hypertension Mother    Diabetes Sister     Social History:  reports that he has quit smoking. His smoking use included cigarettes. He has quit using smokeless tobacco.  His smokeless tobacco use included chew. He reports that he does not drink alcohol and does not use  drugs.  Physical Exam: BP 112/63   Pulse 63   Temp (!) 97.1 F (36.2 C) (Temporal)   Resp 18   Ht _0  (1.676 m)   Wt 58.1 kg   SpO2 100%   BMI 20.66 kg/m    Constitutional:  Alert and oriented, No acute distress. Cardiovascular: Irregularly irregular Respiratory: Clear to auscultation bilaterally GI: Abdomen is soft, nontender, nondistended, no abdominal masses   Laboratory Data: Reviewed  Assessment & Plan:   Comorbid 75 year old male with intermittent gross hematuria found to have a 2 cm bladder tumor on cystoscopy.  Risks and benefits discussed extensively.  Cardiology and hematology clearance obtained.  We discussed transurethral resection of bladder tumor (TURBT) and risks and benefits at length. This is typically a 1 to 2-hour procedure done under general anesthesia in the operating room.  A scope is inserted through the urethra and used to resect abnormal tissue within the bladder, which is then sent to the pathologist to determine grade and stage of the tumor.  Risks include bleeding, infection, need for temporary Foley placement, and bladder perforation.  Treatment strategies are based on the type of tumor and depth of invasion.  We briefly reviewed the different treatment pathways for non-muscle invasive and muscle invasive bladder cancer.  TURBT with possible gemcitabine, bilateral retrograde pyelograms today  Nickolas Madrid, MD 08/07/2021  [facility] [address], [place], [place] [address] [phone]

## 2021-08-07 NOTE — Discharge Instructions (Signed)
AMBULATORY SURGERY  ?DISCHARGE INSTRUCTIONS ? ? ?The drugs that you were given will stay in your system until tomorrow so for the next 24 hours you should not: ? ?Drive an automobile ?Make any legal decisions ?Drink any alcoholic beverage ? ? ?You may resume regular meals tomorrow.  Today it is better to start with liquids and gradually work up to solid foods. ? ?You may eat anything you prefer, but it is better to start with liquids, then soup and crackers, and gradually work up to solid foods. ? ? ?Please notify your doctor immediately if you have any unusual bleeding, trouble breathing, redness and pain at the surgery site, drainage, fever, or pain not relieved by medication. ? ? ? ?Additional Instructions: ? ? ? ?Please contact your physician with any problems or Same Day Surgery at [phone], Monday through Friday 6 am to 4 pm, or [place] [facility] [place] Main number at [phone].  ?

## 2021-08-07 NOTE — Anesthesia Postprocedure Evaluation (Signed)
Anesthesia Post Note  Patient: Walter Blair  Procedure(s) Performed: TRANSURETHRAL RESECTION OF BLADDER TUMOR (TURBT) CYSTOSCOPY WITH RETROGRADE PYELOGRAM (Bilateral) DIAGNOSTIC URETEROSCOPY (Left) BLADDER INSTILLATION OF GEMCITABINE  Patient location during evaluation: PACU Anesthesia Type: General Level of consciousness: awake and alert, awake and oriented Pain management: pain level controlled Vital Signs Assessment: post-procedure vital signs reviewed and stable Respiratory status: spontaneous breathing, nonlabored ventilation and respiratory function stable Cardiovascular status: blood pressure returned to baseline and stable Postop Assessment: no apparent nausea or vomiting Anesthetic complications: no   No notable events documented.   Last Vitals:  Vitals:   08/07/21 1245 08/07/21 1317  BP: (!) 117/51 120/65  Pulse: 62 64  Resp: 13 14  Temp:  (!) 36.1 C  SpO2:  100%    Last Pain:  Vitals:   08/07/21 1317  TempSrc: Temporal  PainSc: 0-No pain                 Phill Mutter

## 2021-08-07 NOTE — Anesthesia Procedure Notes (Signed)
Procedure Name: Intubation Date/Time: 08/07/2021 10:19 AM Performed by: Aline Brochure, CRNA Pre-anesthesia Checklist: Patient identified, Patient being monitored, Timeout performed, Emergency Drugs available and Suction available Patient Re-evaluated:Patient Re-evaluated prior to induction Oxygen Delivery Method: Circle system utilized Preoxygenation: Pre-oxygenation with 100% oxygen Induction Type: IV induction Ventilation: Mask ventilation without difficulty Laryngoscope Size: Mac and 3 Grade View: Grade I Tube type: Oral Tube size: 7.5 mm Number of attempts: 1 Airway Equipment and Method: Stylet and Video-laryngoscopy Placement Confirmation: ETT inserted through vocal cords under direct vision, positive ETCO2 and breath sounds checked- equal and bilateral Secured at: 21 cm Tube secured with: Tape Dental Injury: Teeth and Oropharynx as per pre-operative assessment

## 2021-08-07 NOTE — Op Note (Signed)
Date of procedure: 08/07/21  Preoperative diagnosis:  Bladder tumor Gross hematuria  Postoperative diagnosis:  Same  Procedure: Cystoscopy, bilateral retrograde pyelograms with intraoperative interpretation Left diagnostic ureteroscopy TURBT medium (3.5cm) Instillation of intravesical gemcitabine  Surgeon: Nickolas Madrid, MD  Anesthesia: General  Complications: None  Intraoperative findings:  Small prostate, 3.5 cm papillary tumor overlying the left ureteral orifice, no other bladder tumors Normal right retrograde pyelogram Tumor resected entirely, left diagnostic ureteroscopy with no ureteral lesions and left retrograde pyelogram with no obvious filling defects.  Excellent efflux from left ureteral orifice after resection Excellent hemostasis  EBL: Minimal  Specimens: Bladder tumor  Drains: 22 French two-way Foley  Indication: Walter Blair is a 75 y.o. patient with gross hematuria found to have a 3.5 cm bladder tumor on clinic cystoscopy.  After reviewing the management options for treatment, they elected to proceed with the above surgical procedure(s). We have discussed the potential benefits and risks of the procedure, side effects of the proposed treatment, the likelihood of the patient achieving the goals of the procedure, and any potential problems that might occur during the procedure or recuperation. Informed consent has been obtained.  Description of procedure:  The patient was taken to the operating room and general anesthesia was induced. SCDs were placed for DVT prophylaxis. The patient was placed in the dorsal lithotomy position, prepped and draped in the usual sterile fashion, and preoperative antibiotics(Ancef) were administered. A preoperative time-out was performed.   The urethra was gently dilated with urethral sounds to 28 [place].  The 21 French cystoscope was used to intubate the meatus and a normal-appearing urethra was followed proximally into the  bladder.  The prostate was small.  Thorough cystoscopy showed that the right ureteral orifice was orthotopic, however there was a 3.5 cm papillary tumor overlying the anticipated location of the left ureteral orifice.  I cannot visualize the left ureteral orifice.  There were no other lesions in the bladder.  I started by performing a retrograde pyelogram on the right side with an access catheter, and this showed no hydronephrosis or filling defects.  The visual obturator was used to insert the 26 [place] resectoscope, and the large resecting loop was used to methodically resect the entire papillary appearing tumor over the left trigone.  At this point I could clearly identify the left ureteral orifice.  Meticulous hemostasis was achieved.  The bladder was quite thin.  The rigid cystoscope was reinserted and a sensor wire passed into the left ureteral orifice up to the kidney.  A semirigid ureteroscope advanced easily alongside the wire and there were no abnormalities or papillary tumor within the left ureter.  A retrograde pyelogram from the mid ureter showed no definite filling defects or other abnormalities.  Careful pullback ureteroscopy showed no tumor.  On cystoscopy there was brisk efflux of light pink urine from the left ureteral orifice, and I opted not to place a stent.  With the bladder decompressed there was excellent hemostasis.  A 22 French two-way Foley passed easily into the bladder and 15 mL were placed in the balloon.  500 mL of sterile water was used to irrigate the bladder and remained clear.  2 g / 40 mL gemcitabine were then instilled into the Foley and clamped.  Disposition: Stable to PACU  Plan: Unclamp Foley at 12:10 PM and allow gemcitabine to drain Follow-up Tuesday next week for Foley removal and to discuss pathology Likely can resume Xarelto mid to end of next week  Nickolas Madrid, MD

## 2021-08-08 ENCOUNTER — Encounter: Payer: Self-pay | Admitting: Urology

## 2021-08-08 LAB — CULTURE, URINE COMPREHENSIVE

## 2021-08-09 NOTE — Progress Notes (Deleted)
   Patient ID: Walter Blair, male    DOB: May 11, 1946, 75 y.o.   MRN: [id number]  HPI  Walter Blair is a 75 y/o male with a history of  Echo report from 07/25/21 reviewed and showed an EF of 20-25% along with mild LVH, mildly elevated PA pressure, severe LAE, moderate/severe Walter and severe TR.   Admitted 07/24/21 due to worsening shortness of breath with pedal edema. Placed on lasix drip and then transitioned to oral diuretics. Cardiology, nephrology & oncology consults obtained. Xarelto held due to thrombocytopenia. Transfused 1 unit of PRBC's due to anemia. To follow with urology due to hematuria.  PT/OT evaluations done. Discharged after 4 days.   He presents today for his initial visit with a chief complaint of   Review of Systems    Physical Exam  Assessment & Plan:  1: Chronic heart failure with reduced ejection fraction- - NYHA class  - BNP 07/24/21 was 3305.3  2: HTN with CKD- - BP - saw PCP (Tejan-Sie) - saw nephrology Holley Raring) 08/06/21 - BMP 08/07/21 reviewed and showed sodium 134, potassium 3.8, creatinine 1.7 and GFR 46  3: DM- - saw endocrinology ([facility]) 07/13/21 - A1c 07/06/21 was 5.9%  4: Atrial fibrillation- - saw cardiology Humphrey Rolls)

## 2021-08-10 ENCOUNTER — Encounter: Payer: Self-pay | Admitting: Emergency Medicine

## 2021-08-10 LAB — SURGICAL PATHOLOGY

## 2021-08-11 ENCOUNTER — Other Ambulatory Visit: Payer: Self-pay

## 2021-08-11 ENCOUNTER — Ambulatory Visit: Payer: Medicare Other | Attending: Family | Admitting: Family

## 2021-08-11 ENCOUNTER — Ambulatory Visit (INDEPENDENT_AMBULATORY_CARE_PROVIDER_SITE_OTHER): Payer: Medicare Other | Admitting: Urology

## 2021-08-11 ENCOUNTER — Encounter: Payer: Self-pay | Admitting: Urology

## 2021-08-11 ENCOUNTER — Telehealth: Payer: Self-pay

## 2021-08-11 ENCOUNTER — Ambulatory Visit: Payer: Medicare Other | Admitting: Family

## 2021-08-11 ENCOUNTER — Encounter: Payer: Self-pay | Admitting: Family

## 2021-08-11 VITALS — BP 93/56 | HR 67 | Ht 66.0 in | Wt 124.0 lb

## 2021-08-11 VITALS — BP 103/53 | HR 78 | Resp 16 | Ht 64.0 in | Wt 124.0 lb

## 2021-08-11 DIAGNOSIS — Z95810 Presence of automatic (implantable) cardiac defibrillator: Secondary | ICD-10-CM | POA: Insufficient documentation

## 2021-08-11 DIAGNOSIS — I482 Chronic atrial fibrillation, unspecified: Secondary | ICD-10-CM | POA: Diagnosis not present

## 2021-08-11 DIAGNOSIS — D696 Thrombocytopenia, unspecified: Secondary | ICD-10-CM | POA: Insufficient documentation

## 2021-08-11 DIAGNOSIS — C670 Malignant neoplasm of trigone of bladder: Secondary | ICD-10-CM | POA: Diagnosis not present

## 2021-08-11 DIAGNOSIS — Z87891 Personal history of nicotine dependence: Secondary | ICD-10-CM | POA: Insufficient documentation

## 2021-08-11 DIAGNOSIS — N183 Chronic kidney disease, stage 3 unspecified: Secondary | ICD-10-CM | POA: Insufficient documentation

## 2021-08-11 DIAGNOSIS — I130 Hypertensive heart and chronic kidney disease with heart failure and stage 1 through stage 4 chronic kidney disease, or unspecified chronic kidney disease: Secondary | ICD-10-CM | POA: Insufficient documentation

## 2021-08-11 DIAGNOSIS — Z466 Encounter for fitting and adjustment of urinary device: Secondary | ICD-10-CM

## 2021-08-11 DIAGNOSIS — E785 Hyperlipidemia, unspecified: Secondary | ICD-10-CM | POA: Insufficient documentation

## 2021-08-11 DIAGNOSIS — D649 Anemia, unspecified: Secondary | ICD-10-CM | POA: Diagnosis not present

## 2021-08-11 DIAGNOSIS — I10 Essential (primary) hypertension: Secondary | ICD-10-CM

## 2021-08-11 DIAGNOSIS — I2510 Atherosclerotic heart disease of native coronary artery without angina pectoris: Secondary | ICD-10-CM | POA: Insufficient documentation

## 2021-08-11 DIAGNOSIS — K219 Gastro-esophageal reflux disease without esophagitis: Secondary | ICD-10-CM | POA: Insufficient documentation

## 2021-08-11 DIAGNOSIS — Z8673 Personal history of transient ischemic attack (TIA), and cerebral infarction without residual deficits: Secondary | ICD-10-CM | POA: Insufficient documentation

## 2021-08-11 DIAGNOSIS — I4891 Unspecified atrial fibrillation: Secondary | ICD-10-CM | POA: Diagnosis not present

## 2021-08-11 DIAGNOSIS — R0602 Shortness of breath: Secondary | ICD-10-CM | POA: Insufficient documentation

## 2021-08-11 DIAGNOSIS — I5022 Chronic systolic (congestive) heart failure: Secondary | ICD-10-CM | POA: Diagnosis not present

## 2021-08-11 DIAGNOSIS — E1122 Type 2 diabetes mellitus with diabetic chronic kidney disease: Secondary | ICD-10-CM | POA: Diagnosis not present

## 2021-08-11 DIAGNOSIS — Z79899 Other long term (current) drug therapy: Secondary | ICD-10-CM | POA: Insufficient documentation

## 2021-08-11 DIAGNOSIS — N1831 Chronic kidney disease, stage 3a: Secondary | ICD-10-CM

## 2021-08-11 MED ORDER — CEPHALEXIN 250 MG PO CAPS
500.0000 mg | ORAL_CAPSULE | Freq: Once | ORAL | Status: AC
  Administered 2021-08-11: 500 mg via ORAL

## 2021-08-11 NOTE — Telephone Encounter (Signed)
Spoke with patient's brother and notified him that we would call closer to induction date to schedule treatments due to medication stock. BCG education was sent via my chart

## 2021-08-11 NOTE — Telephone Encounter (Signed)
-----   Message from Billey Co, MD sent at 08/11/2021 12:22 PM EST ----- Regarding: bcg Please schedule induction BCG to start in about 5 to 6 weeks, then surveillance cystoscopy in 2 months  Nickolas Madrid, MD 08/11/2021

## 2021-08-11 NOTE — Patient Instructions (Signed)
Bladder Cancer Bladder cancer is a condition in which abnormal tissue (a tumor) grows in the bladder. The bladder is the organ that holds urine. Two tubes (ureters) carry the urine from the kidneys to the bladder. The bladder wall is made of layers of tissue. Cancer that spreads through these layers of the bladder wall becomes more difficult to treat. What are the causes? The cause of this condition is not known. What increases the risk? The following factors may make you more likely to develop this condition: Smoking. Working where there are risks (occupational exposures), such as working with rubber, leather, clothing fabric, dyes, chemicals, and paint. Being 55 years of age or older. Being male. Having bladder inflammation that is long-term (chronic). Having a history of cancer, including: A family history of bladder cancer. Personal experience with bladder cancer. Having had certain treatments for cancer before. These include: Medicines to kill cancer cells (chemotherapy). Strong X-ray beams or capsules high in energy to kill cancer cells and shrink tumors (radiation therapy). Having been exposed to arsenic. This is a chemical element that can poison you. What are the signs or symptoms? Early symptoms of this condition include: Seeing blood in your urine. Feeling pain when urinating. Having infections of your urinary system (urinary tract infections or UTIs) that happen often. Having to urinate sooner or more often than usual. Later symptoms of this condition include: Not being able to urinate. Pain on one side of your lower back. Loss of appetite. Weight loss. Tiredness (fatigue). Swelling in your feet. Bone pain. How is this diagnosed? This condition is diagnosed based on: Your medical history. A physical exam. Lab tests, such as urine tests. Imaging tests. Your symptoms. You may also have other tests or procedures done, such as: A cystoscopy. A narrow tube is inserted  into your bladder through the organ that connects your bladder to the outside of your body (urethra). This is done to view the lining of your bladder for tumors. A biopsy. This procedure involves removing a tissue sample to look at it under a microscope to see if cancer is present. It is important to find out: How deeply into the bladder wall cancer has grown. Whether cancer has spread to any other parts of your body. This may require blood tests or imaging tests, such as a CT scan, MRI, bone scan, or X-rays. How is this treated? Your health care provider may recommend one or more types of treatment based on the stage of your cancer. The most common types of treatment are: Surgery to remove the cancer. Procedures that may be done include: Removing a tumor on the inside wall of the bladder (transurethral resection). Removing the bladder (cystectomy). Radiation therapy. This is often used together with chemotherapy. Chemotherapy. Immunotherapy. This uses medicines to help your immune system destroy cancer cells. Follow these instructions at home: Take over-the-counter and prescription medicines only as told by your health care provider. Eat a healthy diet. Some of your treatments might affect your appetite. Do not use any products that contain nicotine or tobacco, such as cigarettes, e-cigarettes, and chewing tobacco. If you need help quitting, ask your health care provider. Consider joining a support group. This may help you learn to cope with the stress of having bladder cancer. Tell your cancer care team if you develop side effects. Your team may be able to recommend ways to get relief. Keep all follow-up visits as told by your health care provider. This is important. Where to find more information American   Cancer Society: www.cancer.org National Cancer Institute (NCI): www.cancer.gov Contact a health care provider if: You have symptoms of a urinary tract infection. These  include: Fever. Chills. Weakness. Muscle aches. Pain in your abdomen. Urge to urinate that is stronger and happens more often than usual. Burning feeling in the bladder or urethra when you urinate. Get help right away if: There is blood in your urine. You cannot urinate. You have severe pain or other symptoms that do not go away. Summary Bladder cancer is a condition in which tumors grow in the bladder and cause illness. This condition is diagnosed based on your medical history, a physical exam, lab tests, imaging tests, and your symptoms. Your health care provider may recommend one or more types of treatment based on the stage of your cancer. Consider joining a support group. This may help you learn to cope with the stress of having bladder cancer. This information is not intended to replace advice given to you by your health care provider. Make sure you discuss any questions you have with your health care provider. Document Revised: 05/16/2019 Document Reviewed: 05/16/2019 Elsevier Patient Education  2022 Elsevier Inc.   Bacillus Calmette-Guerin Live, BCG intravesical solution What is this medication? BACILLUS CALMETTE-GUERIN LIVE, BCG (ba SIL us  KAL met  gay RAYN) is a bacteria solution. This medicine stimulates the immune system to ward off cancer cells. It is used to treat bladder cancer. This medicine may be used for other purposes; ask your health care provider or pharmacist if you have questions. COMMON BRAND NAME(S): Theracys, TICE BCG What should I tell my care team before I take this medication? They need to know if you have any of these conditions: aneurysm blood in the urine bladder biopsy within 2 weeks fever or infection immune system problems leukemia lymphoma myasthenia gravis need organ transplant prosthetic device like arterial graft, artificial joint, prosthetic heart valve recent or ongoing radiation therapy tuberculosis an unusual or allergic reaction  to Bacillus Calmette-Guerin Live, BCG, latex, other medicines, foods, dyes, or preservatives pregnant or trying to get pregnant breast-feeding How should I use this medication? This drug is given as a catheter infusion into the bladder. It is administered in a hospital or clinic by a specially trained health care provider. You will be given directions to follow before the treatment. Follow your health care provider's directions carefully. This medicine contains live bacteria. It is very important to follow these directions closely after treatment to prevent others from coming in contact with your urine. Your health care provider may give you additional directions to follow. Try to hold this medicine in your bladder for 1 to 2 hours. Follow these directions the first time you go to the bathroom and for 6 hours after the first void. Wash your hands before using the restroom. After voiding, wash your hands and genital area. Use a toilet and sit when going to the bathroom. This helps to prevent the urine from splashing. Do not use public toilets or void outside. After each void, add 2 cups of undiluted bleach to the toilet. Close the lid. Wait 15 to 20 minutes and then flush the toilet. After the first void, drink more fluids to help dilute your urine. If you have urinary incontinence, wash the clothes you were wearing in the washer immediately. Do not wash other clothes at the same time. If you are wearing an incontinence pad, pour bleach on the pad and allow it to soak into the pad before throwing it away.   Put the pad in a plastic bag and put it in the trash. Talk to your pediatrician regarding the use of this medicine in children. Special care may be needed. Overdosage: If you think you have taken too much of this medicine contact a poison control center or emergency room at once. NOTE: This medicine is only for you. Do not share this medicine with others. What if I miss a dose? It is important not to  miss your dose. Call your doctor or health care professional if you are unable to keep an appointment. What may interact with this medication? antibiotics medicines to suppress your immune system like chemotherapy agents or corticosteroids medicine to treat tuberculosis This list may not describe all possible interactions. Give your health care provider a list of all the medicines, herbs, non-prescription drugs, or dietary supplements you use. Also tell them if you smoke, drink alcohol, or use illegal drugs. Some items may interact with your medicine. What should I watch for while using this medication? Visit your health care provider for checks on your progress. This medicine may make you feel generally unwell. Contact your health care provider if your symptoms last more than 2 days or if they get worse. Call your health care provider right away if you have a severe or unusual symptom. Infection can be spread to others through contact with this medicine. To prevent the spread of infection, follow your health care provider's directions carefully after treatment. Do not become pregnant while taking this medicine. There is a potential for serious side effects to an unborn child. Talk to your health care provider for more information. Do not breast-feed an infant while taking this medicine. If you have sex while on this medicine, use a condom. Ask your health care provider how long you should use a condom. What side effects may I notice from receiving this medication? Side effects that you should report to your doctor or health care professional as soon as possible: allergic reactions like skin rash, itching or hives, swelling of the face, lips, or tongue signs of infection - fever or chills, cough, sore throat, pain or difficulty passing urine signs of decreased red blood cells - unusually weak or tired, fainting spells, lightheadedness blood in urine breathing problems cough eye pain,  redness flu-like symptoms joint pain bladder-area pain for more than 2 days after treatment trouble passing urine or change in the amount of urine vomiting yellowing of the eyes or skin Side effects that usually do not require medical attention (report to your doctor or health care professional if they continue or are bothersome): bladder spasm burning when passing urine within 2 days of treatment feel need to pass urine often or wake up at night to pass urine loss of appetite This list may not describe all possible side effects. Call your doctor for medical advice about side effects. You may report side effects to FDA at 1-800-FDA-1088. Where should I keep my medication? This drug is given in a hospital or clinic and will not be stored at home. NOTE: This sheet is a summary. It may not cover all possible information. If you have questions about this medicine, talk to your doctor, pharmacist, or health care provider.  2022 Elsevier/Gold Standard (2018-11-10 00:00:00)   

## 2021-08-11 NOTE — Patient Instructions (Addendum)
Continue weighing daily and call for an overnight weight gain of > 2 pounds or a weekly weight gain of >5 pounds.     Follow up with [place] as needed in the future.

## 2021-08-11 NOTE — Progress Notes (Signed)
   08/11/2021 12:19 PM   [name] Dec 30, 1945 [id number]  Reason for visit: Discuss pathology results  HPI: I saw Walter Blair and his brother in clinic today to review pathology results.  Briefly, comorbid 75 year old male who had at least a few months of gross hematuria and ultimately was found to have a 3.5 cm bladder tumor overlying the left ureteral orifice.  He underwent complete resection on 08/07/2021, and bilateral retrograde pyelogram showed no evidence of upper tract filling defects at that time.  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  Postop gemcitabine was instilled.  Urine in his Foley is clear yellow.  Keflex was given for prophylaxis, and his Foley was removed today.  We discussed his new diagnosis of noninvasive bladder cancer, and we reviewed the AUA guidelines.  With his comorbidities, complete resection, and noninvasive disease, I did not recommend a repeat second look TURBT.  I recommended induction BCG, and risks and benefits were discussed at length.  We discussed the need for close surveillance cystoscopy moving forward.  Return precautions discussed at length.  Okay to resume Xarelto.  Will call to schedule induction BCG to start in about 6 weeks Surveillance cystoscopy every 3 to 4 months for the first 2 years  [place], [place] [address], [place], [place] [address] [phone]

## 2021-08-11 NOTE — Progress Notes (Signed)
Patient ID: Walter Blair, male    DOB: 09-Jan-1946, 75 y.o.   MRN: [id number]  HPI  Walter Blair is a 75 y/o male with a history of CAD, Dm, hyperlipidemia, HTN, CKD, stroke, thyroid disease, atrial fibrillation, GERD, anemia, thrombocytopenia, former tobacco use and chronic heart failure.   Echo report from 07/25/21 reviewed and showed an EF of 20-25% along with mild LVH, mildly elevated PA pressure and moderate/severe Walter.   Admitted 07/24/21 due to shortness of breath and worsening pedal edema. Lasix drip started and then transitioned to oral diuretics. Cardiology, oncology and nephrology consults obtained. Entresto held due to soft BP's. Discharged after 4 days.   He presents today for his initial visit with a chief complaint of minimal shortness of breath upon moderate exertion. He describes this as chronic in nature having been present for several months. He has associated fatigue along with this. He denies any difficulty sleeping, dizziness, abdominal distention, palpitations, pedal edema, chest pain, cough or weight gain.   Says that cardiology told him to take both the entresto & vericiguat BID and would like some samples as he is waiting on a PA for these medications.   Past Medical History:  Diagnosis Date   AICD (automatic cardioverter/defibrillator) present    Aortic atherosclerosis ([place])    Atrial fibrillation (HCC)    a.) CHA2DS2-VASc = 7 (age x 2, CHF, CVA x 2, aortic plaque, T2DM). b.) rate/rhythm maintained on oral amiodarone; chronically anticoagulated on rivaroxaban. c.) s/p DCCV 07/01/2017 and 07/05/2017   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   a.) cystoscopy 08/05/2021 --> ~2-3 cm papillary tumor at the LEFT bladder base   CAD (coronary artery disease)    CHF (congestive heart failure) (HCC)    CKD (chronic kidney disease), stage III (HCC)    Dyspnea    GERD (gastroesophageal reflux disease)    HFrEF (heart failure with reduced ejection fraction) ([place])    a.) TTE 06/29/2017: EF  10-15%; diffuse HK; mild AR, severe Walter/TR, mod PR; BAE. b.) TTE 07/25/2021: EF 20-25%; global HK; G3DD; severely reduced RV function; Severe BAR; mod-severe Walter, severe TR, mild AR.   HLD (hyperlipidemia)    HTN (hypertension)    Hypothyroidism    IDA (iron deficiency anemia)    Ischemic cardiomyopathy    a.) TTE 06/29/2017: EF 10-15%. b.) TTE 07/25/2021: ED 20-25%.   Left-sided cerebrovascular accident (CVA) ([place]) 2004   a.) residual weakness in LEFT hand   Long term current use of anticoagulant    a.) rivaroxaban   S/P CABG x 4 2003   Performed in [place]   T2DM (type 2 diabetes mellitus) ([place])    Thrombocytopenia ([place])    possibly associated with underlying DIC   Past Surgical History:  Procedure Laterality Date   BLADDER INSTILLATION N/A 08/07/2021   Procedure: BLADDER INSTILLATION OF GEMCITABINE;  Surgeon: Walter Co, Walter Blair;  Location: [facility] ORS;  Service: Urology;  Laterality: N/A;   CARDIAC DEFIBRILLATOR PLACEMENT     CARDIOVERSION N/A 07/01/2017   Procedure: CARDIOVERSION;  Surgeon: Walter David, Walter Blair;  Location: [facility] ORS;  Service: Cardiovascular;  Laterality: N/A;   CARDIOVERSION N/A 07/05/2017   Procedure: CARDIOVERSION;  Surgeon: Walter David, Walter Blair;  Location: [facility] ORS;  Service: Cardiovascular;  Laterality: N/A;   COLONOSCOPY WITH PROPOFOL N/A 12/30/2020   Procedure: COLONOSCOPY WITH PROPOFOL;  Surgeon: Walter Bellows, Walter Blair;  Location[facility] ENDOSCOPY;  Service: Gastroenterology;  Laterality: N/A;   CORONARY ARTERY BYPASS GRAFT N/A 2003   Procedure: Four-vessel  CABG performed in [place] Bilateral 08/07/2021   Procedure: CYSTOSCOPY WITH RETROGRADE PYELOGRAM;  Surgeon: Walter Co, Walter Blair;  Location: [facility] ORS;  Service: Urology;  Laterality: Bilateral;   ESOPHAGOGASTRODUODENOSCOPY  12/30/2020   Procedure: ESOPHAGOGASTRODUODENOSCOPY (EGD);  Surgeon: Walter Bellows, Walter Blair;  Location[facility] ENDOSCOPY;  Service: Gastroenterology;;   GIVENS  CAPSULE STUDY N/A 02/02/2021   Procedure: GIVENS CAPSULE STUDY;  Surgeon: Walter Bellows, Walter Blair;  Location: [facility] ENDOSCOPY;  Service: Gastroenterology;  Laterality: N/A;  WILL NEED INTERPRETER ON WHEELS;  BROTHER WANTS TO TRANSLATE   TEE WITHOUT CARDIOVERSION N/A 07/01/2017   Procedure: TRANSESOPHAGEAL ECHOCARDIOGRAM (TEE);  Surgeon: Walter David, Walter Blair;  Location: [facility] ORS;  Service: Cardiovascular;  Laterality: N/A;   TEE WITHOUT CARDIOVERSION N/A 07/05/2017   Procedure: TRANSESOPHAGEAL ECHOCARDIOGRAM (TEE);  Surgeon: Walter David, Walter Blair;  Location: [facility] ORS;  Service: Cardiovascular;  Laterality: N/A;   TRANSURETHRAL RESECTION OF BLADDER TUMOR N/A 08/07/2021   Procedure: TRANSURETHRAL RESECTION OF BLADDER TUMOR (TURBT);  Surgeon: Walter Co, Walter Blair;  Location: [facility] ORS;  Service: Urology;  Laterality: N/A;   URETEROSCOPY Left 08/07/2021   Procedure: DIAGNOSTIC URETEROSCOPY;  Surgeon: Walter Co, Walter Blair;  Location: [facility] ORS;  Service: Urology;  Laterality: Left;   Family History  Problem Relation Age of Onset   Diabetes Brother    Hypertension Mother    Diabetes Sister    Social History   Tobacco Use   Smoking status: Former    Types: Cigarettes   Smokeless tobacco: Former    Types: Chew  Substance Use Topics   Alcohol use: No   Allergies  Allergen Reactions   Lorazepam Shortness Of Breath and Other (See Comments)    Pt experienced adverse reaction and was transferred to ICU last time they were given Med Other reaction(s): Other (See Comments) Pt experienced adverse reaction and was transferred to ICU last time they were given Med   Prior to Admission medications   Medication Sig Start Date End Date Taking? Authorizing Provider  amiodarone (PACERONE) 200 MG tablet One tablet twice a day for one week then one tablet daily Patient taking differently: Take 200 mg by mouth daily. Half a tablet daily 07/06/17  Yes Walter Blair, Richard, Walter Blair  atorvastatin (LIPITOR) 80 MG tablet Take 80 mg  by mouth daily. 10/03/20  Yes Provider, Historical, Walter Blair  ferrous sulfate (FERROUSUL) 325 (65 FE) MG tablet Take 1 tablet twice a day every other day for 90 days. 12/18/20  Yes Walter Bellows, Walter Blair  folic acid (FOLVITE) 1 MG tablet Take 1 mg by mouth every morning.   Yes Provider, Historical, Walter Blair  glyBURIDE (DIABETA) 2.5 MG tablet Take 2.5 mg by mouth daily.   Yes Provider, Historical, Walter Blair  levothyroxine (SYNTHROID) 50 MCG tablet Take 50 mcg by mouth daily before breakfast. 07/13/21  Yes Provider, Historical, Walter Blair  [facility] ULTRA test strip daily. 04/21/21  Yes Provider, Historical, Walter Blair  pantoprazole (PROTONIX) 20 MG tablet Take 20 mg by mouth daily.   Yes Provider, Historical, Walter Blair  polyethylene glycol (MIRALAX / GLYCOLAX) packet Take 17 g by mouth daily as needed. 07/06/17  Yes Walter Blair, Richard, Walter Blair  sacubitril-valsartan (ENTRESTO) 24-26 MG Take 1 tablet by mouth 2 (two) times daily. 07/06/17  Yes Walter Blair, Richard, Walter Blair  spironolactone (ALDACTONE) 25 MG tablet Take 0.5 tablets (12.5 mg total) by mouth daily. 07/06/17  Yes Walter Blair, Richard, Walter Blair  torsemide (DEMADEX) 20 MG tablet Take 1 tablet (20 mg total) by mouth daily. 07/28/21  Yes Walter Blair,  Amrit, Walter Blair  Vericiguat 5 MG TABS Take 5 mg by mouth 2 (two) times daily.   Yes Provider, Historical, Walter Blair    Review of Systems  Constitutional:  Positive for fatigue. Negative for appetite change.  HENT:  Negative for congestion, postnasal drip and sore throat.   Eyes: Negative.   Respiratory:  Positive for shortness of breath. Negative for cough and chest tightness.   Cardiovascular:  Negative for chest pain, palpitations and leg swelling.  Gastrointestinal:  Negative for abdominal distention and abdominal pain.  Endocrine: Negative.   Genitourinary: Negative.   Musculoskeletal:  Negative for back pain and neck pain.  Skin: Negative.   Allergic/Immunologic: Negative.   Neurological:  Negative for dizziness and light-headedness.  Hematological:  Negative for adenopathy.  Does not bruise/bleed easily.  Psychiatric/Behavioral:  Negative for dysphoric mood and sleep disturbance. The patient is not nervous/anxious.    Vitals:   08/11/21 1316  BP: (!) 103/53  Pulse: 78  Resp: 16  SpO2: 95%  Weight: 124 lb (56.2 kg)  Height: 5\' 4"  (1.626 m)   Wt Readings from Last 3 Encounters:  08/11/21 124 lb (56.2 kg)  08/11/21 124 lb (56.2 kg)  08/07/21 128 lb (58.1 kg)   Lab Results  Component Value Date   CREATININE 1.70 (H) 08/07/2021   CREATININE 1.56 (H) 08/04/2021   CREATININE 1.70 (H) 07/28/2021   Physical Exam Vitals and nursing note reviewed. Exam conducted with a chaperone present (brother).  Constitutional:      Appearance: Normal appearance.  HENT:     Head: Normocephalic and atraumatic.  Cardiovascular:     Rate and Rhythm: Normal rate and regular rhythm.  Pulmonary:     Effort: Pulmonary effort is normal. No respiratory distress.     Breath sounds: No wheezing or rales.  Abdominal:     General: There is no distension.     Palpations: Abdomen is soft.  Musculoskeletal:        General: No tenderness.     Cervical back: Normal range of motion and neck supple.     Right lower leg: No edema.     Left lower leg: No edema.  Skin:    General: Skin is warm and dry.  Neurological:     General: No focal deficit present.     Mental Status: He is alert and oriented to person, place, and time.  Psychiatric:        Mood and Affect: Mood normal.        Behavior: Behavior normal.        Thought Content: Thought content normal.    Assessment & Plan:  1: Chronic heart failure with reduced ejection fraction- - NYHA class II - euvolemic today - weighing daily and understands to call for an overnight weight gain of > 2 pounds or a weekly weight gain of > 5 pounds - was previously adding salt but no longer adds salt to his food; low sodium cookbook provided to him - saw cardiology Walter Blair) 1 week ago - on GDMT of entresto and spironolactone - has been  taking vericiguat BID as he says that Dr. Humphrey Blair instructed him to take it BID - 2 weeks samples of vericiguat and 1 month samples of entresto 24/26mg  provided today - BP may be too low for spironolactone - BNP 07/24/21 was 3305.3  2: HTN- - BP looks good (103/53) - sees PCP (Walter Blair)   - BMP 08/07/21 reviewed and showed sodium 134, potassium 3.8, creatinine 1.7 and GFR  46  3: DM- - currently taking glyburide  4: Atrial fibrillation- - has AICD present - currently taking amiodarone   Medication bottles reviewed.   Due to HF stability, will not make a return appointment for patient at this time. Advised patient to follow closely with cardiology but that he could call back at anytime to schedule another appointment and he was comfortable with this plan.

## 2021-08-12 ENCOUNTER — Telehealth: Payer: Self-pay | Admitting: Nurse Practitioner

## 2021-08-12 NOTE — Telephone Encounter (Signed)
Spoke with patient's brother Warden Fillers and told him that I was calling from [place] and he said that he was busy and requested I call him back on Friday, 08/14/21 and I told him that I wasn't working Friday and he said that Monday would be fine.  I did let him know why I was calling and confirmed with brother that he was the person that we needed to speak with to schedule the visit

## 2021-08-18 ENCOUNTER — Telehealth: Payer: Self-pay

## 2021-08-18 NOTE — Telephone Encounter (Signed)
2 nd message left to schedule patient's palliative care consult

## 2021-08-24 ENCOUNTER — Telehealth: Payer: Self-pay | Admitting: Nurse Practitioner

## 2021-08-24 NOTE — Telephone Encounter (Signed)
Spoke with patient's brother, Warden Fillers regarding the Palliative referral/services and all questions were answered and he was in agreement with scheduling visit.  I have scheduled an In-home Consult for 09/24/21 @ 9 AM.

## 2021-09-01 DIAGNOSIS — I5022 Chronic systolic (congestive) heart failure: Secondary | ICD-10-CM | POA: Insufficient documentation

## 2021-09-01 DIAGNOSIS — Z79899 Other long term (current) drug therapy: Secondary | ICD-10-CM | POA: Insufficient documentation

## 2021-09-01 DIAGNOSIS — C679 Malignant neoplasm of bladder, unspecified: Secondary | ICD-10-CM | POA: Insufficient documentation

## 2021-09-14 ENCOUNTER — Emergency Department: Payer: Medicare Other

## 2021-09-14 ENCOUNTER — Other Ambulatory Visit: Payer: Self-pay

## 2021-09-14 ENCOUNTER — Inpatient Hospital Stay
Admission: EM | Admit: 2021-09-14 | Discharge: 2021-09-19 | DRG: 378 | Disposition: A | Discharge status: Home Health | Payer: Medicare Other | Attending: Internal Medicine | Admitting: Internal Medicine

## 2021-09-14 ENCOUNTER — Encounter: Payer: Self-pay | Admitting: *Deleted

## 2021-09-14 DIAGNOSIS — E039 Hypothyroidism, unspecified: Secondary | ICD-10-CM | POA: Diagnosis present

## 2021-09-14 DIAGNOSIS — E11649 Type 2 diabetes mellitus with hypoglycemia without coma: Secondary | ICD-10-CM | POA: Diagnosis present

## 2021-09-14 DIAGNOSIS — I5042 Chronic combined systolic (congestive) and diastolic (congestive) heart failure: Secondary | ICD-10-CM | POA: Diagnosis present

## 2021-09-14 DIAGNOSIS — N1832 Chronic kidney disease, stage 3b: Secondary | ICD-10-CM | POA: Diagnosis present

## 2021-09-14 DIAGNOSIS — E1165 Type 2 diabetes mellitus with hyperglycemia: Secondary | ICD-10-CM | POA: Diagnosis not present

## 2021-09-14 DIAGNOSIS — E871 Hypo-osmolality and hyponatremia: Secondary | ICD-10-CM | POA: Diagnosis not present

## 2021-09-14 DIAGNOSIS — D500 Iron deficiency anemia secondary to blood loss (chronic): Secondary | ICD-10-CM

## 2021-09-14 DIAGNOSIS — D689 Coagulation defect, unspecified: Secondary | ICD-10-CM | POA: Diagnosis present

## 2021-09-14 DIAGNOSIS — R531 Weakness: Secondary | ICD-10-CM

## 2021-09-14 DIAGNOSIS — I959 Hypotension, unspecified: Secondary | ICD-10-CM | POA: Diagnosis not present

## 2021-09-14 DIAGNOSIS — D649 Anemia, unspecified: Secondary | ICD-10-CM | POA: Diagnosis not present

## 2021-09-14 DIAGNOSIS — E1122 Type 2 diabetes mellitus with diabetic chronic kidney disease: Secondary | ICD-10-CM | POA: Diagnosis present

## 2021-09-14 DIAGNOSIS — E875 Hyperkalemia: Secondary | ICD-10-CM | POA: Diagnosis present

## 2021-09-14 DIAGNOSIS — I482 Chronic atrial fibrillation, unspecified: Secondary | ICD-10-CM | POA: Diagnosis present

## 2021-09-14 DIAGNOSIS — I2510 Atherosclerotic heart disease of native coronary artery without angina pectoris: Secondary | ICD-10-CM | POA: Diagnosis not present

## 2021-09-14 DIAGNOSIS — I340 Nonrheumatic mitral (valve) insufficiency: Secondary | ICD-10-CM | POA: Diagnosis present

## 2021-09-14 DIAGNOSIS — Z95810 Presence of automatic (implantable) cardiac defibrillator: Secondary | ICD-10-CM

## 2021-09-14 DIAGNOSIS — E872 Acidosis, unspecified: Secondary | ICD-10-CM | POA: Diagnosis present

## 2021-09-14 DIAGNOSIS — I130 Hypertensive heart and chronic kidney disease with heart failure and stage 1 through stage 4 chronic kidney disease, or unspecified chronic kidney disease: Secondary | ICD-10-CM | POA: Diagnosis present

## 2021-09-14 DIAGNOSIS — N179 Acute kidney failure, unspecified: Secondary | ICD-10-CM | POA: Diagnosis present

## 2021-09-14 DIAGNOSIS — I5023 Acute on chronic systolic (congestive) heart failure: Secondary | ICD-10-CM

## 2021-09-14 DIAGNOSIS — I252 Old myocardial infarction: Secondary | ICD-10-CM

## 2021-09-14 DIAGNOSIS — E785 Hyperlipidemia, unspecified: Secondary | ICD-10-CM | POA: Diagnosis present

## 2021-09-14 DIAGNOSIS — I159 Secondary hypertension, unspecified: Secondary | ICD-10-CM

## 2021-09-14 DIAGNOSIS — I69334 Monoplegia of upper limb following cerebral infarction affecting left non-dominant side: Secondary | ICD-10-CM

## 2021-09-14 DIAGNOSIS — I5043 Acute on chronic combined systolic (congestive) and diastolic (congestive) heart failure: Secondary | ICD-10-CM | POA: Diagnosis present

## 2021-09-14 DIAGNOSIS — Z7901 Long term (current) use of anticoagulants: Secondary | ICD-10-CM

## 2021-09-14 DIAGNOSIS — I480 Paroxysmal atrial fibrillation: Secondary | ICD-10-CM | POA: Diagnosis not present

## 2021-09-14 DIAGNOSIS — K921 Melena: Secondary | ICD-10-CM | POA: Diagnosis present

## 2021-09-14 DIAGNOSIS — E119 Type 2 diabetes mellitus without complications: Secondary | ICD-10-CM

## 2021-09-14 DIAGNOSIS — I10 Essential (primary) hypertension: Secondary | ICD-10-CM | POA: Diagnosis present

## 2021-09-14 DIAGNOSIS — D631 Anemia in chronic kidney disease: Secondary | ICD-10-CM | POA: Diagnosis present

## 2021-09-14 DIAGNOSIS — D696 Thrombocytopenia, unspecified: Secondary | ICD-10-CM | POA: Diagnosis not present

## 2021-09-14 DIAGNOSIS — Z79899 Other long term (current) drug therapy: Secondary | ICD-10-CM

## 2021-09-14 DIAGNOSIS — D494 Neoplasm of unspecified behavior of bladder: Secondary | ICD-10-CM | POA: Diagnosis present

## 2021-09-14 DIAGNOSIS — Z951 Presence of aortocoronary bypass graft: Secondary | ICD-10-CM

## 2021-09-14 DIAGNOSIS — D62 Acute posthemorrhagic anemia: Secondary | ICD-10-CM | POA: Diagnosis present

## 2021-09-14 DIAGNOSIS — Z862 Personal history of diseases of the blood and blood-forming organs and certain disorders involving the immune mechanism: Secondary | ICD-10-CM

## 2021-09-14 DIAGNOSIS — E162 Hypoglycemia, unspecified: Secondary | ICD-10-CM | POA: Diagnosis present

## 2021-09-14 DIAGNOSIS — K922 Gastrointestinal hemorrhage, unspecified: Secondary | ICD-10-CM

## 2021-09-14 DIAGNOSIS — I2583 Coronary atherosclerosis due to lipid rich plaque: Secondary | ICD-10-CM

## 2021-09-14 DIAGNOSIS — R0602 Shortness of breath: Secondary | ICD-10-CM

## 2021-09-14 DIAGNOSIS — R079 Chest pain, unspecified: Secondary | ICD-10-CM

## 2021-09-14 DIAGNOSIS — K219 Gastro-esophageal reflux disease without esophagitis: Secondary | ICD-10-CM | POA: Diagnosis present

## 2021-09-14 DIAGNOSIS — Z8249 Family history of ischemic heart disease and other diseases of the circulatory system: Secondary | ICD-10-CM

## 2021-09-14 DIAGNOSIS — I700 Atherosclerosis of aorta: Secondary | ICD-10-CM | POA: Diagnosis present

## 2021-09-14 DIAGNOSIS — D509 Iron deficiency anemia, unspecified: Secondary | ICD-10-CM | POA: Diagnosis present

## 2021-09-14 DIAGNOSIS — Z20822 Contact with and (suspected) exposure to covid-19: Secondary | ICD-10-CM | POA: Diagnosis present

## 2021-09-14 DIAGNOSIS — Z888 Allergy status to other drugs, medicaments and biological substances status: Secondary | ICD-10-CM

## 2021-09-14 DIAGNOSIS — I255 Ischemic cardiomyopathy: Secondary | ICD-10-CM | POA: Diagnosis present

## 2021-09-14 DIAGNOSIS — Z833 Family history of diabetes mellitus: Secondary | ICD-10-CM

## 2021-09-14 DIAGNOSIS — Z7984 Long term (current) use of oral hypoglycemic drugs: Secondary | ICD-10-CM

## 2021-09-14 LAB — CBC WITH DIFFERENTIAL/PLATELET
Abs Immature Granulocytes: 0.02 10*3/uL (ref 0.00–0.07)
Basophils Absolute: 0 10*3/uL (ref 0.0–0.1)
Basophils Relative: 0 %
Eosinophils Absolute: 0 10*3/uL (ref 0.0–0.5)
Eosinophils Relative: 0 %
HCT: 20.5 % — ABNORMAL LOW (ref 39.0–52.0)
Hemoglobin: 6.5 g/dL — ABNORMAL LOW (ref 13.0–17.0)
Immature Granulocytes: 0 %
Lymphocytes Relative: 4 %
Lymphs Abs: 0.3 10*3/uL — ABNORMAL LOW (ref 0.7–4.0)
MCH: 30.4 pg (ref 26.0–34.0)
MCHC: 31.7 g/dL (ref 30.0–36.0)
MCV: 95.8 fL (ref 80.0–100.0)
Monocytes Absolute: 0.4 10*3/uL (ref 0.1–1.0)
Monocytes Relative: 5 %
Neutro Abs: 6.7 10*3/uL (ref 1.7–7.7)
Neutrophils Relative %: 91 %
Platelets: 214 10*3/uL (ref 150–400)
RBC: 2.14 MIL/uL — ABNORMAL LOW (ref 4.22–5.81)
RDW: 17.3 % — ABNORMAL HIGH (ref 11.5–15.5)
WBC: 7.4 10*3/uL (ref 4.0–10.5)
nRBC: 0 % (ref 0.0–0.2)

## 2021-09-14 LAB — CBG MONITORING, ED
Glucose-Capillary: 42 mg/dL — CL (ref 70–99)
Glucose-Capillary: 43 mg/dL — CL (ref 70–99)
Glucose-Capillary: 70 mg/dL (ref 70–99)

## 2021-09-14 LAB — COMPREHENSIVE METABOLIC PANEL
ALT: 22 U/L (ref 0–44)
AST: 34 U/L (ref 15–41)
Albumin: 3.5 g/dL (ref 3.5–5.0)
Alkaline Phosphatase: 68 U/L (ref 38–126)
Anion gap: 9 (ref 5–15)
BUN: 56 mg/dL — ABNORMAL HIGH (ref 8–23)
CO2: 18 mmol/L — ABNORMAL LOW (ref 22–32)
Calcium: 9.3 mg/dL (ref 8.9–10.3)
Chloride: 105 mmol/L (ref 98–111)
Creatinine, Ser: 2.41 mg/dL — ABNORMAL HIGH (ref 0.61–1.24)
GFR, Estimated: 27 mL/min — ABNORMAL LOW (ref >60)
Glucose, Bld: 52 mg/dL — ABNORMAL LOW (ref 70–99)
Potassium: 5.7 mmol/L — ABNORMAL HIGH (ref 3.5–5.1)
Sodium: 132 mmol/L — ABNORMAL LOW (ref 135–145)
Total Bilirubin: 0.9 mg/dL (ref 0.3–1.2)
Total Protein: 7 g/dL (ref 6.5–8.1)

## 2021-09-14 LAB — TROPONIN I (HIGH SENSITIVITY)
Troponin I (High Sensitivity): 19 ng/L — ABNORMAL HIGH (ref <18)
Troponin I (High Sensitivity): 19 ng/L — ABNORMAL HIGH (ref <18)

## 2021-09-14 LAB — BRAIN NATRIURETIC PEPTIDE: B Natriuretic Peptide: 2575.8 pg/mL — ABNORMAL HIGH (ref 0.0–100.0)

## 2021-09-14 LAB — PREPARE RBC (CROSSMATCH)

## 2021-09-14 LAB — RESP PANEL BY RT-PCR (FLU A&B, COVID) ARPGX2
Influenza A by PCR: NEGATIVE
Influenza B by PCR: NEGATIVE
SARS Coronavirus 2 by RT PCR: NEGATIVE

## 2021-09-14 MED ORDER — SPIRONOLACTONE 25 MG PO TABS
12.5000 mg | ORAL_TABLET | Freq: Every day | ORAL | Status: DC
  Filled 2021-09-14: qty 0.5

## 2021-09-14 MED ORDER — OXYCODONE HCL 5 MG PO TABS: 5.0000 mg | ORAL_TABLET | ORAL | Status: DC | PRN

## 2021-09-14 MED ORDER — SODIUM CHLORIDE 0.9 % IV SOLN: 10.0000 mL/h | Freq: Once | INTRAVENOUS | Status: DC

## 2021-09-14 MED ORDER — SODIUM CHLORIDE 0.9% FLUSH
3.0000 mL | Freq: Two times a day (BID) | INTRAVENOUS | Status: DC
  Administered 2021-09-15 – 2021-09-19 (×8): 3 mL via INTRAVENOUS

## 2021-09-14 MED ORDER — ACETAMINOPHEN 650 MG RE SUPP: 650.0000 mg | Freq: Four times a day (QID) | RECTAL | Status: DC | PRN

## 2021-09-14 MED ORDER — PATIROMER SORBITEX CALCIUM 8.4 G PO PACK
16.8000 g | PACK | Freq: Every day | ORAL | Status: DC
  Administered 2021-09-15 – 2021-09-16 (×2): 16.8 g via ORAL
  Filled 2021-09-14 (×2): qty 2
  Filled 2021-09-14: qty 1
  Filled 2021-09-14 (×3): qty 2

## 2021-09-14 MED ORDER — SODIUM CHLORIDE 0.9 % IV BOLUS
500.0000 mL | Freq: Once | INTRAVENOUS | Status: AC
  Administered 2021-09-14: 23:00:00 500 mL via INTRAVENOUS

## 2021-09-14 MED ORDER — INSULIN ASPART 100 UNIT/ML IJ SOLN: 4.0000 [IU] | Freq: Three times a day (TID) | INTRAMUSCULAR | Status: DC

## 2021-09-14 MED ORDER — DOCUSATE SODIUM 100 MG PO CAPS
100.0000 mg | ORAL_CAPSULE | Freq: Two times a day (BID) | ORAL | Status: DC
  Administered 2021-09-16 – 2021-09-18 (×6): 100 mg via ORAL
  Filled 2021-09-14 (×6): qty 1

## 2021-09-14 MED ORDER — ACETAMINOPHEN 325 MG PO TABS: 650.0000 mg | ORAL_TABLET | Freq: Four times a day (QID) | ORAL | Status: DC | PRN

## 2021-09-14 MED ORDER — TRAZODONE HCL 50 MG PO TABS: 25.0000 mg | ORAL_TABLET | Freq: Every evening | ORAL | Status: DC | PRN

## 2021-09-14 MED ORDER — INSULIN ASPART 100 UNIT/ML IJ SOLN: 0.0000 [IU] | Freq: Three times a day (TID) | INTRAMUSCULAR | Status: DC

## 2021-09-14 MED ORDER — HYDRALAZINE HCL 20 MG/ML IJ SOLN: 5.0000 mg | INTRAMUSCULAR | Status: DC | PRN

## 2021-09-14 MED ORDER — DEXTROSE-NACL 5-0.9 % IV SOLN: INTRAVENOUS | Status: DC

## 2021-09-14 MED ORDER — BISACODYL 5 MG PO TBEC: 5.0000 mg | DELAYED_RELEASE_TABLET | Freq: Every day | ORAL | Status: DC | PRN

## 2021-09-14 MED ORDER — ONDANSETRON HCL 4 MG PO TABS: 4.0000 mg | ORAL_TABLET | Freq: Four times a day (QID) | ORAL | Status: DC | PRN

## 2021-09-14 MED ORDER — LEVOTHYROXINE SODIUM 88 MCG PO TABS
88.0000 ug | ORAL_TABLET | Freq: Every day | ORAL | Status: DC
  Administered 2021-09-15 – 2021-09-19 (×5): 88 ug via ORAL
  Filled 2021-09-14 (×5): qty 1

## 2021-09-14 MED ORDER — MORPHINE SULFATE (PF) 2 MG/ML IV SOLN: 2.0000 mg | INTRAVENOUS | Status: DC | PRN

## 2021-09-14 MED ORDER — DEXTROSE 50 % IV SOLN
12.5000 g | Freq: Once | INTRAVENOUS | Status: AC
  Administered 2021-09-14: 23:00:00 12.5 g via INTRAVENOUS
  Filled 2021-09-14: qty 50

## 2021-09-14 MED ORDER — ONDANSETRON HCL 4 MG/2ML IJ SOLN: 4.0000 mg | Freq: Four times a day (QID) | INTRAMUSCULAR | Status: DC | PRN

## 2021-09-14 MED ORDER — NICOTINE 14 MG/24HR TD PT24
14.0000 mg | MEDICATED_PATCH | Freq: Every day | TRANSDERMAL | Status: DC
  Administered 2021-09-16: 09:00:00 14 mg via TRANSDERMAL
  Filled 2021-09-14 (×3): qty 1

## 2021-09-14 MED ORDER — ATORVASTATIN CALCIUM 20 MG PO TABS
80.0000 mg | ORAL_TABLET | Freq: Every day | ORAL | Status: DC
  Administered 2021-09-15 – 2021-09-18 (×4): 80 mg via ORAL
  Filled 2021-09-14 (×4): qty 4

## 2021-09-14 MED ORDER — AMIODARONE HCL 200 MG PO TABS
100.0000 mg | ORAL_TABLET | Freq: Every day | ORAL | Status: DC
  Administered 2021-09-15 – 2021-09-19 (×5): 100 mg via ORAL
  Filled 2021-09-14 (×5): qty 1

## 2021-09-14 MED ORDER — FUROSEMIDE 10 MG/ML IJ SOLN
40.0000 mg | Freq: Two times a day (BID) | INTRAMUSCULAR | Status: DC
  Administered 2021-09-15 – 2021-09-16 (×2): 40 mg via INTRAVENOUS
  Filled 2021-09-14 (×2): qty 4

## 2021-09-14 MED ORDER — DEXTROSE 50 % IV SOLN: 25.0000 g | Freq: Once | INTRAVENOUS | Status: DC

## 2021-09-14 MED ORDER — POLYETHYLENE GLYCOL 3350 17 G PO PACK: 17.0000 g | PACK | Freq: Every day | ORAL | Status: DC | PRN

## 2021-09-14 NOTE — ED Triage Notes (Signed)
Pt to ED with chest pain, weakness and nausea for the past three days. Family reports he has been able to eat until today when he was reporting nausea and denied wanting to eat. Blood sugar was 63 at home when checked and then rose only to 64 after eating food. Pt also has hx of congestive heart failure and appears to have increased work of breathing after walking through lobby.  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

## 2021-09-14 NOTE — H&P (Signed)
History and Physical    Walter Blair OZD:[id number] DOB: 03/12/1946 DOA: 09/14/2021  PCP: Jodi Marble, MD    Patient coming from:  Home   Chief Complaint:  Generalized weakness and hypoglycemia.   HPI:  Walter Blair is a 75 y.o. male seen in ed with complaints of generalized weakness hypoglycemia decreased p.o. intake found to be hypoglycemic at home and brought to the emergency room via EMS.  Patient reported to be short of breath and hypoglycemic on arrival with BG's of 50s.  Patient reports of intermittent chest pain with injury and indigestion symptoms and dark brown stools over the past week or 2 patient is currently not on iron supplementation. Review of systems is negative otherwise for headaches blurred vision chest pain, shortness of breath palpitations fevers chills falls abdominal issues or diarrhea.  6/10 pain,NR, diaphoresis, + nausea,+3 days of gas pain with eating- TUMS helped , pain lasted depending on what he eat, starts little bit after eating.  Pt has past medical history of past medical history of heart disease status post AICD, atrial fibrillation, heart failure, CKD, GERD, hypertension, hypothyroidism, iron deficiency anemia, diabetes mellitus type 2, history of pancytopenia, history of stroke.  ED Course:  Vitals:   09/14/21 2032  BP: (!) 121/46  Pulse: 75  Resp: (!) 25  SpO2: 97%  Weight: 56.2 kg  Height: 5\' 4"  (1.626 m)  The emergency room patient is alert awake oriented afebrile oxygenating 97% on room air. Blood pressure hyperglycemia with a glucose of 42 143, CMP shows mild hyponatremia with sodium of 132 with potassium of 5.7, serum bicarb of 18, abnormal kidney function of 2.41.  BNP today of 2575.8, troponin of 19, CBC shows a white count of 7.4 hemoglobin of 6.5 RDW 17.9 platelets of 214.  Respiratory panel negative for flu and COVID.  In the emergency room patient received dextrose, Veltassa for potassium normal saline bolus  500.   Review of Systems:  Review of Systems  Constitutional:  Positive for malaise/fatigue.  HENT: Negative.    Eyes: Negative.   Respiratory:  Positive for cough and shortness of breath.   Cardiovascular:  Positive for chest pain, orthopnea and leg swelling.  Gastrointestinal:  Positive for blood in stool, constipation, diarrhea, heartburn, melena and nausea.  Genitourinary: Negative.   Musculoskeletal: Negative.   Skin: Negative.   Neurological:  Positive for weakness.  Endo/Heme/Allergies: Negative.   Psychiatric/Behavioral: Negative.      Past Medical History:  Diagnosis Date   AICD (automatic cardioverter/defibrillator) present    Aortic atherosclerosis ([place])    Atrial fibrillation (HCC)    a.) CHA2DS2-VASc = 7 (age x 2, CHF, CVA x 2, aortic plaque, T2DM). b.) rate/rhythm maintained on oral amiodarone; chronically anticoagulated on rivaroxaban. 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 b.) TTE 07/25/2021: EF 20-25%; global HK; G3DD; severely reduced RV function; Severe BAR; mod-severe MR, severe TR, mild AR.   HLD (hyperlipidemia)    HTN (hypertension)    Hypothyroidism    IDA (iron deficiency anemia)  Ischemic cardiomyopathy    a.) TTE 06/29/2017: EF 10-15%. b.) TTE 07/25/2021: ED 20-25%.   Left-sided cerebrovascular accident (CVA) ([place]) 2004   a.) residual weakness in LEFT hand   Long term current use of anticoagulant    a.) rivaroxaban   S/P CABG x 4 2003   Performed in [place]   T2DM (type 2 diabetes mellitus) ([place])    Thrombocytopenia  ([place])    possibly associated with underlying DIC    Past Surgical History:  Procedure Laterality Date   BLADDER INSTILLATION N/A 08/07/2021   Procedure: BLADDER INSTILLATION OF GEMCITABINE;  Surgeon: Billey Co, MD;  Location: [facility] ORS;  Service: Urology;  Laterality: N/A;   CARDIAC DEFIBRILLATOR PLACEMENT     CARDIOVERSION N/A 07/01/2017   Procedure: CARDIOVERSION;  Surgeon: Dionisio David, MD;  Location: [facility] ORS;  Service: Cardiovascular;  Laterality: N/A;   CARDIOVERSION N/A 07/05/2017   Procedure: CARDIOVERSION;  Surgeon: Dionisio David, MD;  Location: [facility] ORS;  Service: Cardiovascular;  Laterality: N/A;   COLONOSCOPY WITH PROPOFOL N/A 12/30/2020   Procedure: COLONOSCOPY WITH PROPOFOL;  Surgeon: [name], MD;  Location: [facility] ENDOSCOPY;  Service: Gastroenterology;  Laterality: N/A;   CORONARY ARTERY BYPASS GRAFT N/A 2003   Procedure: Four-vessel CABG performed in [place] Bilateral 08/07/2021   Procedure: CYSTOSCOPY WITH RETROGRADE PYELOGRAM;  Surgeon: Billey Co, MD;  Location: [facility] ORS;  Service: Urology;  Laterality: Bilateral;   ESOPHAGOGASTRODUODENOSCOPY  12/30/2020   Procedure: ESOPHAGOGASTRODUODENOSCOPY (EGD);  Surgeon: [name], MD;  Location: [facility] ENDOSCOPY;  Service: Gastroenterology;;   GIVENS CAPSULE STUDY N/A 02/02/2021   Procedure: GIVENS CAPSULE STUDY;  Surgeon: [name], MD;  Location: [facility] ENDOSCOPY;  Service: Gastroenterology;  Laterality: N/A;  WILL NEED INTERPRETER ON WHEELS;  BROTHER WANTS TO TRANSLATE   TEE WITHOUT CARDIOVERSION N/A 07/01/2017   Procedure: TRANSESOPHAGEAL ECHOCARDIOGRAM (TEE);  Surgeon: Dionisio David, MD;  Location: [facility] ORS;  Service: Cardiovascular;  Laterality: N/A;   TEE WITHOUT CARDIOVERSION N/A 07/05/2017   Procedure: TRANSESOPHAGEAL ECHOCARDIOGRAM (TEE);  Surgeon: Dionisio David, MD;  Location: [facility] ORS;  Service: Cardiovascular;  Laterality: N/A;   TRANSURETHRAL RESECTION OF  BLADDER TUMOR N/A 08/07/2021   Procedure: TRANSURETHRAL RESECTION OF BLADDER TUMOR (TURBT);  Surgeon: Billey Co, MD;  Location: [facility] ORS;  Service: Urology;  Laterality: N/A;   URETEROSCOPY Left 08/07/2021   Procedure: DIAGNOSTIC URETEROSCOPY;  Surgeon: Billey Co, MD;  Location: [facility] ORS;  Service: Urology;  Laterality: Left;     reports that he has quit smoking. His smoking use included cigarettes. He has quit using smokeless tobacco.  His smokeless tobacco use included chew. He reports that he does not drink alcohol and does not use drugs.  Allergies  Allergen Reactions   Lorazepam Shortness Of Breath and Other (See Comments)    Pt experienced adverse reaction and was transferred to ICU last time they were given Med Other reaction(s): Other (See Comments) Pt experienced adverse reaction and was transferred to ICU last time they were given Med    Family History  Problem Relation Age of Onset   Diabetes Brother    Hypertension Mother    Diabetes Sister     Prior to Admission medications   Medication Sig Start Date End Date Taking? Authorizing Provider  amiodarone (PACERONE) 200 MG tablet One tablet twice a day for one week then one tablet daily Patient taking differently: Take 200 mg by mouth daily. Half a tablet daily 07/06/17  Loletha Grayer, MD  atorvastatin (LIPITOR) 80 MG tablet Take 80 mg by mouth daily. 10/03/20   [provider]  ferrous sulfate (FERROUSUL) 325 (65 FE) MG tablet Take 1 tablet twice a day every other day for 90 days. 12/18/20   [name], MD  folic acid (FOLVITE) 1 MG tablet Take 1 mg by mouth every morning.    [provider]  glyBURIDE (DIABETA) 2.5 MG tablet Take 2.5 mg by mouth daily.    [provider]  levothyroxine (SYNTHROID) 50 MCG tablet Take 50 mcg by mouth daily before breakfast. 07/13/21   [provider]  [facility] ULTRA test strip daily. 04/21/21   [provider]  pantoprazole  (PROTONIX) 20 MG tablet Take 20 mg by mouth daily.    [provider]  polyethylene glycol (MIRALAX / GLYCOLAX) packet Take 17 g by mouth daily as needed. 07/06/17   Loletha Grayer, MD  sacubitril-valsartan (ENTRESTO) 24-26 MG Take 1 tablet by mouth 2 (two) times daily. 07/06/17   Loletha Grayer, MD  spironolactone (ALDACTONE) 25 MG tablet Take 0.5 tablets (12.5 mg total) by mouth daily. 07/06/17   Loletha Grayer, MD  torsemide (DEMADEX) 20 MG tablet Take 1 tablet (20 mg total) by mouth daily. 07/28/21   Shelly Coss, MD  Vericiguat 5 MG TABS Take 5 mg by mouth 2 (two) times daily.    [provider]    Physical Exam: Vitals:   09/14/21 2032  BP: (!) 121/46  Pulse: 75  Resp: (!) 25  SpO2: 97%  Weight: 56.2 kg  Height: 5\' 4"  (1.626 m)   Physical Exam Vitals (pale appearing.) and nursing note reviewed.  Constitutional:      General: He is not in acute distress.    Appearance: He is ill-appearing. He is not toxic-appearing or diaphoretic.  HENT:     Head: Normocephalic and atraumatic.     Right Ear: External ear normal.     Left Ear: External ear normal.     Nose: Nose normal.     Mouth/Throat:     Mouth: Mucous membranes are moist.  Eyes:     Extraocular Movements: Extraocular movements intact.     Pupils: Pupils are equal, round, and reactive to light.  Neck:     Vascular: No carotid bruit.  Cardiovascular:     Rate and Rhythm: Regular rhythm. Bradycardia present.     Pulses: Decreased pulses.          Dorsalis pedis pulses are 1+ on the right side and 1+ on the left side.       Posterior tibial pulses are 1+ on the right side and 1+ on the left side.     Heart sounds: Normal heart sounds.  Pulmonary:     Effort: Pulmonary effort is normal.     Breath sounds: Normal breath sounds.  Abdominal:     General: Bowel sounds are normal. There is no distension.     Palpations: Abdomen is soft. There is no mass.     Tenderness: There is no abdominal  tenderness. There is no guarding.     Hernia: No hernia is present.  Musculoskeletal:     Right lower leg: No edema.     Left lower leg: No edema.  Neurological:     Mental Status: He is alert.  Psychiatric:        Mood and Affect: Mood normal.        Behavior: Behavior normal.     Labs  on Admission: I have personally reviewed following labs and imaging studies  No results for input(s): CKTOTAL, CKMB, TROPONINI in the last 72 hours. Lab Results  Component Value Date   WBC 7.4 09/14/2021   HGB 6.5 (L) 09/14/2021   HCT 20.5 (L) 09/14/2021   MCV 95.8 09/14/2021   PLT 214 09/14/2021    Recent Labs  Lab 09/14/21 2038  NA 132*  K 5.7*  CL 105  CO2 18*  BUN 56*  CREATININE 2.41*  CALCIUM 9.3  PROT 7.0  BILITOT 0.9  ALKPHOS 68  ALT 22  AST 34  GLUCOSE 52*   No results found for: CHOL, HDL, LDLCALC, TRIG Lab Results  Component Value Date   DDIMER 3.38 (H) 07/24/2021   Invalid input(s): POCBNP   COVID-19 Labs No results for input(s): DDIMER, FERRITIN, LDH, CRP in the last 72 hours. Lab Results  Component Value Date   SARSCOV2NAA NEGATIVE 09/14/2021   [place] NEGATIVE 07/24/2021    Radiological Exams on Admission: DG Chest 2 View  Result Date: 09/14/2021 CLINICAL DATA:  Shortness of breath, chest pain, weakness, and nausea for 3 days. EXAM: CHEST - 2 VIEW COMPARISON:  07/24/2021 FINDINGS: Postoperative changes in the mediastinum. Cardiac pacemaker. Mild cardiac enlargement. No vascular congestion. There is evidence of peribronchial thickening with central interstitial pattern to the lungs probably representing bronchiectasis and chronic bronchitis. No developing consolidation or edema. Minimal fluid or thickened pleura along the left costophrenic angle is unchanged since prior study. No pneumothorax. Calcification of the aorta. IMPRESSION: Chronic bronchitic changes in the lungs. Mild fluid or thickened pleura in the left costophrenic angle. No developing  consolidation or edema. Electronically Signed   By: Lucienne Capers M.D.   On: 09/14/2021 21:40    EKG: Independently reviewed.  Ventricular paced  61   Assessment/Plan: Principal Problem:   Generalized weakness Active Problems:   IDA (iron deficiency anemia)   Melena   SOB (shortness of breath)   Acute on chronic systolic CHF (congestive heart failure) (HCC)   Atrial fibrillation, chronic (HCC)   CAD (coronary artery disease)   HLD (hyperlipidemia)   Chest pain   Acute renal failure superimposed on stage 3a chronic kidney disease (HCC)   Diabetes (HCC)   Hypoglycemia   HTN (hypertension)   Hypothyroid   Generalized weakness: Attribute to his  GIB/ CHF. We will evaluate and treat the underlying cause and provide supportive care in interim.   IDA/Melena: Type and cross and transfuse. IV PPI drip. Npo after midnight.  GI consult per am team/ pt has had egd/ colonoscopy and pill capsule egd this year.He sees dr. Vicente Males. Will hold anticoagulation with xarelto.  H/h q 6 h x 2 after transfusion.  12/16/20 14:23 03/11/21 10:17 07/24/21 10:11 07/24/21 16:45 07/25/21 06:47 07/25/21 19:00 07/26/21 05:22 07/26/21 15:34 07/27/21 04:55 07/28/21 04:48 08/04/21 12:55 08/07/21 09:01 09/14/21 20:38  Hemoglobin 9.6 (L) 10.2 (L) 7.9 (L) 7.3 (L) 7.6 (L) 8.7 (L) 7.4 (L) 7.9 (L) 7.2 (L) 8.9 (L) 9.3 (L) 10.2 (L) 6.5 (L)   SOB/ Acute on c / h Systolic and diastolic CHF: BNP: 20/94/70 10:11 09/14/21 20:38  3,305.3 (H) 2,575.8 (H)  Strict I/O, daily weights.cont amiodarone, aldactone.  Cautious diuresis with PRN lasix, in light of blood transfusion.  Will admit to stepdown. Telemetry. Entresto held due to [place].  CAD/ MI/ AICD /Chest pain/ HLD: TNI of 19 x 2. Continue atorvastatin, hydralazine. PRN NTG.  A/C on C/H kidney disease/ hyperkalemia: Lab Results  Component Value  Date   CREATININE 2.41 (H) 09/14/2021   CREATININE 1.70 (H) 08/07/2021   CREATININE 1.56 (H) 08/04/2021  -avoid  contrast studies,  -Renally dose meds.  -nephrology consult.  -cautious diuresis. - veltassa given in ed.    DM II/ Hypoglycemia: 07/24/21 17:53 07/24/21 23:16 07/25/21 08:30 07/25/21 13:00 07/25/21 17:20 07/25/21 21:16 07/26/21 08:00 07/26/21 11:26 07/26/21 16:06 07/26/21 21:11 07/27/21 08:03 07/27/21 11:02 07/27/21 16:26 07/27/21 20:53 07/28/21 07:42 07/28/21 11:51 08/07/21 08:24 08/07/21 11:23 09/14/21 20:43 09/14/21 21:45 09/14/21 23:43  85 82 66 (L) 254 (H) 133 (H) 125 (H) 90 209 (H) 164 (H) 178 (H) 108 (H) 170 (H) 125 (H) 192 (H) 99 206 (H) 70 83 42 (LL) 43 (LL) 70  Hold all home po meds for dm and start Glycemic protocol.    HTN: Blood pressure (!) 121/46, pulse 75, resp. rate (!) 25, height 5\' 4"  (1.626 m), weight 56.2 kg, SpO2 97 %. Pt continued on aldactone, entresto and torsemide held.  PRN hydralazine.   Hypothyroidism: Cont levothyroxine.   DVT prophylaxis:  SCD's   Code Status:  Full code    Family Communication:  Hilliard Clark (Brother)  [phone] (Mobile)   Disposition Plan:  Home    Consults: AM message sent  Dr. Juleen China: AKI on CKD- entresto held.  Dr. Bonna Gains : GIB/ Acute blood loss anemia- xarelto held.  Dr. Neoma Laming : chest pain/ cad/ aicd/ CHF getting blood with current bnp of over 2575s and sob/ doe/ edema/ - torsemide held.   Admission status: Inpatient.     Para Skeans MD [facility] [phone] How to contact the [facility] Attending or Consulting provider [place] or covering provider during after hours [place], for this patient.    Check the care team in [facility] and look for a) attending/consulting [facility] provider listed and b) the [facility] team listed Log into [email or web address] and use [place]'s universal password to access. If you do not have the password, please contact the hospital operator. Locate the [facility] provider you are looking for under [facility] and page to a number that you can be directly reached. If you still have  difficulty reaching the provider, please page the [facility] (Director on Call) for the Hospitalists listed on amion for assistance. [email or web address] Password [email or web address] 09/15/2021, 12:20 AM

## 2021-09-14 NOTE — ED Provider Notes (Signed)
[facility] Emergency Department Provider Note  Time seen: 10:26 PM  I have reviewed the triage vital signs and the nursing notes.   HISTORY  Chief Complaint Weakness and Hypoglycemia   HPI Walter Blair is a 75 y.o. male with a past medical history of atrial fibrillation on Xarelto, CAD, CKD, CHF, hypertension, hyperlipidemia, diabetes, presents to the emergency department for generalized weakness intermittent chest pain and nausea over the past 3 days.  According to patient family over the past 3 days he has been nauseated unable to eat, states he has been very weak and fatigued more somnolent than normal.  Has appeared to be short of breath at times as well per family.  Upon arrival patient noted to be hypoglycemic initially in the 60s on last check 43.  Patient has been able to eat very little here.  Patient states intermittent mild chest discomfort which he states feels like gas is somewhat relieved with belching.  Has been using simethicone at home without relief.  He is also noted dark stool over the past week or 2 and has stopped taking his iron supplement for an unknown reason.   Past Medical History:  Diagnosis Date   AICD (automatic cardioverter/defibrillator) present    Aortic atherosclerosis ([place])    Atrial fibrillation (HCC)    a.) CHA2DS2-VASc = 7 (age x 2, CHF, CVA x 2, aortic plaque, T2DM). b.) rate/rhythm maintained on oral amiodarone; chronically anticoagulated on rivaroxaban. 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 b.) TTE  07/25/2021: EF 20-25%; global HK; G3DD; severely reduced RV function; Severe BAR; mod-severe MR, severe TR, mild AR.   HLD (hyperlipidemia)    HTN (hypertension)    Hypothyroidism    IDA (iron deficiency anemia)    Ischemic cardiomyopathy    a.) TTE 06/29/2017: EF 10-15%. b.) TTE 07/25/2021: ED 20-25%.   Left-sided cerebrovascular accident (CVA) ([place]) 2004   a.) residual weakness in LEFT hand   Long term current use of anticoagulant    a.) rivaroxaban   S/P CABG x 4 2003   Performed in [place]   T2DM (type 2 diabetes mellitus) ([place])    Thrombocytopenia ([place])    possibly associated with underlying DIC    Patient Active Problem List   Diagnosis Date Noted   Pancytopenia ([place]) 07/30/2021   Acute on chronic systolic CHF (congestive heart failure) ([place]) 07/24/2021   Atrial fibrillation, chronic ([place]) 07/24/2021   H/O: stroke    Hematuria    Acute renal failure superimposed on stage 3a chronic kidney disease (HCC)    Thrombocytopenia (HCC)    HLD (hyperlipidemia)    Type II diabetes mellitus with renal manifestations ([place])    Hypothyroid    Iron deficiency anemia 01/27/2021   Mixed hyperlipidemia 02/06/2020   Grief 07/04/2017   Dizziness    SOB (shortness of breath)    Encounter for central line placement    New onset atrial fibrillation ([place]) 06/27/2017   Diabetes ([place]) 06/27/2017   HTN (hypertension) 06/27/2017  CAD (coronary artery disease) 06/27/2017    Past Surgical History:  Procedure Laterality Date   BLADDER INSTILLATION N/A 08/07/2021   Procedure: BLADDER INSTILLATION OF GEMCITABINE;  Surgeon: Billey Co, MD;  Location: [facility] ORS;  Service: Urology;  Laterality: N/A;   CARDIAC DEFIBRILLATOR PLACEMENT     CARDIOVERSION N/A 07/01/2017   Procedure: CARDIOVERSION;  Surgeon: Dionisio David, MD;  Location: [facility] ORS;  Service: Cardiovascular;  Laterality: N/A;   CARDIOVERSION N/A 07/05/2017   Procedure: CARDIOVERSION;  Surgeon: Dionisio David, MD;   Location: [facility] ORS;  Service: Cardiovascular;  Laterality: N/A;   COLONOSCOPY WITH PROPOFOL N/A 12/30/2020   Procedure: COLONOSCOPY WITH PROPOFOL;  Surgeon: [name], MD;  Location: [facility] ENDOSCOPY;  Service: Gastroenterology;  Laterality: N/A;   CORONARY ARTERY BYPASS GRAFT N/A 2003   Procedure: Four-vessel CABG performed in [place] Bilateral 08/07/2021   Procedure: CYSTOSCOPY WITH RETROGRADE PYELOGRAM;  Surgeon: Billey Co, MD;  Location: [facility] ORS;  Service: Urology;  Laterality: Bilateral;   ESOPHAGOGASTRODUODENOSCOPY  12/30/2020   Procedure: ESOPHAGOGASTRODUODENOSCOPY (EGD);  Surgeon: [name], MD;  Location: [facility] ENDOSCOPY;  Service: Gastroenterology;;   GIVENS CAPSULE STUDY N/A 02/02/2021   Procedure: GIVENS CAPSULE STUDY;  Surgeon: [name], MD;  Location: [facility] ENDOSCOPY;  Service: Gastroenterology;  Laterality: N/A;  WILL NEED INTERPRETER ON WHEELS;  BROTHER WANTS TO TRANSLATE   TEE WITHOUT CARDIOVERSION N/A 07/01/2017   Procedure: TRANSESOPHAGEAL ECHOCARDIOGRAM (TEE);  Surgeon: Dionisio David, MD;  Location: [facility] ORS;  Service: Cardiovascular;  Laterality: N/A;   TEE WITHOUT CARDIOVERSION N/A 07/05/2017   Procedure: TRANSESOPHAGEAL ECHOCARDIOGRAM (TEE);  Surgeon: Dionisio David, MD;  Location: [facility] ORS;  Service: Cardiovascular;  Laterality: N/A;   TRANSURETHRAL RESECTION OF BLADDER TUMOR N/A 08/07/2021   Procedure: TRANSURETHRAL RESECTION OF BLADDER TUMOR (TURBT);  Surgeon: Billey Co, MD;  Location: [facility] ORS;  Service: Urology;  Laterality: N/A;   URETEROSCOPY Left 08/07/2021   Procedure: DIAGNOSTIC URETEROSCOPY;  Surgeon: Billey Co, MD;  Location: [facility] ORS;  Service: Urology;  Laterality: Left;    Prior to Admission medications   Medication Sig Start Date End Date Taking? Authorizing Provider  amiodarone (PACERONE) 200 MG tablet One tablet twice a day for one week then one tablet daily Patient taking differently: Take  200 mg by mouth daily. Half a tablet daily 07/06/17   Loletha Grayer, MD  atorvastatin (LIPITOR) 80 MG tablet Take 80 mg by mouth daily. 10/03/20   [provider]  ferrous sulfate (FERROUSUL) 325 (65 FE) MG tablet Take 1 tablet twice a day every other day for 90 days. 12/18/20   [name], MD  folic acid (FOLVITE) 1 MG tablet Take 1 mg by mouth every morning.    [provider]  glyBURIDE (DIABETA) 2.5 MG tablet Take 2.5 mg by mouth daily.    [provider]  levothyroxine (SYNTHROID) 50 MCG tablet Take 50 mcg by mouth daily before breakfast. 07/13/21   [provider]  [facility] ULTRA test strip daily. 04/21/21   [provider]  pantoprazole (PROTONIX) 20 MG tablet Take 20 mg by mouth daily.    [provider]  polyethylene glycol (MIRALAX / GLYCOLAX) packet Take 17 g by mouth daily as needed. 07/06/17   Loletha Grayer, MD  sacubitril-valsartan (ENTRESTO) 24-26 MG Take 1 tablet by mouth 2 (two) times daily. 07/06/17   Loletha Grayer, MD  spironolactone (ALDACTONE) 25 MG tablet Take 0.5 tablets (12.5 mg total) by mouth  daily. 07/06/17   Loletha Grayer, MD  torsemide (DEMADEX) 20 MG tablet Take 1 tablet (20 mg total) by mouth daily. 07/28/21   Shelly Coss, MD  Vericiguat 5 MG TABS Take 5 mg by mouth 2 (two) times daily.    [provider]    Allergies  Allergen Reactions   Lorazepam Shortness Of Breath and Other (See Comments)    Pt experienced adverse reaction and was transferred to ICU last time they were given Med Other reaction(s): Other (See Comments) Pt experienced adverse reaction and was transferred to ICU last time they were given Med    Family History  Problem Relation Age of Onset   Diabetes Brother    Hypertension Mother    Diabetes Sister     Social History Social History   Tobacco Use   Smoking status: Former    Types: Cigarettes   Smokeless tobacco: Former    Types: Chew  Substance Use Topics    Alcohol use: No   Drug use: No    Review of Systems Constitutional: Negative for fever.  Positive generalized weakness fatigue Cardiovascular: Mild intermittent central chest pain. Respiratory: Negative for shortness of breath. Gastrointestinal: Negative for abdominal pain, vomiting  Musculoskeletal: Negative for musculoskeletal complaints Neurological: Negative for headache All other ROS negative  ____________________________________________   PHYSICAL EXAM:  VITAL SIGNS: ED Triage Vitals [09/14/21 2032]  Enc Vitals Group     BP (!) 121/46     Pulse Rate 75     Resp (!) 25     Temp      Temp src      SpO2 97 %     Weight 123 lb 14.4 oz (56.2 kg)     Height 5\' 4"  (1.626 m)     Head Circumference      Peak Flow      Pain Score      Pain Loc      Pain Edu?      Excl. in [place]?    Constitutional: Alert and oriented.  Appears frail, but no acute distress. Eyes: Normal exam ENT      Head: Normocephalic and atraumatic.      Mouth/Throat: Mucous membranes are moist. Cardiovascular: Normal rate, regular rhythm.  Respiratory: Normal respiratory effort without tachypnea nor retractions. Breath sounds are clear  Gastrointestinal: Soft and nontender. No distention.  Rectal examination shows nontender, dark brown stool but guaiac positive. Musculoskeletal: Nontender with normal range of motion  Neurologic:  Normal speech and language. No gross focal neurologic deficits  Skin:  Skin is warm, dry and intact.  Psychiatric: Mood and affect are normal.   ____________________________________________    EKG  EKG viewed and interpreted by myself shows a junctional rhythm at 61 bpm with a widened QRS, left axis deviation, largely normal intervals with nonspecific ST changes.  Lateral T wave inversions.  Morphology is largely unchanged from last EKG 07/28/2021 however the lateral T wave inversions appear new.  ____________________________________________    RADIOLOGY  Chest x-ray  shows chronic bronchitic changes.  No acute abnormality.  ____________________________________________   INITIAL IMPRESSION / ASSESSMENT AND PLAN / ED COURSE  Pertinent labs & imaging results that were available during my care of the patient were reviewed by me and considered in my medical decision making (see chart for details).   Patient presents to the emergency department with complaints of generalized fatigue weakness intermittent chest pain dark stool.  Patient found to be hypoglycemic currently 43 in the emergency department.  We will dose half an amp of dextrose and start the patient on D5.  Patient's potassium noted to be elevated 5.7 we will bolus 500 cc of normal saline and dose 16.8 g of [place].  EKG morphology is largely unchanged from prior we will hold off on calcium administration.  Rectal examination does show dark brown stool guaiac positive.  Hemoglobin has resulted at 6.5, consistent with GI bleed.  Patient is on Xarelto.  We will hold Xarelto.  I have ordered 1 unit of packed red blood cells for the patient.  Patient's son agreeable to this plan of care.  Patient will be admitted to the hospital service for further work-up and treatment.  Patient agreeable to  Walter Blair was evaluated in Emergency Department on 09/14/2021 for the symptoms described in the history of present illness. He was evaluated in the context of the global COVID-19 pandemic, which necessitated consideration that the patient might be at risk for infection with the SARS-CoV-2 virus that causes COVID-19. Institutional protocols and algorithms that pertain to the evaluation of patients at risk for COVID-19 are in a state of rapid change based on information released by regulatory bodies including the CDC and federal and state organizations. These policies and algorithms were followed during the patient's care in the ED.  CRITICAL CARE Performed by: Harvest Dark   Total critical care time: 45  minutes  Critical care time was exclusive of separately billable procedures and treating other patients.  Critical care was necessary to treat or prevent imminent or life-threatening deterioration.  Critical care was time spent personally by me on the following activities: development of treatment plan with patient and/or surrogate as well as nursing, discussions with consultants, evaluation of patient's response to treatment, examination of patient, obtaining history from patient or surrogate, ordering and performing treatments and interventions, ordering and review of laboratory studies, ordering and review of radiographic studies, pulse oximetry and re-evaluation of patient's condition.  ____________________________________________   FINAL CLINICAL IMPRESSION(S) / ED DIAGNOSES  Hyperkalemia Weakness Acute renal insufficiency GI bleed Symptomatic anemia    Harvest Dark, MD 09/14/21 2238

## 2021-09-14 NOTE — ED Notes (Signed)
Pt given apple juice and a sandwich tray to eat. Will reassess CBG after pt is able to eat and drink.

## 2021-09-15 ENCOUNTER — Inpatient Hospital Stay
Admit: 2021-09-15 | Discharge: 2021-09-15 | Disposition: A | Discharge status: Home / Self Care | Payer: Medicare Other | Attending: Internal Medicine | Admitting: Internal Medicine

## 2021-09-15 DIAGNOSIS — R079 Chest pain, unspecified: Secondary | ICD-10-CM | POA: Diagnosis present

## 2021-09-15 DIAGNOSIS — D509 Iron deficiency anemia, unspecified: Secondary | ICD-10-CM

## 2021-09-15 DIAGNOSIS — R531 Weakness: Secondary | ICD-10-CM | POA: Diagnosis not present

## 2021-09-15 DIAGNOSIS — D62 Acute posthemorrhagic anemia: Secondary | ICD-10-CM

## 2021-09-15 DIAGNOSIS — K921 Melena: Secondary | ICD-10-CM | POA: Diagnosis not present

## 2021-09-15 DIAGNOSIS — E162 Hypoglycemia, unspecified: Secondary | ICD-10-CM | POA: Diagnosis present

## 2021-09-15 LAB — D-DIMER, QUANTITATIVE: D-Dimer, Quant: 0.36 ug/mL-FEU (ref 0.00–0.50)

## 2021-09-15 LAB — ECHOCARDIOGRAM COMPLETE
AR max vel: 1.4 cm2
AV Area VTI: 1.81 cm2
AV Area mean vel: 1.5 cm2
AV Mean grad: 2 mmHg
AV Peak grad: 3.7 mmHg
Ao pk vel: 0.97 m/s
Area-P 1/2: 6.27 cm2
Calc EF: 25.1 %
Height: 64 in
MV VTI: 1.53 cm2
S' Lateral: 4.9 cm
Single Plane A2C EF: 26.1 %
Single Plane A4C EF: 23.8 %
Weight: 1982.38 oz

## 2021-09-15 LAB — URINALYSIS, MICROSCOPIC (REFLEX): RBC / HPF: NONE SEEN RBC/hpf (ref 0–5)

## 2021-09-15 LAB — BASIC METABOLIC PANEL
Anion gap: 6 (ref 5–15)
BUN: 50 mg/dL — ABNORMAL HIGH (ref 8–23)
CO2: 16 mmol/L — ABNORMAL LOW (ref 22–32)
Calcium: 8.1 mg/dL — ABNORMAL LOW (ref 8.9–10.3)
Chloride: 109 mmol/L (ref 98–111)
Creatinine, Ser: 2.23 mg/dL — ABNORMAL HIGH (ref 0.61–1.24)
GFR, Estimated: 30 mL/min — ABNORMAL LOW (ref >60)
Glucose, Bld: 56 mg/dL — ABNORMAL LOW (ref 70–99)
Potassium: 5.3 mmol/L — ABNORMAL HIGH (ref 3.5–5.1)
Sodium: 131 mmol/L — ABNORMAL LOW (ref 135–145)

## 2021-09-15 LAB — CBG MONITORING, ED
Glucose-Capillary: 52 mg/dL — ABNORMAL LOW (ref 70–99)
Glucose-Capillary: 76 mg/dL (ref 70–99)
Glucose-Capillary: 63 mg/dL — ABNORMAL LOW (ref 70–99)
Glucose-Capillary: 88 mg/dL (ref 70–99)
Glucose-Capillary: 87 mg/dL (ref 70–99)

## 2021-09-15 LAB — PROTIME-INR
INR: 2.1 — ABNORMAL HIGH (ref 0.8–1.2)
Prothrombin Time: 23.9 seconds — ABNORMAL HIGH (ref 11.4–15.2)

## 2021-09-15 LAB — IRON AND TIBC
Iron: 42 ug/dL — ABNORMAL LOW (ref 45–182)
Saturation Ratios: 14 % — ABNORMAL LOW (ref 17.9–39.5)
TIBC: 301 ug/dL (ref 250–450)
UIBC: 259 ug/dL

## 2021-09-15 LAB — URINALYSIS, ROUTINE W REFLEX MICROSCOPIC
Bilirubin Urine: NEGATIVE
Glucose, UA: NEGATIVE mg/dL
Hgb urine dipstick: NEGATIVE
Ketones, ur: NEGATIVE mg/dL
Nitrite: NEGATIVE
Protein, ur: 30 mg/dL — AB
Specific Gravity, Urine: 1.015 (ref 1.005–1.030)
pH: 5 (ref 5.0–8.0)

## 2021-09-15 LAB — CBC
HCT: 21.7 % — ABNORMAL LOW (ref 39.0–52.0)
Hemoglobin: 7 g/dL — ABNORMAL LOW (ref 13.0–17.0)
MCH: 29.2 pg (ref 26.0–34.0)
MCHC: 32.3 g/dL (ref 30.0–36.0)
MCV: 90.4 fL (ref 80.0–100.0)
Platelets: 132 10*3/uL — ABNORMAL LOW (ref 150–400)
RBC: 2.4 MIL/uL — ABNORMAL LOW (ref 4.22–5.81)
RDW: 17.3 % — ABNORMAL HIGH (ref 11.5–15.5)
WBC: 4.7 10*3/uL (ref 4.0–10.5)
nRBC: 0 % (ref 0.0–0.2)

## 2021-09-15 LAB — APTT: aPTT: 58 seconds — ABNORMAL HIGH (ref 24–36)

## 2021-09-15 LAB — VITAMIN B12: Vitamin B-12: 214 pg/mL (ref 180–914)

## 2021-09-15 LAB — LACTATE DEHYDROGENASE: LDH: 191 U/L (ref 98–192)

## 2021-09-15 LAB — DAT, POLYSPECIFIC AHG (ARMC ONLY): Polyspecific AHG test: NEGATIVE

## 2021-09-15 LAB — PREPARE RBC (CROSSMATCH)

## 2021-09-15 LAB — HEMOGLOBIN AND HEMATOCRIT, BLOOD
HCT: 25.6 % — ABNORMAL LOW (ref 39.0–52.0)
Hemoglobin: 8.2 g/dL — ABNORMAL LOW (ref 13.0–17.0)

## 2021-09-15 LAB — FIBRINOGEN: Fibrinogen: 410 mg/dL (ref 210–475)

## 2021-09-15 LAB — MRSA NEXT GEN BY PCR, NASAL: MRSA by PCR Next Gen: NOT DETECTED

## 2021-09-15 LAB — GLUCOSE, CAPILLARY: Glucose-Capillary: 95 mg/dL (ref 70–99)

## 2021-09-15 MED ORDER — CHLORHEXIDINE GLUCONATE CLOTH 2 % EX PADS
6.0000 | MEDICATED_PAD | Freq: Every day | CUTANEOUS | Status: DC
  Administered 2021-09-15 – 2021-09-19 (×3): 6 via TOPICAL

## 2021-09-15 MED ORDER — INSULIN ASPART 100 UNIT/ML IJ SOLN: 0.0000 [IU] | Freq: Three times a day (TID) | INTRAMUSCULAR | Status: DC

## 2021-09-15 MED ORDER — PANTOPRAZOLE INFUSION (NEW) - SIMPLE MED
8.0000 mg/h | INTRAVENOUS | Status: DC
  Administered 2021-09-15 – 2021-09-17 (×5): 8 mg/h via INTRAVENOUS
  Filled 2021-09-15: qty 80
  Filled 2021-09-15 (×4): qty 100

## 2021-09-15 MED ORDER — NITROGLYCERIN 0.4 MG SL SUBL: 0.4000 mg | SUBLINGUAL_TABLET | SUBLINGUAL | Status: DC | PRN

## 2021-09-15 MED ORDER — ACETAMINOPHEN 325 MG PO TABS: 650.0000 mg | ORAL_TABLET | Freq: Once | ORAL | Status: DC

## 2021-09-15 MED ORDER — PNEUMOCOCCAL VAC POLYVALENT 25 MCG/0.5ML IJ INJ
0.5000 mL | INJECTION | INTRAMUSCULAR | Status: DC
  Filled 2021-09-15: qty 0.5

## 2021-09-15 MED ORDER — INSULIN ASPART 100 UNIT/ML IJ SOLN
0.0000 [IU] | INTRAMUSCULAR | Status: DC
  Administered 2021-09-16: 16:00:00 2 [IU] via SUBCUTANEOUS
  Administered 2021-09-16: 20:00:00 1 [IU] via SUBCUTANEOUS
  Administered 2021-09-16: 05:00:00 0 [IU] via SUBCUTANEOUS
  Administered 2021-09-17: 17:00:00 5 [IU] via SUBCUTANEOUS
  Administered 2021-09-17: 20:00:00 2 [IU] via SUBCUTANEOUS
  Administered 2021-09-18: 12:00:00 1 [IU] via SUBCUTANEOUS
  Filled 2021-09-15 (×6): qty 1

## 2021-09-15 MED ORDER — SODIUM CHLORIDE 0.9% IV SOLUTION
Freq: Once | INTRAVENOUS | Status: AC
  Filled 2021-09-15: qty 250

## 2021-09-15 MED ORDER — PANTOPRAZOLE 80MG IVPB - SIMPLE MED
80.0000 mg | Freq: Once | INTRAVENOUS | Status: AC
  Administered 2021-09-15: 05:00:00 80 mg via INTRAVENOUS
  Filled 2021-09-15: qty 100

## 2021-09-15 NOTE — ED Notes (Signed)
Pt requesting something to eat.  Per Hospitalist, Pt needs to remain NPO until seen by GI.

## 2021-09-15 NOTE — TOC Initial Note (Signed)
Transition of Care [facility]) - Initial/Assessment Note    Patient Details  Name: Walter Blair MRN: [id number] Date of Birth: [birth year]  [facility] CM/SW Contact:    Anselm Pancoast, RN Phone Number: 09/15/2021, 4:07 PM  Clinical Narrative:                 Spoke to Alexander active with nursing.         Patient Goals and CMS Choice        Expected Discharge Plan and Services                                                Prior Living Arrangements/Services                       Activities of Daily Living      Permission Sought/Granted                  Emotional Assessment              Admission diagnosis:  Generalized weakness [R53.1] Patient Active Problem List   Diagnosis Date Noted   Melena 09/15/2021   Chest pain 09/15/2021   Hypoglycemia 09/15/2021   Generalized weakness 09/14/2021   Pancytopenia ([place]) 07/30/2021   Acute on chronic systolic CHF (congestive heart failure) ([place]) 07/24/2021   Atrial fibrillation, chronic ([place]) 07/24/2021   H/O: stroke    Hematuria    Acute renal failure superimposed on stage 3a chronic kidney disease (HCC)    Thrombocytopenia (HCC)    HLD (hyperlipidemia)    Type II diabetes mellitus with renal manifestations (HCC)    Hypothyroid    IDA (iron deficiency anemia) 01/27/2021   Mixed hyperlipidemia 02/06/2020   Grief 07/04/2017   Dizziness    SOB (shortness of breath)    Encounter for central line placement    New onset atrial fibrillation ([place]) 06/27/2017   Diabetes ([place]) 06/27/2017   HTN (hypertension) 06/27/2017   CAD (coronary artery disease) 06/27/2017   PCP:  Jodi Marble, MD Pharmacy:   [id number] - [place], [place] [address] [place] [address] [place] [address] Phone: [phone] Fax: [phone]     Social Determinants of Health (SDOH) Interventions     Readmission Risk Interventions No flowsheet data found.

## 2021-09-15 NOTE — ED Notes (Signed)
Lab notified of additional lab work and will send someone to draw them.

## 2021-09-15 NOTE — Progress Notes (Signed)
[facility]  ROUNDING NOTE   Subjective:   [name] was admitted to [facility] on 09/14/2021 for Generalized weakness [R53.1]  Patient with GI bleed with black tarry stools for several days. Hemoglobin of 6.5 and received 1 unit PRBC last night.   Patient also with hypoglycemia.   Placed on dextrose gtt.    Objective:  Vital signs in last 24 hours:  Temp:  [97.3 F (36.3 C)-97.7 F (36.5 C)] 97.6 F (36.4 C) (12/27 1113) Pulse Rate:  [58-75] 60 (12/27 1230) Resp:  [13-25] 13 (12/27 1230) BP: (88-124)/(44-85) 104/56 (12/27 1230) SpO2:  [89 %-100 %] 98 % (12/27 1230) Weight:  [56.2 kg] 56.2 kg (12/26 2032)  Weight change:  Filed Weights   09/14/21 2032  Weight: 56.2 kg    Intake/Output: I/O last 3 completed shifts: In: 640 [Blood:640] Out: -    Intake/Output this shift:  Total I/O In: 310 [Blood:310] Out: -   Physical Exam: General: NAD,   Head: Normocephalic, atraumatic. Moist oral mucosal membranes  Eyes: Anicteric, PERRL  Neck: Supple, trachea midline  Lungs:  Clear to auscultation  Heart: Regular rate and rhythm  Abdomen:  Soft, nontender,   Extremities:  No peripheral edema.  Neurologic: Nonfocal, moving all four extremities  Skin: No lesions       Basic Metabolic Panel: Recent Labs  Lab 09/14/21 2038 09/15/21 0702  NA 132* 131*  K 5.7* 5.3*  CL 105 109  CO2 18* 16*  GLUCOSE 52* 56*  BUN 56* 50*  CREATININE 2.41* 2.23*  CALCIUM 9.3 8.1*    Liver Function Tests: Recent Labs  Lab 09/14/21 2038  AST 34  ALT 22  ALKPHOS 68  BILITOT 0.9  PROT 7.0  ALBUMIN 3.5   No results for input(s): LIPASE, AMYLASE in the last 168 hours. No results for input(s): AMMONIA in the last 168 hours.  CBC: Recent Labs  Lab 09/14/21 2038 09/15/21 0702  WBC 7.4 4.7  NEUTROABS 6.7  --   HGB 6.5* 7.0*  HCT 20.5* 21.7*  MCV 95.8 90.4  PLT 214 132*    Cardiac Enzymes: No results for input(s): CKTOTAL, CKMB, CKMBINDEX, TROPONINI in  the last 168 hours.  BNP: Invalid input(s): POCBNP  CBG: Recent Labs  Lab 09/14/21 2145 09/14/21 2343 09/15/21 0652 09/15/21 0809 09/15/21 1223  GLUCAP 43* 70 52* 76 4*    Microbiology: Results for orders placed or performed during the hospital encounter of 09/14/21  Resp Panel by RT-PCR (Flu A&B, Covid) Nasopharyngeal Swab     Status: None   Collection Time: 09/14/21  8:38 PM   Specimen: Nasopharyngeal Swab; Nasopharyngeal(NP) swabs in vial transport medium  Result Value Ref Range Status   SARS Coronavirus 2 by RT PCR NEGATIVE NEGATIVE Final    Comment: (NOTE) SARS-CoV-2 target nucleic acids are NOT DETECTED.  The SARS-CoV-2 RNA is generally detectable in upper respiratory specimens during the acute phase of infection. The lowest concentration of SARS-CoV-2 viral copies this assay can detect is 138 copies/mL. A negative result does not preclude SARS-Cov-2 infection and should not be used as the sole basis for treatment or other patient management decisions. A negative result may occur with  improper specimen collection/handling, submission of specimen other than nasopharyngeal swab, presence of viral mutation(s) within the areas targeted by this assay, and inadequate number of viral copies(<138 copies/mL). A negative result must be combined with clinical observations, patient history, and epidemiological information. The expected result is Negative.  Fact Sheet for Patients:  EntrepreneurPulse.com.au  Fact Sheet for Healthcare Providers:  IncredibleEmployment.be  This test is no t yet approved or cleared by the [place] FDA and  has been authorized for detection and/or diagnosis of SARS-CoV-2 by FDA under an Emergency Use Authorization (EUA). This EUA will remain  in effect (meaning this test can be used) for the duration of the COVID-19 declaration under Section 564(b)(1) of the Act, 21 U.S.C.section 360bbb-3(b)(1), unless  the authorization is terminated  or revoked sooner.       Influenza A by PCR NEGATIVE NEGATIVE Final   Influenza B by PCR NEGATIVE NEGATIVE Final    Comment: (NOTE) The Xpert Xpress SARS-CoV-2/FLU/RSV plus assay is intended as an aid in the diagnosis of influenza from Nasopharyngeal swab specimens and should not be used as a sole basis for treatment. Nasal washings and aspirates are unacceptable for Xpert Xpress SARS-CoV-2/FLU/RSV testing.  Fact Sheet for Patients: EntrepreneurPulse.com.au  Fact Sheet for Healthcare Providers: IncredibleEmployment.be  This test is not yet approved or cleared by the [place] FDA and has been authorized for detection and/or diagnosis of SARS-CoV-2 by FDA under an Emergency Use Authorization (EUA). This EUA will remain in effect (meaning this test can be used) for the duration of the COVID-19 declaration under Section 564(b)(1) of the Act, 21 U.S.C. section 360bbb-3(b)(1), unless the authorization is terminated or revoked.  Performed at [facility], [address] [place], [place] [address]     Coagulation Studies: No results for input(s): LABPROT, INR in the last 72 hours.  Urinalysis: Recent Labs    09/14/21 0727  COLORURINE YELLOW  LABSPEC 1.015  PHURINE 5.0  GLUCOSEU NEGATIVE  HGBUR NEGATIVE  BILIRUBINUR NEGATIVE  KETONESUR NEGATIVE  PROTEINUR 30*  NITRITE NEGATIVE  LEUKOCYTESUR SMALL*      Imaging: DG Chest 2 View  Result Date: 09/14/2021 CLINICAL DATA:  Shortness of breath, chest pain, weakness, and nausea for 3 days. EXAM: CHEST - 2 VIEW COMPARISON:  07/24/2021 FINDINGS: Postoperative changes in the mediastinum. Cardiac pacemaker. Mild cardiac enlargement. No vascular congestion. There is evidence of peribronchial thickening with central interstitial pattern to the lungs probably representing bronchiectasis and chronic bronchitis. No developing consolidation or edema.  Minimal fluid or thickened pleura along the left costophrenic angle is unchanged since prior study. No pneumothorax. Calcification of the aorta. IMPRESSION: Chronic bronchitic changes in the lungs. Mild fluid or thickened pleura in the left costophrenic angle. No developing consolidation or edema. Electronically Signed   By: Lucienne Capers M.D.   On: 09/14/2021 21:40     Medications:    dextrose 5 % and 0.9% NaCl 75 mL/hr at 09/15/21 0845   pantoprazole Stopped (09/15/21 1051)    acetaminophen  650 mg Oral Once   amiodarone  100 mg Oral Daily   atorvastatin  80 mg Oral Daily   docusate sodium  100 mg Oral BID   furosemide  40 mg Intravenous BID   insulin aspart  0-9 Units Subcutaneous Q4H   levothyroxine  88 mcg Oral Q0600   nicotine  14 mg Transdermal Daily   patiromer  16.8 g Oral Daily   sodium chloride flush  3 mL Intravenous Q12H   acetaminophen **OR** acetaminophen, bisacodyl, hydrALAZINE, morphine injection, nitroGLYCERIN, ondansetron **OR** ondansetron (ZOFRAN) IV, oxyCODONE, polyethylene glycol, traZODone  Assessment/ Plan:  [name] is a 75 y.o. [place] male with hypertension, diabetes mellitus type II, systolic congestive heart failure, coronary artery disease, who is admitted to [facility] on 09/14/2021 for Generalized weakness [R53.1]  Acute kidney injury with hyperkalemia and metabolic acidosis on chronic kidney disease stage IIIA with proteinuria: Baseline creatinine of 1.59, GFR of 45 on 08/06/21. 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 Acute kidney injury secondary to acute blood loss anemia - holding Entresto - monitor volume status closely on IV fluids  Hypertension with chronic kidney disease: holding entresto as above, patient holding spironolactone due to hyperkalemia.  - discontinue furosemide  Diabetes mellitus type II with chronic kidney disease: with hypoglycemia.  - do not recommend restarting  sulfonylurea.   Anemia with chronic kidney disease and acute blood loss anemia. Status post PRBC transfusion. With acute GI bleed. Unclear if patient was on proton pump inhibitor at home.    LOS: 1 Donel Osowski 12/27/20221:03 PM

## 2021-09-15 NOTE — Consult Note (Signed)
Walter Blair is a 75 y.o. male  [id number]  Primary Cardiologist: Neoma Laming Reason for Consultation: Shortness of breath/CHF  HPI: Is a 75 year old Walter Blair male with history of severe LV dysfunction with left ventricular ejection fraction on echocardiogram done today at 19 to 23% presented with hypoglycemia and shortness of breath along with severe anemia possibly due to GI bleed.  Patient has some epigastric pain but no chest pain.   Review of Systems: No chest pain   Past Medical History:  Diagnosis Date   AICD (automatic cardioverter/defibrillator) present    Aortic atherosclerosis (HCC)    Atrial fibrillation (HCC)    a.) CHA2DS2-VASc = 7 (age x 2, CHF, CVA x 2, aortic plaque, T2DM). b.) rate/rhythm maintained on oral amiodarone; chronically anticoagulated on rivaroxaban. 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 b.) TTE 07/25/2021: EF 20-25%; global HK; G3DD; severely reduced RV function; Severe BAR; mod-severe MR, severe TR, mild AR.   HLD (hyperlipidemia)    HTN (hypertension)    Hypothyroidism    IDA (iron deficiency anemia)    Ischemic cardiomyopathy    a.) TTE 06/29/2017: EF 10-15%. b.) TTE 07/25/2021: ED 20-25%.   Left-sided cerebrovascular accident (CVA) ([place]) 2004   a.) residual weakness in LEFT hand   Long term current use of anticoagulant    a.) rivaroxaban   S/P CABG x 4 2003   Performed in [place]   T2DM (type 2 diabetes mellitus) ([place])    Thrombocytopenia ([place])    possibly associated with underlying DIC    (Not in a  hospital admission)     acetaminophen  650 mg Oral Once   amiodarone  100 mg Oral Daily   atorvastatin  80 mg Oral Daily   docusate sodium  100 mg Oral BID   furosemide  40 mg Intravenous BID   insulin aspart  0-9 Units Subcutaneous Q4H   levothyroxine  88 mcg Oral Q0600   nicotine  14 mg Transdermal Daily   patiromer  16.8 g Oral Daily   sodium chloride flush  3 mL Intravenous Q12H    Infusions:  dextrose 5 % and 0.9% NaCl 75 mL/hr at 09/15/21 0845   pantoprazole Stopped (09/15/21 1051)    Allergies  Allergen Reactions   Lorazepam Shortness Of Breath and Other (See Comments)    Pt experienced adverse reaction and was transferred to ICU last time they were given Med Other reaction(s): Other (See Comments) Pt experienced adverse reaction and was transferred to ICU last time they were given Med    Social History   Socioeconomic History   Marital status: Widowed    Spouse name: Not on file   Number of children: Not on file   Years of education: Not on file   Highest education level: Not on file  Occupational History   Not on file  Tobacco Use   Smoking status: Former    Types: Cigarettes   Smokeless tobacco: Former    Types: Chew  Substance and Sexual Activity  Alcohol use: No   Drug use: No   Sexual activity: Not Currently  Other Topics Concern   Not on file  Social History Narrative   Lives with brother, POA   Social Determinants of Health   Financial Resource Strain: Not on file  Food Insecurity: Not on file  Transportation Needs: Not on file  Physical Activity: Not on file  Stress: Not on file  Social Connections: Not on file  Intimate Partner Violence: Not on file    Family History  Problem Relation Age of Onset   Diabetes Brother    Hypertension Mother    Diabetes Sister     PHYSICAL EXAM: Vitals:   09/15/21 1200 09/15/21 1230  BP: (!) 110/59 (!) 104/56  Pulse: (!) 59 60  Resp: 15 13  Temp:    SpO2: 98% 98%     Intake/Output  Summary (Last 24 hours) at 09/15/2021 1309 Last data filed at 09/15/2021 1103 Gross per 24 hour  Intake 950 ml  Output --  Net 950 ml    General:  Well appearing. No respiratory difficulty HEENT: normal Neck: supple. no JVD. Carotids 2+ bilat; no bruits. No lymphadenopathy or thryomegaly appreciated. Cor: PMI nondisplaced. Regular rate & rhythm. No rubs, gallops or murmurs. Lungs: clear Abdomen: soft, nontender, nondistended. No hepatosplenomegaly. No bruits or masses. Good bowel sounds. Extremities: no cyanosis, clubbing, rash, edema Neuro: alert & oriented x 3, cranial nerves grossly intact. moves all 4 extremities w/o difficulty. Affect pleasant.  ECG: VVI paced rhythm about 60 bpm  Results for orders placed or performed during the hospital encounter of 09/14/21 (from the past 24 hour(s))  CBC with Differential     Status: Abnormal   Collection Time: 09/14/21  8:38 PM  Result Value Ref Range   WBC 7.4 4.0 - 10.5 K/uL   RBC 2.14 (L) 4.22 - 5.81 MIL/uL   Hemoglobin 6.5 (L) 13.0 - 17.0 g/dL   HCT 20.5 (L) 39.0 - 52.0 %   MCV 95.8 80.0 - 100.0 fL   MCH 30.4 26.0 - 34.0 pg   MCHC 31.7 30.0 - 36.0 g/dL   RDW 17.3 (H) 11.5 - 15.5 %   Platelets 214 150 - 400 K/uL   nRBC 0.0 0.0 - 0.2 %   Neutrophils Relative % 91 %   Neutro Abs 6.7 1.7 - 7.7 K/uL   Lymphocytes Relative 4 %   Lymphs Abs 0.3 (L) 0.7 - 4.0 K/uL   Monocytes Relative 5 %   Monocytes Absolute 0.4 0.1 - 1.0 K/uL   Eosinophils Relative 0 %   Eosinophils Absolute 0.0 0.0 - 0.5 K/uL   Basophils Relative 0 %   Basophils Absolute 0.0 0.0 - 0.1 K/uL   Immature Granulocytes 0 %   Abs Immature Granulocytes 0.02 0.00 - 0.07 K/uL  Comprehensive metabolic panel     Status: Abnormal   Collection Time: 09/14/21  8:38 PM  Result Value Ref Range   Sodium 132 (L) 135 - 145 mmol/L   Potassium 5.7 (H) 3.5 - 5.1 mmol/L   Chloride 105 98 - 111 mmol/L   CO2 18 (L) 22 - 32 mmol/L   Glucose, Bld 52 (L) 70 - 99 mg/dL   BUN 56 (H) 8  - 23 mg/dL   Creatinine, Ser 2.41 (H) 0.61 - 1.24 mg/dL   Calcium 9.3 8.9 - 10.3 mg/dL   Total Protein 7.0 6.5 - 8.1 g/dL   Albumin 3.5 3.5 - 5.0 g/dL   AST 34 15 -  41 U/L   ALT 22 0 - 44 U/L   Alkaline Phosphatase 68 38 - 126 U/L   Total Bilirubin 0.9 0.3 - 1.2 mg/dL   GFR, Estimated 27 (L) >60 mL/min   Anion gap 9 5 - 15  Troponin I (High Sensitivity)     Status: Abnormal   Collection Time: 09/14/21  8:38 PM  Result Value Ref Range   Troponin I (High Sensitivity) 19 (H) <18 ng/L  Brain natriuretic peptide     Status: Abnormal   Collection Time: 09/14/21  8:38 PM  Result Value Ref Range   B Natriuretic Peptide 2,575.8 (H) 0.0 - 100.0 pg/mL  Resp Panel by RT-PCR (Flu A&B, Covid) Nasopharyngeal Swab     Status: None   Collection Time: 09/14/21  8:38 PM   Specimen: Nasopharyngeal Swab; Nasopharyngeal(NP) swabs in vial transport medium  Result Value Ref Range   SARS Coronavirus 2 by RT PCR NEGATIVE NEGATIVE   Influenza A by PCR NEGATIVE NEGATIVE   Influenza B by PCR NEGATIVE NEGATIVE  POC CBG, ED     Status: Abnormal   Collection Time: 09/14/21  8:43 PM  Result Value Ref Range   Glucose-Capillary 42 (LL) 70 - 99 mg/dL  CBG monitoring, ED     Status: Abnormal   Collection Time: 09/14/21  9:45 PM  Result Value Ref Range   Glucose-Capillary 43 (LL) 70 - 99 mg/dL  Troponin I (High Sensitivity)     Status: Abnormal   Collection Time: 09/14/21 10:59 PM  Result Value Ref Range   Troponin I (High Sensitivity) 19 (H) <18 ng/L  Type and screen [facility]     Status: None (Preliminary result)   Collection Time: 09/14/21 10:59 PM  Result Value Ref Range   ABO/RH(D) A POS    Antibody Screen NEG    Sample Expiration 09/17/2021,2359    Unit Number [id number]    Blood Component Type RED CELLS,LR    Unit division 00    Status of Unit ISSUED    Transfusion Status OK TO TRANSFUSE    Crossmatch Result Compatible    Unit Number [id number]    Blood Component Type  RED CELLS,LR    Unit division 00    Status of Unit ISSUED    Transfusion Status OK TO TRANSFUSE    Crossmatch Result      Compatible Performed at [facility], [address] [place], [place] [address]   Prepare RBC (crossmatch)     Status: None   Collection Time: 09/14/21 11:00 PM  Result Value Ref Range   Order Confirmation      ORDER PROCESSED BY BLOOD BANK Performed at [facility], [address] [place], [place] [address]   POC CBG, ED     Status: None   Collection Time: 09/14/21 11:43 PM  Result Value Ref Range   Glucose-Capillary 70 70 - 99 mg/dL  CBG monitoring, ED     Status: Abnormal   Collection Time: 09/15/21  6:52 AM  Result Value Ref Range   Glucose-Capillary 52 (L) 70 - 99 mg/dL  Basic metabolic panel     Status: Abnormal   Collection Time: 09/15/21  7:02 AM  Result Value Ref Range   Sodium 131 (L) 135 - 145 mmol/L   Potassium 5.3 (H) 3.5 - 5.1 mmol/L   Chloride 109 98 - 111 mmol/L   CO2 16 (L) 22 - 32 mmol/L   Glucose, Bld 56 (L) 70 - 99 mg/dL  BUN 50 (H) 8 - 23 mg/dL   Creatinine, Ser 2.23 (H) 0.61 - 1.24 mg/dL   Calcium 8.1 (L) 8.9 - 10.3 mg/dL   GFR, Estimated 30 (L) >60 mL/min   Anion gap 6 5 - 15  CBC     Status: Abnormal   Collection Time: 09/15/21  7:02 AM  Result Value Ref Range   WBC 4.7 4.0 - 10.5 K/uL   RBC 2.40 (L) 4.22 - 5.81 MIL/uL   Hemoglobin 7.0 (L) 13.0 - 17.0 g/dL   HCT 21.7 (L) 39.0 - 52.0 %   MCV 90.4 80.0 - 100.0 fL   MCH 29.2 26.0 - 34.0 pg   MCHC 32.3 30.0 - 36.0 g/dL   RDW 17.3 (H) 11.5 - 15.5 %   Platelets 132 (L) 150 - 400 K/uL   nRBC 0.0 0.0 - 0.2 %  CBG monitoring, ED     Status: None   Collection Time: 09/15/21  8:09 AM  Result Value Ref Range   Glucose-Capillary 76 70 - 99 mg/dL  Prepare RBC (crossmatch)     Status: None   Collection Time: 09/15/21  8:28 AM  Result Value Ref Range   Order Confirmation      ORDER PROCESSED BY BLOOD BANK Performed at [facility], [address], [place], [place] [address]   CBG monitoring, ED     Status: Abnormal   Collection Time: 09/15/21 12:23 PM  Result Value Ref Range   Glucose-Capillary 63 (L) 70 - 99 mg/dL   DG Chest 2 View  Result Date: 09/14/2021 CLINICAL DATA:  Shortness of breath, chest pain, weakness, and nausea for 3 days. EXAM: CHEST - 2 VIEW COMPARISON:  07/24/2021 FINDINGS: Postoperative changes in the mediastinum. Cardiac pacemaker. Mild cardiac enlargement. No vascular congestion. There is evidence of peribronchial thickening with central interstitial pattern to the lungs probably representing bronchiectasis and chronic bronchitis. No developing consolidation or edema. Minimal fluid or thickened pleura along the left costophrenic angle is unchanged since prior study. No pneumothorax. Calcification of the aorta. IMPRESSION: Chronic bronchitic changes in the lungs. Mild fluid or thickened pleura in the left costophrenic angle. No developing consolidation or edema. Electronically Signed   By: Lucienne Capers M.D.   On: 09/14/2021 21:40     ASSESSMENT AND PLAN: Congestive heart failure with BUN and creatinine much worse than before but but no decompensation of heart failure.  Hemoglobin was around 7 and is getting blood transfusion.  Patient has ejection fraction 19 to 23% with moderate to severe mitral regurgitation and four-chamber dilatation on echocardiogram done today.  Advise bringing the hemoglobin closer to 9-10.  Advise discontinuing Xarelto because patient cannot tolerate anticoagulants although does have history of atrial fibrillation and very high chads vascular score.  Explained the risk of stroke to the patient and he has agreed to not continue Xarelto from now on.  Patient upon discharge can be follow-up in the office next week  Sheri Prows A

## 2021-09-15 NOTE — Consult Note (Signed)
Walter Blair is a 75 y.o. male  [id number]  Primary Cardiologist: Neoma Laming Reason for Consultation: CHF  HPI: 75 year old Walter Blair male who presented to the hospital with possible GI bleed and hypoglycemia with underlying history of congestive heart failure with severe LV dysfunction.  Patient has some shortness of breath which has gotten worse over couple of days.   Review of Systems: No chest pain   Past Medical History:  Diagnosis Date   AICD (automatic cardioverter/defibrillator) present    Aortic atherosclerosis (HCC)    Atrial fibrillation (HCC)    a.) CHA2DS2-VASc = 7 (age x 2, CHF, CVA x 2, aortic plaque, T2DM). b.) rate/rhythm maintained on oral amiodarone; chronically anticoagulated on rivaroxaban. 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 b.) TTE 07/25/2021: EF 20-25%; global HK; G3DD; severely reduced RV function; Severe BAR; mod-severe MR, severe TR, mild AR.   HLD (hyperlipidemia)    HTN (hypertension)    Hypothyroidism    IDA (iron deficiency anemia)    Ischemic cardiomyopathy    a.) TTE 06/29/2017: EF 10-15%. b.) TTE 07/25/2021: ED 20-25%.   Left-sided cerebrovascular accident (CVA) (Walter Blair) 2004   a.) residual weakness in LEFT hand   Long term current use of anticoagulant    a.) rivaroxaban   S/P CABG x 4 2003   Performed in [name]   T2DM (type 2 diabetes mellitus) ([name])    Thrombocytopenia ([name])    possibly associated with underlying DIC    (Not in a hospital admission)     acetaminophen  650 mg Oral Once   amiodarone   100 mg Oral Daily   atorvastatin  80 mg Oral Daily   docusate sodium  100 mg Oral BID   furosemide  40 mg Intravenous BID   insulin aspart  0-9 Units Subcutaneous Q4H   levothyroxine  88 mcg Oral Q0600   nicotine  14 mg Transdermal Daily   patiromer  16.8 g Oral Daily   sodium chloride flush  3 mL Intravenous Q12H    Infusions:  dextrose 5 % and 0.9% NaCl 75 mL/hr at 09/15/21 0845   pantoprazole Stopped (09/15/21 1051)    Allergies  Allergen Reactions   Lorazepam Shortness Of Breath and Other (See Comments)    Pt experienced adverse reaction and was transferred to ICU last time they were given Med Other reaction(s): Other (See Comments) Pt experienced adverse reaction and was transferred to ICU last time they were given Med    Social History   Socioeconomic History   Marital status: Widowed    Spouse name: Not on file   Number of children: Not on file   Years of education: Not on file   Highest education level: Not on file  Occupational History   Not on file  Tobacco Use   Smoking status: Former    Types: Cigarettes   Smokeless tobacco: Former    Types: Chew  Substance and Sexual Activity   Alcohol use: No   Drug use: No   Sexual  activity: Not Currently  Other Topics Concern   Not on file  Social History Narrative   Lives with brother, POA   Social Determinants of Health   Financial Resource Strain: Not on file  Food Insecurity: Not on file  Transportation Needs: Not on file  Physical Activity: Not on file  Stress: Not on file  Social Connections: Not on file  Intimate Partner Violence: Not on file    Family History  Problem Relation Age of Onset   Diabetes Brother    Hypertension Mother    Diabetes Sister     PHYSICAL EXAM: Vitals:   09/15/21 1430 09/15/21 1445  BP: (!) 109/54 108/68  Pulse: (!) 59 (!) 58  Resp: 15 13  Temp:    SpO2: 100% 98%     Intake/Output Summary (Last 24 hours) at 09/15/2021 1500 Last data filed at 09/15/2021  1340 Gross per 24 hour  Intake 1260 ml  Output --  Net 1260 ml    General:  Well appearing. No respiratory difficulty HEENT: normal Neck: supple. no JVD. Carotids 2+ bilat; no bruits. No lymphadenopathy or thryomegaly appreciated. Cor: PMI nondisplaced. Regular rate & rhythm. No rubs, gallops or murmurs. Lungs: clear Abdomen: soft, nontender, nondistended. No hepatosplenomegaly. No bruits or masses. Good bowel sounds. Extremities: no cyanosis, clubbing, rash, edema Neuro: alert & oriented x 3, cranial nerves grossly intact. moves all 4 extremities w/o difficulty. Affect pleasant.  ECG: VVI 100% paced rhythm  Results for orders placed or performed during the hospital encounter of 09/14/21 (from the past 24 hour(s))  CBC with Differential     Status: Abnormal   Collection Time: 09/14/21  8:38 PM  Result Value Ref Range   WBC 7.4 4.0 - 10.5 K/uL   RBC 2.14 (L) 4.22 - 5.81 MIL/uL   Hemoglobin 6.5 (L) 13.0 - 17.0 g/dL   HCT 20.5 (L) 39.0 - 52.0 %   MCV 95.8 80.0 - 100.0 fL   MCH 30.4 26.0 - 34.0 pg   MCHC 31.7 30.0 - 36.0 g/dL   RDW 17.3 (H) 11.5 - 15.5 %   Platelets 214 150 - 400 K/uL   nRBC 0.0 0.0 - 0.2 %   Neutrophils Relative % 91 %   Neutro Abs 6.7 1.7 - 7.7 K/uL   Lymphocytes Relative 4 %   Lymphs Abs 0.3 (L) 0.7 - 4.0 K/uL   Monocytes Relative 5 %   Monocytes Absolute 0.4 0.1 - 1.0 K/uL   Eosinophils Relative 0 %   Eosinophils Absolute 0.0 0.0 - 0.5 K/uL   Basophils Relative 0 %   Basophils Absolute 0.0 0.0 - 0.1 K/uL   Immature Granulocytes 0 %   Abs Immature Granulocytes 0.02 0.00 - 0.07 K/uL  Comprehensive metabolic panel     Status: Abnormal   Collection Time: 09/14/21  8:38 PM  Result Value Ref Range   Sodium 132 (L) 135 - 145 mmol/L   Potassium 5.7 (H) 3.5 - 5.1 mmol/L   Chloride 105 98 - 111 mmol/L   CO2 18 (L) 22 - 32 mmol/L   Glucose, Bld 52 (L) 70 - 99 mg/dL   BUN 56 (H) 8 - 23 mg/dL   Creatinine, Ser 2.41 (H) 0.61 - 1.24 mg/dL   Calcium 9.3 8.9 -  10.3 mg/dL   Total Protein 7.0 6.5 - 8.1 g/dL   Albumin 3.5 3.5 - 5.0 g/dL   AST 34 15 - 41 U/L   ALT 22 0 - 44 U/L   Alkaline  Phosphatase 68 38 - 126 U/L   Total Bilirubin 0.9 0.3 - 1.2 mg/dL   GFR, Estimated 27 (L) >60 mL/min   Anion gap 9 5 - 15  Troponin I (High Sensitivity)     Status: Abnormal   Collection Time: 09/14/21  8:38 PM  Result Value Ref Range   Troponin I (High Sensitivity) 19 (H) <18 ng/L  Brain natriuretic peptide     Status: Abnormal   Collection Time: 09/14/21  8:38 PM  Result Value Ref Range   B Natriuretic Peptide 2,575.8 (H) 0.0 - 100.0 pg/mL  Resp Panel by RT-PCR (Flu A&B, Covid) Nasopharyngeal Swab     Status: None   Collection Time: 09/14/21  8:38 PM   Specimen: Nasopharyngeal Swab; Nasopharyngeal(NP) swabs in vial transport medium  Result Value Ref Range   SARS Coronavirus 2 by RT PCR NEGATIVE NEGATIVE   Influenza A by PCR NEGATIVE NEGATIVE   Influenza B by PCR NEGATIVE NEGATIVE  POC CBG, ED     Status: Abnormal   Collection Time: 09/14/21  8:43 PM  Result Value Ref Range   Glucose-Capillary 42 (LL) 70 - 99 mg/dL  CBG monitoring, ED     Status: Abnormal   Collection Time: 09/14/21  9:45 PM  Result Value Ref Range   Glucose-Capillary 43 (LL) 70 - 99 mg/dL  Troponin I (High Sensitivity)     Status: Abnormal   Collection Time: 09/14/21 10:59 PM  Result Value Ref Range   Troponin I (High Sensitivity) 19 (H) <18 ng/L  Type and screen [facility]     Status: None (Preliminary result)   Collection Time: 09/14/21 10:59 PM  Result Value Ref Range   ABO/RH(D) A POS    Antibody Screen NEG    Sample Expiration 09/17/2021,2359    Unit Number [id number]    Blood Component Type RED CELLS,LR    Unit division 00    Status of Unit ISSUED    Transfusion Status OK TO TRANSFUSE    Crossmatch Result Compatible    Unit Number [id number]    Blood Component Type RED CELLS,LR    Unit division 00    Status of Unit ISSUED    Transfusion  Status OK TO TRANSFUSE    Crossmatch Result      Compatible Performed at [facility], [address] [place], [place] [address]   Prepare RBC (crossmatch)     Status: None   Collection Time: 09/14/21 11:00 PM  Result Value Ref Range   Order Confirmation      ORDER PROCESSED BY BLOOD BANK Performed at [facility], [address] [place], [place] [address]   POC CBG, ED     Status: None   Collection Time: 09/14/21 11:43 PM  Result Value Ref Range   Glucose-Capillary 70 70 - 99 mg/dL  CBG monitoring, ED     Status: Abnormal   Collection Time: 09/15/21  6:52 AM  Result Value Ref Range   Glucose-Capillary 52 (L) 70 - 99 mg/dL  Basic metabolic panel     Status: Abnormal   Collection Time: 09/15/21  7:02 AM  Result Value Ref Range   Sodium 131 (L) 135 - 145 mmol/L   Potassium 5.3 (H) 3.5 - 5.1 mmol/L   Chloride 109 98 - 111 mmol/L   CO2 16 (L) 22 - 32 mmol/L   Glucose, Bld 56 (L) 70 - 99 mg/dL   BUN 50 (H) 8 - 23 mg/dL   Creatinine, Ser  2.23 (H) 0.61 - 1.24 mg/dL   Calcium 8.1 (L) 8.9 - 10.3 mg/dL   GFR, Estimated 30 (L) >60 mL/min   Anion gap 6 5 - 15  CBC     Status: Abnormal   Collection Time: 09/15/21  7:02 AM  Result Value Ref Range   WBC 4.7 4.0 - 10.5 K/uL   RBC 2.40 (L) 4.22 - 5.81 MIL/uL   Hemoglobin 7.0 (L) 13.0 - 17.0 g/dL   HCT 21.7 (L) 39.0 - 52.0 %   MCV 90.4 80.0 - 100.0 fL   MCH 29.2 26.0 - 34.0 pg   MCHC 32.3 30.0 - 36.0 g/dL   RDW 17.3 (H) 11.5 - 15.5 %   Platelets 132 (L) 150 - 400 K/uL   nRBC 0.0 0.0 - 0.2 %  CBG monitoring, ED     Status: None   Collection Time: 09/15/21  8:09 AM  Result Value Ref Range   Glucose-Capillary 76 70 - 99 mg/dL  Prepare RBC (crossmatch)     Status: None   Collection Time: 09/15/21  8:28 AM  Result Value Ref Range   Order Confirmation      ORDER PROCESSED BY BLOOD BANK Performed at [facility], [address] [place], [place] [address]   CBG monitoring, ED     Status: Abnormal    Collection Time: 09/15/21 12:23 PM  Result Value Ref Range   Glucose-Capillary 63 (L) 70 - 99 mg/dL   DG Chest 2 View  Result Date: 09/14/2021 CLINICAL DATA:  Shortness of breath, chest pain, weakness, and nausea for 3 days. EXAM: CHEST - 2 VIEW COMPARISON:  07/24/2021 FINDINGS: Postoperative changes in the mediastinum. Cardiac pacemaker. Mild cardiac enlargement. No vascular congestion. There is evidence of peribronchial thickening with central interstitial pattern to the lungs probably representing bronchiectasis and chronic bronchitis. No developing consolidation or edema. Minimal fluid or thickened pleura along the left costophrenic angle is unchanged since prior study. No pneumothorax. Calcification of the aorta. IMPRESSION: Chronic bronchitic changes in the lungs. Mild fluid or thickened pleura in the left costophrenic angle. No developing consolidation or edema. Electronically Signed   By: Lucienne Capers M.D.   On: 09/14/2021 21:40   ECHOCARDIOGRAM COMPLETE  Result Date: 09/15/2021    ECHOCARDIOGRAM REPORT   Patient Name:   WYNDELL CARDIFF Date of Exam: 09/15/2021 Medical Rec #:  [id number]      Height:       64.0 in Accession #:    [id number]     Weight:       123.9 lb Date of Birth:  1946-09-12       BSA:          1.596 m Patient Age:    45 years       BP:           100/62 mmHg Patient Gender: M              HR:           58 bpm. Exam Location:  [facility] Procedure: 2D Echo, Cardiac Doppler and Color Doppler Indications:     CHF-acute systolic 573.22 / G25.42  History:         Patient has prior history of Echocardiogram examinations, most                  recent 07/25/2021. Prior CABG, Arrythmias:Atrial Fibrillation;                  Risk Factors:Diabetes  and Hypertension. AICD.  Sonographer:     Sherrie Sport Referring Phys:  [place] Diagnosing Phys: [place]  1. Left ventricular ejection fraction, by estimation, is <20%. The left ventricle has severely decreased function. The  left ventricle demonstrates global hypokinesis. The left ventricular internal cavity size was severely dilated. Left ventricular diastolic parameters are consistent with Grade II diastolic dysfunction (pseudonormalization).  2. Right ventricular systolic function is normal. The right ventricular size is severely enlarged.  3. Left atrial size was severely dilated.  4. Right atrial size was severely dilated.  5. The mitral valve is normal in structure. Moderate to severe mitral valve regurgitation. No evidence of mitral stenosis.  6. Tricuspid valve regurgitation is moderate.  7. The aortic valve is normal in structure. Aortic valve regurgitation is not visualized. No aortic stenosis is present.  8. The inferior vena cava is normal in size with greater than 50% respiratory variability, suggesting right atrial pressure of 3 mmHg. Conclusion(s)/Recommendation(s): Findings consistent with ischemic cardiomyopathy. FINDINGS  Left Ventricle: Left ventricular ejection fraction, by estimation, is <20%. The left ventricle has severely decreased function. The left ventricle demonstrates global hypokinesis. The left ventricular internal cavity size was severely dilated. There is no left ventricular hypertrophy. Left ventricular diastolic parameters are consistent with Grade II diastolic dysfunction (pseudonormalization). Right Ventricle: The right ventricular size is severely enlarged. No increase in right ventricular wall thickness. Right ventricular systolic function is normal. Left Atrium: Left atrial size was severely dilated. Right Atrium: Right atrial size was severely dilated. Pericardium: There is no evidence of pericardial effusion. Mitral Valve: The mitral valve is normal in structure. Moderate to severe mitral valve regurgitation. No evidence of mitral valve stenosis. MV peak gradient, 8.8 mmHg. The mean mitral valve gradient is 4.0 mmHg. Tricuspid Valve: The tricuspid valve is normal in structure. Tricuspid valve  regurgitation is moderate . No evidence of tricuspid stenosis. Aortic Valve: The aortic valve is normal in structure. Aortic valve regurgitation is not visualized. No aortic stenosis is present. Aortic valve mean gradient measures 2.0 mmHg. Aortic valve peak gradient measures 3.7 mmHg. Aortic valve area, by VTI measures 1.81 cm. Pulmonic Valve: The pulmonic valve was normal in structure. Pulmonic valve regurgitation is mild. No evidence of pulmonic stenosis. Aorta: The aortic root is normal in size and structure. Venous: The inferior vena cava is normal in size with greater than 50% respiratory variability, suggesting right atrial pressure of 3 mmHg. IAS/Shunts: No atrial level shunt detected by color flow Doppler.  LEFT VENTRICLE PLAX 2D LVIDd:         5.40 cm LVIDs:         4.90 cm LV PW:         1.20 cm LV IVS:        0.60 cm LVOT diam:     2.00 cm LV SV:         29 LV SV Index:   18 LVOT Area:     3.14 cm  LV Volumes (MOD) LV vol d, MOD A2C: 153.0 ml LV vol d, MOD A4C: 168.0 ml LV vol s, MOD A2C: 113.0 ml LV vol s, MOD A4C: 128.0 ml LV SV MOD A2C:     40.0 ml LV SV MOD A4C:     168.0 ml LV SV MOD BP:      40.6 ml RIGHT VENTRICLE RV Basal diam:  5.60 cm RV S prime:     9.36 cm/s TAPSE (M-mode): 4.1 cm LEFT ATRIUM  Index        RIGHT ATRIUM           Index LA diam:        4.60 cm 2.88 cm/m   RA Area:     27.70 cm LA Vol (A2C):   86.1 ml 53.94 ml/m  RA Volume:   97.40 ml  61.02 ml/m LA Vol (A4C):   90.2 ml 56.51 ml/m LA Biplane Vol: 91.1 ml 57.08 ml/m  AORTIC VALVE                    PULMONIC VALVE AV Area (Vmax):    1.40 cm     PV Vmax:        0.48 m/s AV Area (Vmean):   1.50 cm     PV Vmean:       33.300 cm/s AV Area (VTI):     1.81 cm     PV VTI:         0.078 m AV Vmax:           96.65 cm/s   PV Peak grad:   0.9 mmHg AV Vmean:          62.900 cm/s  PV Mean grad:   1.0 mmHg AV VTI:            0.160 m      RVOT Peak grad: 4 mmHg AV Peak Grad:      3.7 mmHg AV Mean Grad:      2.0 mmHg LVOT  Vmax:         43.10 cm/s LVOT Vmean:        30.000 cm/s LVOT VTI:          0.092 m LVOT/AV VTI ratio: 0.58  AORTA Ao Root diam: 3.00 cm MITRAL VALVE                TRICUSPID VALVE MV Area (PHT): 6.27 cm     TR Peak grad:   37.0 mmHg MV Area VTI:   1.53 cm     TR Vmax:        304.00 cm/s MV Peak grad:  8.8 mmHg MV Mean grad:  4.0 mmHg     SHUNTS MV Vmax:       1.48 m/s     Systemic VTI:  0.09 m MV Vmean:      94.4 cm/s    Systemic Diam: 2.00 cm MV Decel Time: 121 msec     Pulmonic VTI:  0.180 m MV E velocity: 114.00 cm/s Neoma Laming Electronically signed by Neoma Laming Signature Date/Time: 09/15/2021/1:16:32 PM    Final      ASSESSMENT AND PLAN: Compensated congestive heart failure with history of atrial fibrillation and worsening renal failure.  We can hold off giving Aldactone but continue torsemide.  Also stop Xarelto because this is the second time patient requiring blood transfusion while on Xarelto.  Patient was explained that there is a risk of stroke and he has accepted those risk.  Bleeding risk is much higher thus it would be permanently stopped now.  Nhyla Nappi A

## 2021-09-15 NOTE — Progress Notes (Signed)
*  PRELIMINARY RESULTS* Echocardiogram 2D Echocardiogram has been performed.  Sherrie Sport 09/15/2021, 12:41 PM

## 2021-09-15 NOTE — Progress Notes (Signed)
OT Cancellation Note  Patient Details Name: Walter Blair MRN: [id number] DOB: 1946-03-11   Cancelled Treatment:    Reason Eval/Treat Not Completed: Medical issues which prohibited therapy. Consult received, chart reviewed. Pt currently receiving a transfusion, OT to re-attempt as able. Of note, K+ also elevated today  Ardeth Perfect., MPH, MS, OTR/L ascom [phone] 09/15/21, 12:09 PM

## 2021-09-15 NOTE — Progress Notes (Addendum)
PROGRESS NOTE    Walter Blair  KZS:[id number] DOB: Nov 17, 1945 DOA: 09/14/2021 PCP: Jodi Marble, MD    Brief Narrative:  Pt has past medical history of past medical history of heart disease status post AICD, atrial fibrillation, heart failure, CKD, GERD, hypertension, hypothyroidism, iron deficiency anemia, diabetes mellitus type 2, history of pancytopenia, history of stroke.   Walter Blair is a 75 y.o. male with a past medical history of atrial fibrillation on Xarelto, CAD, CKD, CHF, hypertension, hyperlipidemia, diabetes, presents to the emergency department for generalized weakness intermittent chest pain and nausea over the past 3 days.  According to patient family over the past 3 days he has been nauseated unable to eat, states he has been very weak and fatigued. Felt dizzy as if he was going to fall in the store and had to sit down,.  Upon arrival patient noted to be hypoglycemic initially in the 60s on last check 43.he also reports dark stools, sob  but unable to say if worse than baseline, and chest pressure which he attributed to having gas from what he ate.  12/27 received 1 unit of blood last night for hemoglobin 6.5.  Hemoglobin 7.0 today  Consultants:  Nephrology, GI, cardiology  Procedures:   Antimicrobials:      Subjective: Feels alittle better than last night. No cp. Sob about same not worse  Objective: Vitals:   09/15/21 0415 09/15/21 0423 09/15/21 0430 09/15/21 0700  BP: (!) 100/56 (!) 100/56 (!) 100/56 99/62  Pulse: (!) 59 (!) 59 (!) 59 61  Resp: 19 19 15 14   Temp:  97.6 F (36.4 C)    TempSrc:  Oral    SpO2: 100% 100% 100% 91%  Weight:      Height:        Intake/Output Summary (Last 24 hours) at 09/15/2021 0830 Last data filed at 09/15/2021 0423 Gross per 24 hour  Intake 640 ml  Output --  Net 640 ml   Filed Weights   09/14/21 2032  Weight: 56.2 kg    Examination:  General exam: Appears calm and comfortable  Respiratory system:  decrease bs , no wheezing Cardiovascular system: S1 & S2 heard, RRR.  No gallop Gastrointestinal system: Abdomen is nondistended, soft and nontender. Normal bowel sounds heard. Central nervous system: Alert and oriented.  Grossly intact. Extremities: not much LE edema Psychiatry Mood & affect appropriate.     Data Reviewed: I have personally reviewed following labs and imaging studies  CBC: Recent Labs  Lab 09/14/21 2038 09/15/21 0702  WBC 7.4 4.7  NEUTROABS 6.7  --   HGB 6.5* 7.0*  HCT 20.5* 21.7*  MCV 95.8 90.4  PLT 214 732*   Basic Metabolic Panel: Recent Labs  Lab 09/14/21 2038 09/15/21 0702  NA 132* 131*  K 5.7* 5.3*  CL 105 109  CO2 18* 16*  GLUCOSE 52* 56*  BUN 56* 50*  CREATININE 2.41* 2.23*  CALCIUM 9.3 8.1*   GFR: Estimated Creatinine Clearance: 22.4 mL/min (A) (by C-G formula based on SCr of 2.23 mg/dL (H)). Liver Function Tests: Recent Labs  Lab 09/14/21 2038  AST 34  ALT 22  ALKPHOS 68  BILITOT 0.9  PROT 7.0  ALBUMIN 3.5   No results for input(s): LIPASE, AMYLASE in the last 168 hours. No results for input(s): AMMONIA in the last 168 hours. Coagulation Profile: No results for input(s): INR, PROTIME in the last 168 hours. Cardiac Enzymes: No results for input(s): CKTOTAL, CKMB, CKMBINDEX, TROPONINI in the last  168 hours. BNP (last 3 results) No results for input(s): PROBNP in the last 8760 hours. HbA1C: No results for input(s): HGBA1C in the last 72 hours. CBG: Recent Labs  Lab 09/14/21 2043 09/14/21 2145 09/14/21 2343 09/15/21 0652 09/15/21 0809  GLUCAP 42* 43* 70 52* 76   Lipid Profile: No results for input(s): CHOL, HDL, LDLCALC, TRIG, CHOLHDL, LDLDIRECT in the last 72 hours. Thyroid Function Tests: No results for input(s): TSH, T4TOTAL, FREET4, T3FREE, THYROIDAB in the last 72 hours. Anemia Panel: No results for input(s): VITAMINB12, FOLATE, FERRITIN, TIBC, IRON, RETICCTPCT in the last 72 hours. Sepsis Labs: No results for  input(s): PROCALCITON, LATICACIDVEN in the last 168 hours.  Recent Results (from the past 240 hour(s))  Resp Panel by RT-PCR (Flu A&B, Covid) Nasopharyngeal Swab     Status: None   Collection Time: 09/14/21  8:38 PM   Specimen: Nasopharyngeal Swab; Nasopharyngeal(NP) swabs in vial transport medium  Result Value Ref Range Status   SARS Coronavirus 2 by RT PCR NEGATIVE NEGATIVE Final    Comment: (NOTE) SARS-CoV-2 target nucleic acids are NOT DETECTED.  The SARS-CoV-2 RNA is generally detectable in upper respiratory specimens during the acute phase of infection. The lowest concentration of SARS-CoV-2 viral copies this assay can detect is 138 copies/mL. A negative result does not preclude SARS-Cov-2 infection and should not be used as the sole basis for treatment or other patient management decisions. A negative result may occur with  improper specimen collection/handling, submission of specimen other than nasopharyngeal swab, presence of viral mutation(s) within the areas targeted by this assay, and inadequate number of viral copies(<138 copies/mL). A negative result must be combined with clinical observations, patient history, and epidemiological information. The expected result is Negative.  Fact Sheet for Patients:  EntrepreneurPulse.com.au  Fact Sheet for Healthcare Providers:  IncredibleEmployment.be  This test is no t yet approved or cleared by the [place] FDA and  has been authorized for detection and/or diagnosis of SARS-CoV-2 by FDA under an Emergency Use Authorization (EUA). This EUA will remain  in effect (meaning this test can be used) for the duration of the COVID-19 declaration under Section 564(b)(1) of the Act, 21 U.S.C.section 360bbb-3(b)(1), unless the authorization is terminated  or revoked sooner.       Influenza A by PCR NEGATIVE NEGATIVE Final   Influenza B by PCR NEGATIVE NEGATIVE Final    Comment: (NOTE) The  Xpert Xpress SARS-CoV-2/FLU/RSV plus assay is intended as an aid in the diagnosis of influenza from Nasopharyngeal swab specimens and should not be used as a sole basis for treatment. Nasal washings and aspirates are unacceptable for Xpert Xpress SARS-CoV-2/FLU/RSV testing.  Fact Sheet for Patients: EntrepreneurPulse.com.au  Fact Sheet for Healthcare Providers: IncredibleEmployment.be  This test is not yet approved or cleared by the [place] FDA and has been authorized for detection and/or diagnosis of SARS-CoV-2 by FDA under an Emergency Use Authorization (EUA). This EUA will remain in effect (meaning this test can be used) for the duration of the COVID-19 declaration under Section 564(b)(1) of the Act, 21 U.S.C. section 360bbb-3(b)(1), unless the authorization is terminated or revoked.  Performed at [facility], [address], [place], [place] [address]          Radiology Studies: DG Chest 2 View  Result Date: 09/14/2021 CLINICAL DATA:  Shortness of breath, chest pain, weakness, and nausea for 3 days. EXAM: CHEST - 2 VIEW COMPARISON:  07/24/2021 FINDINGS: Postoperative changes in the mediastinum. Cardiac pacemaker. Mild cardiac enlargement.  No vascular congestion. There is evidence of peribronchial thickening with central interstitial pattern to the lungs probably representing bronchiectasis and chronic bronchitis. No developing consolidation or edema. Minimal fluid or thickened pleura along the left costophrenic angle is unchanged since prior study. No pneumothorax. Calcification of the aorta. IMPRESSION: Chronic bronchitic changes in the lungs. Mild fluid or thickened pleura in the left costophrenic angle. No developing consolidation or edema. Electronically Signed   By: Lucienne Capers M.D.   On: 09/14/2021 21:40        Scheduled Meds:  sodium chloride   Intravenous Once   acetaminophen  650 mg Oral Once   amiodarone   100 mg Oral Daily   atorvastatin  80 mg Oral Daily   docusate sodium  100 mg Oral BID   furosemide  40 mg Intravenous BID   insulin aspart  0-9 Units Subcutaneous Q4H   levothyroxine  88 mcg Oral Q0600   nicotine  14 mg Transdermal Daily   patiromer  16.8 g Oral Daily   sodium chloride flush  3 mL Intravenous Q12H   Continuous Infusions:  dextrose 5 % and 0.9% NaCl 150 mL/hr at 09/15/21 7035   pantoprazole 8 mg/hr (09/15/21 0526)    Assessment & Plan:   Principal Problem:   Generalized weakness Active Problems:   Diabetes (HCC)   HTN (hypertension)   CAD (coronary artery disease)   SOB (shortness of breath)   IDA (iron deficiency anemia)   Acute on chronic systolic CHF (congestive heart failure) (HCC)   Atrial fibrillation, chronic (HCC)   Acute renal failure superimposed on stage 3a chronic kidney disease (HCC)   HLD (hyperlipidemia)   Hypothyroid   Melena   Chest pain   Hypoglycemia   Generalized Weakness Likely from anemia Once h/h more stable, will consult PT/OT   DOE/Chronic combined sys. And dias. HF DOE due to Chf v.s. anemia or combination of both BNP high EF <20% on echo today Cardiology was consulted-felt pt is compensated from chf stand point Recommed stopping xarelto b/c this is second time pt requiring blood transfusion on xarelto   GIB S/p one unit prbc Hg 7.0 this am. Will give another 1 unit prbc this am GI consulted Dc xarelto Hematology consulted per GI request  4.hyperkalemia Received vassatar K mildly elevated this am 5.3 Dc aldactone Getting lasix , monitor levels  5. AKI on ckd stage III b Baseline cr 1.59 11/22. 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 DMII with hypoglycemia Decrease po intake and was taking po meds Bg still low to nml here Hold diabetic  po med Dc basal insulin Use only riss for now   7.Anemia of CKD Receiving 2nd unit of blood today -ordered   DVT prophylaxis: scd Code Status:full Family Communication: nephew updated. Disposition Plan:  Status is: Inpatient  Remains inpatient appropriate because: IV treatment. Needs transfusion. W/u pending            LOS: 1 day   Time spent: 68 Minutes with more than 50% on [place], MD [facility] Pager [phone]  If 7PM-7AM, please contact night-coverage 09/15/2021, 8:30 AM

## 2021-09-15 NOTE — Progress Notes (Signed)
PT Cancellation Note  Patient Details Name: Walter Blair MRN: [id number] DOB: 1946/04/19   Cancelled Treatment:    Reason Eval/Treat Not Completed: Other (comment). Pt currently receiving a transfusion, PT to re-attempt as able. Of note, K+ also elevated today.    Lieutenant Diego PT, DPT 12:04 PM,09/15/21

## 2021-09-15 NOTE — Progress Notes (Signed)
Inpatient Diabetes Program Recommendations  AACE/ADA: New Consensus Statement on Inpatient Glycemic Control   Target Ranges:  Prepandial:   less than 140 mg/dL      Peak postprandial:   less than 180 mg/dL (1-2 hours)      Critically ill patients:  140 - 180 mg/dL    Latest Reference Range & Units 09/15/21 06:52 09/15/21 08:09  Glucose-Capillary 70 - 99 mg/dL 52 (L) 76   Review of Glycemic Control  Diabetes history: DM2 Outpatient Diabetes medications: Glyburide 2.5 mg daily, Glipizide XL 2.5 mg daily Current orders for Inpatient glycemic control: Novolog 0-9 units Q4H  Inpatient Diabetes Program Recommendations:    Outpatient DM medications: Noted patient has Glyburide 2.5 mg daily and Glipizide XL 2.5 mg daily listed on home medication list with both being last taken on 09/14/21. Glyburide and Glipizide are both Sulfonylureas medications and patient should not be on both Glyburide and Glipizide. Due to hypoglycemia and last A1C of 5.9%, may want to consider not continuing any Sulfonylureas and prescribe Tradjenta 5 mg daily (low risk of hypoglycemia with Tradjenta).  NOTE: Per Care Everywhere, A1C was 5.9% on 07/06/21. Patient sees Dr. Gabriel Carina (Endocrinologist) and per office note on 09/01/21, "Multiple medications have been changed including metformin and glipizide, which were both stopped and replaced with glyburide."  Patient admitted with weakness, hypoglycemia, and melena. Per H&P, "According to patient family over the past 3 days he has been nauseated unable to eat, states he has been very weak and fatigued more somnolent than normal." Due to noted hypoglycemia and poor PO intake, may want to consider discontinuing both Glyburide and Glipizide outpatient and consider ordering Tradjenta 5 mg daily (very low risk of hypoglycemia and medication is glucose dependent).  Thanks, Walter Alderman, RN, MSN, CDE Diabetes Coordinator Inpatient Diabetes Program [phone] (Team Pager from 8am to  5pm)

## 2021-09-15 NOTE — ED Notes (Addendum)
2 Orange juices given

## 2021-09-15 NOTE — Consult Note (Signed)
Vonda Antigua, MD [address] [place], [place], [place], [address] [address], [place], [place], [place], [address] Phone: [phone]  Fax: [phone]  Consultation  Referring Provider:     Dr. Kurtis Bushman Primary Care Physician:  Jodi Marble, MD Reason for Consultation:     Anemia Primary gastroenterologist: Dr. Vicente Males  Date of Admission:  09/14/2021 Date of Consultation:  09/15/2021         HPI:   Walter Blair is a 75 y.o. male with history of A. fib on Xarelto, iron deficiency anemia presents with generalized weakness, chest pain, nausea over the past 3 to 4 days.  Family present at bedside.  Patient reports having black stools since being started on oral iron, however states that he stopped it 2 weeks ago and has continued to have black stool.  No hematemesis.  Reports abdominal bloating but denies abdominal pain.  Patient also found to have AKI on admission and has history of CKD.  Was evaluated by cardiology on this admission as well with echocardiogram showing EF of 19 to 23%.  They have discontinued Xarelto  Has previously seen Dr. Vicente Males for iron deficiency anemia.  April 2022 evaluation for anemia, with EGD being normal.  Colonoscopy with a 3 mm polyp in the rectum removed.  Nonbleeding internal hemorrhoids.  Capsule study normal.  Patient saw Dr. Grayland Ormond of hematology for anemia as well.  Clinic note from November 2022 reviewed and reports that patient had a recent admission where he was noted to be anemic, with schistocytes present on peripheral smear at the time, possibly related to underlying DIC  Past Medical History:  Diagnosis Date   AICD (automatic cardioverter/defibrillator) present    Aortic atherosclerosis (HCC)    Atrial fibrillation ([place])    a.) CHA2DS2-VASc = 7 (age x 2, CHF, CVA x 2, aortic plaque, T2DM). b.) rate/rhythm maintained on oral amiodarone; chronically anticoagulated on rivaroxaban. 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 b.) TTE 07/25/2021: EF 20-25%; global HK; G3DD; severely reduced RV function; Severe BAR; mod-severe MR, severe TR, mild AR.   HLD (hyperlipidemia)    HTN (hypertension)    Hypothyroidism    IDA (iron deficiency anemia)    Ischemic cardiomyopathy    a.) TTE 06/29/2017: EF 10-15%. b.) TTE 07/25/2021: ED 20-25%.   Left-sided cerebrovascular accident (CVA) ([place]) 2004   a.) residual weakness in LEFT hand   Long term current use of anticoagulant    a.) rivaroxaban   S/P CABG x 4 2003   Performed in [place]   T2DM (type 2 diabetes mellitus) ([place])    Thrombocytopenia ([place])    possibly associated with underlying DIC    Past Surgical History:  Procedure Laterality Date   BLADDER INSTILLATION N/A 08/07/2021   Procedure: BLADDER INSTILLATION OF GEMCITABINE;  Surgeon: Billey Co, MD;  Location: [facility] ORS;  Service: Urology;  Laterality: N/A;   CARDIAC DEFIBRILLATOR PLACEMENT     CARDIOVERSION N/A 07/01/2017   Procedure: CARDIOVERSION;  Surgeon: Neoma Laming  A, MD;  Location: [facility] ORS;  Service: Cardiovascular;  Laterality: N/A;   CARDIOVERSION N/A 07/05/2017   Procedure: CARDIOVERSION;  Surgeon: Dionisio David, MD;  Location: [facility] ORS;  Service: Cardiovascular;  Laterality: N/A;   COLONOSCOPY WITH PROPOFOL N/A 12/30/2020   Procedure: COLONOSCOPY WITH PROPOFOL;  Surgeon: [name], MD;  Location: [facility] ENDOSCOPY;  Service: Gastroenterology;  Laterality: N/A;   CORONARY ARTERY BYPASS GRAFT N/A 2003   Procedure: Four-vessel CABG performed in [place] Bilateral 08/07/2021   Procedure: CYSTOSCOPY WITH RETROGRADE PYELOGRAM;  Surgeon: Billey Co, MD;  Location: [facility] ORS;  Service: Urology;  Laterality: Bilateral;   ESOPHAGOGASTRODUODENOSCOPY  12/30/2020   Procedure: ESOPHAGOGASTRODUODENOSCOPY (EGD);  Surgeon: [name], MD;  Location: [facility] ENDOSCOPY;  Service: Gastroenterology;;   GIVENS CAPSULE STUDY N/A 02/02/2021   Procedure: GIVENS CAPSULE STUDY;  Surgeon: [name], MD;  Location: [facility] ENDOSCOPY;  Service: Gastroenterology;  Laterality: N/A;  WILL NEED INTERPRETER ON WHEELS;  BROTHER WANTS TO TRANSLATE   TEE WITHOUT CARDIOVERSION N/A 07/01/2017   Procedure: TRANSESOPHAGEAL ECHOCARDIOGRAM (TEE);  Surgeon: Dionisio David, MD;  Location: [facility] ORS;  Service: Cardiovascular;  Laterality: N/A;   TEE WITHOUT CARDIOVERSION N/A 07/05/2017   Procedure: TRANSESOPHAGEAL ECHOCARDIOGRAM (TEE);  Surgeon: Dionisio David, MD;  Location: [facility] ORS;  Service: Cardiovascular;  Laterality: N/A;   TRANSURETHRAL RESECTION OF BLADDER TUMOR N/A 08/07/2021   Procedure: TRANSURETHRAL RESECTION OF BLADDER TUMOR (TURBT);  Surgeon: Billey Co, MD;  Location: [facility] ORS;  Service: Urology;  Laterality: N/A;   URETEROSCOPY Left 08/07/2021   Procedure: DIAGNOSTIC URETEROSCOPY;  Surgeon: Billey Co, MD;  Location: [facility] ORS;  Service: Urology;  Laterality: Left;    Prior to Admission medications   Medication Sig Start Date End Date Taking? Authorizing Provider  amiodarone (PACERONE) 200 MG tablet One tablet twice a day for one week then one tablet daily Patient taking differently: Take 200 mg by mouth daily. Half a tablet daily 07/06/17  Yes Wieting, Richard, MD  atorvastatin (LIPITOR) 80 MG tablet Take 80 mg by mouth daily. 10/03/20  Yes [provider]  folic acid (FOLVITE) 1 MG tablet Take 1 mg by mouth every morning.   Yes [provider]  glipiZIDE (GLUCOTROL XL) 2.5 MG 24 hr tablet Take 2.5 mg by mouth  daily. 09/05/21  Yes [provider]  glyBURIDE (DIABETA) 2.5 MG tablet Take 2.5 mg by mouth daily.   Yes [provider]  levothyroxine (SYNTHROID) 88 MCG tablet Take 1 tablet (88 mcg total) by mouth once daily Take on an empty stomach with a glass of water at least 30-60 minutes before breakfast. 09/01/21 09/01/22 Yes [provider]  pantoprazole (PROTONIX) 20 MG tablet Take 20 mg by mouth daily.   Yes [provider]  sacubitril-valsartan (ENTRESTO) 24-26 MG Take 1 tablet by mouth 2 (two) times daily. 07/06/17  Yes Wieting, Richard, MD  spironolactone (ALDACTONE) 25 MG tablet Take 0.5 tablets (12.5 mg total) by mouth daily. Patient taking differently: Take 25 mg by mouth daily. 07/06/17  Yes Wieting, Richard, MD  torsemide (DEMADEX) 20 MG tablet Take 1 tablet (20 mg total) by mouth daily. Patient taking differently: Take 20-40 mg by mouth daily as needed. 07/28/21  Yes Shelly Coss, MD  Vericiguat 5 MG TABS Take 5 mg by mouth 2 (two) times daily.   Yes [provider]  ferrous sulfate (FERROUSUL) 325 (65 FE) MG tablet Take 1 tablet twice a day every other  day for 90 days. Patient not taking: Reported on 09/15/2021 12/18/20   [name], MD  levothyroxine (SYNTHROID) 50 MCG tablet Take 50 mcg by mouth daily before breakfast. Patient not taking: Reported on 09/15/2021 07/13/21   [provider]  [facility] ULTRA test strip daily. 04/21/21   [provider]  polyethylene glycol (MIRALAX / GLYCOLAX) packet Take 17 g by mouth daily as needed. Patient not taking: Reported on 09/15/2021 07/06/17   Loletha Grayer, MD    Family History  Problem Relation Age of Onset   Diabetes Brother    Hypertension Mother    Diabetes Sister      Social History   Tobacco Use   Smoking status: Former    Types: Cigarettes   Smokeless tobacco: Former    Types: Chew  Substance Use Topics   Alcohol use: No   Drug use: No    Allergies as of  09/14/2021 - Review Complete 09/14/2021  Allergen Reaction Noted   Lorazepam Shortness Of Breath and Other (See Comments) 07/24/2021    Review of Systems:    All systems reviewed and negative except where noted in HPI.   Physical Exam:  Constitutional: General:   Alert,  Well-developed, well-nourished, pleasant and cooperative in NAD BP 98/61    Pulse 60    Temp (!) 97.4 F (36.3 C) (Oral)    Resp 16    Ht 5\' 4"  (1.626 m)    Wt 56.2 kg    SpO2 100%    BMI 21.27 kg/m   Eyes:  Sclera clear, no icterus.   Conjunctiva pink. PERRLA  Ears:  No scars, lesions or masses, Normal auditory acuity. Nose:  No deformity, discharge, or lesions. Mouth:  No deformity or lesions, oropharynx pink & moist.  Neck:  Supple; no masses or thyromegaly.  Respiratory: Normal respiratory effort, Normal percussion  Gastrointestinal:  Normal bowel sounds.  No bruits.  Soft, non-tender and non-distended without masses, hepatosplenomegaly or hernias noted.  No guarding or rebound tenderness.     Cardiac: No clubbing or edema.  No cyanosis. Normal posterior tibial pedal pulses noted.  Lymphatic:  No significant cervical or axillary adenopathy.  Psych:  Alert and cooperative. Normal mood and affect.  Musculoskeletal:  Normal gait. Head normocephalic, atraumatic. Symmetrical without gross deformities. 5/5 Upper and Lower extremity strength bilaterally.  Skin: Warm. Intact without significant lesions or rashes. No jaundice.  Neurologic:  Face symmetrical, tongue midline, Normal sensation to touch;  grossly normal neurologically.  Psych:  Alert and oriented x3, Alert and cooperative. Normal mood and affect.   LAB RESULTS: Recent Labs    09/14/21 2038 09/15/21 0702  WBC 7.4 4.7  HGB 6.5* 7.0*  HCT 20.5* 21.7*  PLT 214 132*   BMET Recent Labs    09/14/21 2038 09/15/21 0702  NA 132* 131*  K 5.7* 5.3*  CL 105 109  CO2 18* 16*  GLUCOSE 52* 56*  BUN 56* 50*  CREATININE 2.41* 2.23*  CALCIUM 9.3  8.1*   LFT Recent Labs    09/14/21 2038  PROT 7.0  ALBUMIN 3.5  AST 34  ALT 22  ALKPHOS 68  BILITOT 0.9   PT/INR No results for input(s): LABPROT, INR in the last 72 hours.  STUDIES: DG Chest 2 View  Result Date: 09/14/2021 CLINICAL DATA:  Shortness of breath, chest pain, weakness, and nausea for 3 days. EXAM: CHEST - 2 VIEW COMPARISON:  07/24/2021 FINDINGS: Postoperative changes in the mediastinum. Cardiac pacemaker. Mild cardiac enlargement. No  vascular congestion. There is evidence of peribronchial thickening with central interstitial pattern to the lungs probably representing bronchiectasis and chronic bronchitis. No developing consolidation or edema. Minimal fluid or thickened pleura along the left costophrenic angle is unchanged since prior study. No pneumothorax. Calcification of the aorta. IMPRESSION: Chronic bronchitic changes in the lungs. Mild fluid or thickened pleura in the left costophrenic angle. No developing consolidation or edema. Electronically Signed   By: Lucienne Capers M.D.   On: 09/14/2021 21:40   ECHOCARDIOGRAM COMPLETE  Result Date: 09/15/2021    ECHOCARDIOGRAM REPORT   Patient Name:   Walter Blair Date of Exam: 09/15/2021 Medical Rec #:  [id number]      Height:       64.0 in Accession #:    [id number]     Weight:       123.9 lb Date of Birth:  Aug 30, 1946       BSA:          1.596 m Patient Age:    80 years       BP:           100/62 mmHg Patient Gender: M              HR:           58 bpm. Exam Location:  [facility] Procedure: 2D Echo, Cardiac Doppler and Color Doppler Indications:     CHF-acute systolic 170.01 / V49.44  History:         Patient has prior history of Echocardiogram examinations, most                  recent 07/25/2021. Prior CABG, Arrythmias:Atrial Fibrillation;                  Risk Factors:Diabetes and Hypertension. AICD.  Sonographer:     Sherrie Sport Referring Phys:  [place] Diagnosing Phys: [place]  1. Left ventricular  ejection fraction, by estimation, is <20%. The left ventricle has severely decreased function. The left ventricle demonstrates global hypokinesis. The left ventricular internal cavity size was severely dilated. Left ventricular diastolic parameters are consistent with Grade II diastolic dysfunction (pseudonormalization).  2. Right ventricular systolic function is normal. The right ventricular size is severely enlarged.  3. Left atrial size was severely dilated.  4. Right atrial size was severely dilated.  5. The mitral valve is normal in structure. Moderate to severe mitral valve regurgitation. No evidence of mitral stenosis.  6. Tricuspid valve regurgitation is moderate.  7. The aortic valve is normal in structure. Aortic valve regurgitation is not visualized. No aortic stenosis is present.  8. The inferior vena cava is normal in size with greater than 50% respiratory variability, suggesting right atrial pressure of 3 mmHg. Conclusion(s)/Recommendation(s): Findings consistent with ischemic cardiomyopathy. FINDINGS  Left Ventricle: Left ventricular ejection fraction, by estimation, is <20%. The left ventricle has severely decreased function. The left ventricle demonstrates global hypokinesis. The left ventricular internal cavity size was severely dilated. There is no left ventricular hypertrophy. Left ventricular diastolic parameters are consistent with Grade II diastolic dysfunction (pseudonormalization). Right Ventricle: The right ventricular size is severely enlarged. No increase in right ventricular wall thickness. Right ventricular systolic function is normal. Left Atrium: Left atrial size was severely dilated. Right Atrium: Right atrial size was severely dilated. Pericardium: There is no evidence of pericardial effusion. Mitral Valve: The mitral valve is normal in structure. Moderate to severe mitral valve regurgitation. No evidence of mitral valve stenosis.  MV peak gradient, 8.8 mmHg. The mean mitral valve  gradient is 4.0 mmHg. Tricuspid Valve: The tricuspid valve is normal in structure. Tricuspid valve regurgitation is moderate . No evidence of tricuspid stenosis. Aortic Valve: The aortic valve is normal in structure. Aortic valve regurgitation is not visualized. No aortic stenosis is present. Aortic valve mean gradient measures 2.0 mmHg. Aortic valve peak gradient measures 3.7 mmHg. Aortic valve area, by VTI measures 1.81 cm. Pulmonic Valve: The pulmonic valve was normal in structure. Pulmonic valve regurgitation is mild. No evidence of pulmonic stenosis. Aorta: The aortic root is normal in size and structure. Venous: The inferior vena cava is normal in size with greater than 50% respiratory variability, suggesting right atrial pressure of 3 mmHg. IAS/Shunts: No atrial level shunt detected by color flow Doppler.  LEFT VENTRICLE PLAX 2D LVIDd:         5.40 cm LVIDs:         4.90 cm LV PW:         1.20 cm LV IVS:        0.60 cm LVOT diam:     2.00 cm LV SV:         29 LV SV Index:   18 LVOT Area:     3.14 cm  LV Volumes (MOD) LV vol d, MOD A2C: 153.0 ml LV vol d, MOD A4C: 168.0 ml LV vol s, MOD A2C: 113.0 ml LV vol s, MOD A4C: 128.0 ml LV SV MOD A2C:     40.0 ml LV SV MOD A4C:     168.0 ml LV SV MOD BP:      40.6 ml RIGHT VENTRICLE RV Basal diam:  5.60 cm RV S prime:     9.36 cm/s TAPSE (M-mode): 4.1 cm LEFT ATRIUM             Index        RIGHT ATRIUM           Index LA diam:        4.60 cm 2.88 cm/m   RA Area:     27.70 cm LA Vol (A2C):   86.1 ml 53.94 ml/m  RA Volume:   97.40 ml  61.02 ml/m LA Vol (A4C):   90.2 ml 56.51 ml/m LA Biplane Vol: 91.1 ml 57.08 ml/m  AORTIC VALVE                    PULMONIC VALVE AV Area (Vmax):    1.40 cm     PV Vmax:        0.48 m/s AV Area (Vmean):   1.50 cm     PV Vmean:       33.300 cm/s AV Area (VTI):     1.81 cm     PV VTI:         0.078 m AV Vmax:           96.65 cm/s   PV Peak grad:   0.9 mmHg AV Vmean:          62.900 cm/s  PV Mean grad:   1.0 mmHg AV VTI:             0.160 m      RVOT Peak grad: 4 mmHg AV Peak Grad:      3.7 mmHg AV Mean Grad:      2.0 mmHg LVOT Vmax:         43.10 cm/s LVOT Vmean:  30.000 cm/s LVOT VTI:          0.092 m LVOT/AV VTI ratio: 0.58  AORTA Ao Root diam: 3.00 cm MITRAL VALVE                TRICUSPID VALVE MV Area (PHT): 6.27 cm     TR Peak grad:   37.0 mmHg MV Area VTI:   1.53 cm     TR Vmax:        304.00 cm/s MV Peak grad:  8.8 mmHg MV Mean grad:  4.0 mmHg     SHUNTS MV Vmax:       1.48 m/s     Systemic VTI:  0.09 m MV Vmean:      94.4 cm/s    Systemic Diam: 2.00 cm MV Decel Time: 121 msec     Pulmonic VTI:  0.180 m MV E velocity: 114.00 cm/s Neoma Laming Electronically signed by Neoma Laming Signature Date/Time: 09/15/2021/1:16:32 PM    Final       Impression / Plan:   Walter Blair is a 75 y.o. y/o male with previous history of anemia from iron deficiency, and anemia of chronic disease with history of CKD, possible DIC in November 2022, admitted with generalized weakness, nausea at home and found to have acute anemia  Unclear if patient has been having true melena at home as he reports dark stools since being on oral iron, however he does have acute decline in his hemoglobin  Would recommend anemia work-up at this time given patient had extensive GI work-up including EGD, colonoscopy, small bowel capsule study this year and has other underlying reasons for his anemia as well  Continue medical optimization with PRBC transfusions as necessary  If patient continues to have melena, or hemoglobin does not respond, EGD can be considered on this admission.  However, if hemoglobin normalizes and patient does not have any further melena, since cardiology has recommended to hold his Xarelto indefinitely, and patient has had extensive GI work-up already this year, the benefit of endoscopic procedures in that setting would be questionable.  It would put him through risks of the procedure in the setting of low EF, without any therapeutic  benefit if he does not have any further clinical evidence of active bleeding once he is medically optimized and treated with PPI  Last Xarelto use was just prior to presentation and is being held at this time  PPI IV twice daily  Continue serial CBCs and transfuse PRN Avoid NSAIDs Maintain 2 large-bore IV lines Please page GI on-call with any acute hemodynamic changes, or signs of active GI bleeding  Given hemodynamic stability at this time, and patient awaiting medical optimization with further PRBC transfusion, no further bowel movement since presentation, no indication for emergent EGD.  Thank you for involving me in the care of this patient.      LOS: 1 day   Virgel Manifold, MD  09/15/2021, 4:57 PM

## 2021-09-16 DIAGNOSIS — K921 Melena: Secondary | ICD-10-CM | POA: Diagnosis not present

## 2021-09-16 DIAGNOSIS — D62 Acute posthemorrhagic anemia: Secondary | ICD-10-CM | POA: Diagnosis not present

## 2021-09-16 DIAGNOSIS — R531 Weakness: Secondary | ICD-10-CM

## 2021-09-16 LAB — BPAM RBC
Blood Product Expiration Date: 202301022359
Blood Product Expiration Date: 202301202359
ISSUE DATE / TIME: 202212270103
ISSUE DATE / TIME: 202212271031
Unit Type and Rh: 600
Unit Type and Rh: 6200

## 2021-09-16 LAB — GLUCOSE, CAPILLARY
Glucose-Capillary: 118 mg/dL — ABNORMAL HIGH (ref 70–99)
Glucose-Capillary: 123 mg/dL — ABNORMAL HIGH (ref 70–99)
Glucose-Capillary: 139 mg/dL — ABNORMAL HIGH (ref 70–99)
Glucose-Capillary: 90 mg/dL (ref 70–99)
Glucose-Capillary: 96 mg/dL (ref 70–99)

## 2021-09-16 LAB — BASIC METABOLIC PANEL
Anion gap: 8 (ref 5–15)
BUN: 32 mg/dL — ABNORMAL HIGH (ref 8–23)
CO2: 17 mmol/L — ABNORMAL LOW (ref 22–32)
Calcium: 8.7 mg/dL — ABNORMAL LOW (ref 8.9–10.3)
Chloride: 108 mmol/L (ref 98–111)
Creatinine, Ser: 1.79 mg/dL — ABNORMAL HIGH (ref 0.61–1.24)
GFR, Estimated: 39 mL/min — ABNORMAL LOW (ref >60)
Glucose, Bld: 139 mg/dL — ABNORMAL HIGH (ref 70–99)
Potassium: 4.5 mmol/L (ref 3.5–5.1)
Sodium: 133 mmol/L — ABNORMAL LOW (ref 135–145)

## 2021-09-16 LAB — CBC
HCT: 25.1 % — ABNORMAL LOW (ref 39.0–52.0)
Hemoglobin: 8.1 g/dL — ABNORMAL LOW (ref 13.0–17.0)
MCH: 29.1 pg (ref 26.0–34.0)
MCHC: 32.3 g/dL (ref 30.0–36.0)
MCV: 90.3 fL (ref 80.0–100.0)
Platelets: 138 10*3/uL — ABNORMAL LOW (ref 150–400)
RBC: 2.78 MIL/uL — ABNORMAL LOW (ref 4.22–5.81)
RDW: 19 % — ABNORMAL HIGH (ref 11.5–15.5)
WBC: 4.9 10*3/uL (ref 4.0–10.5)
nRBC: 0 % (ref 0.0–0.2)

## 2021-09-16 LAB — TYPE AND SCREEN
ABO/RH(D): A POS
Antibody Screen: NEGATIVE
Unit division: 0
Unit division: 0

## 2021-09-16 LAB — HEMOGLOBIN A1C
Hgb A1c MFr Bld: 5.4 % (ref 4.8–5.6)
Mean Plasma Glucose: 108 mg/dL

## 2021-09-16 MED ORDER — SODIUM CHLORIDE 0.9 % IV SOLN
250.0000 mg | Freq: Every day | INTRAVENOUS | Status: AC
  Administered 2021-09-16 – 2021-09-17 (×2): 250 mg via INTRAVENOUS
  Filled 2021-09-16 (×2): qty 20

## 2021-09-16 MED ORDER — SODIUM CHLORIDE 0.9 % IV BOLUS
250.0000 mL | Freq: Once | INTRAVENOUS | Status: AC
  Administered 2021-09-16: 02:00:00 250 mL via INTRAVENOUS

## 2021-09-16 NOTE — Anesthesia Preprocedure Evaluation (Addendum)
Anesthesia Evaluation  Patient identified by MRN, date of birth, ID band Patient awake    Reviewed: Allergy & Precautions, NPO status , Patient's Chart, lab work & pertinent test results  History of Anesthesia Complications Negative for: history of anesthetic complications  Airway Mallampati: II   Neck ROM: Full    Dental  (+) Upper Dentures Missing lower molars:   Pulmonary former smoker (quit 2001; current chewing tobacco use),    Pulmonary exam normal breath sounds clear to auscultation       Cardiovascular Exercise Tolerance: Poor hypertension, + CAD (s/p MI, CABG, stents) and +CHF (2/2 ischemic cardiomyopathy, EF 20-25%)  Normal cardiovascular exam+ dysrhythmias (a fib s/p ablations x2) + pacemaker + Cardiac Defibrillator  Rhythm:Regular Rate:Normal  TRANSTHORACIC ECHOCARDIOGRAM 08/2021: 1. Left ventricular ejection fraction, by estimation, is <20%. The left ventricle has severely decreased function. The left ventricle demonstrates global hypokinesis. The left ventricular internal cavity size was severely  dilated. Left ventricular diastolic parameters are consistent with Grade II diastolic dysfunction (pseudonormalization).  2. Right ventricular systolic function is normal. The right ventricular size is severely enlarged.  3. Left atrial size was severely dilated.  4. Right atrial size was severely dilated.  5. The mitral valve is normal in structure. Moderate to severe mitral valve regurgitation. No evidence of mitral stenosis.  6. Tricuspid valve regurgitation is moderate.  7. The aortic valve is normal in structure. Aortic valve regurgitation is not visualized. No aortic stenosis is present.  8. The inferior vena cava is normal in size with greater than 50% respiratory variability, suggesting right atrial pressure of 3 mmHg.   ECG 09/14/21: Junctional rhythm with frequent V-paced complexes; cannot rule out inferior  infarct, age undetermined; possible anterolateral infarct, age undetermined   Neuro/Psych CVA (2004, residual left hand weakness)    GI/Hepatic GERD  ,GI bleed this admission   Endo/Other  diabetes, Type 2Hypothyroidism   Renal/GU Renal disease (stage III CKD)     Musculoskeletal   Abdominal   Peds  Hematology  (+) Blood dyscrasia (thrombocytopenia, DIC during recent hospitalization 07/2021), anemia ,   Anesthesia Other Findings Preop notes regarding TURBT 08/07/21:   Reviewed and agree with Bayard Males pre-anesthesia clinical review note.  Per hematology Grayland Ormond, MD), "this patient is optimized for surgery and may proceed with the planned procedural course with an ACCEPTABLE risk of significant perioperative complications".  Per cardiology Humphrey Rolls, MD), "this patient is optimized for surgery and may proceed with the planned procedural course with an ACCEPTABLE risk of significant perioperative cardiovascular complications".  Again, this patient is on daily rivaroxaban therapy, however due to anemia and recent DIC concerns, this medication is on hold.  He is not on any other type of anticoagulation/antiplatelet therapies that may be held in the perioperative period.   Reproductive/Obstetrics                            Anesthesia Physical  Anesthesia Plan  ASA: 4  Anesthesia Plan: General   Post-op Pain Management:    Induction: Intravenous  PONV Risk Score and Plan: 2 and Propofol infusion, TIVA and Treatment may vary due to age or medical condition  Airway Management Planned: Natural Airway  Additional Equipment:   Intra-op Plan:   Post-operative Plan:   Informed Consent: I have reviewed the patients History and Physical, chart, labs and discussed the procedure including the risks, benefits and alternatives for the proposed anesthesia with the patient or authorized representative  who has indicated his/her understanding and acceptance.        Plan Discussed with: CRNA  Anesthesia Plan Comments: (LMA/GETA backup discussed.  Patient consented for risks of anesthesia including but not limited to:  - adverse reactions to medications - damage to eyes, teeth, lips or other oral mucosa - nerve damage due to positioning  - sore throat or hoarseness - damage to heart, brain, nerves, lungs, other parts of body or loss of life  Informed patient about role of CRNA in peri- and intra-operative care.  Patient voiced understanding.)        Anesthesia Quick Evaluation

## 2021-09-16 NOTE — Evaluation (Signed)
Physical Therapy Evaluation Patient Details Name: Walter Blair MRN: [id number] DOB: 10-11-1945 Today's Date: 09/16/2021  History of Present Illness  Pt is a 75 yo male that presented to ED for generalized weakness, intermittent chest pain, and nausea for several days. Work up showed hypoglycemia, as well as potential GI bleed, has undergone 1 transfusion. PMH of afib, AICD, CHF, CKD, GERD, HTN, CVA, CABG, DMII.   Clinical Impression  Pt alert, oriented x4 denied pain. Reported at baseline he lives with his brother, independent with IADLs/ADLs. Stated that just in the last couple of days he was having difficulty with mobility/ambulation due to dizziness.  The patient was able to do a few supine exercises with verbal cueing. Supine to sit with supervision, fair sitting balance. Able to sit for several minutes prior to standing without RW. He was able to take a few steps forwards and backwards with RW, and several steps forwards/backwards without RW, CGA for safety. Returned to supine with supervision due to arrival of IV team member. Pt educated on importance of continued mobility during hospital admission, verbalized understanding. Overall the patient demonstrated mild deficits in balance, mobility, and activity tolerance/endurance. He would benefit from HHPT follow up to maximize safety and function.        Recommendations for follow up therapy are one component of a multi-disciplinary discharge planning process, led by the attending physician.  Recommendations may be updated based on patient status, additional functional criteria and insurance authorization.  Follow Up Recommendations Home health PT    Assistance Recommended at Discharge Intermittent Supervision/Assistance  Functional Status Assessment Patient has had a recent decline in their functional status and demonstrates the ability to make significant improvements in function in a reasonable and predictable amount of time.  Equipment  Recommendations  Rolling walker (2 wheels)    Recommendations for Other Services       Precautions / Restrictions Precautions Precautions: Fall;ICD/Pacemaker Restrictions Weight Bearing Restrictions: No      Mobility  Bed Mobility Overal bed mobility: Needs Assistance Bed Mobility: Supine to Sit;Sit to Supine     Supine to sit: HOB elevated;Supervision Sit to supine: Supervision        Transfers Overall transfer level: Needs assistance Equipment used: Rolling walker (2 wheels);None Transfers: Sit to/from Stand Sit to Stand: Min guard;From elevated surface                Ambulation/Gait Ambulation/Gait assistance: Min guard Gait Distance (Feet): 4 Feet Assistive device: None;Rolling walker (2 wheels)         General Gait Details: further ambulation limited due to arrival of IV team member  Stairs            Wheelchair Mobility    Modified Rankin (Stroke Patients Only)       Balance Overall balance assessment: Needs assistance Sitting-balance support: Feet supported Sitting balance-Leahy Scale: Fair       Standing balance-Leahy Scale: Fair                               Pertinent Vitals/Pain Pain Assessment: No/denies pain    Home Living Family/patient expects to be discharged to:: Private residence Living Arrangements: Other relatives (brother) Available Help at Discharge: Family;Other (Comment) (pt has nearly 24/7 assist, ~4hours without assist during the day) Type of Home: House Home Access: Stairs to enter Entrance Stairs-Rails: None Entrance Stairs-Number of Steps: 4 Alternate Level Stairs-Number of Steps: flight Home Layout: Two level;Bed/bath upstairs  Prior Function Prior Level of Function : Independent/Modified Independent             Mobility Comments: Independent without AD, denies falls ADLs Comments: Independent in all ADLs     Hand Dominance        Extremity/Trunk Assessment   Upper  Extremity Assessment Upper Extremity Assessment: Defer to OT evaluation;Generalized weakness    Lower Extremity Assessment Lower Extremity Assessment: Generalized weakness    Cervical / Trunk Assessment Cervical / Trunk Assessment: Normal  Communication      Cognition Arousal/Alertness: Awake/alert Behavior During Therapy: WFL for tasks assessed/performed Overall Cognitive Status: Within Functional Limits for tasks assessed                                          General Comments      Exercises Other Exercises Other Exercises: ankle pumps x10, heel slides x10 bilaterally   Assessment/Plan    PT Assessment Patient needs continued PT services  PT Problem List Decreased strength;Decreased mobility;Decreased activity tolerance;Decreased balance;Decreased knowledge of use of DME       PT Treatment Interventions DME instruction;Therapeutic exercise;Gait training;Balance training;Stair training;Neuromuscular re-education;Functional mobility training;Therapeutic activities;Patient/family education    PT Goals (Current goals can be found in the Care Plan section)  Acute Rehab PT Goals Patient Stated Goal: to go home PT Goal Formulation: With patient Time For Goal Achievement: 09/30/21 Potential to Achieve Goals: Good    Frequency Min 2X/week   Barriers to discharge        Co-evaluation               AM-PAC PT "6 Clicks" Mobility  Outcome Measure Help needed turning from your back to your side while in a flat bed without using bedrails?: None Help needed moving from lying on your back to sitting on the side of a flat bed without using bedrails?: None Help needed moving to and from a bed to a chair (including a wheelchair)?: None Help needed standing up from a chair using your arms (e.g., wheelchair or bedside chair)?: None Help needed to walk in hospital room?: A Little Help needed climbing 3-5 steps with a railing? : A Little 6 Click Score:  22    End of Session Equipment Utilized During Treatment: Oxygen Activity Tolerance: Patient tolerated treatment well Patient left: in bed;with call bell/phone within reach;with nursing/sitter in room Nurse Communication: Mobility status PT Visit Diagnosis: Other abnormalities of gait and mobility (R26.89);Muscle weakness (generalized) (M62.81)    Time: 5409-8119 PT Time Calculation (min) (ACUTE ONLY): 32 min   Charges:   PT Evaluation $PT Eval Low Complexity: 1 Low PT Treatments $Therapeutic Exercise: 8-22 mins $Therapeutic Activity: 8-22 mins        Lieutenant Diego PT, DPT 11:51 AM,09/16/21

## 2021-09-16 NOTE — Progress Notes (Signed)
PROGRESS NOTE    Walter Blair  KVQ:[id number] DOB: Jun 02, 1946 DOA: 09/14/2021 PCP: Jodi Marble, MD    Brief Narrative:  Walter Blair is a 75 year old male with past medical history significant for chronic combined systolic/diastolic congestive heart failure, paroxysmal atrial fibrillation, CKD stage IIIa, GERD, essential hypertension, hypothyroidism, iron deficiency anemia, type 2 diabetes mellitus, history of CVA who presented to [facility] ED on 12/26 with progressive weakness, fatigue, dizziness, intermittent chest pain with nausea over the past 3 days.  Also reports dark stools for last few days.  In the ED, temperature 97.3 degrees Fahrenheit, HR 60, RR 19, BP 94/51, SPO2 97% on 2 L nasal cannula.  Sodium 132, potassium 5.7, chloride 105, CO2 18, glucose 52, BUN 56, creatinine 2.41, BNP 2570.8, high sensitive troponin 19.  WBC 7.4, hemoglobin 6.5, platelets 214.  COVID-19 PCR negative.  Influenza A/B PCR negative.  Urinalysis unrevealing.  Chest x-ray with chronic bronchitic changes lungs without consolidation/edema.  FOBT positive.  Patient was transfused 1 unit PRBC, 5 heart ML bolus of NS and 16.8 g of Veltassa given for hyperkalemia.  Hospital service consulted for further evaluation and management of acute on chronic anemia secondary to GI bleed in the setting of Xarelto use, hyperkalemia, hypotension.   Assessment & Plan:   Principal Problem:   Generalized weakness Active Problems:   Diabetes (HCC)   HTN (hypertension)   CAD (coronary artery disease)   SOB (shortness of breath)   IDA (iron deficiency anemia)   Acute on chronic systolic CHF (congestive heart failure) (HCC)   Atrial fibrillation, chronic (HCC)   Acute renal failure superimposed on stage 3a chronic kidney disease (HCC)   HLD (hyperlipidemia)   Hypothyroid   Melena   Chest pain   Hypoglycemia   Anemia concerning for upper GI bleed Hx iron deficiency anemia Patient presenting to the ED with progressive  weakness, fatigue, dizziness and started stools over the last 3 days.  Complicated by use of Xarelto outpatient for atrial fibrillation.  FOBT positive with hemoglobin 6.5 on admission. --Gastroenterology following, appreciate assistance --s/p 2u pRBC 12/27 --Hemoglobin 6.5>>8.2>8.1 --Protonix drip --Ferrlecit 200 mg IV daily x2 doses --EGD planned for 12/29; n.p.o. after midnight  Acute renal failure on CKD stage IIIa Baseline creatinine 1.59 with a GFR 45 on 08/06/2021.  Etiology likely secondary to diabetic nephropathy and hypertensive nephrosclerosis.  Acute decompensation likely secondary to acute blood loss anemia. --Cr 2.41>2.23>1.79 --Holding Entresto --S/p 2u pRBC transfusion 12/27 --Continue IV fluid hydration with D5 NS at 75 MLS per hour --BMP in a.m.  Hyperkalemia Potassium 5.7 on admission.  Likely secondary to acute renal failure in the setting of GI bleed.  Received NS bolus followed by Veltassa. --Nephrology following, patient assistance --K 5.7>>4.5 --Holding Entresto and spironolactone --BMP daily  Hypoglycemia Hx Type 2 diabetes mellitus Hemoglobin A1c 5.4, well controlled.  On glyburide 2.5 mg p.o. daily and glipizide XL 2.5 mg p.o. daily at home. --Diabetic educator following, appreciate assistance --Will discontinue glyburide/glipizide on discharge --Consider Tradjenta 5 mg p.o. daily on discharge  Chronic combined systolic/diastolic congestive heart failure, compensated Home medication regimen includes torsemide 20-40 mg p.o. daily as needed, spironolactone 12.5 mg p.o. daily, Entresto 24-26 mg p.o. twice daily.  Chest x-ray with no acute cardiopulmonary disease process on admission. --Holding Entresto, spironolactone, diuretics due to hypotension, hyperkalemia and acute renal failure --Strict I's and O's Daily weights  Paroxysmal atrial fibrillation --Cardiology following --Xarelto discontinued --Amiodarone 100 mg p.o. daily --Monitor on  telemetry  GERD:  Continue PPI drip as above  Essential hypertension Patient was notably hypotensive on admission, likely secondary to acute blood loss anemia as above. --Holding home Entresto, spironolactone, torsemide above  Hyperlipidemia: Atorvastatin 80 mg p.o. daily  Hypothyroidism --Levothyroxine 88 mcg p.o. daily  Tobacco use disorder: Counseled on need for complete cessation. --Nicotine patch  Hx CVA --Continue statin   DVT prophylaxis: SCDs Start: 09/14/21 2330   Code Status: Full Code Family Communication: No family present at bedside this morning  Disposition Plan:  Level of care: Stepdown Status is: Inpatient  Remains inpatient appropriate because: Pending EGD tomorrow, on PPI drip, IV fluid hydration.    Consultants:  Cardiology, [facility], Dr. Enriqueta Shutter Nephrology, Dr. Juleen China Medical oncology/hematology, Dr. Grayland Ormond  Procedures:  None  Antimicrobials:  None   Subjective: Patient seen examined bedside, resting comfortably.  No complaints this morning.  Continues with mild weakness and fatigue.  Seen by GI and planning EGD tomorrow.  Hemoglobin stable following transfusion yesterday.  No other questions or concerns at this time.  Denies headache, no visual changes, no chest pain, palpitations, no shortness of breath, no abdominal pain, no fever/chills/night sweats, no nausea/vomiting/diarrhea, no paresthesias.  No acute events overnight per nursing staff.  Hemodynamically stable for transfer to telemetry.  Objective: Vitals:   09/16/21 1200 09/16/21 1300 09/16/21 1400 09/16/21 1500  BP: 129/73 116/67 118/73 127/66  Pulse: 64 61 63 60  Resp: 17 17 19 18   Temp: 97.8 F (36.6 C)     TempSrc: Oral     SpO2: 97% 100% 98% 91%  Weight:      Height:        Intake/Output Summary (Last 24 hours) at 09/16/2021 1607 Last data filed at 09/16/2021 1100 Gross per 24 hour  Intake 3755.99 ml  Output 2650 ml  Net 1105.99 ml   Filed  Weights   09/14/21 2032 09/15/21 2205 09/16/21 0500  Weight: 56.2 kg 60 kg 60.3 kg    Examination:  General exam: Appears calm and comfortable, thin in appearance Respiratory system: Clear to auscultation. Respiratory effort normal.  On 2 L nasal cannula with SPO2 99% at rest Cardiovascular system: S1 & S2 heard, RRR. No JVD, murmurs, rubs, gallops or clicks. No pedal edema. Gastrointestinal system: Abdomen is nondistended, soft and nontender. No organomegaly or masses felt. Normal bowel sounds heard. Central nervous system: Alert and oriented. No focal neurological deficits. Extremities: Symmetric 5 x 5 power. Skin: No rashes, lesions or ulcers Psychiatry: Judgement and insight appear normal. Mood & affect appropriate.     Data Reviewed: I have personally reviewed following labs and imaging studies  CBC: Recent Labs  Lab 09/14/21 2038 09/15/21 0702 09/15/21 2047 09/16/21 0830  WBC 7.4 4.7  --  4.9  NEUTROABS 6.7  --   --   --   HGB 6.5* 7.0* 8.2* 8.1*  HCT 20.5* 21.7* 25.6* 25.1*  MCV 95.8 90.4  --  90.3  PLT 214 132*  --  676*   Basic Metabolic Panel: Recent Labs  Lab 09/14/21 2038 09/15/21 0702 09/16/21 1133  NA 132* 131* 133*  K 5.7* 5.3* 4.5  CL 105 109 108  CO2 18* 16* 17*  GLUCOSE 52* 56* 139*  BUN 56* 50* 32*  CREATININE 2.41* 2.23* 1.79*  CALCIUM 9.3 8.1* 8.7*   GFR: Estimated Creatinine Clearance: 29.4 mL/min (A) (by C-G formula based on SCr of 1.79 mg/dL (H)). Liver Function Tests: Recent Labs  Lab 09/14/21 2038  AST 34  ALT 22  ALKPHOS 68  BILITOT 0.9  PROT 7.0  ALBUMIN 3.5   No results for input(s): LIPASE, AMYLASE in the last 168 hours. No results for input(s): AMMONIA in the last 168 hours. Coagulation Profile: Recent Labs  Lab 09/15/21 1828  INR 2.1*   Cardiac Enzymes: No results for input(s): CKTOTAL, CKMB, CKMBINDEX, TROPONINI in the last 168 hours. BNP (last 3 results) No results for input(s): PROBNP in the last 8760  hours. HbA1C: Recent Labs    09/15/21 0702  HGBA1C 5.4   CBG: Recent Labs  Lab 09/15/21 2208 09/16/21 0503 09/16/21 0726 09/16/21 1156 09/16/21 1532  GLUCAP 95 123* 96 118* 139*   Lipid Profile: No results for input(s): CHOL, HDL, LDLCALC, TRIG, CHOLHDL, LDLDIRECT in the last 72 hours. Thyroid Function Tests: No results for input(s): TSH, T4TOTAL, FREET4, T3FREE, THYROIDAB in the last 72 hours. Anemia Panel: Recent Labs    09/15/21 1828  VITAMINB12 214  TIBC 301  IRON 42*   Sepsis Labs: No results for input(s): PROCALCITON, LATICACIDVEN in the last 168 hours.  Recent Results (from the past 240 hour(s))  Resp Panel by RT-PCR (Flu A&B, Covid) Nasopharyngeal Swab     Status: None   Collection Time: 09/14/21  8:38 PM   Specimen: Nasopharyngeal Swab; Nasopharyngeal(NP) swabs in vial transport medium  Result Value Ref Range Status   SARS Coronavirus 2 by RT PCR NEGATIVE NEGATIVE Final    Comment: (NOTE) SARS-CoV-2 target nucleic acids are NOT DETECTED.  The SARS-CoV-2 RNA is generally detectable in upper respiratory specimens during the acute phase of infection. The lowest concentration of SARS-CoV-2 viral copies this assay can detect is 138 copies/mL. A negative result does not preclude SARS-Cov-2 infection and should not be used as the sole basis for treatment or other patient management decisions. A negative result may occur with  improper specimen collection/handling, submission of specimen other than nasopharyngeal swab, presence of viral mutation(s) within the areas targeted by this assay, and inadequate number of viral copies(<138 copies/mL). A negative result must be combined with clinical observations, patient history, and epidemiological information. The expected result is Negative.  Fact Sheet for Patients:  EntrepreneurPulse.com.au  Fact Sheet for Healthcare Providers:  IncredibleEmployment.be  This test is no t yet  approved or cleared by the [place] FDA and  has been authorized for detection and/or diagnosis of SARS-CoV-2 by FDA under an Emergency Use Authorization (EUA). This EUA will remain  in effect (meaning this test can be used) for the duration of the COVID-19 declaration under Section 564(b)(1) of the Act, 21 U.S.C.section 360bbb-3(b)(1), unless the authorization is terminated  or revoked sooner.       Influenza A by PCR NEGATIVE NEGATIVE Final   Influenza B by PCR NEGATIVE NEGATIVE Final    Comment: (NOTE) The Xpert Xpress SARS-CoV-2/FLU/RSV plus assay is intended as an aid in the diagnosis of influenza from Nasopharyngeal swab specimens and should not be used as a sole basis for treatment. Nasal washings and aspirates are unacceptable for Xpert Xpress SARS-CoV-2/FLU/RSV testing.  Fact Sheet for Patients: EntrepreneurPulse.com.au  Fact Sheet for Healthcare Providers: IncredibleEmployment.be  This test is not yet approved or cleared by the [place] FDA and has been authorized for detection and/or diagnosis of SARS-CoV-2 by FDA under an Emergency Use Authorization (EUA). This EUA will remain in effect (meaning this test can be used) for the duration of the COVID-19 declaration under Section 564(b)(1) of the Act, 21 U.S.C. section 360bbb-3(b)(1), unless the authorization is terminated or revoked.  Performed at [facility], [address] [place], [place] [address]   MRSA Next Gen by PCR, Nasal     Status: None   Collection Time: 09/15/21 10:15 PM   Specimen: Nasal Mucosa; Nasal Swab  Result Value Ref Range Status   MRSA by PCR Next Gen NOT DETECTED NOT DETECTED Final    Comment: (NOTE) The GeneXpert MRSA Assay (FDA approved for NASAL specimens only), is one component of a comprehensive MRSA colonization surveillance program. It is not intended to diagnose MRSA infection nor to guide or monitor treatment for MRSA  infections. Test performance is not FDA approved in patients less than 94 years old. Performed at [facility], [address], [place], [place] [address]          Radiology Studies: DG Chest 2 View  Result Date: 09/14/2021 CLINICAL DATA:  Shortness of breath, chest pain, weakness, and nausea for 3 days. EXAM: CHEST - 2 VIEW COMPARISON:  07/24/2021 FINDINGS: Postoperative changes in the mediastinum. Cardiac pacemaker. Mild cardiac enlargement. No vascular congestion. There is evidence of peribronchial thickening with central interstitial pattern to the lungs probably representing bronchiectasis and chronic bronchitis. No developing consolidation or edema. Minimal fluid or thickened pleura along the left costophrenic angle is unchanged since prior study. No pneumothorax. Calcification of the aorta. IMPRESSION: Chronic bronchitic changes in the lungs. Mild fluid or thickened pleura in the left costophrenic angle. No developing consolidation or edema. Electronically Signed   By: Lucienne Capers M.D.   On: 09/14/2021 21:40   ECHOCARDIOGRAM COMPLETE  Result Date: 09/15/2021    ECHOCARDIOGRAM REPORT   Patient Name:   Walter Blair Date of Exam: 09/15/2021 Medical Rec #:  [id number]      Height:       64.0 in Accession #:    [id number]     Weight:       123.9 lb Date of Birth:  [birth year]       BSA:          1.596 m Patient Age:    38 years       BP:           100/62 mmHg Patient Gender: M              HR:           58 bpm. Exam Location:  [facility] Procedure: 2D Echo, Cardiac Doppler and Color Doppler Indications:     CHF-acute systolic 400.86 / P61.95  History:         Patient has prior history of Echocardiogram examinations, most                  recent 07/25/2021. Prior CABG, Arrythmias:Atrial Fibrillation;                  Risk Factors:Diabetes and Hypertension. AICD.  Sonographer:     Sherrie Sport Referring Phys:  [place] Diagnosing Phys: [place]  1. Left ventricular  ejection fraction, by estimation, is <20%. The left ventricle has severely decreased function. The left ventricle demonstrates global hypokinesis. The left ventricular internal cavity size was severely dilated. Left ventricular diastolic parameters are consistent with Grade II diastolic dysfunction (pseudonormalization).  2. Right ventricular systolic function is normal. The right ventricular size is severely enlarged.  3. Left atrial size was severely dilated.  4. Right atrial size was severely dilated.  5. The mitral valve is normal in structure. Moderate to severe mitral valve regurgitation. No  evidence of mitral stenosis.  6. Tricuspid valve regurgitation is moderate.  7. The aortic valve is normal in structure. Aortic valve regurgitation is not visualized. No aortic stenosis is present.  8. The inferior vena cava is normal in size with greater than 50% respiratory variability, suggesting right atrial pressure of 3 mmHg. Conclusion(s)/Recommendation(s): Findings consistent with ischemic cardiomyopathy. FINDINGS  Left Ventricle: Left ventricular ejection fraction, by estimation, is <20%. The left ventricle has severely decreased function. The left ventricle demonstrates global hypokinesis. The left ventricular internal cavity size was severely dilated. There is no left ventricular hypertrophy. Left ventricular diastolic parameters are consistent with Grade II diastolic dysfunction (pseudonormalization). Right Ventricle: The right ventricular size is severely enlarged. No increase in right ventricular wall thickness. Right ventricular systolic function is normal. Left Atrium: Left atrial size was severely dilated. Right Atrium: Right atrial size was severely dilated. Pericardium: There is no evidence of pericardial effusion. Mitral Valve: The mitral valve is normal in structure. Moderate to severe mitral valve regurgitation. No evidence of mitral valve stenosis. MV peak gradient, 8.8 mmHg. The mean mitral valve  gradient is 4.0 mmHg. Tricuspid Valve: The tricuspid valve is normal in structure. Tricuspid valve regurgitation is moderate . No evidence of tricuspid stenosis. Aortic Valve: The aortic valve is normal in structure. Aortic valve regurgitation is not visualized. No aortic stenosis is present. Aortic valve mean gradient measures 2.0 mmHg. Aortic valve peak gradient measures 3.7 mmHg. Aortic valve area, by VTI measures 1.81 cm. Pulmonic Valve: The pulmonic valve was normal in structure. Pulmonic valve regurgitation is mild. No evidence of pulmonic stenosis. Aorta: The aortic root is normal in size and structure. Venous: The inferior vena cava is normal in size with greater than 50% respiratory variability, suggesting right atrial pressure of 3 mmHg. IAS/Shunts: No atrial level shunt detected by color flow Doppler.  LEFT VENTRICLE PLAX 2D LVIDd:         5.40 cm LVIDs:         4.90 cm LV PW:         1.20 cm LV IVS:        0.60 cm LVOT diam:     2.00 cm LV SV:         29 LV SV Index:   18 LVOT Area:     3.14 cm  LV Volumes (MOD) LV vol d, MOD A2C: 153.0 ml LV vol d, MOD A4C: 168.0 ml LV vol s, MOD A2C: 113.0 ml LV vol s, MOD A4C: 128.0 ml LV SV MOD A2C:     40.0 ml LV SV MOD A4C:     168.0 ml LV SV MOD BP:      40.6 ml RIGHT VENTRICLE RV Basal diam:  5.60 cm RV S prime:     9.36 cm/s TAPSE (M-mode): 4.1 cm LEFT ATRIUM             Index        RIGHT ATRIUM           Index LA diam:        4.60 cm 2.88 cm/m   RA Area:     27.70 cm LA Vol (A2C):   86.1 ml 53.94 ml/m  RA Volume:   97.40 ml  61.02 ml/m LA Vol (A4C):   90.2 ml 56.51 ml/m LA Biplane Vol: 91.1 ml 57.08 ml/m  AORTIC VALVE                    PULMONIC VALVE  AV Area (Vmax):    1.40 cm     PV Vmax:        0.48 m/s AV Area (Vmean):   1.50 cm     PV Vmean:       33.300 cm/s AV Area (VTI):     1.81 cm     PV VTI:         0.078 m AV Vmax:           96.65 cm/s   PV Peak grad:   0.9 mmHg AV Vmean:          62.900 cm/s  PV Mean grad:   1.0 mmHg AV VTI:             0.160 m      RVOT Peak grad: 4 mmHg AV Peak Grad:      3.7 mmHg AV Mean Grad:      2.0 mmHg LVOT Vmax:         43.10 cm/s LVOT Vmean:        30.000 cm/s LVOT VTI:          0.092 m LVOT/AV VTI ratio: 0.58  AORTA Ao Root diam: 3.00 cm MITRAL VALVE                TRICUSPID VALVE MV Area (PHT): 6.27 cm     TR Peak grad:   37.0 mmHg MV Area VTI:   1.53 cm     TR Vmax:        304.00 cm/s MV Peak grad:  8.8 mmHg MV Mean grad:  4.0 mmHg     SHUNTS MV Vmax:       1.48 m/s     Systemic VTI:  0.09 m MV Vmean:      94.4 cm/s    Systemic Diam: 2.00 cm MV Decel Time: 121 msec     Pulmonic VTI:  0.180 m MV E velocity: 114.00 cm/s [employer] Electronically signed by Neoma Laming Signature Date/Time: 09/15/2021/1:16:32 PM    Final         Scheduled Meds:  acetaminophen  650 mg Oral Once   amiodarone  100 mg Oral Daily   atorvastatin  80 mg Oral Daily   Chlorhexidine Gluconate Cloth  6 each Topical Q0600   docusate sodium  100 mg Oral BID   insulin aspart  0-9 Units Subcutaneous Q4H   levothyroxine  88 mcg Oral Q0600   nicotine  14 mg Transdermal Daily   patiromer  16.8 g Oral Daily   pneumococcal 23 valent vaccine  0.5 mL Intramuscular Tomorrow-1000   sodium chloride flush  3 mL Intravenous Q12H   Continuous Infusions:  dextrose 5 % and 0.9% NaCl 75 mL/hr at 09/15/21 2217   ferric gluconate (FERRLECIT) IVPB 250 mg (09/16/21 1405)   pantoprazole 8 mg/hr (09/16/21 1122)     LOS: 2 days    Time spent: 39 minutes spent on chart review, discussion with nursing staff, consultants, updating family and interview/physical exam; more than 50% of that time was spent in counseling and/or coordination of care.    Yancey Pedley J [place] DO [facility] Available via Epic secure chat 7am-7pm After these hours, please refer to coverage provider listed on [email or web address] 09/16/2021, 4:07 PM

## 2021-09-16 NOTE — Evaluation (Signed)
Occupational Therapy Evaluation Patient Details Name: Walter Blair MRN: [id number] DOB: Jul 17, 1946 Today's Date: 09/16/2021   History of Present Illness Pt is a 75 yo male that presented to ED for generalized weakness, intermittent chest pain, and nausea for several days. Work up showed hypoglycemia, as well as potential GI bleed, has undergone 1 transfusion. PMH of afib, AICD, CHF, CKD, GERD, HTN, CVA, CABG, DMII.   Clinical Impression   Pt seen for OT evaluation this date in setting of acute hospitalization d/t generalized weakness. He reports being INDEP for ADLs and ADL mobility at baseline with family assist for IADLs. Pt presents this date with decreased fxl activity tolerance, strength and balance. He currently requires: SETUP for seated UB ADLs, MOD A For seated LB ADLs, MIN A/CGA for ADL transfers with hand held assist. Pt left in chair end of session with all needs met and in reach, SETUP for breakfast with his nephew present in room. Educated on safe use of RW and that he needs to be using as he was noted to have 2 near LOB with short bouts of fxl mobility in the room with HHA/CGA from therapist. Will continue to follow acutely. Recommend HHOT f/u.      Recommendations for follow up therapy are one component of a multi-disciplinary discharge planning process, led by the attending physician.  Recommendations may be updated based on patient status, additional functional criteria and insurance authorization.   Follow Up Recommendations  Home health OT    Assistance Recommended at Discharge Frequent or constant Supervision/Assistance  Functional Status Assessment  Patient has had a recent decline in their functional status and demonstrates the ability to make significant improvements in function in a reasonable and predictable amount of time.  Equipment Recommendations  BSC/3in1;Tub/shower seat;Other (comment) (2ww)    Recommendations for Other Services       Precautions /  Restrictions Precautions Precautions: Fall;ICD/Pacemaker Restrictions Weight Bearing Restrictions: No      Mobility Bed Mobility Overal bed mobility: Needs Assistance Bed Mobility: Supine to Sit;Sit to Supine     Supine to sit: HOB elevated;Supervision Sit to supine: Supervision        Transfers Overall transfer level: Needs assistance Equipment used: 1 person hand held assist Transfers: Sit to/from Stand Sit to Stand: Min guard;From elevated surface           General transfer comment: HHA, cues for safety, noted to have 2 near LOB      Balance Overall balance assessment: Needs assistance Sitting-balance support: Feet supported Sitting balance-Leahy Scale: Fair       Standing balance-Leahy Scale: Poor Standing balance comment: F with UE support and P without. He needs to use walker and was educated on this                           ADL either performed or assessed with clinical judgement   ADL                                         General ADL Comments: Pt requires SETUP for seated UB ADLs, MOD A For seated LB ADLs, MIN A/CGA for ADL transfers with hand held assist.     Vision Patient Visual Report: No change from baseline       Perception     Praxis      Pertinent Vitals/Pain Pain  Assessment: No/denies pain     Hand Dominance     Extremity/Trunk Assessment Upper Extremity Assessment Upper Extremity Assessment: Generalized weakness   Lower Extremity Assessment Lower Extremity Assessment: Generalized weakness   Cervical / Trunk Assessment Cervical / Trunk Assessment: Normal   Communication Communication Communication: No difficulties (repeats himself)   Cognition Arousal/Alertness: Awake/alert Behavior During Therapy: WFL for tasks assessed/performed Overall Cognitive Status: Within Functional Limits for tasks assessed                                 General Comments: repeats himself/requests  frequently, almost demanding. Able to follow all commands.     General Comments       Exercises Other Exercises Other Exercises: OT engages pt in STS and fxl mobility ~20' in the room with hand held assist. Pt with 2 near LOB requiring OT assist to correct/prevent fall. Left in chair end of session, tray SETUP.   Shoulder Instructions      Home Living Family/patient expects to be discharged to:: Private residence Living Arrangements: Other relatives (brother and sister in law) Available Help at Discharge: Family;Other (Comment) (nephew lives close by and checks on patient nearly every day. His brother does work, but pt is only alone for 4-8hr periods max.) Type of Home: House Home Access: Stairs to enter Technical brewer of Steps: 4 Entrance Stairs-Rails: None Home Layout: Two level;Bed/bath upstairs Alternate Level Stairs-Number of Steps: flight Alternate Level Stairs-Rails: None (states there is a rail not sure which side, and a chair lift he does not use) Bathroom Shower/Tub: Occupational psychologist: Handicapped height     Home Equipment: Conservation officer, nature (2 wheels);Cane - single point          Prior Functioning/Environment Prior Level of Function : Independent/Modified Independent             Mobility Comments: Independent without AD, denies falls ADLs Comments: Independent in all ADLs        OT Problem List: Decreased strength;Decreased activity tolerance;Decreased knowledge of use of DME or AE;Impaired balance (sitting and/or standing)      OT Treatment/Interventions: Self-care/ADL training;Therapeutic exercise;DME and/or AE instruction;Therapeutic activities;Patient/family education;Balance training    OT Goals(Current goals can be found in the care plan section) Acute Rehab OT Goals Patient Stated Goal: to go home OT Goal Formulation: With patient/family Time For Goal Achievement: 09/30/21 Potential to Achieve Goals: Fair ADL Goals Pt Will  Perform Lower Body Dressing: with supervision Pt Will Transfer to Toilet: with supervision;ambulating Pt Will Perform Toileting - Clothing Manipulation and hygiene: with supervision;sit to/from stand  OT Frequency: Min 2X/week   Barriers to D/C:            Co-evaluation              AM-PAC OT "6 Clicks" Daily Activity     Outcome Measure Help from another person eating meals?: None Help from another person taking care of personal grooming?: A Little Help from another person toileting, which includes using toliet, bedpan, or urinal?: A Lot Help from another person bathing (including washing, rinsing, drying)?: A Lot Help from another person to put on and taking off regular upper body clothing?: A Little Help from another person to put on and taking off regular lower body clothing?: A Lot 6 Click Score: 16   End of Session Equipment Utilized During Treatment: Gait belt Nurse Communication: Mobility status  Activity Tolerance: Patient  tolerated treatment well Patient left: in chair;with bed alarm set  OT Visit Diagnosis: Unsteadiness on feet (R26.81);History of falling (Z91.81)                Time: 4436-0165 OT Time Calculation (min): 21 min Charges:  OT General Charges $OT Visit: 1 Visit OT Evaluation $OT Eval Moderate Complexity: 1 Mod OT Treatments $Therapeutic Activity: 8-22 mins  Gerrianne Scale, MS, OTR/L ascom [phone] 09/16/21, 4:00 PM

## 2021-09-16 NOTE — Progress Notes (Signed)
Patient is currently stable enough to proceed with a low risk procedure such as EGD and colonoscopy.  He has chronic compensated heart failure with worsening renal function and severe anemia.  He needs evaluation of possible GI bleed.  Xarelto will be held permanently for now.  Advise proceeding with EGD and colonoscopy

## 2021-09-16 NOTE — Progress Notes (Signed)
Nutrition Brief Note  RD consulted for assessment of nutritional requirements/ status.   Wt Readings from Last 15 Encounters:  09/16/21 60.3 kg  08/11/21 56.2 kg  08/11/21 56.2 kg  08/07/21 58.1 kg  08/05/21 58.1 kg  08/04/21 58.1 kg  07/28/21 56.4 kg  07/16/21 63.2 kg  03/11/21 57.2 kg  12/30/20 59 kg  12/16/20 59 kg  07/06/17 55 kg   Walter Blair is a 75 y.o. male seen in ed with complaints of generalized weakness hypoglycemia decreased p.o. intake found to be hypoglycemic at home and brought to the emergency room via EMS.  Patient reported to be short of breath and hypoglycemic on arrival with BG's of 50s.  Patient reports of intermittent chest pain with injury and indigestion symptoms and dark brown stools over the past week or 2 patient is currently not on iron supplementation.  Pt admitted with generalized weakness related to GIB and CHF.   Reviewed I/O's: +2.6 L x 24 hours and +3.3 L since admission  Spoke with pt and nephew at bedside. Pt reports he has a good appetite, but was unable to eat much breakfast due to it being cold and him being interrupted numerous times for patient care. He typically consumes 3 meals per day (Breakfast: eggs, toast, jelly, and coffee; Lunch: fruit and cereal; Dinner: meat, starch, and vegetable). Meals are prepared by pt's sister-in-law, who resides with him.  Pt reports compliance with self-monitoring of DM and CHF. He takes his diuretic 1-2 times daily and weighs himself every morning. He also checks his CBGS daily; all readings within 120. Per pt, he has never had hypoglycemia before, but knew something was wrong when he started feeling sweaty and light-headed. RD reviewed sings and symptoms of hypoglycemia and enforced importance of continuing to follow a regular meal pattern, check CBGS, and take medications as prescribed. Also reinforced CHF self-management practices. He was able to teachback to this RD when to call MD if/when his weight  changes.   Per pt, UBW is around 123-125#. Reviewed wt hx; wt has been stable over the past 8 months.   Nutrition-Focused physical exam completed. Findings are no fat depletion, mild muscle depletion, and no edema.    Medications reviewed and include colace  Lab Results  Component Value Date   HGBA1C 5.4 09/15/2021   PTA DM medications are 2.5 mg glipizide daily.   Labs reviewed: CBGS: 87-123 (inpatient orders for glycemic control are 0-9 units inuslin aspart every 4 hours).    Current diet order is heart healthy/ carb modified, patient is consuming approximately 100% of meals at this time. Labs and medications reviewed.   No nutrition interventions warranted at this time. If nutrition issues arise, please consult RD.   Loistine Chance, RD, LDN, [place] Registered Dietitian II Certified Diabetes Care and Education Specialist Please refer to [facility] for RD and/or RD on-call/weekend/after hours pager

## 2021-09-16 NOTE — Consult Note (Signed)
[place]  [phone] [phone]  ID: [name] OB: [birth year]  MR#: [id number]  CSN#:[id number]  Patient Care Team: Jodi Marble, MD as PCP - General (Internal Medicine)  CHIEF COMPLAINT: Symptomatic anemia.  INTERVAL HISTORY: Patient is a 75 year old male who was last evaluated in clinic approximately 6 weeks ago who noticed increasing weakness and fatigue over the past several weeks.  Upon evaluation in the emergency room he was noted to have a hemoglobin of 6.5 which is now improved to 8.1 with blood transfusion.  His son reports dark stools, but is unclear if this is related to oral iron supplementation or GI bleed.  He has no neurologic complaints.  He denies any recent fevers or illnesses.  He has a fair appetite, but denies weight loss.  He has no chest pain, shortness of breath,, or hemoptysis.  He denies any nausea, vomiting, constipation, or diarrhea.  He has no urinary complaints.  Patient offers no further specific complaints today.  REVIEW OF SYSTEMS:   Review of Systems  Constitutional:  Positive for malaise/fatigue. Negative for fever and weight loss.  Respiratory: Negative.  Negative for cough, hemoptysis and shortness of breath.   Cardiovascular: Negative.  Negative for chest pain and leg swelling.  Gastrointestinal:  Positive for melena. Negative for abdominal pain and blood in stool.  Genitourinary: Negative.  Negative for hematuria.  Musculoskeletal:  Negative for back pain.  Skin: Negative.  Negative for rash.  Neurological:  Positive for weakness. Negative for dizziness, focal weakness and headaches.  Psychiatric/Behavioral: Negative.  The patient is not nervous/anxious.    As per HPI. Otherwise, a complete review of systems is negative.  PAST MEDICAL HISTORY: Past Medical History:  Diagnosis Date   AICD (automatic cardioverter/defibrillator) present    Aortic atherosclerosis ([place])    Atrial fibrillation ([place])     a.) CHA2DS2-VASc = 7 (age x 2, CHF, CVA x 2, aortic plaque, T2DM). b.) rate/rhythm maintained on oral amiodarone; chronically anticoagulated on rivaroxaban. 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 b.) TTE 07/25/2021: EF 20-25%; global HK; G3DD; severely reduced RV function; Severe BAR; mod-severe MR, severe TR, mild AR.   HLD (hyperlipidemia)    HTN (hypertension)    Hypothyroidism    IDA (iron deficiency anemia)    Ischemic cardiomyopathy    a.) TTE 06/29/2017: EF 10-15%. b.) TTE 07/25/2021: ED 20-25%.   Left-sided cerebrovascular accident (CVA) ([place]) 2004   a.) residual weakness in LEFT hand   Long term current use of anticoagulant    a.) rivaroxaban   S/P CABG x 4 2003   Performed in [place]   T2DM (type 2 diabetes mellitus) ([place])    Thrombocytopenia ([place])    possibly associated with underlying DIC    PAST SURGICAL HISTORY: Past Surgical History:  Procedure Laterality Date   BLADDER INSTILLATION N/A 08/07/2021   Procedure: BLADDER INSTILLATION OF GEMCITABINE;  Surgeon: Billey Co, MD;  Location: [facility] ORS;  Service: Urology;  Laterality: N/A;   CARDIAC DEFIBRILLATOR PLACEMENT  CARDIOVERSION N/A 07/01/2017   Procedure: CARDIOVERSION;  Surgeon: Dionisio David, MD;  Location: [facility] ORS;  Service: Cardiovascular;  Laterality: N/A;   CARDIOVERSION N/A 07/05/2017   Procedure: CARDIOVERSION;  Surgeon: Dionisio David, MD;  Location: [facility] ORS;  Service: Cardiovascular;  Laterality: N/A;   COLONOSCOPY WITH PROPOFOL N/A 12/30/2020   Procedure:  COLONOSCOPY WITH PROPOFOL;  Surgeon: [name], MD;  Location: [facility] ENDOSCOPY;  Service: Gastroenterology;  Laterality: N/A;   CORONARY ARTERY BYPASS GRAFT N/A 2003   Procedure: Four-vessel CABG performed in [place] Bilateral 08/07/2021   Procedure: CYSTOSCOPY WITH RETROGRADE PYELOGRAM;  Surgeon: Billey Co, MD;  Location: [facility] ORS;  Service: Urology;  Laterality: Bilateral;   ESOPHAGOGASTRODUODENOSCOPY  12/30/2020   Procedure: ESOPHAGOGASTRODUODENOSCOPY (EGD);  Surgeon: [name], MD;  Location[facility] ENDOSCOPY;  Service: Gastroenterology;;   GIVENS CAPSULE STUDY N/A 02/02/2021   Procedure: GIVENS CAPSULE STUDY;  Surgeon: [name], MD;  Location: [facility] ENDOSCOPY;  Service: Gastroenterology;  Laterality: N/A;  WILL NEED INTERPRETER ON WHEELS;  BROTHER WANTS TO TRANSLATE   TEE WITHOUT CARDIOVERSION N/A 07/01/2017   Procedure: TRANSESOPHAGEAL ECHOCARDIOGRAM (TEE);  Surgeon: Dionisio David, MD;  Location: [facility] ORS;  Service: Cardiovascular;  Laterality: N/A;   TEE WITHOUT CARDIOVERSION N/A 07/05/2017   Procedure: TRANSESOPHAGEAL ECHOCARDIOGRAM (TEE);  Surgeon: Dionisio David, MD;  Location: [facility] ORS;  Service: Cardiovascular;  Laterality: N/A;   TRANSURETHRAL RESECTION OF BLADDER TUMOR N/A 08/07/2021   Procedure: TRANSURETHRAL RESECTION OF BLADDER TUMOR (TURBT);  Surgeon: Billey Co, MD;  Location: [facility] ORS;  Service: Urology;  Laterality: N/A;   URETEROSCOPY Left 08/07/2021   Procedure: DIAGNOSTIC URETEROSCOPY;  Surgeon: Billey Co, MD;  Location: [facility] ORS;  Service: Urology;  Laterality: Left;    FAMILY HISTORY: Family History  Problem Relation Age of Onset   Diabetes Brother    Hypertension Mother    Diabetes Sister     ADVANCED DIRECTIVES (Y/N):  '@ADVDIR' @  HEALTH MAINTENANCE: Social History   Tobacco Use   Smoking status: Former    Types: Cigarettes   Smokeless tobacco: Former    Types: Chew  Substance Use Topics    Alcohol use: No   Drug use: No     Colonoscopy:  PAP:  Bone density:  Lipid panel:  Allergies  Allergen Reactions   Lorazepam Shortness Of Breath and Other (See Comments)    Pt experienced adverse reaction and was transferred to ICU last time they were given Med Other reaction(s): Other (See Comments) Pt experienced adverse reaction and was transferred to ICU last time they were given Med    Current Facility-Administered Medications  Medication Dose Route Frequency Provider Last Rate Last Admin   acetaminophen (TYLENOL) tablet 650 mg  650 mg Oral Q6H PRN Para Skeans, MD       Or   acetaminophen (TYLENOL) suppository 650 mg  650 mg Rectal Q6H PRN Para Skeans, MD       acetaminophen (TYLENOL) tablet 650 mg  650 mg Oral Once Nolberto Hanlon, MD       amiodarone (PACERONE) tablet 100 mg  100 mg Oral Daily Florina Ou V, MD   100 mg at 09/16/21 0847   atorvastatin (LIPITOR) tablet 80 mg  80 mg Oral Daily Para Skeans, MD   80 mg at 09/16/21 0846   bisacodyl (DULCOLAX) EC tablet 5 mg  5 mg Oral Daily PRN Para Skeans, MD       Chlorhexidine  Gluconate Cloth 2 % PADS 6 each  6 each Topical Q0600 Para Skeans, MD   6 each at 09/15/21 2209   dextrose 5 %-0.9 % sodium chloride infusion   Intravenous Continuous Nolberto Hanlon, MD 75 mL/hr at 09/15/21 2217 New Bag at 09/15/21 2217   docusate sodium (COLACE) capsule 100 mg  100 mg Oral BID Para Skeans, MD   100 mg at 09/16/21 0847   ferric gluconate (FERRLECIT) 250 mg in sodium chloride 0.9 % 250 mL IVPB  250 mg Intravenous Daily [place] Eric J, DO 135 mL/hr at 09/16/21 1405 250 mg at 09/16/21 1405   hydrALAZINE (APRESOLINE) injection 5 mg  5 mg Intravenous Q4H PRN Para Skeans, MD       insulin aspart (novoLOG) injection 0-9 Units  0-9 Units Subcutaneous Q4H Nolberto Hanlon, MD   0 Units at 09/16/21 0512   levothyroxine (SYNTHROID) tablet 88 mcg  88 mcg Oral Q0600 Para Skeans, MD   88 mcg at 09/15/21 2342   morphine 2 MG/ML injection 2 mg  2  mg Intravenous Q2H PRN Para Skeans, MD       nicotine (NICODERM CQ - dosed in mg/24 hours) patch 14 mg  14 mg Transdermal Daily Para Skeans, MD   14 mg at 09/16/21 0846   nitroGLYCERIN (NITROSTAT) SL tablet 0.4 mg  0.4 mg Sublingual Q5 min PRN Para Skeans, MD       ondansetron [facility]) tablet 4 mg  4 mg Oral Q6H PRN Para Skeans, MD       Or   ondansetron (ZOFRAN) injection 4 mg  4 mg Intravenous Q6H PRN Para Skeans, MD       oxyCODONE (Oxy IR/ROXICODONE) immediate release tablet 5 mg  5 mg Oral Q4H PRN Para Skeans, MD       pantoprozole (PROTONIX) 80 mg /NS 100 mL infusion  8 mg/hr Intravenous Continuous Para Skeans, MD 10 mL/hr at 09/16/21 1122 8 mg/hr at 09/16/21 1122   patiromer (VELTASSA) packet 16.8 g  16.8 g Oral Daily Harvest Dark, MD   16.8 g at 09/16/21 1150   pneumococcal 23 valent vaccine (PNEUMOVAX-23) injection 0.5 mL  0.5 mL Intramuscular Tomorrow-1000 Florina Ou V, MD       polyethylene glycol (MIRALAX / GLYCOLAX) packet 17 g  17 g Oral Daily PRN Para Skeans, MD       sodium chloride flush (NS) 0.9 % injection 3 mL  3 mL Intravenous Q12H Florina Ou V, MD   3 mL at 09/16/21 0852   traZODone (DESYREL) tablet 25 mg  25 mg Oral QHS PRN Para Skeans, MD        OBJECTIVE: Vitals:   09/16/21 1400 09/16/21 1500  BP: 118/73 127/66  Pulse: 63 60  Resp: 19 18  Temp:    SpO2: 98% 91%     Body mass index is 22.82 kg/m.    ECOG FS:2 - Symptomatic, <50% confined to bed  General: Well-developed, well-nourished, no acute distress. Eyes: Pink conjunctiva, anicteric sclera. HEENT: Normocephalic, moist mucous membranes. Lungs: No audible wheezing or coughing. Heart: Regular rate and rhythm. Abdomen: Soft, nontender, no obvious distention. Musculoskeletal: No edema, cyanosis, or clubbing. Neuro: Alert, answering all questions appropriately. Cranial nerves grossly intact. Skin: No rashes or petechiae noted. Psych: Normal affect. Lymphatics: No cervical,  calvicular, axillary or inguinal LAD.   LAB RESULTS:  Lab Results  Component Value Date   NA 133 (L)  09/16/2021   K 4.5 09/16/2021   CL 108 09/16/2021   CO2 17 (L) 09/16/2021   GLUCOSE 139 (H) 09/16/2021   BUN 32 (H) 09/16/2021   CREATININE 1.79 (H) 09/16/2021   CALCIUM 8.7 (L) 09/16/2021   PROT 7.0 09/14/2021   ALBUMIN 3.5 09/14/2021   AST 34 09/14/2021   ALT 22 09/14/2021   ALKPHOS 68 09/14/2021   BILITOT 0.9 09/14/2021   GFRNONAA 39 (L) 09/16/2021   GFRAA >60 07/04/2017    Lab Results  Component Value Date   WBC 4.9 09/16/2021   NEUTROABS 6.7 09/14/2021   HGB 8.1 (L) 09/16/2021   HCT 25.1 (L) 09/16/2021   MCV 90.3 09/16/2021   PLT 138 (L) 09/16/2021   Lab Results  Component Value Date   IRON 42 (L) 09/15/2021   TIBC 301 09/15/2021   IRONPCTSAT 14 (L) 09/15/2021   Lab Results  Component Value Date   FERRITIN 46 03/11/2021     STUDIES: DG Chest 2 View  Result Date: 09/14/2021 CLINICAL DATA:  Shortness of breath, chest pain, weakness, and nausea for 3 days. EXAM: CHEST - 2 VIEW COMPARISON:  07/24/2021 FINDINGS: Postoperative changes in the mediastinum. Cardiac pacemaker. Mild cardiac enlargement. No vascular congestion. There is evidence of peribronchial thickening with central interstitial pattern to the lungs probably representing bronchiectasis and chronic bronchitis. No developing consolidation or edema. Minimal fluid or thickened pleura along the left costophrenic angle is unchanged since prior study. No pneumothorax. Calcification of the aorta. IMPRESSION: Chronic bronchitic changes in the lungs. Mild fluid or thickened pleura in the left costophrenic angle. No developing consolidation or edema. Electronically Signed   By: Lucienne Capers M.D.   On: 09/14/2021 21:40   ECHOCARDIOGRAM COMPLETE  Result Date: 09/15/2021    ECHOCARDIOGRAM REPORT   Patient Name:   Walter Blair Date of Exam: 09/15/2021 Medical Rec #:  [id number]      Height:       64.0 in  Accession #:    [id number]     Weight:       123.9 lb Date of Birth:  [birth year]       BSA:          1.596 m Patient Age:    47 years       BP:           100/62 mmHg Patient Gender: M              HR:           58 bpm. Exam Location:  [facility] Procedure: 2D Echo, Cardiac Doppler and Color Doppler Indications:     CHF-acute systolic 286.38 / T77.11  History:         Patient has prior history of Echocardiogram examinations, most                  recent 07/25/2021. Prior CABG, Arrythmias:Atrial Fibrillation;                  Risk Factors:Diabetes and Hypertension. AICD.  Sonographer:     Sherrie Sport Referring Phys:  [place] Diagnosing Phys: [place]  1. Left ventricular ejection fraction, by estimation, is <20%. The left ventricle has severely decreased function. The left ventricle demonstrates global hypokinesis. The left ventricular internal cavity size was severely dilated. Left ventricular diastolic parameters are consistent with Grade II diastolic dysfunction (pseudonormalization).  2. Right ventricular systolic function is normal. The right ventricular size is severely enlarged.  3.  Left atrial size was severely dilated.  4. Right atrial size was severely dilated.  5. The mitral valve is normal in structure. Moderate to severe mitral valve regurgitation. No evidence of mitral stenosis.  6. Tricuspid valve regurgitation is moderate.  7. The aortic valve is normal in structure. Aortic valve regurgitation is not visualized. No aortic stenosis is present.  8. The inferior vena cava is normal in size with greater than 50% respiratory variability, suggesting right atrial pressure of 3 mmHg. Conclusion(s)/Recommendation(s): Findings consistent with ischemic cardiomyopathy. FINDINGS  Left Ventricle: Left ventricular ejection fraction, by estimation, is <20%. The left ventricle has severely decreased function. The left ventricle demonstrates global hypokinesis. The left ventricular internal cavity  size was severely dilated. There is no left ventricular hypertrophy. Left ventricular diastolic parameters are consistent with Grade II diastolic dysfunction (pseudonormalization). Right Ventricle: The right ventricular size is severely enlarged. No increase in right ventricular wall thickness. Right ventricular systolic function is normal. Left Atrium: Left atrial size was severely dilated. Right Atrium: Right atrial size was severely dilated. Pericardium: There is no evidence of pericardial effusion. Mitral Valve: The mitral valve is normal in structure. Moderate to severe mitral valve regurgitation. No evidence of mitral valve stenosis. MV peak gradient, 8.8 mmHg. The mean mitral valve gradient is 4.0 mmHg. Tricuspid Valve: The tricuspid valve is normal in structure. Tricuspid valve regurgitation is moderate . No evidence of tricuspid stenosis. Aortic Valve: The aortic valve is normal in structure. Aortic valve regurgitation is not visualized. No aortic stenosis is present. Aortic valve mean gradient measures 2.0 mmHg. Aortic valve peak gradient measures 3.7 mmHg. Aortic valve area, by VTI measures 1.81 cm. Pulmonic Valve: The pulmonic valve was normal in structure. Pulmonic valve regurgitation is mild. No evidence of pulmonic stenosis. Aorta: The aortic root is normal in size and structure. Venous: The inferior vena cava is normal in size with greater than 50% respiratory variability, suggesting right atrial pressure of 3 mmHg. IAS/Shunts: No atrial level shunt detected by color flow Doppler.  LEFT VENTRICLE PLAX 2D LVIDd:         5.40 cm LVIDs:         4.90 cm LV PW:         1.20 cm LV IVS:        0.60 cm LVOT diam:     2.00 cm LV SV:         29 LV SV Index:   18 LVOT Area:     3.14 cm  LV Volumes (MOD) LV vol d, MOD A2C: 153.0 ml LV vol d, MOD A4C: 168.0 ml LV vol s, MOD A2C: 113.0 ml LV vol s, MOD A4C: 128.0 ml LV SV MOD A2C:     40.0 ml LV SV MOD A4C:     168.0 ml LV SV MOD BP:      40.6 ml RIGHT VENTRICLE  RV Basal diam:  5.60 cm RV S prime:     9.36 cm/s TAPSE (M-mode): 4.1 cm LEFT ATRIUM             Index        RIGHT ATRIUM           Index LA diam:        4.60 cm 2.88 cm/m   RA Area:     27.70 cm LA Vol (A2C):   86.1 ml 53.94 ml/m  RA Volume:   97.40 ml  61.02 ml/m LA Vol (A4C):   90.2 ml 56.51 ml/m  LA Biplane Vol: 91.1 ml 57.08 ml/m  AORTIC VALVE                    PULMONIC VALVE AV Area (Vmax):    1.40 cm     PV Vmax:        0.48 m/s AV Area (Vmean):   1.50 cm     PV Vmean:       33.300 cm/s AV Area (VTI):     1.81 cm     PV VTI:         0.078 m AV Vmax:           96.65 cm/s   PV Peak grad:   0.9 mmHg AV Vmean:          62.900 cm/s  PV Mean grad:   1.0 mmHg AV VTI:            0.160 m      RVOT Peak grad: 4 mmHg AV Peak Grad:      3.7 mmHg AV Mean Grad:      2.0 mmHg LVOT Vmax:         43.10 cm/s LVOT Vmean:        30.000 cm/s LVOT VTI:          0.092 m LVOT/AV VTI ratio: 0.58  AORTA Ao Root diam: 3.00 cm MITRAL VALVE                TRICUSPID VALVE MV Area (PHT): 6.27 cm     TR Peak grad:   37.0 mmHg MV Area VTI:   1.53 cm     TR Vmax:        304.00 cm/s MV Peak grad:  8.8 mmHg MV Mean grad:  4.0 mmHg     SHUNTS MV Vmax:       1.48 m/s     Systemic VTI:  0.09 m MV Vmean:      94.4 cm/s    Systemic Diam: 2.00 cm MV Decel Time: 121 msec     Pulmonic VTI:  0.180 m MV E velocity: 114.00 cm/s Neoma Laming Electronically signed by Neoma Laming Signature Date/Time: 09/15/2021/1:16:32 PM    Final     ASSESSMENT: Symptomatic anemia.  PLAN:    Symptomatic anemia: Possibly GI source and patient is undergoing EGD tomorrow.  Patient has reduced iron stores and a normal B12 and folate level.  There is no evidence of hemolysis.  Agree with transfusion and maintain hemoglobin greater than 7.0.  Patient also receiving IV iron today.  Appreciate GI input.  Can consider bone marrow biopsy in the future, but this is not necessary at this time and can be accomplished as an outpatient. Thrombocytopenia: Mild.  No  evidence of DIC. Acute on chronic renal failure: Patient's creatinine has improved to 1.79 with blood transfusion. Coagulopathy: Patient's INR was 2.1 upon admission.  Eliquis has been discontinued permanently by cardiology.  Appreciate consult, will follow.   Lloyd Huger, MD   09/16/2021 3:35 PM

## 2021-09-16 NOTE — Progress Notes (Addendum)
Walter Antigua, MD [address], [place], [place], [place], [address] [address], [place], [place], [place], [address] Phone: [phone]  Fax: [phone]   Subjective:  Patient resting in bed comfortably.  Denies any abdominal pain.  Denies any further bowel movements.  Family at bedside.  Objective: Exam: Vital signs in last 24 hours: Vitals:   09/16/21 0700 09/16/21 0800 09/16/21 0900 09/16/21 1000  BP: 100/67  (!) 123/59   Pulse: (!) 59 60 60 65  Resp: 15 19 14  (!) 22  Temp:   (!) 97.5 F (36.4 C)   TempSrc:   Oral   SpO2: 99% 100% 100% 97%  Weight:      Height:       Weight change: 3.8 kg  Intake/Output Summary (Last 24 hours) at 09/16/2021 1218 Last data filed at 09/16/2021 1000 Gross per 24 hour  Intake 4065.99 ml  Output 1550 ml  Net 2515.99 ml    Constitutional: General:   Alert,  Well-developed, well-nourished, pleasant and cooperative in NAD BP (!) 123/59    Pulse 65    Temp (!) 97.5 F (36.4 C) (Oral)    Resp (!) 22    Ht 5\' 4"  (1.626 m)    Wt 60.3 kg    SpO2 97%    BMI 22.82 kg/m   Eyes:  Sclera clear, no icterus.   Conjunctiva pink.   Ears:  No scars, lesions or masses, Normal auditory acuity. Nose:  No deformity, discharge, or lesions. Mouth:  No deformity or lesions, oropharynx pink & moist.  Neck:  Supple; no masses, trachea midline  Respiratory: Normal respiratory effort  Gastrointestinal:  Soft, non-tender and non-distended without masses, hepatosplenomegaly or hernias noted.  No guarding or rebound tenderness.     Cardiac: No clubbing or edema.  No cyanosis. Normal posterior tibial pedal pulses noted.  Lymphatic:  No significant cervical adenopathy.  Psych:  Alert and cooperative. Normal mood and affect.  Musculoskeletal:  Head normocephalic, atraumatic. 5/5 Lower extremity strength bilaterally.  Skin: Warm. Intact without significant lesions or rashes. No jaundice.  Neurologic:  Face symmetrical, tongue midline,  Normal sensation to touch  Psych:  Alert and oriented x3, Alert and cooperative. Normal mood and affect.   Lab Results: Lab Results  Component Value Date   WBC 4.9 09/16/2021   HGB 8.1 (L) 09/16/2021   HCT 25.1 (L) 09/16/2021   MCV 90.3 09/16/2021   PLT 138 (L) 09/16/2021   Micro Results: Recent Results (from the past 240 hour(s))  Resp Panel by RT-PCR (Flu A&B, Covid) Nasopharyngeal Swab     Status: None   Collection Time: 09/14/21  8:38 PM   Specimen: Nasopharyngeal Swab; Nasopharyngeal(NP) swabs in vial transport medium  Result Value Ref Range Status   SARS Coronavirus 2 by RT PCR NEGATIVE NEGATIVE Final    Comment: (NOTE) SARS-CoV-2 target nucleic acids are NOT DETECTED.  The SARS-CoV-2 RNA is generally detectable in upper respiratory specimens during the acute phase of infection. The lowest concentration of SARS-CoV-2 viral copies this assay can detect is 138 copies/mL. A negative result does not preclude SARS-Cov-2 infection and should not be used as the sole basis for treatment or other patient management decisions. A negative result may occur with  improper specimen collection/handling, submission of specimen other than nasopharyngeal swab, presence of viral mutation(s) within the areas targeted by this assay, and inadequate number of viral copies(<138 copies/mL). A negative result must be combined with clinical observations, patient history, and epidemiological information.  The expected result is Negative.  Fact Sheet for Patients:  EntrepreneurPulse.com.au  Fact Sheet for Healthcare Providers:  IncredibleEmployment.be  This test is no t yet approved or cleared by the [place] FDA and  has been authorized for detection and/or diagnosis of SARS-CoV-2 by FDA under an Emergency Use Authorization (EUA). This EUA will remain  in effect (meaning this test can be used) for the duration of the COVID-19 declaration under Section  564(b)(1) of the Act, 21 U.S.C.section 360bbb-3(b)(1), unless the authorization is terminated  or revoked sooner.       Influenza A by PCR NEGATIVE NEGATIVE Final   Influenza B by PCR NEGATIVE NEGATIVE Final    Comment: (NOTE) The Xpert Xpress SARS-CoV-2/FLU/RSV plus assay is intended as an aid in the diagnosis of influenza from Nasopharyngeal swab specimens and should not be used as a sole basis for treatment. Nasal washings and aspirates are unacceptable for Xpert Xpress SARS-CoV-2/FLU/RSV testing.  Fact Sheet for Patients: EntrepreneurPulse.com.au  Fact Sheet for Healthcare Providers: IncredibleEmployment.be  This test is not yet approved or cleared by the [place] FDA and has been authorized for detection and/or diagnosis of SARS-CoV-2 by FDA under an Emergency Use Authorization (EUA). This EUA will remain in effect (meaning this test can be used) for the duration of the COVID-19 declaration under Section 564(b)(1) of the Act, 21 U.S.C. section 360bbb-3(b)(1), unless the authorization is terminated or revoked.  Performed at [facility], [address] [place], [place] [address]   MRSA Next Gen by PCR, Nasal     Status: None   Collection Time: 09/15/21 10:15 PM   Specimen: Nasal Mucosa; Nasal Swab  Result Value Ref Range Status   MRSA by PCR Next Gen NOT DETECTED NOT DETECTED Final    Comment: (NOTE) The GeneXpert MRSA Assay (FDA approved for NASAL specimens only), is one component of a comprehensive MRSA colonization surveillance program. It is not intended to diagnose MRSA infection nor to guide or monitor treatment for MRSA infections. Test performance is not FDA approved in patients less than 23 years old. Performed at [facility], [address] [place], [place] [address]    Studies/Results: DG Chest 2 View  Result Date: 09/14/2021 CLINICAL DATA:  Shortness of breath, chest pain, weakness, and  nausea for 3 days. EXAM: CHEST - 2 VIEW COMPARISON:  07/24/2021 FINDINGS: Postoperative changes in the mediastinum. Cardiac pacemaker. Mild cardiac enlargement. No vascular congestion. There is evidence of peribronchial thickening with central interstitial pattern to the lungs probably representing bronchiectasis and chronic bronchitis. No developing consolidation or edema. Minimal fluid or thickened pleura along the left costophrenic angle is unchanged since prior study. No pneumothorax. Calcification of the aorta. IMPRESSION: Chronic bronchitic changes in the lungs. Mild fluid or thickened pleura in the left costophrenic angle. No developing consolidation or edema. Electronically Signed   By: Lucienne Capers M.D.   On: 09/14/2021 21:40   ECHOCARDIOGRAM COMPLETE  Result Date: 09/15/2021    ECHOCARDIOGRAM REPORT   Patient Name:   Walter Blair Date of Exam: 09/15/2021 Medical Rec #:  [id number]      Height:       64.0 in Accession #:    [id number]     Weight:       123.9 lb Date of Birth:  [birth year]       BSA:          1.596 m Patient Age:    75 years       BP:  100/62 mmHg Patient Gender: M              HR:           58 bpm. Exam Location:  [facility] Procedure: 2D Echo, Cardiac Doppler and Color Doppler Indications:     CHF-acute systolic 387.56 / E33.29  History:         Patient has prior history of Echocardiogram examinations, most                  recent 07/25/2021. Prior CABG, Arrythmias:Atrial Fibrillation;                  Risk Factors:Diabetes and Hypertension. AICD.  Sonographer:     Sherrie Sport Referring Phys:  [place] Diagnosing Phys: [place]  1. Left ventricular ejection fraction, by estimation, is <20%. The left ventricle has severely decreased function. The left ventricle demonstrates global hypokinesis. The left ventricular internal cavity size was severely dilated. Left ventricular diastolic parameters are consistent with Grade II diastolic dysfunction  (pseudonormalization).  2. Right ventricular systolic function is normal. The right ventricular size is severely enlarged.  3. Left atrial size was severely dilated.  4. Right atrial size was severely dilated.  5. The mitral valve is normal in structure. Moderate to severe mitral valve regurgitation. No evidence of mitral stenosis.  6. Tricuspid valve regurgitation is moderate.  7. The aortic valve is normal in structure. Aortic valve regurgitation is not visualized. No aortic stenosis is present.  8. The inferior vena cava is normal in size with greater than 50% respiratory variability, suggesting right atrial pressure of 3 mmHg. Conclusion(s)/Recommendation(s): Findings consistent with ischemic cardiomyopathy. FINDINGS  Left Ventricle: Left ventricular ejection fraction, by estimation, is <20%. The left ventricle has severely decreased function. The left ventricle demonstrates global hypokinesis. The left ventricular internal cavity size was severely dilated. There is no left ventricular hypertrophy. Left ventricular diastolic parameters are consistent with Grade II diastolic dysfunction (pseudonormalization). Right Ventricle: The right ventricular size is severely enlarged. No increase in right ventricular wall thickness. Right ventricular systolic function is normal. Left Atrium: Left atrial size was severely dilated. Right Atrium: Right atrial size was severely dilated. Pericardium: There is no evidence of pericardial effusion. Mitral Valve: The mitral valve is normal in structure. Moderate to severe mitral valve regurgitation. No evidence of mitral valve stenosis. MV peak gradient, 8.8 mmHg. The mean mitral valve gradient is 4.0 mmHg. Tricuspid Valve: The tricuspid valve is normal in structure. Tricuspid valve regurgitation is moderate . No evidence of tricuspid stenosis. Aortic Valve: The aortic valve is normal in structure. Aortic valve regurgitation is not visualized. No aortic stenosis is present. Aortic  valve mean gradient measures 2.0 mmHg. Aortic valve peak gradient measures 3.7 mmHg. Aortic valve area, by VTI measures 1.81 cm. Pulmonic Valve: The pulmonic valve was normal in structure. Pulmonic valve regurgitation is mild. No evidence of pulmonic stenosis. Aorta: The aortic root is normal in size and structure. Venous: The inferior vena cava is normal in size with greater than 50% respiratory variability, suggesting right atrial pressure of 3 mmHg. IAS/Shunts: No atrial level shunt detected by color flow Doppler.  LEFT VENTRICLE PLAX 2D LVIDd:         5.40 cm LVIDs:         4.90 cm LV PW:         1.20 cm LV IVS:        0.60 cm LVOT diam:  2.00 cm LV SV:         29 LV SV Index:   18 LVOT Area:     3.14 cm  LV Volumes (MOD) LV vol d, MOD A2C: 153.0 ml LV vol d, MOD A4C: 168.0 ml LV vol s, MOD A2C: 113.0 ml LV vol s, MOD A4C: 128.0 ml LV SV MOD A2C:     40.0 ml LV SV MOD A4C:     168.0 ml LV SV MOD BP:      40.6 ml RIGHT VENTRICLE RV Basal diam:  5.60 cm RV S prime:     9.36 cm/s TAPSE (M-mode): 4.1 cm LEFT ATRIUM             Index        RIGHT ATRIUM           Index LA diam:        4.60 cm 2.88 cm/m   RA Area:     27.70 cm LA Vol (A2C):   86.1 ml 53.94 ml/m  RA Volume:   97.40 ml  61.02 ml/m LA Vol (A4C):   90.2 ml 56.51 ml/m LA Biplane Vol: 91.1 ml 57.08 ml/m  AORTIC VALVE                    PULMONIC VALVE AV Area (Vmax):    1.40 cm     PV Vmax:        0.48 m/s AV Area (Vmean):   1.50 cm     PV Vmean:       33.300 cm/s AV Area (VTI):     1.81 cm     PV VTI:         0.078 m AV Vmax:           96.65 cm/s   PV Peak grad:   0.9 mmHg AV Vmean:          62.900 cm/s  PV Mean grad:   1.0 mmHg AV VTI:            0.160 m      RVOT Peak grad: 4 mmHg AV Peak Grad:      3.7 mmHg AV Mean Grad:      2.0 mmHg LVOT Vmax:         43.10 cm/s LVOT Vmean:        30.000 cm/s LVOT VTI:          0.092 m LVOT/AV VTI ratio: 0.58  AORTA Ao Root diam: 3.00 cm MITRAL VALVE                TRICUSPID VALVE MV Area (PHT): 6.27 cm      TR Peak grad:   37.0 mmHg MV Area VTI:   1.53 cm     TR Vmax:        304.00 cm/s MV Peak grad:  8.8 mmHg MV Mean grad:  4.0 mmHg     SHUNTS MV Vmax:       1.48 m/s     Systemic VTI:  0.09 m MV Vmean:      94.4 cm/s    Systemic Diam: 2.00 cm MV Decel Time: 121 msec     Pulmonic VTI:  0.180 m MV E velocity: 114.00 cm/s Neoma Laming Electronically signed by Neoma Laming Signature Date/Time: 09/15/2021/1:16:32 PM    Final    Medications:  Scheduled Meds:  acetaminophen  650 mg Oral Once   amiodarone  100 mg Oral Daily  atorvastatin  80 mg Oral Daily   Chlorhexidine Gluconate Cloth  6 each Topical Q0600   docusate sodium  100 mg Oral BID   furosemide  40 mg Intravenous BID   insulin aspart  0-9 Units Subcutaneous Q4H   levothyroxine  88 mcg Oral Q0600   nicotine  14 mg Transdermal Daily   patiromer  16.8 g Oral Daily   pneumococcal 23 valent vaccine  0.5 mL Intramuscular Tomorrow-1000   sodium chloride flush  3 mL Intravenous Q12H   Continuous Infusions:  dextrose 5 % and 0.9% NaCl 75 mL/hr at 09/15/21 2217   pantoprazole 8 mg/hr (09/16/21 1122)   PRN Meds:.acetaminophen **OR** acetaminophen, bisacodyl, hydrALAZINE, morphine injection, nitroGLYCERIN, ondansetron **OR** ondansetron (ZOFRAN) IV, oxyCODONE, polyethylene glycol, traZODone   Assessment: Principal Problem:   Generalized weakness Active Problems:   Diabetes (HCC)   HTN (hypertension)   CAD (coronary artery disease)   SOB (shortness of breath)   IDA (iron deficiency anemia)   Acute on chronic systolic CHF (congestive heart failure) (HCC)   Atrial fibrillation, chronic (HCC)   Acute renal failure superimposed on stage 3a chronic kidney disease ([place])   HLD (hyperlipidemia)   Hypothyroid   Melena   Chest pain   Hypoglycemia    Plan: Hemoglobin has improved post PRBC transfusion  Given melena at home and acute decline in hemoglobin, can proceed with EGD to reevaluate his upper GI tract for any concerning  lesions  Whether or not he would benefit from colonoscopy is questionable given that he had work-up for the same symptoms earlier this year without any significant findings in his colon to explain his symptoms  Cardiology has cleared the patient and will proceed with EGD after he has been n.p.o.  He ate solid food today  EGD tomorrow  Patient and family would like to proceed with endoscopy as well  Consider IV iron infusion as an inpatient as well  I have discussed alternative options, risks & benefits,  which include, but are not limited to, bleeding, infection, perforation,respiratory complication & drug reaction.  The patient agrees with this plan & written consent will be obtained.       LOS: 2 days   Walter Antigua, MD 09/16/2021, 12:18 PM

## 2021-09-16 NOTE — Progress Notes (Signed)
[facility]  ROUNDING NOTE   Subjective:   Patient laying in bed comfortably this morning. He denies any complaints.   Completed 2 units PRBC transfusion yesterday.   GI is planning on endoscopy   Objective:  Vital signs in last 24 hours:  Temp:  [97.4 F (36.3 C)-97.8 F (36.6 C)] 97.5 F (36.4 C) (12/28 0900) Pulse Rate:  [57-65] 65 (12/28 1000) Resp:  [13-22] 22 (12/28 1000) BP: (66-124)/(50-76) 123/59 (12/28 0900) SpO2:  [94 %-100 %] 97 % (12/28 1000) Weight:  [60 kg-60.3 kg] 60.3 kg (12/28 0500)  Weight change: 3.8 kg Filed Weights   09/14/21 2032 09/15/21 2205 09/16/21 0500  Weight: 56.2 kg 60 kg 60.3 kg    Intake/Output: I/O last 3 completed shifts: In: 35 [I.V.:3306; Blood:1260; IV Piggyback:250] Out: 4132 [Urine:1550]   Intake/Output this shift:  Total I/O In: 200 [P.O.:200] Out: -   Physical Exam: General: NAD, laying in bed  Head: Normocephalic, atraumatic. Moist oral mucosal membranes  Eyes: Anicteric, PERRL  Neck: Supple, trachea midline  Lungs:  Clear to auscultation  Heart: Regular rate and rhythm  Abdomen:  Soft, nontender,   Extremities:  No peripheral edema.  Neurologic: Nonfocal, moving all four extremities  Skin: No lesions       Basic Metabolic Panel: Recent Labs  Lab 09/14/21 2038 09/15/21 0702 09/16/21 1133  NA 132* 131* 133*  K 5.7* 5.3* 4.5  CL 105 109 108  CO2 18* 16* 17*  GLUCOSE 52* 56* 139*  BUN 56* 50* 32*  CREATININE 2.41* 2.23* 1.79*  CALCIUM 9.3 8.1* 8.7*     Liver Function Tests: Recent Labs  Lab 09/14/21 2038  AST 34  ALT 22  ALKPHOS 68  BILITOT 0.9  PROT 7.0  ALBUMIN 3.5    No results for input(s): LIPASE, AMYLASE in the last 168 hours. No results for input(s): AMMONIA in the last 168 hours.  CBC: Recent Labs  Lab 09/14/21 2038 09/15/21 0702 09/15/21 2047 09/16/21 0830  WBC 7.4 4.7  --  4.9  NEUTROABS 6.7  --   --   --   HGB 6.5* 7.0* 8.2* 8.1*  HCT 20.5* 21.7* 25.6*  25.1*  MCV 95.8 90.4  --  90.3  PLT 214 132*  --  138*     Cardiac Enzymes: No results for input(s): CKTOTAL, CKMB, CKMBINDEX, TROPONINI in the last 168 hours.  BNP: Invalid input(s): POCBNP  CBG: Recent Labs  Lab 09/15/21 2046 09/15/21 2208 09/16/21 0503 09/16/21 0726 09/16/21 1156  GLUCAP 87 95 123* 96 118*     Microbiology: Results for orders placed or performed during the hospital encounter of 09/14/21  Resp Panel by RT-PCR (Flu A&B, Covid) Nasopharyngeal Swab     Status: None   Collection Time: 09/14/21  8:38 PM   Specimen: Nasopharyngeal Swab; Nasopharyngeal(NP) swabs in vial transport medium  Result Value Ref Range Status   SARS Coronavirus 2 by RT PCR NEGATIVE NEGATIVE Final    Comment: (NOTE) SARS-CoV-2 target nucleic acids are NOT DETECTED.  The SARS-CoV-2 RNA is generally detectable in upper respiratory specimens during the acute phase of infection. The lowest concentration of SARS-CoV-2 viral copies this assay can detect is 138 copies/mL. A negative result does not preclude SARS-Cov-2 infection and should not be used as the sole basis for treatment or other patient management decisions. A negative result may occur with  improper specimen collection/handling, submission of specimen other than nasopharyngeal swab, presence of viral mutation(s) within the areas targeted by  this assay, and inadequate number of viral copies(<138 copies/mL). A negative result must be combined with clinical observations, patient history, and epidemiological information. The expected result is Negative.  Fact Sheet for Patients:  EntrepreneurPulse.com.au  Fact Sheet for Healthcare Providers:  IncredibleEmployment.be  This test is no t yet approved or cleared by the [place] FDA and  has been authorized for detection and/or diagnosis of SARS-CoV-2 by FDA under an Emergency Use Authorization (EUA). This EUA will remain  in effect  (meaning this test can be used) for the duration of the COVID-19 declaration under Section 564(b)(1) of the Act, 21 U.S.C.section 360bbb-3(b)(1), unless the authorization is terminated  or revoked sooner.       Influenza A by PCR NEGATIVE NEGATIVE Final   Influenza B by PCR NEGATIVE NEGATIVE Final    Comment: (NOTE) The Xpert Xpress SARS-CoV-2/FLU/RSV plus assay is intended as an aid in the diagnosis of influenza from Nasopharyngeal swab specimens and should not be used as a sole basis for treatment. Nasal washings and aspirates are unacceptable for Xpert Xpress SARS-CoV-2/FLU/RSV testing.  Fact Sheet for Patients: EntrepreneurPulse.com.au  Fact Sheet for Healthcare Providers: IncredibleEmployment.be  This test is not yet approved or cleared by the [place] FDA and has been authorized for detection and/or diagnosis of SARS-CoV-2 by FDA under an Emergency Use Authorization (EUA). This EUA will remain in effect (meaning this test can be used) for the duration of the COVID-19 declaration under Section 564(b)(1) of the Act, 21 U.S.C. section 360bbb-3(b)(1), unless the authorization is terminated or revoked.  Performed at [facility], [address] [place], [place] [address]   MRSA Next Gen by PCR, Nasal     Status: None   Collection Time: 09/15/21 10:15 PM   Specimen: Nasal Mucosa; Nasal Swab  Result Value Ref Range Status   MRSA by PCR Next Gen NOT DETECTED NOT DETECTED Final    Comment: (NOTE) The GeneXpert MRSA Assay (FDA approved for NASAL specimens only), is one component of a comprehensive MRSA colonization surveillance program. It is not intended to diagnose MRSA infection nor to guide or monitor treatment for MRSA infections. Test performance is not FDA approved in patients less than 59 years old. Performed at [facility], [address] [place], [place] [address]     Coagulation Studies: Recent  Labs    09/15/21 1828  LABPROT 23.9*  INR 2.1*    Urinalysis: Recent Labs    09/14/21 0727  COLORURINE YELLOW  LABSPEC 1.015  PHURINE 5.0  GLUCOSEU NEGATIVE  HGBUR NEGATIVE  BILIRUBINUR NEGATIVE  KETONESUR NEGATIVE  PROTEINUR 30*  NITRITE NEGATIVE  LEUKOCYTESUR SMALL*       Imaging: DG Chest 2 View  Result Date: 09/14/2021 CLINICAL DATA:  Shortness of breath, chest pain, weakness, and nausea for 3 days. EXAM: CHEST - 2 VIEW COMPARISON:  07/24/2021 FINDINGS: Postoperative changes in the mediastinum. Cardiac pacemaker. Mild cardiac enlargement. No vascular congestion. There is evidence of peribronchial thickening with central interstitial pattern to the lungs probably representing bronchiectasis and chronic bronchitis. No developing consolidation or edema. Minimal fluid or thickened pleura along the left costophrenic angle is unchanged since prior study. No pneumothorax. Calcification of the aorta. IMPRESSION: Chronic bronchitic changes in the lungs. Mild fluid or thickened pleura in the left costophrenic angle. No developing consolidation or edema. Electronically Signed   By: Lucienne Capers M.D.   On: 09/14/2021 21:40   ECHOCARDIOGRAM COMPLETE  Result Date: 09/15/2021    ECHOCARDIOGRAM REPORT  Patient Name:   ILYAAS MUSTO Date of Exam: 09/15/2021 Medical Rec #:  [id number]      Height:       64.0 in Accession #:    [id number]     Weight:       123.9 lb Date of Birth:  August 09, 1946       BSA:          1.596 m Patient Age:    66 years       BP:           100/62 mmHg Patient Gender: M              HR:           58 bpm. Exam Location:  [facility] Procedure: 2D Echo, Cardiac Doppler and Color Doppler Indications:     CHF-acute systolic 253.66 / Y40.34  History:         Patient has prior history of Echocardiogram examinations, most                  recent 07/25/2021. Prior CABG, Arrythmias:Atrial Fibrillation;                  Risk Factors:Diabetes and Hypertension. AICD.  Sonographer:     Sherrie Sport Referring Phys:  [place] Diagnosing Phys: [place]  1. Left ventricular ejection fraction, by estimation, is <20%. The left ventricle has severely decreased function. The left ventricle demonstrates global hypokinesis. The left ventricular internal cavity size was severely dilated. Left ventricular diastolic parameters are consistent with Grade II diastolic dysfunction (pseudonormalization).  2. Right ventricular systolic function is normal. The right ventricular size is severely enlarged.  3. Left atrial size was severely dilated.  4. Right atrial size was severely dilated.  5. The mitral valve is normal in structure. Moderate to severe mitral valve regurgitation. No evidence of mitral stenosis.  6. Tricuspid valve regurgitation is moderate.  7. The aortic valve is normal in structure. Aortic valve regurgitation is not visualized. No aortic stenosis is present.  8. The inferior vena cava is normal in size with greater than 50% respiratory variability, suggesting right atrial pressure of 3 mmHg. Conclusion(s)/Recommendation(s): Findings consistent with ischemic cardiomyopathy. FINDINGS  Left Ventricle: Left ventricular ejection fraction, by estimation, is <20%. The left ventricle has severely decreased function. The left ventricle demonstrates global hypokinesis. The left ventricular internal cavity size was severely dilated. There is no left ventricular hypertrophy. Left ventricular diastolic parameters are consistent with Grade II diastolic dysfunction (pseudonormalization). Right Ventricle: The right ventricular size is severely enlarged. No increase in right ventricular wall thickness. Right ventricular systolic function is normal. Left Atrium: Left atrial size was severely dilated. Right Atrium: Right atrial size was severely dilated. Pericardium: There is no evidence of pericardial effusion. Mitral Valve: The mitral valve is normal in structure. Moderate to severe mitral valve  regurgitation. No evidence of mitral valve stenosis. MV peak gradient, 8.8 mmHg. The mean mitral valve gradient is 4.0 mmHg. Tricuspid Valve: The tricuspid valve is normal in structure. Tricuspid valve regurgitation is moderate . No evidence of tricuspid stenosis. Aortic Valve: The aortic valve is normal in structure. Aortic valve regurgitation is not visualized. No aortic stenosis is present. Aortic valve mean gradient measures 2.0 mmHg. Aortic valve peak gradient measures 3.7 mmHg. Aortic valve area, by VTI measures 1.81 cm. Pulmonic Valve: The pulmonic valve was normal in structure. Pulmonic valve regurgitation is mild. No evidence of pulmonic stenosis. Aorta: The aortic  root is normal in size and structure. Venous: The inferior vena cava is normal in size with greater than 50% respiratory variability, suggesting right atrial pressure of 3 mmHg. IAS/Shunts: No atrial level shunt detected by color flow Doppler.  LEFT VENTRICLE PLAX 2D LVIDd:         5.40 cm LVIDs:         4.90 cm LV PW:         1.20 cm LV IVS:        0.60 cm LVOT diam:     2.00 cm LV SV:         29 LV SV Index:   18 LVOT Area:     3.14 cm  LV Volumes (MOD) LV vol d, MOD A2C: 153.0 ml LV vol d, MOD A4C: 168.0 ml LV vol s, MOD A2C: 113.0 ml LV vol s, MOD A4C: 128.0 ml LV SV MOD A2C:     40.0 ml LV SV MOD A4C:     168.0 ml LV SV MOD BP:      40.6 ml RIGHT VENTRICLE RV Basal diam:  5.60 cm RV S prime:     9.36 cm/s TAPSE (M-mode): 4.1 cm LEFT ATRIUM             Index        RIGHT ATRIUM           Index LA diam:        4.60 cm 2.88 cm/m   RA Area:     27.70 cm LA Vol (A2C):   86.1 ml 53.94 ml/m  RA Volume:   97.40 ml  61.02 ml/m LA Vol (A4C):   90.2 ml 56.51 ml/m LA Biplane Vol: 91.1 ml 57.08 ml/m  AORTIC VALVE                    PULMONIC VALVE AV Area (Vmax):    1.40 cm     PV Vmax:        0.48 m/s AV Area (Vmean):   1.50 cm     PV Vmean:       33.300 cm/s AV Area (VTI):     1.81 cm     PV VTI:         0.078 m AV Vmax:           96.65 cm/s    PV Peak grad:   0.9 mmHg AV Vmean:          62.900 cm/s  PV Mean grad:   1.0 mmHg AV VTI:            0.160 m      RVOT Peak grad: 4 mmHg AV Peak Grad:      3.7 mmHg AV Mean Grad:      2.0 mmHg LVOT Vmax:         43.10 cm/s LVOT Vmean:        30.000 cm/s LVOT VTI:          0.092 m LVOT/AV VTI ratio: 0.58  AORTA Ao Root diam: 3.00 cm MITRAL VALVE                TRICUSPID VALVE MV Area (PHT): 6.27 cm     TR Peak grad:   37.0 mmHg MV Area VTI:   1.53 cm     TR Vmax:        304.00 cm/s MV Peak grad:  8.8 mmHg MV Mean grad:  4.0 mmHg  SHUNTS MV Vmax:       1.48 m/s     Systemic VTI:  0.09 m MV Vmean:      94.4 cm/s    Systemic Diam: 2.00 cm MV Decel Time: 121 msec     Pulmonic VTI:  0.180 m MV E velocity: 114.00 cm/s [employer] Electronically signed by Neoma Laming Signature Date/Time: 09/15/2021/1:16:32 PM    Final      Medications:    dextrose 5 % and 0.9% NaCl 75 mL/hr at 09/15/21 2217   pantoprazole 8 mg/hr (09/16/21 1122)    acetaminophen  650 mg Oral Once   amiodarone  100 mg Oral Daily   atorvastatin  80 mg Oral Daily   Chlorhexidine Gluconate Cloth  6 each Topical Q0600   docusate sodium  100 mg Oral BID   furosemide  40 mg Intravenous BID   insulin aspart  0-9 Units Subcutaneous Q4H   levothyroxine  88 mcg Oral Q0600   nicotine  14 mg Transdermal Daily   patiromer  16.8 g Oral Daily   pneumococcal 23 valent vaccine  0.5 mL Intramuscular Tomorrow-1000   sodium chloride flush  3 mL Intravenous Q12H   acetaminophen **OR** acetaminophen, bisacodyl, hydrALAZINE, morphine injection, nitroGLYCERIN, ondansetron **OR** ondansetron (ZOFRAN) IV, oxyCODONE, polyethylene glycol, traZODone  Assessment/ Plan:  Mr. Macalister Arnaud is a 75 y.o. 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 Acute kidney injury secondary to acute blood loss anemia - holding Entresto - monitor volume status closely on IV fluids: continue D5 with NS infusion at 37mL/hr - patiromer  Hypertension with chronic kidney disease: holding entresto as above, patient holding spironolactone due to hyperkalemia.  - discontinue furosemide  Diabetes mellitus type II with chronic kidney disease: with hypoglycemia.  - do not recommend restarting sulfonylurea.  - dextrose infusion  Anemia with chronic kidney disease and acute blood loss anemia. Status post PRBC transfusion on admission. With acute GI bleed. Appreciate GI input.    LOS: 2 C[place]um Federici 12/28/202212:24 PM

## 2021-09-16 NOTE — TOC Initial Note (Signed)
Transition of Care [facility]) - Initial/Assessment Note    Patient Details  Name: Walter Blair MRN: [id number] Date of Birth: July 09, 1946  Transition of [facility]) CM/SW Contact:    Shelbie Hutching, RN Phone Number: 09/16/2021, 11:30 AM  Clinical Narrative:                 Patient admitted to the hospital for hyperkalemia, AKI, and GI bleeding.  RNCM met with patient at the bedside.  TOC consult for heart failure HH screen.  Patient is from home with his brother, independent at baseline and reports he drives but has not for the past 2 weeks.  Patient is current with PCP Dr. Elijio Miles and his cardiologist is Dr. Humphrey Rolls, both over at [facility].   Patient is open with [facility] for RN will likely add PT to the home health orders at discharge.  Patient reports that his brother or nephew will be able to pick him up at discharge.    Expected Discharge Plan: [place] Barriers to Discharge: Continued Medical Work up   Patient Goals and CMS Choice Patient states their goals for this hospitalization and ongoing recovery are:: Patient wants to go home as soon as possible. CMS Medicare.gov Compare Post Acute Care list provided to:: Patient Choice offered to / list presented to : Patient  Expected Discharge Plan and Services Expected Discharge Plan: [place]   Discharge Planning Services: CM Consult Post Acute Care Choice: [place] arrangements for the past 2 months: Single Family Home                 DME Arranged: N/A DME Agency: NA       HH Arranged: RN, PT [place] Agency: [place] ([place]) Date HH Agency Contacted: 09/16/21 Time [place]: 1129 Representative spoke with at [place]: Corene Cornea  Prior Living Arrangements/Services Living arrangements for the past 2 months: [place] Lives with:: Siblings Patient language and need for interpreter reviewed:: Yes Do you feel safe going back to the place where you  live?: Yes      Need for Family Participation in Patient Care: Yes (Comment) Care giver support system in place?: Yes (comment) (brother and nephew)   Criminal Activity/Legal Involvement Pertinent to Current Situation/Hospitalization: No - Comment as needed  Activities of Daily Living Home Assistive Devices/Equipment: CBG Meter, Blood pressure cuff, Eyeglasses, Other (Comment) (pulse oz) ADL Screening (condition at time of admission) Patient's cognitive ability adequate to safely complete daily activities?: Yes Is the patient deaf or have difficulty hearing?: No Does the patient have difficulty seeing, even when wearing glasses/contacts?: No Does the patient have difficulty concentrating, remembering, or making decisions?: No Patient able to express need for assistance with ADLs?: Yes Does the patient have difficulty dressing or bathing?: No Independently performs ADLs?: Yes (appropriate for developmental age) Does the patient have difficulty walking or climbing stairs?: No Weakness of Legs: Both Weakness of Arms/Hands: Both  Permission Sought/Granted Permission sought to share information with : Case Manager, Family Supports, Other (comment) Permission granted to share information with : Yes, Verbal Permission Granted  Share Information with NAME: Hilliard Clark  Permission granted to share info w AGENCY: [place] granted to share info w Relationship: brother  Permission granted to share info w Contact Information: [phone]  Emotional Assessment Appearance:: Appears stated age Attitude/Demeanor/Rapport: Engaged Affect (typically observed): Accepting Orientation: : Oriented to Self, Oriented to Place, Oriented to  Time,  Oriented to Situation Alcohol / Substance Use: Not Applicable Psych Involvement: No (comment)  Admission diagnosis:  Hyperkalemia [E87.5] Hypoglycemia [E16.2] Generalized weakness [R53.1] Acute kidney injury ([place])  [N17.9] Gastrointestinal hemorrhage, unspecified gastrointestinal hemorrhage type [K92.2] Patient Active Problem List   Diagnosis Date Noted   Melena 09/15/2021   Chest pain 09/15/2021   Hypoglycemia 09/15/2021   Generalized weakness 09/14/2021   Pancytopenia ([place]) 07/30/2021   Acute on chronic systolic CHF (congestive heart failure) ([place]) 07/24/2021   Atrial fibrillation, chronic ([place]) 07/24/2021   H/O: stroke    Hematuria    Acute renal failure superimposed on stage 3a chronic kidney disease (HCC)    Thrombocytopenia (HCC)    HLD (hyperlipidemia)    Type II diabetes mellitus with renal manifestations ([place])    Hypothyroid    IDA (iron deficiency anemia) 01/27/2021   Mixed hyperlipidemia 02/06/2020   Grief 07/04/2017   Dizziness    SOB (shortness of breath)    Encounter for central line placement    New onset atrial fibrillation ([place]) 06/27/2017   Diabetes ([place]) 06/27/2017   HTN (hypertension) 06/27/2017   CAD (coronary artery disease) 06/27/2017   PCP:  Jodi Marble, MD Pharmacy:   [id number] - [place], [place] AT [place] [address] [place] [address] Phone: [phone] Fax: [phone]     Social Determinants of Health (SDOH) Interventions    Readmission Risk Interventions No flowsheet data found.

## 2021-09-16 NOTE — Progress Notes (Signed)
Patient is having low BP as indicated in the flow sheet. Notified Dr. Sidney Ace and received orders for 250 ml bolus to run for 2 hours. Will administer and continue to monitor.

## 2021-09-17 ENCOUNTER — Inpatient Hospital Stay: Payer: Medicare Other | Admitting: Anesthesiology

## 2021-09-17 ENCOUNTER — Encounter: Payer: Self-pay | Admitting: Gastroenterology

## 2021-09-17 ENCOUNTER — Encounter
Admission: EM | Disposition: A | Discharge status: Home Health | Payer: Self-pay | Source: Home / Self Care | Attending: Internal Medicine

## 2021-09-17 DIAGNOSIS — D649 Anemia, unspecified: Secondary | ICD-10-CM | POA: Diagnosis not present

## 2021-09-17 DIAGNOSIS — R531 Weakness: Secondary | ICD-10-CM | POA: Diagnosis not present

## 2021-09-17 DIAGNOSIS — K921 Melena: Secondary | ICD-10-CM | POA: Diagnosis not present

## 2021-09-17 HISTORY — PX: ESOPHAGOGASTRODUODENOSCOPY: SHX5428

## 2021-09-17 LAB — CBC
HCT: 24.3 % — ABNORMAL LOW (ref 39.0–52.0)
Hemoglobin: 7.4 g/dL — ABNORMAL LOW (ref 13.0–17.0)
MCH: 27.8 pg (ref 26.0–34.0)
MCHC: 30.5 g/dL (ref 30.0–36.0)
MCV: 91.4 fL (ref 80.0–100.0)
Platelets: 115 10*3/uL — ABNORMAL LOW (ref 150–400)
RBC: 2.66 MIL/uL — ABNORMAL LOW (ref 4.22–5.81)
RDW: 18.9 % — ABNORMAL HIGH (ref 11.5–15.5)
WBC: 4.1 10*3/uL (ref 4.0–10.5)
nRBC: 0 % (ref 0.0–0.2)

## 2021-09-17 LAB — GLUCOSE, CAPILLARY
Glucose-Capillary: 116 mg/dL — ABNORMAL HIGH (ref 70–99)
Glucose-Capillary: 121 mg/dL — ABNORMAL HIGH (ref 70–99)
Glucose-Capillary: 147 mg/dL — ABNORMAL HIGH (ref 70–99)
Glucose-Capillary: 155 mg/dL — ABNORMAL HIGH (ref 70–99)
Glucose-Capillary: 177 mg/dL — ABNORMAL HIGH (ref 70–99)
Glucose-Capillary: 251 mg/dL — ABNORMAL HIGH (ref 70–99)
Glucose-Capillary: 59 mg/dL — ABNORMAL LOW (ref 70–99)
Glucose-Capillary: 68 mg/dL — ABNORMAL LOW (ref 70–99)
Glucose-Capillary: 70 mg/dL (ref 70–99)
Glucose-Capillary: 75 mg/dL (ref 70–99)

## 2021-09-17 LAB — HAPTOGLOBIN: Haptoglobin: 160 mg/dL (ref 34–355)

## 2021-09-17 LAB — BASIC METABOLIC PANEL
Anion gap: 5 (ref 5–15)
BUN: 26 mg/dL — ABNORMAL HIGH (ref 8–23)
CO2: 15 mmol/L — ABNORMAL LOW (ref 22–32)
Calcium: 7.9 mg/dL — ABNORMAL LOW (ref 8.9–10.3)
Chloride: 111 mmol/L (ref 98–111)
Creatinine, Ser: 1.66 mg/dL — ABNORMAL HIGH (ref 0.61–1.24)
GFR, Estimated: 42 mL/min — ABNORMAL LOW (ref >60)
Glucose, Bld: 78 mg/dL (ref 70–99)
Potassium: 3.6 mmol/L (ref 3.5–5.1)
Sodium: 131 mmol/L — ABNORMAL LOW (ref 135–145)

## 2021-09-17 LAB — HEMOGLOBIN AND HEMATOCRIT, BLOOD
HCT: 24.2 % — ABNORMAL LOW (ref 39.0–52.0)
Hemoglobin: 7.6 g/dL — ABNORMAL LOW (ref 13.0–17.0)

## 2021-09-17 SURGERY — EGD (ESOPHAGOGASTRODUODENOSCOPY)
Anesthesia: General

## 2021-09-17 MED ORDER — SODIUM CHLORIDE 0.9 % IV SOLN: INTRAVENOUS | Status: DC

## 2021-09-17 MED ORDER — DEXTROSE 50 % IV SOLN
INTRAVENOUS | Status: AC
  Filled 2021-09-17: qty 50

## 2021-09-17 MED ORDER — DEXTROSE 50 % IV SOLN
1.0000 | Freq: Once | INTRAVENOUS | Status: AC
  Administered 2021-09-17: 11:00:00 50 mL via INTRAVENOUS

## 2021-09-17 MED ORDER — PROPOFOL 10 MG/ML IV BOLUS
INTRAVENOUS | Status: DC | PRN
  Administered 2021-09-17: 20 mg via INTRAVENOUS
  Administered 2021-09-17: 40 mg via INTRAVENOUS

## 2021-09-17 NOTE — Plan of Care (Signed)

## 2021-09-17 NOTE — Anesthesia Procedure Notes (Signed)
Date/Time: 09/17/2021 11:05 AM Performed by: Johnna Acosta, CRNA Pre-anesthesia Checklist: Patient identified, Emergency Drugs available, Suction available, Patient being monitored and Timeout performed Patient Re-evaluated:Patient Re-evaluated prior to induction Oxygen Delivery Method: Nasal cannula Preoxygenation: Pre-oxygenation with 100% oxygen Induction Type: IV induction

## 2021-09-17 NOTE — Anesthesia Postprocedure Evaluation (Signed)
Anesthesia Post Note  Patient: Walter Blair  Procedure(s) Performed: ESOPHAGOGASTRODUODENOSCOPY (EGD)  Patient location during evaluation: PACU Anesthesia Type: General Level of consciousness: awake and alert, oriented and patient cooperative Pain management: pain level controlled Vital Signs Assessment: post-procedure vital signs reviewed and stable Respiratory status: spontaneous breathing, nonlabored ventilation and respiratory function stable Cardiovascular status: blood pressure returned to baseline and stable Postop Assessment: adequate PO intake Anesthetic complications: no   No notable events documented.   Last Vitals:  Vitals:   09/17/21 1210 09/17/21 1237  BP: (!) 99/52 110/62  Pulse: 62 (!) 59  Resp: 16 16  Temp:  36.8 C  SpO2:  100%    Last Pain:  Vitals:   09/17/21 1237  TempSrc:   PainSc: 0-No pain                 Darrin Nipper

## 2021-09-17 NOTE — Care Management Important Message (Signed)
Important Message  Patient Details  Name: Walter Blair MRN: [id number] Date of Birth: 06-Feb-1946   Medicare Important Message Given:  Yes     Juliann Pulse A Anquan Azzarello 09/17/2021, 12:51 PM

## 2021-09-17 NOTE — Op Note (Signed)
[facility] Gastroenterology Patient Name: Walter Blair Procedure Date: 09/17/2021 11:04 AM MRN: [id number] Account #: [id number] Date of Birth: 1945/12/31 Admit Type: Inpatient Age: 75 Room: [facility] ENDO ROOM 2 Gender: Male Note Status: Finalized Instrument Name: Upper Endoscope [id number] Procedure:             Upper GI endoscopy Indications:           Acute post hemorrhagic anemia, Melena Providers:             Harleyquinn Gasser B. Bonna Gains MD, MD Referring MD:          Venetia Maxon. Elijio Miles, MD (Referring MD) Medicines:             Monitored Anesthesia Care Complications:         No immediate complications. Procedure:             Pre-Anesthesia Assessment:                        - Prior to the procedure, a History and Physical was                         performed, and patient medications, allergies and                         sensitivities were reviewed. The patient's tolerance                         of previous anesthesia was reviewed.                        - The risks and benefits of the procedure and the                         sedation options and risks were discussed with the                         patient. All questions were answered and informed                         consent was obtained.                        - Patient identification and proposed procedure were                         verified prior to the procedure by the physician, the                         nurse, the anesthesiologist, the anesthetist and the                         technician. The procedure was verified in the                         procedure room.                        - ASA Grade Assessment: III - A patient with severe  systemic disease.                        After obtaining informed consent, the endoscope was                         passed under direct vision. Throughout the procedure,                         the patient's blood pressure, pulse, and oxygen                          saturations were monitored continuously. The Endoscope                         was introduced through the mouth, and advanced to the                         third part of duodenum. The upper GI endoscopy was                         accomplished with ease. The patient tolerated the                         procedure well. Findings:      The examined esophagus was normal.      The entire examined stomach was normal.      The duodenal bulb, second portion of the duodenum and examined duodenum       were normal.      No evidence of active bleeding on this exam, no etiology of melena       present on this exam Impression:            - Normal esophagus.                        - Normal stomach.                        - Normal duodenal bulb, second portion of the duodenum                         and examined duodenum.                        - No specimens collected. Recommendation:        - Return patient to hospital ward for ongoing care.                        - Advance diet as tolerated.                        - D/c PPI                        - Continue present medications.                        - Return to GI clinic in 3-4 weeks with Dr. Vicente Males.                        -  Given that patient's melena has resolved, Xarelto                         has been stopped by Cardiology, and he has had                         colonoscopy and capsule study this year, the benefits                         of a repeat colonoscopy at this time are low (low risk                         of a colon malignancy given recent colonoscopy). Would                         recommend close follow up in GI clinic with Dr. Vicente Males                         to monitor symptoms, Hemoglobin, and discuss further                         procedures or workup as necessary.                        - If further melena occurs or Hemoglobin does not                         improve, repeat colonoscopy can be considered  as an                         inpatient. Please page GI on call with any symptoms of                         active GI bleeding                        - The findings and recommendations were discussed with                         the patient.                        - The findings and recommendations were discussed with                         the patient's family. Procedure Code(s):     --- Professional ---                        [phone], Esophagogastroduodenoscopy, flexible,                         transoral; diagnostic, including collection of                         specimen(s) by brushing or washing, when performed                         (separate procedure) Diagnosis Code(s):     ---  Professional ---                        D62, Acute posthemorrhagic anemia                        K92.1, Melena (includes Hematochezia) CPT copyright 2019 American Medical Association. All rights reserved. The codes documented in this report are preliminary and upon coder review may  be revised to meet current compliance requirements.  Vonda Antigua, MD Margretta Sidle B. Bonna Gains MD, MD 09/17/2021 11:43:25 AM This report has been signed electronically. Number of Addenda: 0 Note Initiated On: 09/17/2021 11:04 AM Estimated Blood Loss:  Estimated blood loss: none.      [facility]

## 2021-09-17 NOTE — Progress Notes (Addendum)
[facility]  ROUNDING NOTE   Subjective:   Patient seen resting quietly Alert with some confusion Currently n.p.o. for procedure  Creatinine 1.66 (1.79) Recorded urine output of 2.7 L on day shift yesterday.   Objective:  Vital signs in last 24 hours:  Temp:  [96.9 F (36.1 C)-100 F (37.8 C)] 98.3 F (36.8 C) (12/29 1237) Pulse Rate:  [57-67] 59 (12/29 1237) Resp:  [13-24] 16 (12/29 1237) BP: (90-139)/(49-109) 110/62 (12/29 1237) SpO2:  [91 %-100 %] 100 % (12/29 1237)  Weight change:  Filed Weights   09/14/21 2032 09/15/21 2205 09/16/21 0500  Weight: 56.2 kg 60 kg 60.3 kg    Intake/Output: I/O last 3 completed shifts: In: 4585.9 [P.O.:200; I.V.:3883.2; IV Piggyback:502.7] Out: 4250 [Urine:4250]   Intake/Output this shift:  Total I/O In: 100 [I.V.:100] Out: 0   Physical Exam: General: NAD, laying in bed  Head: Normocephalic, atraumatic. Moist oral mucosal membranes  Eyes: Anicteric  Neck: Supple, trachea midline  Lungs:  Clear to auscultation  Heart: Regular rate and rhythm  Abdomen:  Soft, nontender  Extremities:  No peripheral edema.  Neurologic: Nonfocal, moving all four extremities  Skin: No lesions       Basic Metabolic Panel: Recent Labs  Lab 09/14/21 2038 09/15/21 0702 09/16/21 1133 09/17/21 0422  NA 132* 131* 133* 131*  K 5.7* 5.3* 4.5 3.6  CL 105 109 108 111  CO2 18* 16* 17* 15*  GLUCOSE 52* 56* 139* 78  BUN 56* 50* 32* 26*  CREATININE 2.41* 2.23* 1.79* 1.66*  CALCIUM 9.3 8.1* 8.7* 7.9*     Liver Function Tests: Recent Labs  Lab 09/14/21 2038  AST 34  ALT 22  ALKPHOS 68  BILITOT 0.9  PROT 7.0  ALBUMIN 3.5    No results for input(s): LIPASE, AMYLASE in the last 168 hours. No results for input(s): AMMONIA in the last 168 hours.  CBC: Recent Labs  Lab 09/14/21 2038 09/15/21 0702 09/15/21 2047 09/16/21 0830 09/17/21 0422  WBC 7.4 4.7  --  4.9 4.1  NEUTROABS 6.7  --   --   --   --   HGB 6.5* 7.0* 8.2*  8.1* 7.4*  HCT 20.5* 21.7* 25.6* 25.1* 24.3*  MCV 95.8 90.4  --  90.3 91.4  PLT 214 132*  --  138* 115*     Cardiac Enzymes: No results for input(s): CKTOTAL, CKMB, CKMBINDEX, TROPONINI in the last 168 hours.  BNP: Invalid input(s): POCBNP  CBG: Recent Labs  Lab 09/17/21 0418 09/17/21 0924 09/17/21 1040 09/17/21 1146 09/17/21 1234  GLUCAP 75 70 68* 147* 121*     Microbiology: Results for orders placed or performed during the hospital encounter of 09/14/21  Resp Panel by RT-PCR (Flu A&B, Covid) Nasopharyngeal Swab     Status: None   Collection Time: 09/14/21  8:38 PM   Specimen: Nasopharyngeal Swab; Nasopharyngeal(NP) swabs in vial transport medium  Result Value Ref Range Status   SARS Coronavirus 2 by RT PCR NEGATIVE NEGATIVE Final    Comment: (NOTE) SARS-CoV-2 target nucleic acids are NOT DETECTED.  The SARS-CoV-2 RNA is generally detectable in upper respiratory specimens during the acute phase of infection. The lowest concentration of SARS-CoV-2 viral copies this assay can detect is 138 copies/mL. A negative result does not preclude SARS-Cov-2 infection and should not be used as the sole basis for treatment or other patient management decisions. A negative result may occur with  improper specimen collection/handling, submission of specimen other than nasopharyngeal swab, presence  of viral mutation(s) within the areas targeted by this assay, and inadequate number of viral copies(<138 copies/mL). A negative result must be combined with clinical observations, patient history, and epidemiological information. The expected result is Negative.  Fact Sheet for Patients:  EntrepreneurPulse.com.au  Fact Sheet for Healthcare Providers:  IncredibleEmployment.be  This test is no t yet approved or cleared by the [place] FDA and  has been authorized for detection and/or diagnosis of SARS-CoV-2 by FDA under an Emergency Use  Authorization (EUA). This EUA will remain  in effect (meaning this test can be used) for the duration of the COVID-19 declaration under Section 564(b)(1) of the Act, 21 U.S.C.section 360bbb-3(b)(1), unless the authorization is terminated  or revoked sooner.       Influenza A by PCR NEGATIVE NEGATIVE Final   Influenza B by PCR NEGATIVE NEGATIVE Final    Comment: (NOTE) The Xpert Xpress SARS-CoV-2/FLU/RSV plus assay is intended as an aid in the diagnosis of influenza from Nasopharyngeal swab specimens and should not be used as a sole basis for treatment. Nasal washings and aspirates are unacceptable for Xpert Xpress SARS-CoV-2/FLU/RSV testing.  Fact Sheet for Patients: EntrepreneurPulse.com.au  Fact Sheet for Healthcare Providers: IncredibleEmployment.be  This test is not yet approved or cleared by the [place] FDA and has been authorized for detection and/or diagnosis of SARS-CoV-2 by FDA under an Emergency Use Authorization (EUA). This EUA will remain in effect (meaning this test can be used) for the duration of the COVID-19 declaration under Section 564(b)(1) of the Act, 21 U.S.C. section 360bbb-3(b)(1), unless the authorization is terminated or revoked.  Performed at [facility], [address] [place], [place] [address]   MRSA Next Gen by PCR, Nasal     Status: None   Collection Time: 09/15/21 10:15 PM   Specimen: Nasal Mucosa; Nasal Swab  Result Value Ref Range Status   MRSA by PCR Next Gen NOT DETECTED NOT DETECTED Final    Comment: (NOTE) The GeneXpert MRSA Assay (FDA approved for NASAL specimens only), is one component of a comprehensive MRSA colonization surveillance program. It is not intended to diagnose MRSA infection nor to guide or monitor treatment for MRSA infections. Test performance is not FDA approved in patients less than 30 years old. Performed at [facility], [address]  [place], [place] [address]     Coagulation Studies: Recent Labs    09/15/21 1828  LABPROT 23.9*  INR 2.1*     Urinalysis: No results for input(s): COLORURINE, LABSPEC, PHURINE, GLUCOSEU, HGBUR, BILIRUBINUR, KETONESUR, PROTEINUR, UROBILINOGEN, NITRITE, LEUKOCYTESUR in the last 72 hours.  Invalid input(s): APPERANCEUR     Imaging: No results found.   Medications:    dextrose 5 % and 0.9% NaCl 75 mL/hr at 09/15/21 2217   ferric gluconate (FERRLECIT) IVPB 250 mg (09/16/21 1405)   pantoprazole 8 mg/hr (09/17/21 1011)    amiodarone  100 mg Oral Daily   atorvastatin  80 mg Oral Daily   Chlorhexidine Gluconate Cloth  6 each Topical Q0600   docusate sodium  100 mg Oral BID   insulin aspart  0-9 Units Subcutaneous Q4H   levothyroxine  88 mcg Oral Q0600   nicotine  14 mg Transdermal Daily   pneumococcal 23 valent vaccine  0.5 mL Intramuscular Tomorrow-1000   sodium chloride flush  3 mL Intravenous Q12H   acetaminophen **OR** acetaminophen, bisacodyl, hydrALAZINE, morphine injection, nitroGLYCERIN, ondansetron **OR** ondansetron (ZOFRAN) IV, oxyCODONE, polyethylene glycol, traZODone  Assessment/ Plan:  [name] is  a 75 y.o. 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 Acute kidney injury secondary to acute blood loss anemia - holding Entresto -Renal function slowly returning to baseline - monitor volume status  closely on IV fluids: continue D5NS at 86mL/hr -We will stop [place] due to stable potassium levels  Hypertension with chronic kidney disease: holding entresto as above, patient holding spironolactone due to hyperkalemia.  -Furosemide stopped.  BP 110/62 at this time  Diabetes mellitus type II with chronic kidney disease: with hypoglycemia.  - do not recommend restarting sulfonylurea.  - dextrose infusion -Glucose stable  Anemia with chronic kidney disease and acute blood loss anemia. Status post PRBC transfusion on admission. With acute GI bleed. Appreciate GI input.  EGD completed with no active bleeding noted.  Hyponatremia likely due to kidney injury.  Sodium 13. we will continue to monitor with kidney recovery   LOS: [place] 12/29/202212:46 PM

## 2021-09-17 NOTE — Progress Notes (Signed)
PROGRESS NOTE    Walter Blair  BJY:[id number] DOB: 1945-11-29 DOA: 09/14/2021 PCP: Jodi Marble, MD    Brief Narrative:  Walter Blair is a 75 year old male with past medical history significant for chronic combined systolic/diastolic congestive heart failure, paroxysmal atrial fibrillation, CKD stage IIIa, GERD, essential hypertension, hypothyroidism, iron deficiency anemia, type 2 diabetes mellitus, history of CVA who presented to [facility] ED on 12/26 with progressive weakness, fatigue, dizziness, intermittent chest pain with nausea over the past 3 days.  Also reports dark stools for last few days.  In the ED, temperature 97.3 degrees Fahrenheit, HR 60, RR 19, BP 94/51, SPO2 97% on 2 L nasal cannula.  Sodium 132, potassium 5.7, chloride 105, CO2 18, glucose 52, BUN 56, creatinine 2.41, BNP 2570.8, high sensitive troponin 19.  WBC 7.4, hemoglobin 6.5, platelets 214.  COVID-19 PCR negative.  Influenza A/B PCR negative.  Urinalysis unrevealing.  Chest x-ray with chronic bronchitic changes lungs without consolidation/edema.  FOBT positive.  Patient was transfused 1 unit PRBC, 5 heart ML bolus of NS and 16.8 g of Veltassa given for hyperkalemia.  Hospital service consulted for further evaluation and management of acute on chronic anemia secondary to GI bleed in the setting of Xarelto use, hyperkalemia, hypotension.   Assessment & Plan:   Principal Problem:   Generalized weakness Active Problems:   Diabetes (HCC)   HTN (hypertension)   CAD (coronary artery disease)   SOB (shortness of breath)   IDA (iron deficiency anemia)   Acute on chronic systolic CHF (congestive heart failure) (HCC)   Atrial fibrillation, chronic (HCC)   Acute renal failure superimposed on stage 3a chronic kidney disease (HCC)   HLD (hyperlipidemia)   Hypothyroid   Melena   Chest pain   Hypoglycemia   Anemia concerning for upper GI bleed Hx iron deficiency anemia Patient presenting to the ED with progressive  weakness, fatigue, dizziness and started stools over the last 3 days.  Complicated by use of Xarelto outpatient for atrial fibrillation.  FOBT positive with hemoglobin 6.5 on admission.  EGD 12/29 with no significant findings and recommend discontinue PPI. --Gastroenterology following, appreciate assistance --s/p 2u pRBC 12/27 --Hemoglobin 6.5>>8.2>8.1>7.4 --Ferrlecit 200 mg IV daily x2 doses -- Repeat hemoglobin this afternoon  Acute renal failure on CKD stage IIIa Baseline creatinine 1.59 with a GFR 45 on 08/06/2021.  Etiology likely secondary to diabetic nephropathy and hypertensive nephrosclerosis.  Acute decompensation likely secondary to acute blood loss anemia. --Cr 2.41>2.23>1.79>1.66 --Holding Entresto --S/p 2u pRBC transfusion 12/27 --Continue IV fluid hydration with D5 NS at 75 MLS per hour --BMP in a.m.  Hyperkalemia: Resolved Potassium 5.7 on admission.  Likely secondary to acute renal failure in the setting of GI bleed.  Received NS bolus followed by Veltassa. --Nephrology following, patient assistance --K 5.7>>4.5>3.6 --Holding Entresto and spironolactone --BMP daily  Hypoglycemia Hx Type 2 diabetes mellitus Hemoglobin A1c 5.4, well controlled.  On glyburide 2.5 mg p.o. daily and glipizide XL 2.5 mg p.o. daily at home. --Diabetic educator following, appreciate assistance --Will discontinue glyburide/glipizide on discharge --Consider Tradjenta 5 mg p.o. daily on discharge  Chronic combined systolic/diastolic congestive heart failure, compensated Home medication regimen includes torsemide 20-40 mg p.o. daily as needed, spironolactone 12.5 mg p.o. daily, Entresto 24-26 mg p.o. twice daily.  Chest x-ray with no acute cardiopulmonary disease process on admission. --Holding Entresto, spironolactone, diuretics due to hypotension, hyperkalemia and acute renal failure --Strict I's and O's Daily weights  Paroxysmal atrial fibrillation --Cardiology following --Xarelto  discontinued --Amiodarone 100  mg p.o. daily --Monitor on telemetry  GERD: Continue PPI drip as above  Essential hypertension Patient was notably hypotensive on admission, likely secondary to acute blood loss anemia as above. --BP 112/64 this morning --Holding home Entresto, spironolactone, torsemide above  Hyperlipidemia: Atorvastatin 80 mg p.o. daily  Hypothyroidism --Levothyroxine 88 mcg p.o. daily  Tobacco use disorder: Counseled on need for complete cessation. --Nicotine patch  Hx CVA --Continue statin   DVT prophylaxis: SCDs Start: 09/14/21 2330   Code Status: Full Code Family Communication: Son updated at bedside this morning  Disposition Plan:  Level of care: Telemetry Medical Status is: Inpatient  Remains inpatient appropriate because: Needs further surveillance of hemoglobin, is drifting down and may need further blood transfusion    Consultants:  Cardiology, [facility], Dr. Enriqueta Shutter Nephrology, Dr. Juleen China Medical oncology/hematology, Dr. Grayland Ormond  Procedures:  EGD 12/29, Dr. Enriqueta Shutter  Antimicrobials:  None   Subjective: Patient seen examined bedside, resting comfortably.  Going down for EGD this morning.  No complaints.  Specifically denies any further dizziness.  Son present at bedside and updated.  Hemoglobin slightly trended down from 8.1-7.4 this morning.  No other complaints or concerns at this time. Denies headache, no visual changes, no chest pain, palpitations, no shortness of breath, no abdominal pain, no fever/chills/night sweats, no nausea/vomiting/diarrhea, no paresthesias.  No acute events overnight per nursing staff.  Hemodynamically stable for transfer to telemetry.  Objective: Vitals:   09/17/21 1000 09/17/21 1129 09/17/21 1133 09/17/21 1139  BP:  (!) 109/53 (!) 109/53 (!) 98/49  Pulse: 61 61 (!) 59 (!) 59  Resp:  (!) 21 13 18   Temp:  (!) 96.9 F (36.1 C) (!) 96.9 F (36.1 C)   TempSrc:  Tympanic Temporal   SpO2:   96% 100% 100%  Weight:      Height:        Intake/Output Summary (Last 24 hours) at 09/17/2021 1147 Last data filed at 09/17/2021 1133 Gross per 24 hour  Intake 929.9 ml  Output 1600 ml  Net -670.1 ml   Filed Weights   09/14/21 2032 09/15/21 2205 09/16/21 0500  Weight: 56.2 kg 60 kg 60.3 kg    Examination:  General exam: Appears calm and comfortable, thin in appearance Respiratory system: Clear to auscultation. Respiratory effort normal.  On room air. Cardiovascular system: S1 & S2 heard, RRR. No JVD, murmurs, rubs, gallops or clicks. No pedal edema. Gastrointestinal system: Abdomen is nondistended, soft and nontender. No organomegaly or masses felt. Normal bowel sounds heard. Central nervous system: Alert and oriented. No focal neurological deficits. Extremities: Symmetric 5 x 5 power. Skin: No rashes, lesions or ulcers Psychiatry: Judgement and insight appear normal. Mood & affect appropriate.     Data Reviewed: I have personally reviewed following labs and imaging studies  CBC: Recent Labs  Lab 09/14/21 2038 09/15/21 0702 09/15/21 2047 09/16/21 0830 09/17/21 0422  WBC 7.4 4.7  --  4.9 4.1  NEUTROABS 6.7  --   --   --   --   HGB 6.5* 7.0* 8.2* 8.1* 7.4*  HCT 20.5* 21.7* 25.6* 25.1* 24.3*  MCV 95.8 90.4  --  90.3 91.4  PLT 214 132*  --  138* 557*   Basic Metabolic Panel: Recent Labs  Lab 09/14/21 2038 09/15/21 0702 09/16/21 1133 09/17/21 0422  NA 132* 131* 133* 131*  K 5.7* 5.3* 4.5 3.6  CL 105 109 108 111  CO2 18* 16* 17* 15*  GLUCOSE 52* 56* 139* 78  BUN 56* 50*  32* 26*  CREATININE 2.41* 2.23* 1.79* 1.66*  CALCIUM 9.3 8.1* 8.7* 7.9*   GFR: Estimated Creatinine Clearance: 31.7 mL/min (A) (by C-G formula based on SCr of 1.66 mg/dL (H)). Liver Function Tests: Recent Labs  Lab 09/14/21 2038  AST 34  ALT 22  ALKPHOS 68  BILITOT 0.9  PROT 7.0  ALBUMIN 3.5   No results for input(s): LIPASE, AMYLASE in the last 168 hours. No results for  input(s): AMMONIA in the last 168 hours. Coagulation Profile: Recent Labs  Lab 09/15/21 1828  INR 2.1*   Cardiac Enzymes: No results for input(s): CKTOTAL, CKMB, CKMBINDEX, TROPONINI in the last 168 hours. BNP (last 3 results) No results for input(s): PROBNP in the last 8760 hours. HbA1C: Recent Labs    09/15/21 0702  HGBA1C 5.4   CBG: Recent Labs  Lab 09/16/21 1924 09/16/21 2344 09/17/21 0418 09/17/21 0924 09/17/21 1040  GLUCAP 177* 90 75 70 68*   Lipid Profile: No results for input(s): CHOL, HDL, LDLCALC, TRIG, CHOLHDL, LDLDIRECT in the last 72 hours. Thyroid Function Tests: No results for input(s): TSH, T4TOTAL, FREET4, T3FREE, THYROIDAB in the last 72 hours. Anemia Panel: Recent Labs    09/15/21 1828  VITAMINB12 214  TIBC 301  IRON 42*   Sepsis Labs: No results for input(s): PROCALCITON, LATICACIDVEN in the last 168 hours.  Recent Results (from the past 240 hour(s))  Resp Panel by RT-PCR (Flu A&B, Covid) Nasopharyngeal Swab     Status: None   Collection Time: 09/14/21  8:38 PM   Specimen: Nasopharyngeal Swab; Nasopharyngeal(NP) swabs in vial transport medium  Result Value Ref Range Status   SARS Coronavirus 2 by RT PCR NEGATIVE NEGATIVE Final    Comment: (NOTE) SARS-CoV-2 target nucleic acids are NOT DETECTED.  The SARS-CoV-2 RNA is generally detectable in upper respiratory specimens during the acute phase of infection. The lowest concentration of SARS-CoV-2 viral copies this assay can detect is 138 copies/mL. A negative result does not preclude SARS-Cov-2 infection and should not be used as the sole basis for treatment or other patient management decisions. A negative result may occur with  improper specimen collection/handling, submission of specimen other than nasopharyngeal swab, presence of viral mutation(s) within the areas targeted by this assay, and inadequate number of viral copies(<138 copies/mL). A negative result must be combined  with clinical observations, patient history, and epidemiological information. The expected result is Negative.  Fact Sheet for Patients:  EntrepreneurPulse.com.au  Fact Sheet for Healthcare Providers:  IncredibleEmployment.be  This test is no t yet approved or cleared by the [place] FDA and  has been authorized for detection and/or diagnosis of SARS-CoV-2 by FDA under an Emergency Use Authorization (EUA). This EUA will remain  in effect (meaning this test can be used) for the duration of the COVID-19 declaration under Section 564(b)(1) of the Act, 21 U.S.C.section 360bbb-3(b)(1), unless the authorization is terminated  or revoked sooner.       Influenza A by PCR NEGATIVE NEGATIVE Final   Influenza B by PCR NEGATIVE NEGATIVE Final    Comment: (NOTE) The Xpert Xpress SARS-CoV-2/FLU/RSV plus assay is intended as an aid in the diagnosis of influenza from Nasopharyngeal swab specimens and should not be used as a sole basis for treatment. Nasal washings and aspirates are unacceptable for Xpert Xpress SARS-CoV-2/FLU/RSV testing.  Fact Sheet for Patients: EntrepreneurPulse.com.au  Fact Sheet for Healthcare Providers: IncredibleEmployment.be  This test is not yet approved or cleared by the [place] and has been authorized for  detection and/or diagnosis of SARS-CoV-2 by FDA under an Emergency Use Authorization (EUA). This EUA will remain in effect (meaning this test can be used) for the duration of the COVID-19 declaration under Section 564(b)(1) of the Act, 21 U.S.C. section 360bbb-3(b)(1), unless the authorization is terminated or revoked.  Performed at [facility], [address] [place], [place] [address]   MRSA Next Gen by PCR, Nasal     Status: None   Collection Time: 09/15/21 10:15 PM   Specimen: Nasal Mucosa; Nasal Swab  Result Value Ref Range Status   MRSA by PCR Next Gen  NOT DETECTED NOT DETECTED Final    Comment: (NOTE) The GeneXpert MRSA Assay (FDA approved for NASAL specimens only), is one component of a comprehensive MRSA colonization surveillance program. It is not intended to diagnose MRSA infection nor to guide or monitor treatment for MRSA infections. Test performance is not FDA approved in patients less than 5 years old. Performed at [facility], [address], [place], [place] [address]          Radiology Studies: ECHOCARDIOGRAM COMPLETE  Result Date: 09/15/2021    ECHOCARDIOGRAM REPORT   Patient Name:   Walter Blair Date of Exam: 09/15/2021 Medical Rec #:  [id number]      Height:       64.0 in Accession #:    [id number]     Weight:       123.9 lb Date of Birth:  [birth year]       BSA:          1.596 m Patient Age:    57 years       BP:           100/62 mmHg Patient Gender: M              HR:           58 bpm. Exam Location:  [facility] Procedure: 2D Echo, Cardiac Doppler and Color Doppler Indications:     CHF-acute systolic 626.94 / W54.62  History:         Patient has prior history of Echocardiogram examinations, most                  recent 07/25/2021. Prior CABG, Arrythmias:Atrial Fibrillation;                  Risk Factors:Diabetes and Hypertension. AICD.  Sonographer:     Sherrie Sport Referring Phys:  [place] Diagnosing Phys: Burns  1. Left ventricular ejection fraction, by estimation, is <20%. The left ventricle has severely decreased function. The left ventricle demonstrates global hypokinesis. The left ventricular internal cavity size was severely dilated. Left ventricular diastolic parameters are consistent with Grade II diastolic dysfunction (pseudonormalization).  2. Right ventricular systolic function is normal. The right ventricular size is severely enlarged.  3. Left atrial size was severely dilated.  4. Right atrial size was severely dilated.  5. The mitral valve is normal in structure. Moderate to  severe mitral valve regurgitation. No evidence of mitral stenosis.  6. Tricuspid valve regurgitation is moderate.  7. The aortic valve is normal in structure. Aortic valve regurgitation is not visualized. No aortic stenosis is present.  8. The inferior vena cava is normal in size with greater than 50% respiratory variability, suggesting right atrial pressure of 3 mmHg. Conclusion(s)/Recommendation(s): Findings consistent with ischemic cardiomyopathy. FINDINGS  Left Ventricle: Left ventricular ejection fraction, by estimation, is <20%. The left ventricle has severely  decreased function. The left ventricle demonstrates global hypokinesis. The left ventricular internal cavity size was severely dilated. There is no left ventricular hypertrophy. Left ventricular diastolic parameters are consistent with Grade II diastolic dysfunction (pseudonormalization). Right Ventricle: The right ventricular size is severely enlarged. No increase in right ventricular wall thickness. Right ventricular systolic function is normal. Left Atrium: Left atrial size was severely dilated. Right Atrium: Right atrial size was severely dilated. Pericardium: There is no evidence of pericardial effusion. Mitral Valve: The mitral valve is normal in structure. Moderate to severe mitral valve regurgitation. No evidence of mitral valve stenosis. MV peak gradient, 8.8 mmHg. The mean mitral valve gradient is 4.0 mmHg. Tricuspid Valve: The tricuspid valve is normal in structure. Tricuspid valve regurgitation is moderate . No evidence of tricuspid stenosis. Aortic Valve: The aortic valve is normal in structure. Aortic valve regurgitation is not visualized. No aortic stenosis is present. Aortic valve mean gradient measures 2.0 mmHg. Aortic valve peak gradient measures 3.7 mmHg. Aortic valve area, by VTI measures 1.81 cm. Pulmonic Valve: The pulmonic valve was normal in structure. Pulmonic valve regurgitation is mild. No evidence of pulmonic stenosis. Aorta:  The aortic root is normal in size and structure. Venous: The inferior vena cava is normal in size with greater than 50% respiratory variability, suggesting right atrial pressure of 3 mmHg. IAS/Shunts: No atrial level shunt detected by color flow Doppler.  LEFT VENTRICLE PLAX 2D LVIDd:         5.40 cm LVIDs:         4.90 cm LV PW:         1.20 cm LV IVS:        0.60 cm LVOT diam:     2.00 cm LV SV:         29 LV SV Index:   18 LVOT Area:     3.14 cm  LV Volumes (MOD) LV vol d, MOD A2C: 153.0 ml LV vol d, MOD A4C: 168.0 ml LV vol s, MOD A2C: 113.0 ml LV vol s, MOD A4C: 128.0 ml LV SV MOD A2C:     40.0 ml LV SV MOD A4C:     168.0 ml LV SV MOD BP:      40.6 ml RIGHT VENTRICLE RV Basal diam:  5.60 cm RV S prime:     9.36 cm/s TAPSE (M-mode): 4.1 cm LEFT ATRIUM             Index        RIGHT ATRIUM           Index LA diam:        4.60 cm 2.88 cm/m   RA Area:     27.70 cm LA Vol (A2C):   86.1 ml 53.94 ml/m  RA Volume:   97.40 ml  61.02 ml/m LA Vol (A4C):   90.2 ml 56.51 ml/m LA Biplane Vol: 91.1 ml 57.08 ml/m  AORTIC VALVE                    PULMONIC VALVE AV Area (Vmax):    1.40 cm     PV Vmax:        0.48 m/s AV Area (Vmean):   1.50 cm     PV Vmean:       33.300 cm/s AV Area (VTI):     1.81 cm     PV VTI:         0.078 m AV Vmax:  96.65 cm/s   PV Peak grad:   0.9 mmHg AV Vmean:          62.900 cm/s  PV Mean grad:   1.0 mmHg AV VTI:            0.160 m      RVOT Peak grad: 4 mmHg AV Peak Grad:      3.7 mmHg AV Mean Grad:      2.0 mmHg LVOT Vmax:         43.10 cm/s LVOT Vmean:        30.000 cm/s LVOT VTI:          0.092 m LVOT/AV VTI ratio: 0.58  AORTA Ao Root diam: 3.00 cm MITRAL VALVE                TRICUSPID VALVE MV Area (PHT): 6.27 cm     TR Peak grad:   37.0 mmHg MV Area VTI:   1.53 cm     TR Vmax:        304.00 cm/s MV Peak grad:  8.8 mmHg MV Mean grad:  4.0 mmHg     SHUNTS MV Vmax:       1.48 m/s     Systemic VTI:  0.09 m MV Vmean:      94.4 cm/s    Systemic Diam: 2.00 cm MV Decel Time: 121 msec      Pulmonic VTI:  0.180 m MV E velocity: 114.00 cm/s [employer] Electronically signed by Neoma Laming Signature Date/Time: 09/15/2021/1:16:32 PM    Final         Scheduled Meds:  [KDT Hold] amiodarone  100 mg Oral Daily   [MAR Hold] atorvastatin  80 mg Oral Daily   [MAR Hold] Chlorhexidine Gluconate Cloth  6 each Topical Q0600   [MAR Hold] docusate sodium  100 mg Oral BID   [MAR Hold] insulin aspart  0-9 Units Subcutaneous Q4H   [MAR Hold] levothyroxine  88 mcg Oral Q0600   [MAR Hold] nicotine  14 mg Transdermal Daily   pneumococcal 23 valent vaccine  0.5 mL Intramuscular Tomorrow-1000   [MAR Hold] sodium chloride flush  3 mL Intravenous Q12H   Continuous Infusions:  sodium chloride 20 mL/hr at 09/17/21 1103   dextrose 5 % and 0.9% NaCl 75 mL/hr at 09/15/21 2217   ferric gluconate (FERRLECIT) IVPB 250 mg (09/16/21 1405)   pantoprazole 8 mg/hr (09/17/21 1011)     LOS: 3 days    Time spent: 36 minutes spent on chart review, discussion with nursing staff, consultants, updating family and interview/physical exam; more than 50% of that time was spent in counseling and/or coordination of care.    Walter Blair J [place] DO [facility] Available via Epic secure chat 7am-7pm After these hours, please refer to coverage provider listed on [email or web address] 09/17/2021, 11:47 AM

## 2021-09-17 NOTE — Transfer of Care (Signed)
Immediate Anesthesia Transfer of Care Note  Patient: Salem Deloria  Procedure(s) Performed: ESOPHAGOGASTRODUODENOSCOPY (EGD)  Patient Location: PACU  Anesthesia Type:General  Level of Consciousness: awake, alert  and drowsy  Airway & Oxygen Therapy: Patient Spontanous Breathing and Patient connected to nasal cannula oxygen  Post-op Assessment: Report given to RN and Post -op Vital signs reviewed and stable  Post vital signs: Reviewed and stable  Last Vitals:  Vitals Value Taken Time  BP 109/53 09/17/21 1133  Temp 36.1 C 09/17/21 1129  Pulse 59 09/17/21 1133  Resp 19 09/17/21 1133  SpO2 100 % 09/17/21 1133  Vitals shown include unvalidated device data.  Last Pain:  Vitals:   09/17/21 1129  TempSrc: Tympanic  PainSc: 0-No pain         Complications: No notable events documented.

## 2021-09-18 LAB — BASIC METABOLIC PANEL
Anion gap: 8 (ref 5–15)
BUN: 22 mg/dL (ref 8–23)
CO2: 18 mmol/L — ABNORMAL LOW (ref 22–32)
Calcium: 8 mg/dL — ABNORMAL LOW (ref 8.9–10.3)
Chloride: 106 mmol/L (ref 98–111)
Creatinine, Ser: 1.66 mg/dL — ABNORMAL HIGH (ref 0.61–1.24)
GFR, Estimated: 42 mL/min — ABNORMAL LOW (ref >60)
Glucose, Bld: 98 mg/dL (ref 70–99)
Potassium: 3.8 mmol/L (ref 3.5–5.1)
Sodium: 132 mmol/L — ABNORMAL LOW (ref 135–145)

## 2021-09-18 LAB — CBC
HCT: 23.1 % — ABNORMAL LOW (ref 39.0–52.0)
Hemoglobin: 7.3 g/dL — ABNORMAL LOW (ref 13.0–17.0)
MCH: 28.6 pg (ref 26.0–34.0)
MCHC: 31.6 g/dL (ref 30.0–36.0)
MCV: 90.6 fL (ref 80.0–100.0)
Platelets: 124 10*3/uL — ABNORMAL LOW (ref 150–400)
RBC: 2.55 MIL/uL — ABNORMAL LOW (ref 4.22–5.81)
RDW: 18.6 % — ABNORMAL HIGH (ref 11.5–15.5)
WBC: 3.8 10*3/uL — ABNORMAL LOW (ref 4.0–10.5)
nRBC: 0 % (ref 0.0–0.2)

## 2021-09-18 LAB — GLUCOSE, CAPILLARY
Glucose-Capillary: 101 mg/dL — ABNORMAL HIGH (ref 70–99)
Glucose-Capillary: 142 mg/dL — ABNORMAL HIGH (ref 70–99)
Glucose-Capillary: 168 mg/dL — ABNORMAL HIGH (ref 70–99)
Glucose-Capillary: 94 mg/dL (ref 70–99)

## 2021-09-18 LAB — PREPARE RBC (CROSSMATCH)

## 2021-09-18 MED ORDER — SODIUM CHLORIDE 0.9% IV SOLUTION: Freq: Once | INTRAVENOUS | Status: AC

## 2021-09-18 MED ORDER — INSULIN ASPART 100 UNIT/ML IJ SOLN
0.0000 [IU] | Freq: Three times a day (TID) | INTRAMUSCULAR | Status: DC
Start: 2021-09-18 — End: 2021-09-19

## 2021-09-18 NOTE — Progress Notes (Signed)
PROGRESS NOTE    Walter Blair  IHK:[id number] DOB: [birth year] DOA: 09/14/2021 PCP: Jodi Marble, MD    Brief Narrative:  Walter Blair is a 75 year old male with past medical history significant for chronic combined systolic/diastolic congestive heart failure, paroxysmal atrial fibrillation, CKD stage IIIa, GERD, essential hypertension, hypothyroidism, iron deficiency anemia, type 2 diabetes mellitus, history of CVA who presented to [facility] ED on 12/26 with progressive weakness, fatigue, dizziness, intermittent chest pain with nausea over the past 3 days.  Also reports dark stools for last few days.  In the ED, temperature 97.3 degrees Fahrenheit, HR 60, RR 19, BP 94/51, SPO2 97% on 2 L nasal cannula.  Sodium 132, potassium 5.7, chloride 105, CO2 18, glucose 52, BUN 56, creatinine 2.41, BNP 2570.8, high sensitive troponin 19.  WBC 7.4, hemoglobin 6.5, platelets 214.  COVID-19 PCR negative.  Influenza A/B PCR negative.  Urinalysis unrevealing.  Chest x-ray with chronic bronchitic changes lungs without consolidation/edema.  FOBT positive.  Patient was transfused 1 unit PRBC, 5 heart ML bolus of NS and 16.8 g of Veltassa given for hyperkalemia.  Hospital service consulted for further evaluation and management of acute on chronic anemia secondary to GI bleed in the setting of Xarelto use, hyperkalemia, hypotension.   Assessment & Plan:   Principal Problem:   Generalized weakness Active Problems:   Diabetes (HCC)   HTN (hypertension)   CAD (coronary artery disease)   SOB (shortness of breath)   IDA (iron deficiency anemia)   Acute on chronic systolic CHF (congestive heart failure) (HCC)   Atrial fibrillation, chronic (HCC)   Acute renal failure superimposed on stage 3a chronic kidney disease (HCC)   HLD (hyperlipidemia)   Hypothyroid   Melena   Chest pain   Hypoglycemia   Anemia concerning for upper GI bleed Hx iron deficiency anemia Patient presenting to the ED with progressive  weakness, fatigue, dizziness and started stools over the last 3 days.  Complicated by use of Xarelto outpatient for atrial fibrillation.  FOBT positive with hemoglobin 6.5 on admission.  EGD 12/29 with no significant findings and recommend discontinue PPI.  Received Ferrlecit 200 mg IV daily x2 doses. --Gastroenterology following, appreciate assistance --s/p 2u pRBC 12/27 --Hemoglobin 6.5>>8.2>8.1>7.4>7.6>7.3 --Transfuse 1 unit PRBC today; repeat H&H 2 hours following transfusion --CBC in a.m.  Acute renal failure on CKD stage IIIa Baseline creatinine 1.59 with a GFR 45 on 08/06/2021.  Etiology likely secondary to diabetic nephropathy and hypertensive nephrosclerosis.  Acute decompensation likely secondary to acute blood loss anemia. --Cr 2.41>2.23>1.79>1.66>1.66 --Holding Entresto --S/p 2u pRBC transfusion 12/27 --BMP in a.m.  Hyperkalemia: Resolved Potassium 5.7 on admission.  Likely secondary to acute renal failure in the setting of GI bleed.  Received NS bolus followed by Veltassa. --Nephrology following, patient assistance --K 5.7>>4.5>3.6>3.8 --Holding Entresto and spironolactone --BMP daily  Hypoglycemia Hx Type 2 diabetes mellitus Hemoglobin A1c 5.4, well controlled.  On glyburide 2.5 mg p.o. daily and glipizide XL 2.5 mg p.o. daily at home. --Diabetic educator following, appreciate assistance --Will discontinue glyburide/glipizide on discharge --Consider Tradjenta 5 mg p.o. daily on discharge  Chronic combined systolic/diastolic congestive heart failure, compensated Home medication regimen includes torsemide 20-40 mg p.o. daily as needed, spironolactone 12.5 mg p.o. daily, Entresto 24-26 mg p.o. twice daily.  Chest x-ray with no acute cardiopulmonary disease process on admission. --Holding Entresto, spironolactone, diuretics due to hypotension, hyperkalemia and acute renal failure --Strict I's and O's Daily weights  Paroxysmal atrial fibrillation --Cardiology  following --Xarelto discontinued --Amiodarone 100 mg  p.o. daily --Monitor on telemetry  GERD: Continue PPI drip as above  Essential hypertension Patient was notably hypotensive on admission, likely secondary to acute blood loss anemia as above. --BP 112/64 this morning --Holding home Entresto, spironolactone, torsemide above  Hyperlipidemia: Atorvastatin 80 mg p.o. daily  Hypothyroidism --Levothyroxine 88 mcg p.o. daily  Tobacco use disorder: Counseled on need for complete cessation. --Nicotine patch  Hx CVA --Continue statin   DVT prophylaxis: SCDs Start: 09/14/21 2330   Code Status: Full Code Family Communication: Sister updated at bedside this morning  Disposition Plan:  Level of care: Telemetry Medical Status is: Inpatient  Remains inpatient appropriate because: Transfusing 1 unit PRBC today, needs further surveillance of hemoglobin, hopeful for discharge home tomorrow if hemoglobin stable    Consultants:  Cardiology, [facility], Dr. Enriqueta Shutter Nephrology, Dr. Juleen China Medical oncology/hematology, Dr. Grayland Ormond  Procedures:  EGD 12/29, Dr. Enriqueta Shutter  Antimicrobials:  None   Subjective: Patient seen examined bedside, resting comfortably.  Sister present at bedside.  No complaints this morning.  Hemoglobin is been stable but is very borderline given his cardiac issues at 7.3 this morning.  Discussed transfusing additional units and patient agreeable.  No other questions or concerns at this time.  Denies headache, no visual changes, no chest pain, palpitations, no shortness of breath, no abdominal pain, no fever/chills/night sweats, no nausea/vomiting/diarrhea, no paresthesias.  No acute events overnight per nursing staff.  Hemodynamically stable for transfer to telemetry.  Objective: Vitals:   09/18/21 0220 09/18/21 0747 09/18/21 1120 09/18/21 1121  BP: (!) 103/49 (!) 108/58 (!) 108/55 (!) 108/55  Pulse: (!) 59 (!) 59 60 60  Resp: 16 20 (!) 21 (!)  21  Temp: 97.9 F (36.6 C) 97.8 F (36.6 C) 98.2 F (36.8 C) 98.2 F (36.8 C)  TempSrc:      SpO2: 100% 99%  100%  Weight:      Height:        Intake/Output Summary (Last 24 hours) at 09/18/2021 1151 Last data filed at 09/18/2021 1033 Gross per 24 hour  Intake 240 ml  Output --  Net 240 ml   Filed Weights   09/14/21 2032 09/15/21 2205 09/16/21 0500  Weight: 56.2 kg 60 kg 60.3 kg    Examination:  General exam: Appears calm and comfortable, thin in appearance Respiratory system: Clear to auscultation. Respiratory effort normal.  On room air. Cardiovascular system: S1 & S2 heard, RRR. No JVD, murmurs, rubs, gallops or clicks. No pedal edema. Gastrointestinal system: Abdomen is nondistended, soft and nontender. No organomegaly or masses felt. Normal bowel sounds heard. Central nervous system: Alert and oriented. No focal neurological deficits. Extremities: Symmetric 5 x 5 power. Skin: No rashes, lesions or ulcers Psychiatry: Judgement and insight appear normal. Mood & affect appropriate.     Data Reviewed: I have personally reviewed following labs and imaging studies  CBC: Recent Labs  Lab 09/14/21 2038 09/15/21 0702 09/15/21 2047 09/16/21 0830 09/17/21 0422 09/17/21 1253 09/18/21 0603  WBC 7.4 4.7  --  4.9 4.1  --  3.8*  NEUTROABS 6.7  --   --   --   --   --   --   HGB 6.5* 7.0* 8.2* 8.1* 7.4* 7.6* 7.3*  HCT 20.5* 21.7* 25.6* 25.1* 24.3* 24.2* 23.1*  MCV 95.8 90.4  --  90.3 91.4  --  90.6  PLT 214 132*  --  138* 115*  --  329*   Basic Metabolic Panel: Recent Labs  Lab 09/14/21 2038 09/15/21 [phone]  09/16/21 1133 09/17/21 0422 09/18/21 0603  NA 132* 131* 133* 131* 132*  K 5.7* 5.3* 4.5 3.6 3.8  CL 105 109 108 111 106  CO2 18* 16* 17* 15* 18*  GLUCOSE 52* 56* 139* 78 98  BUN 56* 50* 32* 26* 22  CREATININE 2.41* 2.23* 1.79* 1.66* 1.66*  CALCIUM 9.3 8.1* 8.7* 7.9* 8.0*   GFR: Estimated Creatinine Clearance: 31.7 mL/min (A) (by C-G formula based on SCr  of 1.66 mg/dL (H)). Liver Function Tests: Recent Labs  Lab 09/14/21 2038  AST 34  ALT 22  ALKPHOS 68  BILITOT 0.9  PROT 7.0  ALBUMIN 3.5   No results for input(s): LIPASE, AMYLASE in the last 168 hours. No results for input(s): AMMONIA in the last 168 hours. Coagulation Profile: Recent Labs  Lab 09/15/21 1828  INR 2.1*   Cardiac Enzymes: No results for input(s): CKTOTAL, CKMB, CKMBINDEX, TROPONINI in the last 168 hours. BNP (last 3 results) No results for input(s): PROBNP in the last 8760 hours. HbA1C: No results for input(s): HGBA1C in the last 72 hours.  CBG: Recent Labs  Lab 09/17/21 2330 09/17/21 2352 09/18/21 0522 09/18/21 0754 09/18/21 1122  GLUCAP 59* 116* 101* 94 142*   Lipid Profile: No results for input(s): CHOL, HDL, LDLCALC, TRIG, CHOLHDL, LDLDIRECT in the last 72 hours. Thyroid Function Tests: No results for input(s): TSH, T4TOTAL, FREET4, T3FREE, THYROIDAB in the last 72 hours. Anemia Panel: Recent Labs    09/15/21 1828  VITAMINB12 214  TIBC 301  IRON 42*   Sepsis Labs: No results for input(s): PROCALCITON, LATICACIDVEN in the last 168 hours.  Recent Results (from the past 240 hour(s))  Resp Panel by RT-PCR (Flu A&B, Covid) Nasopharyngeal Swab     Status: None   Collection Time: 09/14/21  8:38 PM   Specimen: Nasopharyngeal Swab; Nasopharyngeal(NP) swabs in vial transport medium  Result Value Ref Range Status   SARS Coronavirus 2 by RT PCR NEGATIVE NEGATIVE Final    Comment: (NOTE) SARS-CoV-2 target nucleic acids are NOT DETECTED.  The SARS-CoV-2 RNA is generally detectable in upper respiratory specimens during the acute phase of infection. The lowest concentration of SARS-CoV-2 viral copies this assay can detect is 138 copies/mL. A negative result does not preclude SARS-Cov-2 infection and should not be used as the sole basis for treatment or other patient management decisions. A negative result may occur with  improper specimen  collection/handling, submission of specimen other than nasopharyngeal swab, presence of viral mutation(s) within the areas targeted by this assay, and inadequate number of viral copies(<138 copies/mL). A negative result must be combined with clinical observations, patient history, and epidemiological information. The expected result is Negative.  Fact Sheet for Patients:  EntrepreneurPulse.com.au  Fact Sheet for Healthcare Providers:  IncredibleEmployment.be  This test is no t yet approved or cleared by the [place] FDA and  has been authorized for detection and/or diagnosis of SARS-CoV-2 by FDA under an Emergency Use Authorization (EUA). This EUA will remain  in effect (meaning this test can be used) for the duration of the COVID-19 declaration under Section 564(b)(1) of the Act, 21 U.S.C.section 360bbb-3(b)(1), unless the authorization is terminated  or revoked sooner.       Influenza A by PCR NEGATIVE NEGATIVE Final   Influenza B by PCR NEGATIVE NEGATIVE Final    Comment: (NOTE) The Xpert Xpress SARS-CoV-2/FLU/RSV plus assay is intended as an aid in the diagnosis of influenza from Nasopharyngeal swab specimens and should not be used as a  sole basis for treatment. Nasal washings and aspirates are unacceptable for Xpert Xpress SARS-CoV-2/FLU/RSV testing.  Fact Sheet for Patients: EntrepreneurPulse.com.au  Fact Sheet for Healthcare Providers: IncredibleEmployment.be  This test is not yet approved or cleared by the [place] FDA and has been authorized for detection and/or diagnosis of SARS-CoV-2 by FDA under an Emergency Use Authorization (EUA). This EUA will remain in effect (meaning this test can be used) for the duration of the COVID-19 declaration under Section 564(b)(1) of the Act, 21 U.S.C. section 360bbb-3(b)(1), unless the authorization is terminated or revoked.  Performed at [facility], [address] [place], [place] [address]   MRSA Next Gen by PCR, Nasal     Status: None   Collection Time: 09/15/21 10:15 PM   Specimen: Nasal Mucosa; Nasal Swab  Result Value Ref Range Status   MRSA by PCR Next Gen NOT DETECTED NOT DETECTED Final    Comment: (NOTE) The GeneXpert MRSA Assay (FDA approved for NASAL specimens only), is one component of a comprehensive MRSA colonization surveillance program. It is not intended to diagnose MRSA infection nor to guide or monitor treatment for MRSA infections. Test performance is not FDA approved in patients less than 65 years old. Performed at [facility], [address], [place], [place] [address]          Radiology Studies: No results found.      Scheduled Meds:  sodium chloride   Intravenous Once   amiodarone  100 mg Oral Daily   atorvastatin  80 mg Oral Daily   Chlorhexidine Gluconate Cloth  6 each Topical Q0600   docusate sodium  100 mg Oral BID   insulin aspart  0-9 Units Subcutaneous Q4H   levothyroxine  88 mcg Oral Q0600   nicotine  14 mg Transdermal Daily   pneumococcal 23 valent vaccine  0.5 mL Intramuscular Tomorrow-1000   sodium chloride flush  3 mL Intravenous Q12H   Continuous Infusions:     LOS: 4 days    Time spent: 36 minutes spent on chart review, discussion with nursing staff, consultants, updating family and interview/physical exam; more than 50% of that time was spent in counseling and/or coordination of care.    Hila Bolding J [place] DO [facility] Available via Epic secure chat 7am-7pm After these hours, please refer to coverage provider listed on [email or web address] 09/18/2021, 11:51 AM

## 2021-09-18 NOTE — Progress Notes (Signed)
Mobility Specialist - Progress Note   09/18/21 1400  Mobility  Activity Ambulated to bathroom  Level of Assistance Standby assist, set-up cues, supervision of patient - no hands on  Assistive Device Front wheel walker  Distance Ambulated (ft) 15 ft  Mobility Out of bed for toileting;Ambulated with assistance in room  Mobility Response Tolerated well  Mobility performed by Mobility specialist  $Mobility charge 1 Mobility    Mobility responded to bed alarm. Upon entry, pt ambulating to bathroom for urinary output. Supervision. Pt returned to bed with alarm set.    Kathee Delton Mobility Specialist 09/18/21, 2:38 PM

## 2021-09-18 NOTE — Progress Notes (Signed)
Occupational Therapy Treatment Patient Details Name: Walter Blair MRN: [id number] DOB: [birth year] Today's Date: 09/18/2021   History of present illness Pt is a 75 yo male that presented to ED for generalized weakness, intermittent chest pain, and nausea for several days. Work up showed hypoglycemia, as well as potential GI bleed, has undergone 1 transfusion. PMH of afib, AICD, CHF, CKD, GERD, HTN, CVA, CABG, DMII.   OT comments  Upon entering the room, pt having just finished toileting needs with RW and supervision for mobility. Pt needing encouragement for participation in this session as pt reports fatigue. OT encouraged pt and he was agreeable. Bed mobility without assistance and pt pulled B socks up without assistance while seated on EOB. Pt standing with supervision and needing cuing for hand placement on RW for side stepping technique in tight spaces with walker. Pt ambulating 200' with RW with supervision and no LOB this session. His vitals remained WFLs while on RA. Pt returning self to bed without assistance. Pt continues to benefit from OT intervention with recommendation for [facility] at discharge to continue to address functional deficits.    Recommendations for follow up therapy are one component of a multi-disciplinary discharge planning process, led by the attending physician.  Recommendations may be updated based on patient status, additional functional criteria and insurance authorization.    Follow Up Recommendations  Home health OT    Assistance Recommended at Discharge Frequent or constant Supervision/Assistance  Equipment Recommendations  BSC/3in1;Tub/shower seat;Other (comment) (RW)       Precautions / Restrictions Precautions Precautions: Fall;ICD/Pacemaker Restrictions Weight Bearing Restrictions: No       Mobility Bed Mobility Overal bed mobility: Modified Independent Bed Mobility: Supine to Sit;Sit to Supine           General bed mobility comments: no physical  assistance provided but pt utilized bedrail as needed    Transfers Overall transfer level: Modified independent Equipment used: Rolling walker (2 wheels) Transfers: Sit to/from Stand Sit to Stand: Supervision           General transfer comment: cued for hand placement     Balance Overall balance assessment: Needs assistance Sitting-balance support: Feet supported Sitting balance-Leahy Scale: Good       Standing balance-Leahy Scale: Fair                             ADL either performed or assessed with clinical judgement   ADL Overall ADL's : Needs assistance/impaired                                       General ADL Comments: supervision for functional transfers with use of RW    Extremity/Trunk Assessment Upper Extremity Assessment Upper Extremity Assessment: Generalized weakness   Lower Extremity Assessment Lower Extremity Assessment: Generalized weakness        Vision Patient Visual Report: No change from baseline            Cognition Arousal/Alertness: Awake/alert Behavior During Therapy: WFL for tasks assessed/performed Overall Cognitive Status: Within Functional Limits for tasks assessed                                 General Comments: pt needed encouragement for participation  Pertinent Vitals/ Pain       Pain Assessment: No/denies pain         Frequency  Min 2X/week        Progress Toward Goals  OT Goals(current goals can now be found in the care plan section)  Progress towards OT goals: Progressing toward goals  Acute Rehab OT Goals Patient Stated Goal: to go home OT Goal Formulation: With patient/family Time For Goal Achievement: 09/30/21 Potential to Achieve Goals: [place] Discharge plan remains appropriate;Frequency remains appropriate       AM-PAC OT "6 Clicks" Daily Activity     Outcome Measure   Help from another person eating meals?: None Help  from another person taking care of personal grooming?: A Little Help from another person toileting, which includes using toliet, bedpan, or urinal?: A Little Help from another person bathing (including washing, rinsing, drying)?: A Little Help from another person to put on and taking off regular upper body clothing?: A Little Help from another person to put on and taking off regular lower body clothing?: A Little 6 Click Score: 19    End of Session Equipment Utilized During Treatment: Rolling walker (2 wheels)  OT Visit Diagnosis: Unsteadiness on feet (R26.81);History of falling (Z91.81)   Activity Tolerance Patient tolerated treatment well   Patient Left in bed;with call bell/phone within reach;with bed alarm set   Nurse Communication Mobility status        Time: 1410-1433 OT Time Calculation (min): 23 min  Charges: OT General Charges $OT Visit: 1 Visit OT Treatments $Therapeutic Activity: 23-37 mins  Darleen Crocker, MS, OTR/L , CBIS ascom [phone]  09/18/21, 3:16 PM

## 2021-09-18 NOTE — Progress Notes (Addendum)
Family at bedside. Patient denies any further episodes of bleeding.  Reports having 1 bowel movement today that he states was hard and did not have any blood.  Denies any further melena.  No abdominal pain.  Hemoglobin has been stable in the last 24 hours with no evidence of active GI bleeding  Abdomen soft nontender nondistended  Risks of repeat colonoscopy given that he had one earlier this year for iron deficiency anemia and melena at that time as well, outweigh any benefits (low risk of underlying malignancy given recent colonoscopy, unlikely to change management at this time), in the absence of active GI bleeding  Patient was advised to follow-up with Dr. Vicente Males in 4 to 6 weeks to monitor hemoglobin and any ongoing symptoms and determine if patient would benefit from any further procedures  If any further bleeding occurs as an inpatient, please page GI on-call for further evaluation  If recurrent bleeding occurs after discharge, patient advised to call [facility] or present to ED  GI service will sign off at this time, please page with any questions or concerns

## 2021-09-18 NOTE — Progress Notes (Signed)
Inpatient Diabetes Program Recommendations  AACE/ADA: New Consensus Statement on Inpatient Glycemic Control   Target Ranges:  Prepandial:   less than 140 mg/dL      Peak postprandial:   less than 180 mg/dL (1-2 hours)      Critically ill patients:  140 - 180 mg/dL    Latest Reference Range & Units 09/18/21 05:22 09/18/21 07:54  Glucose-Capillary 70 - 99 mg/dL 101 (H) 94     Latest Reference Range & Units 09/17/21 09:24 09/17/21 10:40 09/17/21 11:46 09/17/21 12:34 09/17/21 16:35 09/17/21 20:07 09/17/21 23:30 09/17/21 23:52  Glucose-Capillary 70 - 99 mg/dL 70 68 (L) 147 (H) 121 (H) 251 (H) 155 (H) 59 (L) 116 (H)    Review of Glycemic Control  Diabetes history: DM2 Outpatient Diabetes medications: Glyburide 2.5 mg daily, Glipizide XL 2.5 mg daily Current orders for Inpatient glycemic control: Novolog 0-9 units Q4H  Inpatient Diabetes Program Recommendations:    Insulin: Please consider decreasing Novolog correction to 0-6 units Q4H.  Thanks, Barnie Alderman, RN, MSN, CDE Diabetes Coordinator Inpatient Diabetes Program [phone] (Team Pager from 8am to 5pm)

## 2021-09-18 NOTE — Progress Notes (Signed)
Physical Therapy Treatment Patient Details Name: Walter Blair MRN: [id number] DOB: March 18, 1946 Today's Date: 09/18/2021   History of Present Illness Pt is a 75 yo male that presented to ED for generalized weakness, intermittent chest pain, and nausea for several days. Work up showed hypoglycemia, as well as potential GI bleed, has undergone 1 transfusion. PMH of afib, AICD, CHF, CKD, GERD, HTN, CVA, CABG, DMII.    PT Comments    Pt alert, agreeable to PT, family at bedside. The patient demonstrated bed mobility modI with bed rails, and able to perform sit <> Stand transfers with RW and supervision for cueing for safe technique. He was able to ambulate ~161ft with RW and CGA. No LOB noted, HR and spO2 WFLs. Pt did appear mildly fatigued at end of ambulation. Up in chair with all needs in reach. The patient would benefit from further skilled PT intervention to continue to progress towards goals. Recommendation remains appropriate.      Recommendations for follow up therapy are one component of a multi-disciplinary discharge planning process, led by the attending physician.  Recommendations may be updated based on patient status, additional functional criteria and insurance authorization.  Follow Up Recommendations  Home health PT     Assistance Recommended at Discharge Intermittent Supervision/Assistance  Equipment Recommendations  Rolling walker (2 wheels)    Recommendations for Other Services       Precautions / Restrictions Precautions Precautions: Fall;ICD/Pacemaker Restrictions Weight Bearing Restrictions: No     Mobility  Bed Mobility Overal bed mobility: Modified Independent                  Transfers Overall transfer level: Modified independent Equipment used: Rolling walker (2 wheels) Transfers: Sit to/from Stand Sit to Stand: Supervision           General transfer comment: cued for hand placement    Ambulation/Gait Ambulation/Gait assistance: Min  guard Gait Distance (Feet): 180 Feet Assistive device: Rolling walker (2 wheels)         General Gait Details: no LOB, spO2 and HR WFLs   Stairs             Wheelchair Mobility    Modified Rankin (Stroke Patients Only)       Balance Overall balance assessment: Needs assistance Sitting-balance support: Feet supported Sitting balance-Leahy Scale: Good       Standing balance-Leahy Scale: Fair                              Cognition Arousal/Alertness: Awake/alert Behavior During Therapy: WFL for tasks assessed/performed Overall Cognitive Status: Within Functional Limits for tasks assessed                                          Exercises      General Comments        Pertinent Vitals/Pain Pain Assessment: No/denies pain    Home Living                          Prior Function            PT Goals (current goals can now be found in the care plan section) Progress towards PT goals: Progressing toward goals    Frequency    Min 2X/week      PT Plan Current plan  remains appropriate    Co-evaluation              AM-PAC PT "6 Clicks" Mobility   Outcome Measure  Help needed turning from your back to your side while in a flat bed without using bedrails?: None Help needed moving from lying on your back to sitting on the side of a flat bed without using bedrails?: None Help needed moving to and from a bed to a chair (including a wheelchair)?: None Help needed standing up from a chair using your arms (e.g., wheelchair or bedside chair)?: None Help needed to walk in hospital room?: None Help needed climbing 3-5 steps with a railing? : A Little 6 Click Score: 23    End of Session Equipment Utilized During Treatment: Gait belt Activity Tolerance: Patient tolerated treatment well Patient left: in chair;with chair alarm set;with call bell/phone within reach Nurse Communication: Mobility status PT Visit  Diagnosis: Other abnormalities of gait and mobility (R26.89);Muscle weakness (generalized) (M62.81)     Time: 3568-6168 PT Time Calculation (min) (ACUTE ONLY): 19 min  Charges:  $Therapeutic Exercise: 8-22 mins                     Lieutenant Diego PT, DPT 11:42 AM,09/18/21

## 2021-09-19 LAB — CBC
HCT: 27.8 % — ABNORMAL LOW (ref 39.0–52.0)
Hemoglobin: 8.9 g/dL — ABNORMAL LOW (ref 13.0–17.0)
MCH: 28.3 pg (ref 26.0–34.0)
MCHC: 32 g/dL (ref 30.0–36.0)
MCV: 88.3 fL (ref 80.0–100.0)
Platelets: 109 10*3/uL — ABNORMAL LOW (ref 150–400)
RBC: 3.15 MIL/uL — ABNORMAL LOW (ref 4.22–5.81)
RDW: 19.3 % — ABNORMAL HIGH (ref 11.5–15.5)
WBC: 4.6 10*3/uL (ref 4.0–10.5)
nRBC: 0 % (ref 0.0–0.2)

## 2021-09-19 LAB — GLUCOSE, CAPILLARY
Glucose-Capillary: 121 mg/dL — ABNORMAL HIGH (ref 70–99)
Glucose-Capillary: 119 mg/dL — ABNORMAL HIGH (ref 70–99)
Glucose-Capillary: 127 mg/dL — ABNORMAL HIGH (ref 70–99)

## 2021-09-19 LAB — BASIC METABOLIC PANEL
Anion gap: 6 (ref 5–15)
BUN: 22 mg/dL (ref 8–23)
CO2: 18 mmol/L — ABNORMAL LOW (ref 22–32)
Calcium: 8.2 mg/dL — ABNORMAL LOW (ref 8.9–10.3)
Chloride: 109 mmol/L (ref 98–111)
Creatinine, Ser: 1.45 mg/dL — ABNORMAL HIGH (ref 0.61–1.24)
GFR, Estimated: 50 mL/min — ABNORMAL LOW (ref >60)
Glucose, Bld: 110 mg/dL — ABNORMAL HIGH (ref 70–99)
Potassium: 3.9 mmol/L (ref 3.5–5.1)
Sodium: 133 mmol/L — ABNORMAL LOW (ref 135–145)

## 2021-09-19 LAB — TYPE AND SCREEN
ABO/RH(D): A POS
Antibody Screen: NEGATIVE
Unit division: 0

## 2021-09-19 LAB — BPAM RBC
Blood Product Expiration Date: 202301172359
ISSUE DATE / TIME: 202212301711
Unit Type and Rh: 6200

## 2021-09-19 MED ORDER — FERROUS SULFATE 325 (65 FE) MG PO TABS: 325.0000 mg | ORAL_TABLET | Freq: Every day | ORAL | 2 refills | Status: DC

## 2021-09-19 NOTE — Discharge Summary (Signed)
Physician Discharge Summary  Walter Blair JQB:[id number] DOB: 12/26/1945 DOA: 09/14/2021  PCP: Jodi Marble, MD  Admit date: 09/14/2021 Discharge date: 09/19/2021  Admitted From: Home Home Disposition: Home  Recommendations for Outpatient Follow-up:  Follow up with PCP in 1-2 weeks Follow-up with gastroenterology, Dr. Vicente Males 4-6 weeks Started on iron sulfate 305 mg p.o. daily Discontinued glyburide, glipizide for hypoglycemia  Discontinued Entresto, spironolactone for renal failure and borderline hypotension Please obtain BMP/CBC in one week  Home Health: PT/OT Equipment/Devices: Walker, tub bench, 3 and 1 bedside commode  Discharge Condition: Stable CODE STATUS: Full code Diet recommendation: Heart healthy/consistent carbohydrate diet  History of present illness:  Walter Blair is a 75 year old male with past medical history significant for chronic combined systolic/diastolic congestive heart failure, paroxysmal atrial fibrillation, CKD stage IIIa, GERD, essential hypertension, hypothyroidism, iron deficiency anemia, type 2 diabetes mellitus, history of CVA who presented to [facility] ED on 12/26 with progressive weakness, fatigue, dizziness, intermittent chest pain with nausea over the past 3 days.  Also reports dark stools for last few days.   In the ED, temperature 97.3 degrees Fahrenheit, HR 60, RR 19, BP 94/51, SPO2 97% on 2 L nasal cannula.  Sodium 132, potassium 5.7, chloride 105, CO2 18, glucose 52, BUN 56, creatinine 2.41, BNP 2570.8, high sensitive troponin 19. WBC 7.4, hemoglobin 6.5, platelets 214.  COVID-19 PCR negative.  Influenza A/B PCR negative.  Urinalysis unrevealing.  Chest x-ray with chronic bronchitic changes lungs without consolidation/edema.  FOBT positive.  Patient was transfused 1 unit PRBC, 5 heart ML bolus of NS and 16.8 g of Veltassa given for hyperkalemia.  Hospital service consulted for further evaluation and management of acute on chronic anemia secondary  to GI bleed in the setting of Xarelto use, hyperkalemia, hypotension.  Hospital course:  Anemia concerning for upper GI bleed Hx iron deficiency anemia Patient presenting to the ED with progressive weakness, fatigue, dizziness and started stools over the last 3 days.  Complicated by use of Xarelto outpatient for atrial fibrillation.  FOBT positive with hemoglobin 6.5 on admission.  EGD 12/29 with no significant findings and recommend discontinue PPI.  Received Ferrlecit 200 mg IV daily x2 doses. Patient was transfused total 3 unit PRBCs during hospitalization.  Hemoglobin remained stable and was 8.9 at time of discharge.  Xarelto discontinued.  Continue ferrous sulfate 325 mg p.o. daily.  Recommend repeat CBC 1 week.   Acute renal failure on CKD stage IIIa Baseline creatinine 1.59 with a GFR 45 on 08/06/2021.  Etiology likely secondary to diabetic nephropathy and hypertensive nephrosclerosis.  Acute decompensation likely secondary to acute blood loss anemia.  Creatinine elevated 2.41 on admission.  Entresto/spironolactone discontinued.  Creatinine improved to 1.45 at time of discharge.  Outpatient follow-up with PCP/cardiology.   Hyperkalemia: Resolved Potassium 5.7 on admission.  Likely secondary to acute renal failure in the setting of GI bleed.  Received NS bolus followed by Veltassa.  Nephrology was consulted and followed during hospital course.  Potassium improved to 3.9 at time of discharge.  Entresto and spironolactone was discontinued.  Outpatient follow-up PCP.  Repeat BMP 1 week.   Hypoglycemia Hx Type 2 diabetes mellitus Hemoglobin A1c 5.4, well controlled.  On glyburide 2.5 mg p.o. daily and glipizide XL 2.5 mg p.o. daily at home.  Home glyburide and glipizide discontinued.  May want to consider Tradjenta initiation outpatient.   Chronic combined systolic/diastolic congestive heart failure, compensated Home medication regimen includes torsemide 20-40 mg p.o. daily as needed,  spironolactone 12.5 mg  p.o. daily, Entresto 24-26 mg p.o. twice daily.  Chest x-ray with no acute cardiopulmonary disease process on admission.  Discontinued Entresto, spironolactone, diuretics due to hypotension, hyperkalemia and acute renal failure.  Outpatient follow-up with cardiology.   Paroxysmal atrial fibrillation Cardiology, Dr. Humphrey Rolls consulted; Xarelto discontinued for GI bleed as above.  Continue amiodarone 100mg  p.o. daily.   GERD: Continue PPI drip as above   Essential hypertension Patient was notably hypotensive on admission, likely secondary to acute blood loss anemia as above.  BP 112/68 this morning, discontinued home Entresto, spironolactone, torsemide for now.  Outpatient follow-up with cardiology/PCP.   Hyperlipidemia: Atorvastatin 80 mg p.o. daily   Hypothyroidism Levothyroxine 88 mcg p.o. daily   Tobacco use disorder: Counseled on need for complete cessation.   Hx CVA Continue statin  Discharge Diagnoses:  Active Problems:   Diabetes (HCC)   HTN (hypertension)   CAD (coronary artery disease)   IDA (iron deficiency anemia)   Acute on chronic systolic CHF (congestive heart failure) (HCC)   Atrial fibrillation, chronic (HCC)   Acute renal failure superimposed on stage 3a chronic kidney disease (HCC)   HLD (hyperlipidemia)   Hypothyroid    Discharge Instructions  Discharge Instructions     Call MD for:  difficulty breathing, headache or visual disturbances   Complete by: As directed    Call MD for:  extreme fatigue   Complete by: As directed    Call MD for:  persistant dizziness or light-headedness   Complete by: As directed    Call MD for:  persistant nausea and vomiting   Complete by: As directed    Call MD for:  severe uncontrolled pain   Complete by: As directed    Call MD for:  temperature >100.4   Complete by: As directed    Diet - low sodium heart healthy   Complete by: As directed    Increase activity slowly   Complete by: As directed        Allergies as of 09/19/2021       Reactions   Lorazepam Shortness Of Breath, Other (See Comments)   Pt experienced adverse reaction and was transferred to ICU last time they were given Med Other reaction(s): Other (See Comments) Pt experienced adverse reaction and was transferred to ICU last time they were given Med        Medication List     STOP taking these medications    glipiZIDE 2.5 MG 24 hr tablet Commonly known as: GLUCOTROL XL   glyBURIDE 2.5 MG tablet Commonly known as: DIABETA   sacubitril-valsartan 24-26 MG Commonly known as: ENTRESTO   spironolactone 25 MG tablet Commonly known as: ALDACTONE   torsemide 20 MG tablet Commonly known as: DEMADEX       TAKE these medications    amiodarone 200 MG tablet Commonly known as: PACERONE One tablet twice a day for one week then one tablet daily What changed:  how much to take how to take this when to take this additional instructions   atorvastatin 80 MG tablet Commonly known as: LIPITOR Take 80 mg by mouth daily.   ferrous sulfate 325 (65 FE) MG tablet Commonly known as: FerrouSul Take 1 tablet (325 mg total) by mouth daily with breakfast. Take 1 tablet twice a day every other day for 90 days. What changed:  how much to take how to take this when to take this   folic acid 1 MG tablet Commonly known as: FOLVITE Take 1 mg by mouth  every morning.   levothyroxine 88 MCG tablet Commonly known as: SYNTHROID Take 1 tablet (88 mcg total) by mouth once daily Take on an empty stomach with a glass of water at least 30-60 minutes before breakfast. What changed: Another medication with the same name was removed. Continue taking this medication, and follow the directions you see here.   OneTouch Ultra test strip Generic drug: glucose blood daily.   pantoprazole 20 MG tablet Commonly known as: PROTONIX Take 20 mg by mouth daily.   polyethylene glycol 17 g packet Commonly known as: MIRALAX /  GLYCOLAX Take 17 g by mouth daily as needed.   Vericiguat 5 MG Tabs Take 5 mg by mouth 2 (two) times daily.               Durable Medical Equipment  (From admission, onward)           Start     Ordered   09/19/21 0707  For home use only DME Tub bench  Once        09/19/21 0706   09/19/21 0706  For home use only DME Walker rolling  Once       Question Answer Comment  Walker: With [place] Wheels   Patient needs a walker to treat with the following condition Gait abnormality      09/19/21 0706   09/19/21 0706  For home use only DME 3 n 1  Once        09/19/21 3007            Follow-up Information     Jodi Marble, MD. Schedule an appointment as soon as possible for a visit in 1 week(s).   Specialty: Internal Medicine Contact information: [place] [address] [phone]         [name], MD Follow up in 4 week(s).   Specialty: Gastroenterology Contact information: [address] [place] [address] [phone]                Allergies  Allergen Reactions   Lorazepam Shortness Of Breath and Other (See Comments)    Pt experienced adverse reaction and was transferred to ICU last time they were given Med Other reaction(s): Other (See Comments) Pt experienced adverse reaction and was transferred to ICU last time they were given Med    Consultations: Cardiology, [facility], Dr. Enriqueta Shutter Nephrology, Dr. Juleen China Medical oncology/hematology, Dr. Grayland Ormond   Procedures/Studies: DG Chest 2 View  Result Date: 09/14/2021 CLINICAL DATA:  Shortness of breath, chest pain, weakness, and nausea for 3 days. EXAM: CHEST - 2 VIEW COMPARISON:  07/24/2021 FINDINGS: Postoperative changes in the mediastinum. Cardiac pacemaker. Mild cardiac enlargement. No vascular congestion. There is evidence of peribronchial thickening with central interstitial pattern to the lungs probably representing bronchiectasis  and chronic bronchitis. No developing consolidation or edema. Minimal fluid or thickened pleura along the left costophrenic angle is unchanged since prior study. No pneumothorax. Calcification of the aorta. IMPRESSION: Chronic bronchitic changes in the lungs. Mild fluid or thickened pleura in the left costophrenic angle. No developing consolidation or edema. Electronically Signed   By: Lucienne Capers M.D.   On: 09/14/2021 21:40   ECHOCARDIOGRAM COMPLETE  Result Date: 09/15/2021    ECHOCARDIOGRAM REPORT   Patient Name:   Walter Blair Date of Exam: 09/15/2021 Medical Rec #:  [id number]      Height:       64.0 in Accession #:    [id number]  Weight:       123.9 lb Date of Birth:  [birth year]       BSA:          1.596 m Patient Age:    47 years       BP:           100/62 mmHg Patient Gender: M              HR:           58 bpm. Exam Location:  [facility] Procedure: 2D Echo, Cardiac Doppler and Color Doppler Indications:     CHF-acute systolic 952.84 / X32.44  History:         Patient has prior history of Echocardiogram examinations, most                  recent 07/25/2021. Prior CABG, Arrythmias:Atrial Fibrillation;                  Risk Factors:Diabetes and Hypertension. AICD.  Sonographer:     Sherrie Sport Referring Phys:  [place] Diagnosing Phys: [place]  1. Left ventricular ejection fraction, by estimation, is <20%. The left ventricle has severely decreased function. The left ventricle demonstrates global hypokinesis. The left ventricular internal cavity size was severely dilated. Left ventricular diastolic parameters are consistent with Grade II diastolic dysfunction (pseudonormalization).  2. Right ventricular systolic function is normal. The right ventricular size is severely enlarged.  3. Left atrial size was severely dilated.  4. Right atrial size was severely dilated.  5. The mitral valve is normal in structure. Moderate to severe mitral valve regurgitation. No evidence of mitral  stenosis.  6. Tricuspid valve regurgitation is moderate.  7. The aortic valve is normal in structure. Aortic valve regurgitation is not visualized. No aortic stenosis is present.  8. The inferior vena cava is normal in size with greater than 50% respiratory variability, suggesting right atrial pressure of 3 mmHg. Conclusion(s)/Recommendation(s): Findings consistent with ischemic cardiomyopathy. FINDINGS  Left Ventricle: Left ventricular ejection fraction, by estimation, is <20%. The left ventricle has severely decreased function. The left ventricle demonstrates global hypokinesis. The left ventricular internal cavity size was severely dilated. There is no left ventricular hypertrophy. Left ventricular diastolic parameters are consistent with Grade II diastolic dysfunction (pseudonormalization). Right Ventricle: The right ventricular size is severely enlarged. No increase in right ventricular wall thickness. Right ventricular systolic function is normal. Left Atrium: Left atrial size was severely dilated. Right Atrium: Right atrial size was severely dilated. Pericardium: There is no evidence of pericardial effusion. Mitral Valve: The mitral valve is normal in structure. Moderate to severe mitral valve regurgitation. No evidence of mitral valve stenosis. MV peak gradient, 8.8 mmHg. The mean mitral valve gradient is 4.0 mmHg. Tricuspid Valve: The tricuspid valve is normal in structure. Tricuspid valve regurgitation is moderate . No evidence of tricuspid stenosis. Aortic Valve: The aortic valve is normal in structure. Aortic valve regurgitation is not visualized. No aortic stenosis is present. Aortic valve mean gradient measures 2.0 mmHg. Aortic valve peak gradient measures 3.7 mmHg. Aortic valve area, by VTI measures 1.81 cm. Pulmonic Valve: The pulmonic valve was normal in structure. Pulmonic valve regurgitation is mild. No evidence of pulmonic stenosis. Aorta: The aortic root is normal in size and structure. Venous:  The inferior vena cava is normal in size with greater than 50% respiratory variability, suggesting right atrial pressure of 3 mmHg. IAS/Shunts: No atrial level shunt detected by color flow Doppler.  LEFT VENTRICLE PLAX 2D LVIDd:         5.40 cm LVIDs:         4.90 cm LV PW:         1.20 cm LV IVS:        0.60 cm LVOT diam:     2.00 cm LV SV:         29 LV SV Index:   18 LVOT Area:     3.14 cm  LV Volumes (MOD) LV vol d, MOD A2C: 153.0 ml LV vol d, MOD A4C: 168.0 ml LV vol s, MOD A2C: 113.0 ml LV vol s, MOD A4C: 128.0 ml LV SV MOD A2C:     40.0 ml LV SV MOD A4C:     168.0 ml LV SV MOD BP:      40.6 ml RIGHT VENTRICLE RV Basal diam:  5.60 cm RV S prime:     9.36 cm/s TAPSE (M-mode): 4.1 cm LEFT ATRIUM             Index        RIGHT ATRIUM           Index LA diam:        4.60 cm 2.88 cm/m   RA Area:     27.70 cm LA Vol (A2C):   86.1 ml 53.94 ml/m  RA Volume:   97.40 ml  61.02 ml/m LA Vol (A4C):   90.2 ml 56.51 ml/m LA Biplane Vol: 91.1 ml 57.08 ml/m  AORTIC VALVE                    PULMONIC VALVE AV Area (Vmax):    1.40 cm     PV Vmax:        0.48 m/s AV Area (Vmean):   1.50 cm     PV Vmean:       33.300 cm/s AV Area (VTI):     1.81 cm     PV VTI:         0.078 m AV Vmax:           96.65 cm/s   PV Peak grad:   0.9 mmHg AV Vmean:          62.900 cm/s  PV Mean grad:   1.0 mmHg AV VTI:            0.160 m      RVOT Peak grad: 4 mmHg AV Peak Grad:      3.7 mmHg AV Mean Grad:      2.0 mmHg LVOT Vmax:         43.10 cm/s LVOT Vmean:        30.000 cm/s LVOT VTI:          0.092 m LVOT/AV VTI ratio: 0.58  AORTA Ao Root diam: 3.00 cm MITRAL VALVE                TRICUSPID VALVE MV Area (PHT): 6.27 cm     TR Peak grad:   37.0 mmHg MV Area VTI:   1.53 cm     TR Vmax:        304.00 cm/s MV Peak grad:  8.8 mmHg MV Mean grad:  4.0 mmHg     SHUNTS MV Vmax:       1.48 m/s     Systemic VTI:  0.09 m MV Vmean:      94.4 cm/s    Systemic Diam: 2.00 cm MV Decel Time:  121 msec     Pulmonic VTI:  0.180 m MV E velocity: 114.00 cm/s  Neoma Laming Electronically signed by Neoma Laming Signature Date/Time: 09/15/2021/1:16:32 PM    Final      Subjective: Patient seen examined at bedside, resting comfortably.  Nephew present at bedside.  Sitting in bedside chair eating breakfast.  No complaints this morning.  Hemoglobin stable and ready for discharge home.  No other questions or concerns at this time.  Denies headache, no visual changes, no chest pain, palpitations, no shortness of breath, no abdominal pain, no weakness, no cough/congestion, no fever/chills/night sweats, no nausea/vomiting/diarrhea, no paresthesias.  No acute events overnight per nurses note.  Discharge Exam: Vitals:   09/19/21 0327 09/19/21 0812  BP: 112/68 124/64  Pulse: 97 (!) 59  Resp: 15 16  Temp: 98.4 F (36.9 C) 97.6 F (36.4 C)  SpO2: 100% 99%   Vitals:   09/18/21 2326 09/19/21 0327 09/19/21 0500 09/19/21 0812  BP: 115/76 112/68  124/64  Pulse: 60 97  (!) 59  Resp: 14 15  16   Temp: 98.3 F (36.8 C) 98.4 F (36.9 C)  97.6 F (36.4 C)  TempSrc:      SpO2: 100% 100%  99%  Weight:   63.8 kg   Height:        General: Pt is alert, awake, not in acute distress Cardiovascular: RRR, S1/S2 +, no rubs, no gallops Respiratory: CTA bilaterally, no wheezing, no rhonchi, on room air Abdominal: Soft, NT, ND, bowel sounds + Extremities: no edema, no cyanosis    The results of significant diagnostics from this hospitalization (including imaging, microbiology, ancillary and laboratory) are listed below for reference.     Microbiology: Recent Results (from the past 240 hour(s))  Resp Panel by RT-PCR (Flu A&B, Covid) Nasopharyngeal Swab     Status: None   Collection Time: 09/14/21  8:38 PM   Specimen: Nasopharyngeal Swab; Nasopharyngeal(NP) swabs in vial transport medium  Result Value Ref Range Status   SARS Coronavirus 2 by RT PCR NEGATIVE NEGATIVE Final    Comment: (NOTE) SARS-CoV-2 target nucleic acids are NOT DETECTED.  The SARS-CoV-2 RNA  is generally detectable in upper respiratory specimens during the acute phase of infection. The lowest concentration of SARS-CoV-2 viral copies this assay can detect is 138 copies/mL. A negative result does not preclude SARS-Cov-2 infection and should not be used as the sole basis for treatment or other patient management decisions. A negative result may occur with  improper specimen collection/handling, submission of specimen other than nasopharyngeal swab, presence of viral mutation(s) within the areas targeted by this assay, and inadequate number of viral copies(<138 copies/mL). A negative result must be combined with clinical observations, patient history, and epidemiological information. The expected result is Negative.  Fact Sheet for Patients:  EntrepreneurPulse.com.au  Fact Sheet for Healthcare Providers:  IncredibleEmployment.be  This test is no t yet approved or cleared by the [place] FDA and  has been authorized for detection and/or diagnosis of SARS-CoV-2 by FDA under an Emergency Use Authorization (EUA). This EUA will remain  in effect (meaning this test can be used) for the duration of the COVID-19 declaration under Section 564(b)(1) of the Act, 21 U.S.C.section 360bbb-3(b)(1), unless the authorization is terminated  or revoked sooner.       Influenza A by PCR NEGATIVE NEGATIVE Final   Influenza B by PCR NEGATIVE NEGATIVE Final    Comment: (NOTE) The Xpert Xpress SARS-CoV-2/FLU/RSV plus assay is intended as an aid in  the diagnosis of influenza from Nasopharyngeal swab specimens and should not be used as a sole basis for treatment. Nasal washings and aspirates are unacceptable for Xpert Xpress SARS-CoV-2/FLU/RSV testing.  Fact Sheet for Patients: EntrepreneurPulse.com.au  Fact Sheet for Healthcare Providers: IncredibleEmployment.be  This test is not yet approved or cleared by the  [place] FDA and has been authorized for detection and/or diagnosis of SARS-CoV-2 by FDA under an Emergency Use Authorization (EUA). This EUA will remain in effect (meaning this test can be used) for the duration of the COVID-19 declaration under Section 564(b)(1) of the Act, 21 U.S.C. section 360bbb-3(b)(1), unless the authorization is terminated or revoked.  Performed at [facility], [address] [place], [place] [address]   MRSA Next Gen by PCR, Nasal     Status: None   Collection Time: 09/15/21 10:15 PM   Specimen: Nasal Mucosa; Nasal Swab  Result Value Ref Range Status   MRSA by PCR Next Gen NOT DETECTED NOT DETECTED Final    Comment: (NOTE) The GeneXpert MRSA Assay (FDA approved for NASAL specimens only), is one component of a comprehensive MRSA colonization surveillance program. It is not intended to diagnose MRSA infection nor to guide or monitor treatment for MRSA infections. Test performance is not FDA approved in patients less than 45 years old. Performed at [facility], [address] [place], [place] [address]      Labs: BNP (last 3 results) Recent Labs    07/24/21 1011 09/14/21 2038  BNP 3,305.3* 6,063.0*   Basic Metabolic Panel: Recent Labs  Lab 09/15/21 0702 09/16/21 1133 09/17/21 0422 09/18/21 0603 09/19/21 0520  NA 131* 133* 131* 132* 133*  K 5.3* 4.5 3.6 3.8 3.9  CL 109 108 111 106 109  CO2 16* 17* 15* 18* 18*  GLUCOSE 56* 139* 78 98 110*  BUN 50* 32* 26* 22 22  CREATININE 2.23* 1.79* 1.66* 1.66* 1.45*  CALCIUM 8.1* 8.7* 7.9* 8.0* 8.2*   Liver Function Tests: Recent Labs  Lab 09/14/21 2038  AST 34  ALT 22  ALKPHOS 68  BILITOT 0.9  PROT 7.0  ALBUMIN 3.5   No results for input(s): LIPASE, AMYLASE in the last 168 hours. No results for input(s): AMMONIA in the last 168 hours. CBC: Recent Labs  Lab 09/14/21 2038 09/15/21 0702 09/15/21 2047 09/16/21 0830 09/17/21 0422 09/17/21 1253 09/18/21 0603  09/19/21 0520  WBC 7.4 4.7  --  4.9 4.1  --  3.8* 4.6  NEUTROABS 6.7  --   --   --   --   --   --   --   HGB 6.5* 7.0*   < > 8.1* 7.4* 7.6* 7.3* 8.9*  HCT 20.5* 21.7*   < > 25.1* 24.3* 24.2* 23.1* 27.8*  MCV 95.8 90.4  --  90.3 91.4  --  90.6 88.3  PLT 214 132*  --  138* 115*  --  124* 109*   < > = values in this interval not displayed.   Cardiac Enzymes: No results for input(s): CKTOTAL, CKMB, CKMBINDEX, TROPONINI in the last 168 hours. BNP: Invalid input(s): POCBNP CBG: Recent Labs  Lab 09/18/21 1122 09/18/21 2354 09/19/21 0328 09/19/21 0542 09/19/21 0815  GLUCAP 142* 168* 121* 119* 127*   D-Dimer No results for input(s): DDIMER in the last 72 hours. Hgb A1c No results for input(s): HGBA1C in the last 72 hours. Lipid Profile No results for input(s): CHOL, HDL, LDLCALC, TRIG, CHOLHDL, LDLDIRECT in the last 72 hours. Thyroid function  studies No results for input(s): TSH, T4TOTAL, T3FREE, THYROIDAB in the last 72 hours.  Invalid input(s): FREET3 Anemia work up No results for input(s): VITAMINB12, FOLATE, FERRITIN, TIBC, IRON, RETICCTPCT in the last 72 hours. Urinalysis    Component Value Date/Time   COLORURINE YELLOW 09/14/2021 0727   APPEARANCEUR CLEAR 09/14/2021 0727   APPEARANCEUR Cloudy (A) 08/05/2021 1307   LABSPEC 1.015 09/14/2021 0727   PHURINE 5.0 09/14/2021 0727   GLUCOSEU NEGATIVE 09/14/2021 0727   HGBUR NEGATIVE 09/14/2021 0727   BILIRUBINUR NEGATIVE 09/14/2021 0727   BILIRUBINUR Negative 08/05/2021 [place] 09/14/2021 0727   PROTEINUR 30 (A) 09/14/2021 0727   NITRITE NEGATIVE 09/14/2021 0727   LEUKOCYTESUR SMALL (A) 09/14/2021 0727   Sepsis Labs Invalid input(s): PROCALCITONIN,  WBC,  LACTICIDVEN Microbiology Recent Results (from the past 240 hour(s))  Resp Panel by RT-PCR (Flu A&B, Covid) Nasopharyngeal Swab     Status: None   Collection Time: 09/14/21  8:38 PM   Specimen: Nasopharyngeal Swab; Nasopharyngeal(NP) swabs in vial  transport medium  Result Value Ref Range Status   SARS Coronavirus 2 by RT PCR NEGATIVE NEGATIVE Final    Comment: (NOTE) SARS-CoV-2 target nucleic acids are NOT DETECTED.  The SARS-CoV-2 RNA is generally detectable in upper respiratory specimens during the acute phase of infection. The lowest concentration of SARS-CoV-2 viral copies this assay can detect is 138 copies/mL. A negative result does not preclude SARS-Cov-2 infection and should not be used as the sole basis for treatment or other patient management decisions. A negative result may occur with  improper specimen collection/handling, submission of specimen other than nasopharyngeal swab, presence of viral mutation(s) within the areas targeted by this assay, and inadequate number of viral copies(<138 copies/mL). A negative result must be combined with clinical observations, patient history, and epidemiological information. The expected result is Negative.  Fact Sheet for Patients:  EntrepreneurPulse.com.au  Fact Sheet for Healthcare Providers:  IncredibleEmployment.be  This test is no t yet approved or cleared by the [place] FDA and  has been authorized for detection and/or diagnosis of SARS-CoV-2 by FDA under an Emergency Use Authorization (EUA). This EUA will remain  in effect (meaning this test can be used) for the duration of the COVID-19 declaration under Section 564(b)(1) of the Act, 21 U.S.C.section 360bbb-3(b)(1), unless the authorization is terminated  or revoked sooner.       Influenza A by PCR NEGATIVE NEGATIVE Final   Influenza B by PCR NEGATIVE NEGATIVE Final    Comment: (NOTE) The Xpert Xpress SARS-CoV-2/FLU/RSV plus assay is intended as an aid in the diagnosis of influenza from Nasopharyngeal swab specimens and should not be used as a sole basis for treatment. Nasal washings and aspirates are unacceptable for Xpert Xpress SARS-CoV-2/FLU/RSV testing.  Fact  Sheet for Patients: EntrepreneurPulse.com.au  Fact Sheet for Healthcare Providers: IncredibleEmployment.be  This test is not yet approved or cleared by the [place] FDA and has been authorized for detection and/or diagnosis of SARS-CoV-2 by FDA under an Emergency Use Authorization (EUA). This EUA will remain in effect (meaning this test can be used) for the duration of the COVID-19 declaration under Section 564(b)(1) of the Act, 21 U.S.C. section 360bbb-3(b)(1), unless the authorization is terminated or revoked.  Performed at [facility], [address] [place], [place] [address]   MRSA Next Gen by PCR, Nasal     Status: None   Collection Time: 09/15/21 10:15 PM   Specimen: Nasal Mucosa; Nasal Swab  Result Value Ref  Range Status   MRSA by PCR Next Gen NOT DETECTED NOT DETECTED Final    Comment: (NOTE) The GeneXpert MRSA Assay (FDA approved for NASAL specimens only), is one component of a comprehensive MRSA colonization surveillance program. It is not intended to diagnose MRSA infection nor to guide or monitor treatment for MRSA infections. Test performance is not FDA approved in patients less than 56 years old. Performed at [facility], [address] [place], [place] [address]      Time coordinating discharge: Over 30 minutes  SIGNED:   Karsten Vaughn J [place] DO  [facility] 09/19/2021, 10:34 AM

## 2021-09-19 NOTE — TOC Progression Note (Addendum)
Transition of Care [facility]) - Progression Note    Patient Details  Name: Walter Blair MRN: [id number] Date of Birth: Oct 24, 1945  [facility]) CM/SW [place], LCSW Phone Number: 09/19/2021, 10:27 AM  Clinical Narrative:    Patient confirms he does not have a 3 in 1 or RW and wants these to be ordered. Referral made to [facility] with [employer]. DME to be delivered to bedside prior to DC.  Notified Jason with [facility] of DC orders for today.   Expected Discharge Plan: [place] Barriers to Discharge: Continued Medical Work up  Expected Discharge Plan and Services Expected Discharge Plan: [place]   Discharge Planning Services: CM Consult Post Acute Care Choice: [place] arrangements for the past 2 months: Single Family Home                 DME Arranged: N/A DME Agency: NA       HH Arranged: RN, PT [place] Agency: [place] ([place]) Date [place]: 09/16/21 Time [place]: 1129 Representative spoke with at [place]: [place] ([place]) Interventions    Readmission Risk Interventions No flowsheet data found.

## 2021-09-24 ENCOUNTER — Other Ambulatory Visit: Payer: Medicare Other | Admitting: Nurse Practitioner

## 2021-09-25 ENCOUNTER — Encounter: Payer: Self-pay | Admitting: Nurse Practitioner

## 2021-09-25 ENCOUNTER — Other Ambulatory Visit: Payer: Medicare Other | Admitting: Nurse Practitioner

## 2021-09-25 ENCOUNTER — Emergency Department: Payer: Medicare Other

## 2021-09-25 ENCOUNTER — Other Ambulatory Visit: Payer: Self-pay

## 2021-09-25 VITALS — HR 72 | Resp 22

## 2021-09-25 DIAGNOSIS — I2510 Atherosclerotic heart disease of native coronary artery without angina pectoris: Secondary | ICD-10-CM | POA: Diagnosis present

## 2021-09-25 DIAGNOSIS — Z79890 Hormone replacement therapy (postmenopausal): Secondary | ICD-10-CM

## 2021-09-25 DIAGNOSIS — R0602 Shortness of breath: Secondary | ICD-10-CM

## 2021-09-25 DIAGNOSIS — Z8673 Personal history of transient ischemic attack (TIA), and cerebral infarction without residual deficits: Secondary | ICD-10-CM

## 2021-09-25 DIAGNOSIS — I480 Paroxysmal atrial fibrillation: Secondary | ICD-10-CM | POA: Diagnosis present

## 2021-09-25 DIAGNOSIS — N1831 Chronic kidney disease, stage 3a: Secondary | ICD-10-CM | POA: Diagnosis present

## 2021-09-25 DIAGNOSIS — K219 Gastro-esophageal reflux disease without esophagitis: Secondary | ICD-10-CM | POA: Diagnosis present

## 2021-09-25 DIAGNOSIS — D696 Thrombocytopenia, unspecified: Secondary | ICD-10-CM | POA: Diagnosis present

## 2021-09-25 DIAGNOSIS — Z8249 Family history of ischemic heart disease and other diseases of the circulatory system: Secondary | ICD-10-CM

## 2021-09-25 DIAGNOSIS — E1122 Type 2 diabetes mellitus with diabetic chronic kidney disease: Secondary | ICD-10-CM | POA: Diagnosis present

## 2021-09-25 DIAGNOSIS — Z87891 Personal history of nicotine dependence: Secondary | ICD-10-CM

## 2021-09-25 DIAGNOSIS — Z20822 Contact with and (suspected) exposure to covid-19: Secondary | ICD-10-CM | POA: Diagnosis present

## 2021-09-25 DIAGNOSIS — Z79899 Other long term (current) drug therapy: Secondary | ICD-10-CM

## 2021-09-25 DIAGNOSIS — Z95810 Presence of automatic (implantable) cardiac defibrillator: Secondary | ICD-10-CM

## 2021-09-25 DIAGNOSIS — I255 Ischemic cardiomyopathy: Secondary | ICD-10-CM | POA: Diagnosis present

## 2021-09-25 DIAGNOSIS — I130 Hypertensive heart and chronic kidney disease with heart failure and stage 1 through stage 4 chronic kidney disease, or unspecified chronic kidney disease: Principal | ICD-10-CM | POA: Diagnosis present

## 2021-09-25 DIAGNOSIS — E039 Hypothyroidism, unspecified: Secondary | ICD-10-CM | POA: Diagnosis present

## 2021-09-25 DIAGNOSIS — Z515 Encounter for palliative care: Secondary | ICD-10-CM

## 2021-09-25 DIAGNOSIS — I5042 Chronic combined systolic (congestive) and diastolic (congestive) heart failure: Secondary | ICD-10-CM

## 2021-09-25 DIAGNOSIS — E785 Hyperlipidemia, unspecified: Secondary | ICD-10-CM | POA: Diagnosis present

## 2021-09-25 DIAGNOSIS — I700 Atherosclerosis of aorta: Secondary | ICD-10-CM | POA: Diagnosis present

## 2021-09-25 DIAGNOSIS — I5043 Acute on chronic combined systolic (congestive) and diastolic (congestive) heart failure: Secondary | ICD-10-CM | POA: Diagnosis present

## 2021-09-25 DIAGNOSIS — Z888 Allergy status to other drugs, medicaments and biological substances status: Secondary | ICD-10-CM

## 2021-09-25 DIAGNOSIS — Z951 Presence of aortocoronary bypass graft: Secondary | ICD-10-CM

## 2021-09-25 DIAGNOSIS — Z833 Family history of diabetes mellitus: Secondary | ICD-10-CM

## 2021-09-25 LAB — TROPONIN I (HIGH SENSITIVITY)
Troponin I (High Sensitivity): 18 ng/L — ABNORMAL HIGH (ref <18)
Troponin I (High Sensitivity): 18 ng/L — ABNORMAL HIGH (ref <18)

## 2021-09-25 LAB — CBC
HCT: 32.1 % — ABNORMAL LOW (ref 39.0–52.0)
Hemoglobin: 9.8 g/dL — ABNORMAL LOW (ref 13.0–17.0)
MCH: 27.7 pg (ref 26.0–34.0)
MCHC: 30.5 g/dL (ref 30.0–36.0)
MCV: 90.7 fL (ref 80.0–100.0)
Platelets: 127 10*3/uL — ABNORMAL LOW (ref 150–400)
RBC: 3.54 MIL/uL — ABNORMAL LOW (ref 4.22–5.81)
RDW: 17.4 % — ABNORMAL HIGH (ref 11.5–15.5)
WBC: 4.1 10*3/uL (ref 4.0–10.5)
nRBC: 0 % (ref 0.0–0.2)

## 2021-09-25 LAB — BASIC METABOLIC PANEL
Anion gap: 6 (ref 5–15)
BUN: 21 mg/dL (ref 8–23)
CO2: 21 mmol/L — ABNORMAL LOW (ref 22–32)
Calcium: 8.6 mg/dL — ABNORMAL LOW (ref 8.9–10.3)
Chloride: 105 mmol/L (ref 98–111)
Creatinine, Ser: 1.4 mg/dL — ABNORMAL HIGH (ref 0.61–1.24)
GFR, Estimated: 52 mL/min — ABNORMAL LOW (ref >60)
Glucose, Bld: 124 mg/dL — ABNORMAL HIGH (ref 70–99)
Potassium: 4.4 mmol/L (ref 3.5–5.1)
Sodium: 132 mmol/L — ABNORMAL LOW (ref 135–145)

## 2021-09-25 LAB — BRAIN NATRIURETIC PEPTIDE: B Natriuretic Peptide: 4358.6 pg/mL — ABNORMAL HIGH (ref 0.0–100.0)

## 2021-09-25 NOTE — ED Notes (Signed)
Green top sent to lab

## 2021-09-25 NOTE — Progress Notes (Signed)
[facility] [facility] Palliative Care Consult Note Telephone: [phone]  Fax: [phone]   Date of encounter: 09/25/21 4:18 PM PATIENT NAME: Walter Blair [place] [address]   [phone] (home)  DOB: [birth year] MRN: [id number] PRIMARY CARE PROVIDER:    Jodi Marble, MD,  [place] [address] [phone]  REFERRING PROVIDER:   Jodi Marble, MD [place],  [place] [address] [phone]  RESPONSIBLE PARTY:    Contact Information     Name Relation Home Work Sher[place] Brother [phone]  [phone]   Dala Dock   [phone]      I met face to face with patient and family in home. Palliative Care was asked to follow this patient by consultation request of  Jodi Marble, MD to address advance care planning and complex medical decision making. This is the initial visit.   ASSESSMENT AND PLAN / RECOMMENDATIONS:  Advance Care Planning/Goals of Care: Goals include to maximize quality of life and symptom management. Patient/health care surrogate gave his/her permission to discuss.Our advance care planning conversation included a discussion about:    The value and importance of advance care planning  Experiences with loved ones who have been seriously ill or have died  Exploration of personal, cultural or spiritual beliefs that might influence medical decisions  Exploration of goals of care in the event of a sudden injury or illness  Identification  of a healthcare agent  Review and updating or creation of an  advance directive document . Decision not to resuscitate or to de-escalate disease focused treatments due to poor prognosis. CODE STATUS: DNR  Symptom Management/Plan: 1. Advance Care Planning; Discussed HCPOA, his brother Hilliard Clark. Discussed code status and wishes are to be a DNR; golden rod form completed and will place in vynca. Warden Fillers and I talked  extensively about possibility of Hospice services under Medicare benefit. Warden Fillers endorses he was not ready. We talked about goals to minimize hospitalizations if possible. Warden Fillers endorses family dynamics with multiple physicians in the immediate family expressed it is hard when its your brother to make decisions. Emotional support provided. Warden Fillers endorses he was going out of the country tomorrow, was going to take Mr Maish to his son's home to be cared for this next week, then bring him back.   2. Shortness of breath secondary to CHF. Currently appears to have exacerbated with clinical presentation with rales, edema BLE. We talked at length about goc, comfort vs aggressive treatment. Khaliq talked at length about recent Cardiology appointment with recent hospitalization, changing medications with discontinuing many. Reviewed medications. We talked about Torsemide. We talked about upcoming Endocrinology appointment at 11am today, currently 9:45am. We talked about concern for requirement of IV diuretics. Mr. Crampton endorses when he sits his breathing goes back to normal. We talked about shortness of breath with exertion, ambulating. We talked about CHF, chronic disease. We talked about options of going to Endocrinology appointment early since at current time Mr. Baird Cancer appears comfortable, Warden Fillers endorses this is his baseline to see if he requires admission rather than going to ED. We talked about going directly to ED. Warden Fillers and Mr. Tsuda endorses his wishes are to go to Endocrinology appointment as Dr Gabriel Carina has been treating Mr. Delahunt for quite some time now. We talked about ros, denies pain. We talked about decreased appetite with weight. We talked about role of pc in poc. Warden Fillers and I talked extensively prior to Mr. Memmer  joining visit about the last time he was independent, family, social hx reviewed, life reviewed as Mr. Perea is widowed and has resided with [place] for last 5 years. We talked  about HCPOA, reviewed medical goals. We talked about Hospice benefit under Medicare. Warden Fillers endorses he and Mr. Ysaguirre were familiar with Hospice as their mother who passed about a year ago was under Hospice services. We talked about difference between Hospice and PC. We talked about f/u pc visit scheduled in 4 weeks for NP, will have PC RN see in 2 week with close monitoring as he is likely to be re-hospitalized as he is high risk.   3. Goals of Care: Goals include to maximize quality of life and symptom management. Our advance care planning conversation included a discussion about:    The value and importance of advance care planning  Exploration of personal, cultural or spiritual beliefs that might influence medical decisions  Exploration of goals of care in the event of a sudden injury or illness  Identification and preparation of a healthcare agent  Review and updating or creation of an advance directive document  4. Palliative care encounter; Palliative care encounter; Palliative medicine team will continue to support patient, patient's family, and medical team. Visit consisted of counseling and education dealing with the complex and emotionally intense issues of symptom management and palliative care in the setting of serious and potentially life-threatening illness  5. f/u 1 month for ongoing monitoring chronic disease progression, ongoing discussions complex medical decision making  Follow up Palliative Care Visit: Palliative care will continue to follow for complex medical decision making, advance care planning, and clarification of goals. Return 2 with PC RN and then 4 weeks with PC NP weeks or prn.  I spent 62 minutes providing this consultation. More than 50% of the time in this consultation was spent in counseling and care coordination.  PPS: 40%  Chief Complaint: Initial palliative consult for complex medical decision making  HISTORY OF PRESENT ILLNESS:  Walter Blair is a 76  y.o. year old male  with multiple medical problems including chronic combined systolic/diastolic congestive heart failure, paroxysmal atrial fibrillation, CKD stage IIIa, GERD, essential hypertension, hypothyroidism, iron deficiency anemia, type 2 diabetes mellitus, history of CVA. Hospitalized 09/14/2021 to 09/19/2021 for anemia concerning for upper GI bleed with hx IDA; required 3 units PRBC's; Xarelto discontinued, continued ferrous sulfate. Acute renal filure on CKD stage IIIa with etiology likely secondary to diabetic nephropathy and hypertensive nephrosclerosis. Hyperkalemia, resolved. Hypoglycemia d/c glyburide and glipizide. Chronic systolic/diastolic CHF compensated; discontinued entresto, spironolactone, diuretics due to hypotension, hyperkalemia, acute renal failure. Paroxysmal afib continued amiodarone. [facility]charged home with [facility]. I called Khaliq to confirm PC initial consult and covid screening negative. I visited [place] in his home with initial discussion for history, review concerns then Mr. Frasier joined visit.   History obtained from review of EMR, discussion with Brother, Hilliard Clark and  Mr. Murguia.  I reviewed available labs, medications, imaging, studies and related documents from the EMR.  Records reviewed and summarized above.   ROS Reviewed 10 point system all negative except HPI  Physical Exam: Constitutional: mild respiratory distress with exertion then resolved with rest General: frail appearing, thin, pleasant male EYES:  lids intact ENMT: oral mucous membranes moist CV: S1S2, RRR, +BLE edema left >right  Pulmonary: Rales bases to mild; + cough, room air Abdomen: normo-active BS + 4 quadrants, soft and non tender MSK: ambulatory Skin: warm and dry Neuro:  + generalized weakness,  no cognitive impairment Psych: non-anxious affect, A and O x 3  CURRENT PROBLEM LIST:  Patient Active Problem List   Diagnosis Date Noted   Pancytopenia ([place]) 07/30/2021   Acute  on chronic systolic CHF (congestive heart failure) ([place]) 07/24/2021   Atrial fibrillation, chronic ([place]) 07/24/2021   H/O: stroke    Hematuria    Acute renal failure superimposed on stage 3a chronic kidney disease (HCC)    Thrombocytopenia (HCC)    HLD (hyperlipidemia)    Type II diabetes mellitus with renal manifestations (HCC)    Hypothyroid    IDA (iron deficiency anemia) 01/27/2021   Mixed hyperlipidemia 02/06/2020   Grief 07/04/2017   Dizziness    Encounter for central line placement    New onset atrial fibrillation ([place]) 06/27/2017   Diabetes ([place]) 06/27/2017   HTN (hypertension) 06/27/2017   CAD (coronary artery disease) 06/27/2017   PAST MEDICAL HISTORY:  Active Ambulatory Problems    Diagnosis Date Noted   New onset atrial fibrillation ([place]) 06/27/2017   Diabetes ([place]) 06/27/2017   HTN (hypertension) 06/27/2017   CAD (coronary artery disease) 06/27/2017   Dizziness    Encounter for central line placement    Grief 07/04/2017   IDA (iron deficiency anemia) 01/27/2021   Mixed hyperlipidemia 02/06/2020   Acute on chronic systolic CHF (congestive heart failure) ([place]) 07/24/2021   Atrial fibrillation, chronic ([place]) 07/24/2021   H/O: stroke    Hematuria    Acute renal failure superimposed on stage 3a chronic kidney disease ([place])    Thrombocytopenia ([place])    HLD (hyperlipidemia)    Type II diabetes mellitus with renal manifestations ([place])    Hypothyroid    Pancytopenia ([place]) 07/30/2021   Resolved Ambulatory Problems    Diagnosis Date Noted   SOB (shortness of breath)    Generalized weakness 09/14/2021   Melena 09/15/2021   Chest pain 09/15/2021   Hypoglycemia 09/15/2021   Past Medical History:  Diagnosis Date   AICD (automatic cardioverter/defibrillator) present    Aortic atherosclerosis (HCC)    Atrial fibrillation ([place])    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   CHF (congestive heart failure) (HCC)    CKD (chronic kidney disease), stage III (HCC)    Dyspnea    GERD  (gastroesophageal reflux disease)    HFrEF (heart failure with reduced ejection fraction) ([place])    Hypothyroidism    Ischemic cardiomyopathy    Left-sided cerebrovascular accident (CVA) ([place]) 2004   Long term current use of anticoagulant    S/P CABG x 4 2003   T2DM (type 2 diabetes mellitus) ([place])    SOCIAL HX:  Social History   Tobacco Use   Smoking status: Former    Types: Cigarettes   Smokeless tobacco: Former    Types: Chew  Substance Use Topics   Alcohol use: No   FAMILY HX:  Family History  Problem Relation Age of Onset   Diabetes Brother    Hypertension Mother    Diabetes Sister     reviewed  ALLERGIES:  Allergies  Allergen Reactions   Lorazepam Shortness Of Breath and Other (See Comments)    Pt experienced adverse reaction and was transferred to ICU last time they were given Med Other reaction(s): Other (See Comments) Pt experienced adverse reaction and was transferred to ICU last time they were given Med     PERTINENT MEDICATIONS:  Outpatient Encounter Medications as of 09/25/2021  Medication Sig   amiodarone (PACERONE) 200 MG tablet One tablet twice a day for one week  then one tablet daily (Patient taking differently: Take 200 mg by mouth daily. Half a tablet daily)   atorvastatin (LIPITOR) 80 MG tablet Take 80 mg by mouth daily.   ferrous sulfate (FERROUSUL) 325 (65 FE) MG tablet Take 1 tablet (325 mg total) by mouth daily with breakfast. Take 1 tablet twice a day every other day for 90 days.   folic acid (FOLVITE) 1 MG tablet Take 1 mg by mouth every morning.   levothyroxine (SYNTHROID) 88 MCG tablet Take 1 tablet (88 mcg total) by mouth once daily Take on an empty stomach with a glass of water at least 30-60 minutes before breakfast.   ONETOUCH ULTRA test strip daily.   pantoprazole (PROTONIX) 20 MG tablet Take 20 mg by mouth daily.   polyethylene glycol (MIRALAX / GLYCOLAX) packet Take 17 g by mouth daily as needed. (Patient not taking: Reported on  09/15/2021)   Vericiguat 5 MG TABS Take 5 mg by mouth 2 (two) times daily.   No facility-administered encounter medications on file as of 09/25/2021.   Thank you for the opportunity to participate in the care of Mr. Hinners.  The palliative care team will continue to follow. Please call our office at [phone] if we can be of additional assistance.   Questions and concerns were addressed. The patient/family was encouraged to call with questions and/or concerns. My business card was provided. Provided general support and encouragement, no other unmet needs identified   Dawid Dupriest Z Ivory Bail, NP ,   COVID-19 PATIENT SCREENING TOOL Asked and negative response unless otherwise noted:  Have you had symptoms of covid, tested positive or been in contact with someone with symptoms/positive test in the past 5-10 days? NO

## 2021-09-25 NOTE — ED Triage Notes (Signed)
Pt to ER via POV with brother with complaints of worsening shortness of breath and fluid retention. Reports recent d/c from the hospital where he gained approx 10lbs.   Pt was also taken off diuretics and has not taken torsemide for several days, has noticed new leg swelling.  Reports dry cough.

## 2021-09-26 ENCOUNTER — Encounter: Payer: Self-pay | Admitting: Hospitalist

## 2021-09-26 ENCOUNTER — Inpatient Hospital Stay
Admission: EM | Admit: 2021-09-26 | Discharge: 2021-09-29 | DRG: 291 | Disposition: A | Discharge status: Home Health | Payer: Medicare Other | Attending: Hospitalist | Admitting: Hospitalist

## 2021-09-26 DIAGNOSIS — Z95810 Presence of automatic (implantable) cardiac defibrillator: Secondary | ICD-10-CM

## 2021-09-26 DIAGNOSIS — Z8719 Personal history of other diseases of the digestive system: Secondary | ICD-10-CM

## 2021-09-26 DIAGNOSIS — I5043 Acute on chronic combined systolic (congestive) and diastolic (congestive) heart failure: Secondary | ICD-10-CM

## 2021-09-26 DIAGNOSIS — Z951 Presence of aortocoronary bypass graft: Secondary | ICD-10-CM

## 2021-09-26 DIAGNOSIS — E119 Type 2 diabetes mellitus without complications: Secondary | ICD-10-CM

## 2021-09-26 DIAGNOSIS — D696 Thrombocytopenia, unspecified: Secondary | ICD-10-CM | POA: Diagnosis present

## 2021-09-26 DIAGNOSIS — Z79899 Other long term (current) drug therapy: Secondary | ICD-10-CM | POA: Diagnosis not present

## 2021-09-26 DIAGNOSIS — K219 Gastro-esophageal reflux disease without esophagitis: Secondary | ICD-10-CM | POA: Diagnosis present

## 2021-09-26 DIAGNOSIS — Z8249 Family history of ischemic heart disease and other diseases of the circulatory system: Secondary | ICD-10-CM | POA: Diagnosis not present

## 2021-09-26 DIAGNOSIS — Z888 Allergy status to other drugs, medicaments and biological substances status: Secondary | ICD-10-CM | POA: Diagnosis not present

## 2021-09-26 DIAGNOSIS — E039 Hypothyroidism, unspecified: Secondary | ICD-10-CM | POA: Diagnosis present

## 2021-09-26 DIAGNOSIS — I2510 Atherosclerotic heart disease of native coronary artery without angina pectoris: Secondary | ICD-10-CM | POA: Diagnosis present

## 2021-09-26 DIAGNOSIS — N1831 Chronic kidney disease, stage 3a: Secondary | ICD-10-CM | POA: Diagnosis present

## 2021-09-26 DIAGNOSIS — I255 Ischemic cardiomyopathy: Secondary | ICD-10-CM

## 2021-09-26 DIAGNOSIS — E1122 Type 2 diabetes mellitus with diabetic chronic kidney disease: Secondary | ICD-10-CM | POA: Diagnosis present

## 2021-09-26 DIAGNOSIS — I480 Paroxysmal atrial fibrillation: Secondary | ICD-10-CM

## 2021-09-26 DIAGNOSIS — Z79890 Hormone replacement therapy (postmenopausal): Secondary | ICD-10-CM | POA: Diagnosis not present

## 2021-09-26 DIAGNOSIS — E785 Hyperlipidemia, unspecified: Secondary | ICD-10-CM | POA: Diagnosis present

## 2021-09-26 DIAGNOSIS — Z8673 Personal history of transient ischemic attack (TIA), and cerebral infarction without residual deficits: Secondary | ICD-10-CM | POA: Diagnosis not present

## 2021-09-26 DIAGNOSIS — I700 Atherosclerosis of aorta: Secondary | ICD-10-CM | POA: Diagnosis present

## 2021-09-26 DIAGNOSIS — Z833 Family history of diabetes mellitus: Secondary | ICD-10-CM | POA: Diagnosis not present

## 2021-09-26 DIAGNOSIS — I509 Heart failure, unspecified: Secondary | ICD-10-CM

## 2021-09-26 DIAGNOSIS — I130 Hypertensive heart and chronic kidney disease with heart failure and stage 1 through stage 4 chronic kidney disease, or unspecified chronic kidney disease: Secondary | ICD-10-CM | POA: Diagnosis present

## 2021-09-26 DIAGNOSIS — Z20822 Contact with and (suspected) exposure to covid-19: Secondary | ICD-10-CM | POA: Diagnosis present

## 2021-09-26 DIAGNOSIS — R0602 Shortness of breath: Secondary | ICD-10-CM | POA: Diagnosis present

## 2021-09-26 DIAGNOSIS — Z87891 Personal history of nicotine dependence: Secondary | ICD-10-CM | POA: Diagnosis not present

## 2021-09-26 LAB — CBC
HCT: 30.4 % — ABNORMAL LOW (ref 39.0–52.0)
Hemoglobin: 9.6 g/dL — ABNORMAL LOW (ref 13.0–17.0)
MCH: 28.1 pg (ref 26.0–34.0)
MCHC: 31.6 g/dL (ref 30.0–36.0)
MCV: 88.9 fL (ref 80.0–100.0)
Platelets: 103 10*3/uL — ABNORMAL LOW (ref 150–400)
RBC: 3.42 MIL/uL — ABNORMAL LOW (ref 4.22–5.81)
RDW: 17.3 % — ABNORMAL HIGH (ref 11.5–15.5)
WBC: 3.7 10*3/uL — ABNORMAL LOW (ref 4.0–10.5)
nRBC: 0 % (ref 0.0–0.2)

## 2021-09-26 LAB — GLUCOSE, CAPILLARY
Glucose-Capillary: 99 mg/dL (ref 70–99)
Glucose-Capillary: 206 mg/dL — ABNORMAL HIGH (ref 70–99)

## 2021-09-26 LAB — RESP PANEL BY RT-PCR (FLU A&B, COVID) ARPGX2
Influenza A by PCR: NEGATIVE
Influenza B by PCR: NEGATIVE
SARS Coronavirus 2 by RT PCR: NEGATIVE

## 2021-09-26 LAB — CREATININE, SERUM
Creatinine, Ser: 1.28 mg/dL — ABNORMAL HIGH (ref 0.61–1.24)
GFR, Estimated: 58 mL/min — ABNORMAL LOW (ref >60)

## 2021-09-26 LAB — CBG MONITORING, ED
Glucose-Capillary: 111 mg/dL — ABNORMAL HIGH (ref 70–99)
Glucose-Capillary: 127 mg/dL — ABNORMAL HIGH (ref 70–99)

## 2021-09-26 MED ORDER — ENOXAPARIN SODIUM 40 MG/0.4ML IJ SOSY
40.0000 mg | PREFILLED_SYRINGE | Freq: Every day | INTRAMUSCULAR | Status: DC
  Administered 2021-09-26 – 2021-09-29 (×4): 40 mg via SUBCUTANEOUS
  Filled 2021-09-26 (×4): qty 0.4

## 2021-09-26 MED ORDER — SODIUM CHLORIDE 0.9% FLUSH: 3.0000 mL | INTRAVENOUS | Status: DC | PRN

## 2021-09-26 MED ORDER — AMIODARONE HCL 200 MG PO TABS
100.0000 mg | ORAL_TABLET | Freq: Every day | ORAL | Status: DC
  Administered 2021-09-26 – 2021-09-29 (×4): 100 mg via ORAL
  Filled 2021-09-26 (×4): qty 1

## 2021-09-26 MED ORDER — LEVOTHYROXINE SODIUM 88 MCG PO TABS
88.0000 ug | ORAL_TABLET | Freq: Every day | ORAL | Status: DC
  Administered 2021-09-27 – 2021-09-29 (×3): 88 ug via ORAL
  Filled 2021-09-26 (×3): qty 1

## 2021-09-26 MED ORDER — SODIUM CHLORIDE 0.9% FLUSH
3.0000 mL | Freq: Two times a day (BID) | INTRAVENOUS | Status: DC
  Administered 2021-09-26 – 2021-09-29 (×4): 3 mL via INTRAVENOUS

## 2021-09-26 MED ORDER — INSULIN ASPART 100 UNIT/ML IJ SOLN
0.0000 [IU] | Freq: Three times a day (TID) | INTRAMUSCULAR | Status: DC
  Administered 2021-09-26: 1 [IU] via SUBCUTANEOUS
  Administered 2021-09-27 (×2): 2 [IU] via SUBCUTANEOUS
  Administered 2021-09-28: 3 [IU] via SUBCUTANEOUS
  Filled 2021-09-26 (×3): qty 1

## 2021-09-26 MED ORDER — FUROSEMIDE 10 MG/ML IJ SOLN
20.0000 mg | Freq: Two times a day (BID) | INTRAMUSCULAR | Status: DC
  Administered 2021-09-26: 20 mg via INTRAVENOUS
  Filled 2021-09-26: qty 4

## 2021-09-26 MED ORDER — PANTOPRAZOLE SODIUM 20 MG PO TBEC: 20.0000 mg | DELAYED_RELEASE_TABLET | Freq: Every day | ORAL | Status: DC

## 2021-09-26 MED ORDER — ONDANSETRON HCL 4 MG/2ML IJ SOLN: 4.0000 mg | Freq: Four times a day (QID) | INTRAMUSCULAR | Status: DC | PRN

## 2021-09-26 MED ORDER — ACETAMINOPHEN 325 MG PO TABS: 650.0000 mg | ORAL_TABLET | ORAL | Status: DC | PRN

## 2021-09-26 MED ORDER — FUROSEMIDE 10 MG/ML IJ SOLN
4.0000 mg/h | INTRAVENOUS | Status: DC
  Administered 2021-09-26 – 2021-09-28 (×3): 4 mg/h via INTRAVENOUS
  Filled 2021-09-26 (×2): qty 20

## 2021-09-26 MED ORDER — VERICIGUAT 5 MG PO TABS
5.0000 mg | ORAL_TABLET | Freq: Two times a day (BID) | ORAL | Status: DC
  Administered 2021-09-26 – 2021-09-29 (×5): 5 mg via ORAL
  Filled 2021-09-26 (×12): qty 1

## 2021-09-26 MED ORDER — SODIUM CHLORIDE 0.9 % IV SOLN: 250.0000 mL | INTRAVENOUS | Status: DC | PRN

## 2021-09-26 MED ORDER — FUROSEMIDE 10 MG/ML IJ SOLN
40.0000 mg | Freq: Once | INTRAMUSCULAR | Status: AC
  Administered 2021-09-26: 40 mg via INTRAVENOUS
  Filled 2021-09-26: qty 4

## 2021-09-26 MED ORDER — ATORVASTATIN CALCIUM 80 MG PO TABS
80.0000 mg | ORAL_TABLET | Freq: Every evening | ORAL | Status: DC
  Administered 2021-09-26 – 2021-09-28 (×3): 80 mg via ORAL
  Filled 2021-09-26 (×3): qty 1

## 2021-09-26 MED ORDER — PANTOPRAZOLE SODIUM 40 MG PO TBEC
40.0000 mg | DELAYED_RELEASE_TABLET | Freq: Every day | ORAL | Status: DC
  Administered 2021-09-26 – 2021-09-29 (×4): 40 mg via ORAL
  Filled 2021-09-26 (×4): qty 1

## 2021-09-26 MED ORDER — INSULIN ASPART 100 UNIT/ML IJ SOLN
0.0000 [IU] | Freq: Every day | INTRAMUSCULAR | Status: DC
  Administered 2021-09-26: 2 [IU] via SUBCUTANEOUS
  Administered 2021-09-28: 3 [IU] via SUBCUTANEOUS
  Filled 2021-09-26 (×2): qty 1

## 2021-09-26 NOTE — H&P (Signed)
History and Physical    Walter Blair QMV:[id number] DOB: [birth year] DOA: 09/26/2021  PCP: Jodi Marble, MD   Patient coming from: home  I have personally briefly reviewed patient's relevant medical records in [place]  Chief Complaint: shortness of breath  HPI: Walter Blair is a 76 y.o. male with medical history significant for CAD s/p CABG, ischemic cardiomyopathy/combined CHF (EF<20%, G2 DD 09/15/2021), paroxysmal atrial fibrillation, CKD stage IIIa, GERD, CAD s/p CABG, essential hypertension, hypothyroidism, iron deficiency anemia, type 2 diabetes mellitus, history of CVA , hospitalized from 12/26-12/31 with acute blood loss anemia requiring 3 units PRBC and AKI who returns to the ED with a complaint of shortness of breath, lower extremity edema.  At discharge patient's Entresto and spironolactone were discontinued due to hypotension, hyperkalemia and AKI.  Xarelto also DC'd due to GI bleed. Patient denies chest pain.  Denies cough, fever or chills.  Has no nausea, vomiting, abdominal pain or diarrhea  ED course: Mild tachypnea of 22 with borderline soft BP of 108/64 with otherwise normal vitals Blood work: Troponin 18-18, BNP 4358, up from 2575 on 12/26 Creatinine 1.40, improved from 2.41 on 12/26 Hemoglobin 9.8, uptrending from 12/30  EKG, personally viewed and interpreted: Ventricular paced rhythm at 61  Chest x-ray: Cardiomegaly with suspect mild pulmonary edema and small pleural effusions  Patient treated with a dose of IV Lasix.  Hospitalist consulted for admission  Review of Systems: As per HPI otherwise all other systems on review of systems negative.   Assessment/Plan    Acute on chronic combined systolic and diastolic CHF Ischemic cardiomyopathy s/p AICD -EF<20%, G2 DD 09/15/2021 - IV Lasix as blood pressure will tolerate - Recent discontinuation of Entresto and spironolactone due to AKI, hypotension - Daily weights with intake and output  monitoring - Cardiology consult for assistance with med management and additional recommendations    PAF (paroxysmal atrial fibrillation) (Walter Blair) - Currently in a paced rhythm at 61 - Continue amiodarone - Xarelto DC'd 2 weeks prior due to the GI bleed - Cardiology consult for recommendations on restarting Xarelto    Diabetes mellitus without complication (Walter Blair) - Recent discontinuation of sulfonylurea due to hypoglycemia - Sliding scale insulin coverage    CAD s/p CABG x4 - No complaints of chest pain, EKG nonacute - Continue atorvastatin    H/O: stroke - Continue atorvastatin    Thrombocytopenia (HCC) - Stable    History of GI bleed, 08/2021 - Currently off Xarelto - Hemoglobin stable - Continue Protonix - EGD was unremarkable   DVT prophylaxis: Lovenox  Code Status: full code  Family Communication:  none  Disposition Plan: Back to previous home environment Consults called: Cardiology Status:At the time of admission, it appears that the appropriate admission status for this patient is INPATIENT. This is judged to be reasonable and necessary in order to provide the required intensity of service to ensure the patient's safety given the presenting symptoms, physical exam findings, and initial radiographic and laboratory data in the context of their  Comorbid conditions.   Patient requires inpatient status due to high intensity of service, high risk for further deterioration and high frequency of surveillance required.   I certify that at the point of admission it is my clinical judgment that the patient will require inpatient hospital care spanning beyond 2 midnights     Physical Exam: Vitals:   09/25/21 1304 09/25/21 1630 09/25/21 2003 09/26/21 0316  BP: 108/64 110/72 119/72 120/69  Pulse: 65 65 66 65  Resp: (!) 22 20 12 16   Temp: 97.9 F (36.6 C)  97.7 F (36.5 C)   TempSrc: Oral  Oral   SpO2: 100% 100% 100% 100%  Weight:      Height:       Constitutional: Alert,  oriented x 3 . Not in any apparent distress HEENT:      Head: Normocephalic and atraumatic.         Eyes: PERLA, EOMI, Conjunctivae are normal. Sclera is non-icteric.       Mouth/Throat: Mucous membranes are moist.       Neck: Supple with no signs of meningismus. Cardiovascular: Regular rate and rhythm. No murmurs, gallops, or rubs. 2+ symmetrical distal pulses are present . No JVD. 2+ LE edema Respiratory: Respiratory effort normal .Lungs sounds clear bilaterally.  Bibasilar rales Gastrointestinal: Soft, non tender, non distended. Positive bowel sounds.  Genitourinary: No CVA tenderness. Musculoskeletal: Nontender with normal range of motion in all extremities. No cyanosis, or erythema of extremities. Neurologic:  Face is symmetric. Moving all extremities. No gross focal neurologic deficits . Skin: Skin is warm, dry.  No rash or ulcers Psychiatric: Mood and affect are appropriate     Past Medical History:  Diagnosis Date   AICD (automatic cardioverter/defibrillator) present    Aortic atherosclerosis ([place])    Atrial fibrillation ([place])    a.) CHA2DS2-VASc = 7 (age x 2, CHF, CVA x 2, aortic plaque, T2DM). b.) rate/rhythm maintained on oral amiodarone; chronically anticoagulated on rivaroxaban. 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 b.) TTE 07/25/2021: EF 20-25%; global HK; G3DD; severely reduced RV function; Severe BAR; mod-severe MR, severe TR, mild AR.   HLD (hyperlipidemia)    HTN (hypertension)    Hypothyroidism    IDA (iron deficiency anemia)    Ischemic  cardiomyopathy    a.) TTE 06/29/2017: EF 10-15%. b.) TTE 07/25/2021: ED 20-25%.   Left-sided cerebrovascular accident (CVA) ([place]) 2004   a.) residual weakness in LEFT hand   Long term current use of anticoagulant    a.) rivaroxaban   S/P CABG x 4 2003   Performed in [place]   T2DM (type 2 diabetes mellitus) ([place])    Thrombocytopenia ([place])    possibly associated with underlying DIC    Past Surgical History:  Procedure Laterality Date   BLADDER INSTILLATION N/A 08/07/2021   Procedure: BLADDER INSTILLATION OF GEMCITABINE;  Surgeon: Billey Co, MD;  Location: [facility] ORS;  Service: Urology;  Laterality: N/A;   CARDIAC DEFIBRILLATOR PLACEMENT     CARDIOVERSION N/A 07/01/2017   Procedure: CARDIOVERSION;  Surgeon: Dionisio David, MD;  Location: [facility] ORS;  Service: Cardiovascular;  Laterality: N/A;   CARDIOVERSION N/A 07/05/2017   Procedure: CARDIOVERSION;  Surgeon: Dionisio David, MD;  Location: [facility] ORS;  Service: Cardiovascular;  Laterality: N/A;   COLONOSCOPY WITH PROPOFOL N/A 12/30/2020   Procedure: COLONOSCOPY WITH PROPOFOL;  Surgeon: [name], MD;  Location: [facility] ENDOSCOPY;  Service: Gastroenterology;  Laterality: N/A;   CORONARY ARTERY BYPASS GRAFT N/A 2003  Procedure: Four-vessel CABG performed in [place] Bilateral 08/07/2021   Procedure: CYSTOSCOPY WITH RETROGRADE PYELOGRAM;  Surgeon: Billey Co, MD;  Location: [facility] ORS;  Service: Urology;  Laterality: Bilateral;   ESOPHAGOGASTRODUODENOSCOPY  12/30/2020   Procedure: ESOPHAGOGASTRODUODENOSCOPY (EGD);  Surgeon: [name], MD;  Location[facility]COPY;  Service: Gastroenterology;;   ESOPHAGOGASTRODUODENOSCOPY N/A 09/17/2021   Procedure: ESOPHAGOGASTRODUODENOSCOPY (EGD);  Surgeon: Virgel Manifold, MD;  Location: [facility] ENDOSCOPY;  Service: Endoscopy;  Laterality: N/A;   GIVENS CAPSULE STUDY N/A 02/02/2021   Procedure: GIVENS CAPSULE STUDY;  Surgeon: [name], MD;   Location: [facility] ENDOSCOPY;  Service: Gastroenterology;  Laterality: N/A;  WILL NEED INTERPRETER ON WHEELS;  BROTHER WANTS TO TRANSLATE   TEE WITHOUT CARDIOVERSION N/A 07/01/2017   Procedure: TRANSESOPHAGEAL ECHOCARDIOGRAM (TEE);  Surgeon: Dionisio David, MD;  Location: [facility] ORS;  Service: Cardiovascular;  Laterality: N/A;   TEE WITHOUT CARDIOVERSION N/A 07/05/2017   Procedure: TRANSESOPHAGEAL ECHOCARDIOGRAM (TEE);  Surgeon: Dionisio David, MD;  Location: [facility] ORS;  Service: Cardiovascular;  Laterality: N/A;   TRANSURETHRAL RESECTION OF BLADDER TUMOR N/A 08/07/2021   Procedure: TRANSURETHRAL RESECTION OF BLADDER TUMOR (TURBT);  Surgeon: Billey Co, MD;  Location: [facility] ORS;  Service: Urology;  Laterality: N/A;   URETEROSCOPY Left 08/07/2021   Procedure: DIAGNOSTIC URETEROSCOPY;  Surgeon: Billey Co, MD;  Location: [facility] ORS;  Service: Urology;  Laterality: Left;     reports that he has quit smoking. His smoking use included cigarettes. He has quit using smokeless tobacco.  His smokeless tobacco use included chew. He reports that he does not drink alcohol and does not use drugs.  Allergies  Allergen Reactions   Lorazepam Shortness Of Breath and Other (See Comments)    Pt experienced adverse reaction and was transferred to ICU last time they were given Med Other reaction(s): Other (See Comments) Pt experienced adverse reaction and was transferred to ICU last time they were given Med    Family History  Problem Relation Age of Onset   Diabetes Brother    Hypertension Mother    Diabetes Sister       Prior to Admission medications   Medication Sig Start Date End Date Taking? Authorizing Provider  amiodarone (PACERONE) 200 MG tablet One tablet twice a day for one week then one tablet daily Patient taking differently: Take 200 mg by mouth daily. Half a tablet daily 07/06/17   Loletha Grayer, MD  atorvastatin (LIPITOR) 80 MG tablet Take 80 mg by mouth daily. 10/03/20   [provider]  ferrous sulfate (FERROUSUL) 325 (65 FE) MG tablet Take 1 tablet (325 mg total) by mouth daily with breakfast. Take 1 tablet twice a day every other day for 90 days. 09/19/21 12/18/21  [place] Donnamarie Poag, DO  folic acid (FOLVITE) 1 MG tablet Take 1 mg by mouth every morning.    [provider]  levothyroxine (SYNTHROID) 88 MCG tablet Take 1 tablet (88 mcg total) by mouth once daily Take on an empty stomach with a glass of water at least 30-60 minutes before breakfast. 09/01/21 09/01/22  [provider]  [facility] ULTRA test strip daily. 04/21/21   [provider]  pantoprazole (PROTONIX) 20 MG tablet Take 20 mg by mouth daily.    [provider]  polyethylene glycol (MIRALAX / GLYCOLAX) packet Take 17 g by mouth daily as needed. Patient not taking: Reported on 09/15/2021 07/06/17   Loletha Grayer, MD  Vericiguat 5 MG TABS Take 5  mg by mouth 2 (two) times daily.    [provider]      Labs on Admission: I have personally reviewed following labs and imaging studies  CBC: Recent Labs  Lab 09/19/21 0520 09/25/21 1351  WBC 4.6 4.1  HGB 8.9* 9.8*  HCT 27.8* 32.1*  MCV 88.3 90.7  PLT 109* 191*   Basic Metabolic Panel: Recent Labs  Lab 09/19/21 0520 09/25/21 1351  NA 133* 132*  K 3.9 4.4  CL 109 105  CO2 18* 21*  GLUCOSE 110* 124*  BUN 22 21  CREATININE 1.45* 1.40*  CALCIUM 8.2* 8.6*   GFR: Estimated Creatinine Clearance: 37.6 mL/min (A) (by C-G formula based on SCr of 1.4 mg/dL (H)). Liver Function Tests: No results for input(s): AST, ALT, ALKPHOS, BILITOT, PROT, ALBUMIN in the last 168 hours. No results for input(s): LIPASE, AMYLASE in the last 168 hours. No results for input(s): AMMONIA in the last 168 hours. Coagulation Profile: No results for input(s): INR, PROTIME in the last 168 hours. Cardiac Enzymes: No results for input(s): CKTOTAL, CKMB, CKMBINDEX, TROPONINI in the last 168 hours. BNP (last 3 results) No  results for input(s): PROBNP in the last 8760 hours. HbA1C: No results for input(s): HGBA1C in the last 72 hours. CBG: Recent Labs  Lab 09/19/21 0542 09/19/21 0815  GLUCAP 119* 127*   Lipid Profile: No results for input(s): CHOL, HDL, LDLCALC, TRIG, CHOLHDL, LDLDIRECT in the last 72 hours. Thyroid Function Tests: No results for input(s): TSH, T4TOTAL, FREET4, T3FREE, THYROIDAB in the last 72 hours. Anemia Panel: No results for input(s): VITAMINB12, FOLATE, FERRITIN, TIBC, IRON, RETICCTPCT in the last 72 hours. Urine analysis:    Component Value Date/Time   COLORURINE YELLOW 09/14/2021 0727   APPEARANCEUR CLEAR 09/14/2021 0727   APPEARANCEUR Cloudy (A) 08/05/2021 1307   LABSPEC 1.015 09/14/2021 0727   PHURINE 5.0 09/14/2021 0727   GLUCOSEU NEGATIVE 09/14/2021 0727   HGBUR NEGATIVE 09/14/2021 0727   BILIRUBINUR NEGATIVE 09/14/2021 0727   BILIRUBINUR Negative 08/05/2021 1307   KETONESUR NEGATIVE 09/14/2021 0727   PROTEINUR 30 (A) 09/14/2021 0727   NITRITE NEGATIVE 09/14/2021 0727   LEUKOCYTESUR SMALL (A) 09/14/2021 0727    Radiological Exams on Admission: DG Chest 2 View  Result Date: 09/25/2021 CLINICAL DATA:  Shortness of breath. EXAM: CHEST - 2 VIEW COMPARISON:  09/14/2021 FINDINGS: Sequelae of CABG are again identified. The cardiac silhouette remains mildly enlarged. An ICD remains in place. Thoracic aortic atherosclerosis is noted. There is mild pulmonary vascular congestion with increased accentuation of the interstitial markings compared to the prior study. A small left pleural effusion is new or larger, a and there is also a new small right pleural effusion. No pneumothorax is identified. IMPRESSION: Cardiomegaly with suspected mild pulmonary edema and small pleural effusions. Electronically Signed   By: Logan Bores M.D.   On: 09/25/2021 13:39       Athena Masse MD [facility]   09/26/2021, 3:47 AM

## 2021-09-26 NOTE — ED Provider Notes (Signed)
[facility] Provider Note    Event Date/Time   First MD Initiated Contact with Patient 09/26/21 [phone]     (approximate)   History   Shortness of Breath   HPI  Walter Blair is a 76 y.o. male history of CHF status post pacemaker/defibrillator, atrial fibrillation, chronic kidney disease, hypertension, hyperlipidemia, diabetes, CAD status post CABG, CVA who presents to the emergency department with family for concerns for increasing shortness of breath and leg swelling over the past few days.  He denies any chest pain.  No fevers or cough.  Was just admitted to the hospital 09/14/2021 -09/19/2021 for anemia with concerns for an upper GI bleed.  Xarelto was stopped at that time.  Also found to have acute on chronic kidney disease, hyperkalemia and hypotension.  His Entresto, spironolactone and torsemide were discontinued.  Also was hypoglycemic and had his glyburide and glipizide discontinued.  Does not wear oxygen at home.   History provided by patient and son.      Past Medical History:  Diagnosis Date   AICD (automatic cardioverter/defibrillator) present    Aortic atherosclerosis ([place])    Atrial fibrillation (HCC)    a.) CHA2DS2-VASc = 7 (age x 2, CHF, CVA x 2, aortic plaque, T2DM). b.) rate/rhythm maintained on oral amiodarone; chronically anticoagulated on rivaroxaban. 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 b.) TTE 07/25/2021: EF 20-25%; global HK; G3DD; severely reduced RV function; Severe BAR; mod-severe MR, severe TR, mild AR.    HLD (hyperlipidemia)    HTN (hypertension)    Hypothyroidism    IDA (iron deficiency anemia)    Ischemic cardiomyopathy    a.) TTE 06/29/2017: EF 10-15%. b.) TTE 07/25/2021: ED 20-25%.   Left-sided cerebrovascular accident (CVA) ([place]) 2004   a.) residual weakness in LEFT hand   Long term current use of anticoagulant    a.) rivaroxaban   S/P CABG x 4 2003   Performed in [place]   T2DM (type 2 diabetes mellitus) ([place])    Thrombocytopenia ([place])    possibly associated with underlying DIC    Past Surgical History:  Procedure Laterality Date   BLADDER INSTILLATION N/A 08/07/2021   Procedure: BLADDER INSTILLATION OF GEMCITABINE;  Surgeon: Billey Co, MD;  Location: [facility] ORS;  Service: Urology;  Laterality: N/A;   CARDIAC DEFIBRILLATOR PLACEMENT     CARDIOVERSION N/A 07/01/2017   Procedure: CARDIOVERSION;  Surgeon: Dionisio David, MD;  Location: [facility] ORS;  Service: Cardiovascular;  Laterality: N/A;   CARDIOVERSION N/A 07/05/2017   Procedure: CARDIOVERSION;  Surgeon: Dionisio David, MD;  Location: [facility] ORS;  Service: Cardiovascular;  Laterality: N/A;   COLONOSCOPY WITH PROPOFOL N/A 12/30/2020   Procedure: COLONOSCOPY WITH PROPOFOL;  Surgeon: [name], MD;  Location: [facility] ENDOSCOPY;  Service: Gastroenterology;  Laterality: N/A;   CORONARY ARTERY BYPASS GRAFT N/A 2003   Procedure: Four-vessel CABG performed in [place] Bilateral 08/07/2021   Procedure: CYSTOSCOPY WITH RETROGRADE PYELOGRAM;  Surgeon: Billey Co, MD;  Location: [facility] ORS;  Service: Urology;  Laterality: Bilateral;   ESOPHAGOGASTRODUODENOSCOPY  12/30/2020   Procedure: ESOPHAGOGASTRODUODENOSCOPY (EGD);  Surgeon: [name], MD;  Location[facility] ENDOSCOPY;  Service: Gastroenterology;;   ESOPHAGOGASTRODUODENOSCOPY N/A 09/17/2021   Procedure: ESOPHAGOGASTRODUODENOSCOPY (EGD);  Surgeon: Virgel Manifold, MD;  Location: [facility] ENDOSCOPY;  Service: Endoscopy;  Laterality: N/A;    GIVENS CAPSULE STUDY N/A 02/02/2021   Procedure: GIVENS CAPSULE STUDY;  Surgeon: [name], MD;  Location: [facility] ENDOSCOPY;  Service: Gastroenterology;  Laterality: N/A;  WILL NEED INTERPRETER ON WHEELS;  BROTHER WANTS TO TRANSLATE   TEE WITHOUT CARDIOVERSION N/A 07/01/2017   Procedure: TRANSESOPHAGEAL ECHOCARDIOGRAM (TEE);  Surgeon: Dionisio David, MD;  Location: [facility] ORS;  Service: Cardiovascular;  Laterality: N/A;   TEE WITHOUT CARDIOVERSION N/A 07/05/2017   Procedure: TRANSESOPHAGEAL ECHOCARDIOGRAM (TEE);  Surgeon: Dionisio David, MD;  Location: [facility] ORS;  Service: Cardiovascular;  Laterality: N/A;   TRANSURETHRAL RESECTION OF BLADDER TUMOR N/A 08/07/2021   Procedure: TRANSURETHRAL RESECTION OF BLADDER TUMOR (TURBT);  Surgeon: Billey Co, MD;  Location: [facility] ORS;  Service: Urology;  Laterality: N/A;   URETEROSCOPY Left 08/07/2021   Procedure: DIAGNOSTIC URETEROSCOPY;  Surgeon: Billey Co, MD;  Location: [facility] ORS;  Service: Urology;  Laterality: Left;    MEDICATIONS:  Prior to Admission medications   Medication Sig Start Date End Date Taking? Authorizing Provider  amiodarone (PACERONE) 200 MG tablet One tablet twice a day for one week then one tablet daily Patient taking differently: Take 200 mg by mouth daily. Half a tablet daily 07/06/17   Loletha Grayer, MD  atorvastatin (LIPITOR) 80 MG tablet Take 80 mg by mouth daily. 10/03/20   [provider]  ferrous sulfate (FERROUSUL) 325 (65 FE) MG tablet Take 1 tablet (325 mg total) by mouth daily with breakfast. Take 1 tablet twice a day every other day for 90 days. 09/19/21 12/18/21  [place] Donnamarie Poag, DO  folic acid (FOLVITE) 1 MG tablet Take 1 mg by mouth every morning.    [provider]  levothyroxine (SYNTHROID) 88 MCG tablet Take 1 tablet (88 mcg total) by mouth once daily Take on an empty stomach with a glass of water at least 30-60 minutes before breakfast. 09/01/21 09/01/22  [provider]   [facility] ULTRA test strip daily. 04/21/21   [provider]  pantoprazole (PROTONIX) 20 MG tablet Take 20 mg by mouth daily.    [provider]  polyethylene glycol (MIRALAX / GLYCOLAX) packet Take 17 g by mouth daily as needed. Patient not taking: Reported on 09/15/2021 07/06/17   Loletha Grayer, MD  Vericiguat 5 MG TABS Take 5 mg by mouth 2 (two) times daily.    [provider]    Physical Exam   Triage Vital Signs: ED Triage Vitals  Enc Vitals Group     BP 09/25/21 1304 108/64     Pulse Rate 09/25/21 1304 65     Resp 09/25/21 1304 (!) 22     Temp 09/25/21 1304 97.9 F (36.6 C)     Temp Source 09/25/21 1304 Oral     SpO2 09/25/21 1304 100 %     Weight 09/25/21 1300 136 lb (61.7 kg)     Height 09/25/21 1300 5\' 4"  (1.626 m)     Head Circumference --      Peak Flow --      Pain Score 09/25/21 1300 0     Pain Loc --      Pain Edu? --  Excl. in [place]? --     Most recent vital signs: Vitals:   09/25/21 2003 09/26/21 0316  BP: 119/72 120/69  Pulse: 66 65  Resp: 12 16  Temp: 97.7 F (36.5 C)   SpO2: 100% 100%    CONSTITUTIONAL: Alert and oriented and responds appropriately to questions.  Elderly, no distress HEAD: Normocephalic, atraumatic EYES: Conjunctivae clear, pupils appear equal, sclera nonicteric ENT: normal nose; moist mucous membranes NECK: Supple, normal ROM CARD: RRR; S1 and S2 appreciated; no murmurs, no clicks, no rubs, no gallops RESP: Normal chest excursion without splinting or tachypnea; breath sounds equal bilaterally.  No wheezing or rhonchi.  Patient has bibasilar rales.  No respiratory distress or hypoxia.  Speaking full sentences. ABD/GI: Normal bowel sounds; non-distended; soft, non-tender, no rebound, no guarding, no peritoneal signs BACK: The back appears normal EXT: Normal ROM in all joints; no deformity noted, 2+ pitting edema in bilateral lower extremities from about the mid shin down, no calf tenderness or calf  swelling SKIN: Normal color for age and race; warm; no rash on exposed skin NEURO: Moves all extremities equally, normal speech PSYCH: The patient's mood and manner are appropriate.   ED Results / Procedures / Treatments   LABS: (all labs ordered are listed, but only abnormal results are displayed) Labs Reviewed  BASIC METABOLIC PANEL - Abnormal; Notable for the following components:      Result Value   Sodium 132 (*)    CO2 21 (*)    Glucose, Bld 124 (*)    Creatinine, Ser 1.40 (*)    Calcium 8.6 (*)    GFR, Estimated 52 (*)    All other components within normal limits  CBC - Abnormal; Notable for the following components:   RBC 3.54 (*)    Hemoglobin 9.8 (*)    HCT 32.1 (*)    RDW 17.4 (*)    Platelets 127 (*)    All other components within normal limits  BRAIN NATRIURETIC PEPTIDE - Abnormal; Notable for the following components:   B Natriuretic Peptide 4,358.6 (*)    All other components within normal limits  TROPONIN I (HIGH SENSITIVITY) - Abnormal; Notable for the following components:   Troponin I (High Sensitivity) 18 (*)    All other components within normal limits  TROPONIN I (HIGH SENSITIVITY) - Abnormal; Notable for the following components:   Troponin I (High Sensitivity) 18 (*)    All other components within normal limits  RESP PANEL BY RT-PCR (FLU A&B, COVID) [id number]     EKG:  EKG Interpretation  Date/Time:  Friday September 25 2021 13:08:14 EST Ventricular Rate:  61 PR Interval:    QRS Duration: 166 QT Interval:  504 QTC Calculation: 507 R Axis:   136 Text Interpretation: Ventricular-paced rhythm Abnormal ECG When compared with ECG of 14-Sep-2021 20:35, No significant change was found Confirmed by UNCONFIRMED, DOCTOR ([id number]), editor Mel Almond, Tammy [phone]) on 09/25/2021 1:34:11 PM         RADIOLOGY: My personal review and interpretation of imaging: Chest x-ray shows pulmonary edema.  I have personally reviewed all radiology reports.   DG Chest 2  View  Result Date: 09/25/2021 CLINICAL DATA:  Shortness of breath. EXAM: CHEST - 2 VIEW COMPARISON:  09/14/2021 FINDINGS: Sequelae of CABG are again identified. The cardiac silhouette remains mildly enlarged. An ICD remains in place. Thoracic aortic atherosclerosis is noted. There is mild pulmonary vascular congestion with increased accentuation of the interstitial markings compared to the prior study. A small  left pleural effusion is new or larger, a and there is also a new small right pleural effusion. No pneumothorax is identified. IMPRESSION: Cardiomegaly with suspected mild pulmonary edema and small pleural effusions. Electronically Signed   By: Logan Bores M.D.   On: 09/25/2021 13:39     PROCEDURES:  Critical Care performed: No     .1-3 Lead EKG Interpretation Performed by: Evelynne Spiers, Delice Bison, DO Authorized by: Pacen Watford, Delice Bison, DO     Interpretation: normal     ECG rate:  62   ECG rate assessment: normal     Rhythm: sinus rhythm     Ectopy: none     Conduction: normal      IMPRESSION / MDM / ASSESSMENT AND PLAN / ED COURSE  I reviewed the triage vital signs and the nursing notes.    Patient here with shortness of breath, lower extremity swelling.  History of CHF and was taken off of his torsemide, Entresto and spironolactone during recent hospitalization due to acute kidney injury superimposed on chronic kidney disease, hyperkalemia and hypotension.  The patient is on the cardiac monitor to evaluate for evidence of arrhythmia and/or significant heart rate changes.   DIFFERENTIAL DIAGNOSIS (includes but not limited to):   CHF, pneumonia, pneumothorax, COVID, influenza, PE, ACS   PLAN: Cardiac labs, chest x-ray, EKG   MEDICATIONS GIVEN IN ED: Medications  furosemide (LASIX) injection 40 mg (40 mg Intravenous Given 09/26/21 0323)     ED COURSE: Patient is EKG shows no new ischemic change.  Troponin x2 is flat and at his baseline.  Renal function continues to be improving  from recent admission and is down to 1.4.  He was also previously anemic and received a blood transfusion and hemoglobin has improved and is now 9.8.  BNP is greater than 4300.  Chest x-ray reviewed by myself and radiology and shows cardiomegaly and mild pulmonary edema.  He has not been hypoxic here.  Offered diuresis and observation in the ED versus admission.  Patient and family would feel more comfortable with admission to the hospital.  Given IV Lasix here for diuresis.   CONSULTS:  Consulted and discussed patient's case with hospitalist, Dr. Damita Dunnings.  I have recommended admission and consulting physician agrees and will place admission orders.  Patient (and family if present) agree with this plan.   I reviewed all nursing notes, vitals, pertinent previous records.  All labs, EKGs, imaging ordered have been independently reviewed and interpreted by myself.    OUTSIDE RECORDS REVIEWED: Reviewed patient's recent admission notes.   Echo 09/15/21:  IMPRESSIONS     1. Left ventricular ejection fraction, by estimation, is <20%. The left  ventricle has severely decreased function. The left ventricle demonstrates  global hypokinesis. The left ventricular internal cavity size was severely  dilated. Left ventricular  diastolic parameters are consistent with Grade II diastolic dysfunction  (pseudonormalization).   2. Right ventricular systolic function is normal. The right ventricular  size is severely enlarged.   3. Left atrial size was severely dilated.   4. Right atrial size was severely dilated.   5. The mitral valve is normal in structure. Moderate to severe mitral  valve regurgitation. No evidence of mitral stenosis.   6. Tricuspid valve regurgitation is moderate.   7. The aortic valve is normal in structure. Aortic valve regurgitation is  not visualized. No aortic stenosis is present.   8. The inferior vena cava is normal in size with greater than 50%  respiratory variability,  suggesting right atrial pressure of 3 mmHg.   Conclusion(s)/Recommendation(s): Findings consistent with ischemic  cardiomyopathy.         FINAL CLINICAL IMPRESSION(S) / ED DIAGNOSES   Final diagnoses:  Acute on chronic congestive heart failure, unspecified heart failure type ([place])     Rx / DC Orders   ED Discharge Orders     None        Note:  This document was prepared using Dragon voice recognition software and may include unintentional dictation errors.   Janiah Devinney, Delice Bison, DO 09/26/21 [phone]

## 2021-09-26 NOTE — Plan of Care (Signed)
°  Problem: Education: Goal: Knowledge of General Education information will improve Description: Including pain rating scale, medication(s)/side effects and non-pharmacologic comfort measures Outcome: Progressing   Problem: Clinical Measurements: Goal: Cardiovascular complication will be avoided Outcome: Progressing   Problem: Clinical Measurements: Goal: Respiratory complications will improve Outcome: Progressing   Problem: Safety: Goal: Ability to remain free from injury will improve Outcome: Progressing

## 2021-09-26 NOTE — Consult Note (Signed)
Walter Blair is a 76 y.o. male  [id number]  Primary Cardiologist: Neoma Laming Reason for Consultation: CHF  HPI: This is a 76 year old Walter Blair male with history of AICD atrial fibrillation status post CABG with severe LV dysfunction.  Patient has ejection fraction of 20% and is end-stage heart failure.  Patient now presents with shortness of breath.   Review of Systems: Patient has orthopnea PND and leg swelling   Past Medical History:  Diagnosis Date   AICD (automatic cardioverter/defibrillator) present    Aortic atherosclerosis (HCC)    Atrial fibrillation ([place])    a.) CHA2DS2-VASc = 7 (age x 2, CHF, CVA x 2, aortic plaque, T2DM). b.) rate/rhythm maintained on oral amiodarone; chronically anticoagulated on rivaroxaban. 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 b.) TTE 07/25/2021: EF 20-25%; global HK; G3DD; severely reduced RV function; Severe BAR; mod-severe MR, severe TR, mild AR.   HLD (hyperlipidemia)    HTN (hypertension)    Hypothyroidism    IDA (iron deficiency anemia)    Ischemic cardiomyopathy    a.) TTE 06/29/2017: EF 10-15%. b.) TTE 07/25/2021: ED 20-25%.   Left-sided cerebrovascular accident (CVA) ([place]) 2004   a.) residual weakness in LEFT hand   Long term current use of anticoagulant    a.) rivaroxaban   S/P CABG x 4 2003   Performed in [place]   T2DM (type 2 diabetes mellitus) ([place])    Thrombocytopenia ([place])    possibly associated with underlying DIC    (Not in a hospital admission)     enoxaparin (LOVENOX) injection  40 mg  Subcutaneous Daily   furosemide  20 mg Intravenous BID   insulin aspart  0-5 Units Subcutaneous QHS   insulin aspart  0-9 Units Subcutaneous TID WC   sodium chloride flush  3 mL Intravenous Q12H    Infusions:  sodium chloride      Allergies  Allergen Reactions   Lorazepam Shortness Of Breath and Other (See Comments)    Pt experienced adverse reaction and was transferred to ICU last time they were given Med Other reaction(s): Other (See Comments) Pt experienced adverse reaction and was transferred to ICU last time they were given Med    Social History   Socioeconomic History   Marital status: Widowed    Spouse name: Not on file   Number of children: Not on file   Years of education: Not on file   Highest education level: Not on file  Occupational History   Not on file  Tobacco Use   Smoking status: Former    Types: Cigarettes   Smokeless tobacco: Former    Types: Chew  Substance and Sexual Activity   Alcohol use: No   Drug use: No   Sexual activity: Not Currently  Other Topics Concern   Not on file  Social History Narrative   Lives with brother, POA   Social Determinants of Health   Financial Resource Strain: Not on file  Food Insecurity: Not on file  Transportation Needs: Not on  file  Physical Activity: Not on file  Stress: Not on file  Social Connections: Not on file  Intimate Partner Violence: Not on file    Family History  Problem Relation Age of Onset   Diabetes Brother    Hypertension Mother    Diabetes Sister     PHYSICAL EXAM: Vitals:   09/26/21 0500 09/26/21 0600  BP: 125/67 116/68  Pulse: (!) 59 60  Resp: 19 16  Temp:    SpO2: 100% 95%     Intake/Output Summary (Last 24 hours) at 09/26/2021 0852 Last data filed at 09/26/2021 0647 Gross per 24 hour  Intake --  Output 800 ml  Net -800 ml    General:  Well appearing. No respiratory difficulty HEENT: normal Neck: supple. no JVD. Carotids 2+ bilat; no bruits. No lymphadenopathy or  thryomegaly appreciated. Cor: PMI nondisplaced. Regular rate & rhythm. No rubs, gallops or murmurs. Lungs: clear Abdomen: soft, nontender, nondistended. No hepatosplenomegaly. No bruits or masses. Good bowel sounds. Extremities: no cyanosis, clubbing, rash, edema Neuro: alert & oriented x 3, cranial nerves grossly intact. moves all 4 extremities w/o difficulty. Affect pleasant.  ECG: VVI paced rhythm  Results for orders placed or performed during the hospital encounter of 09/26/21 (from the past 24 hour(s))  Basic metabolic panel     Status: Abnormal   Collection Time: 09/25/21  1:51 PM  Result Value Ref Range   Sodium 132 (L) 135 - 145 mmol/L   Potassium 4.4 3.5 - 5.1 mmol/L   Chloride 105 98 - 111 mmol/L   CO2 21 (L) 22 - 32 mmol/L   Glucose, Bld 124 (H) 70 - 99 mg/dL   BUN 21 8 - 23 mg/dL   Creatinine, Ser 1.40 (H) 0.61 - 1.24 mg/dL   Calcium 8.6 (L) 8.9 - 10.3 mg/dL   GFR, Estimated 52 (L) >60 mL/min   Anion gap 6 5 - 15  CBC     Status: Abnormal   Collection Time: 09/25/21  1:51 PM  Result Value Ref Range   WBC 4.1 4.0 - 10.5 K/uL   RBC 3.54 (L) 4.22 - 5.81 MIL/uL   Hemoglobin 9.8 (L) 13.0 - 17.0 g/dL   HCT 32.1 (L) 39.0 - 52.0 %   MCV 90.7 80.0 - 100.0 fL   MCH 27.7 26.0 - 34.0 pg   MCHC 30.5 30.0 - 36.0 g/dL   RDW 17.4 (H) 11.5 - 15.5 %   Platelets 127 (L) 150 - 400 K/uL   nRBC 0.0 0.0 - 0.2 %  Troponin I (High Sensitivity)     Status: Abnormal   Collection Time: 09/25/21  1:51 PM  Result Value Ref Range   Troponin I (High Sensitivity) 18 (H) <18 ng/L  Brain natriuretic peptide     Status: Abnormal   Collection Time: 09/25/21  1:51 PM  Result Value Ref Range   B Natriuretic Peptide 4,358.6 (H) 0.0 - 100.0 pg/mL  Troponin I (High Sensitivity)     Status: Abnormal   Collection Time: 09/25/21  4:52 PM  Result Value Ref Range   Troponin I (High Sensitivity) 18 (H) <18 ng/L  Resp Panel by RT-PCR (Flu A&B, Covid) Nasopharyngeal Swab     Status: None   Collection  Time: 09/26/21  3:32 AM   Specimen: Nasopharyngeal Swab; Nasopharyngeal(NP) swabs in vial transport medium  Result Value Ref Range   SARS Coronavirus 2 by RT PCR NEGATIVE NEGATIVE   Influenza A by PCR NEGATIVE NEGATIVE   Influenza B by  PCR NEGATIVE NEGATIVE  CBC     Status: Abnormal   Collection Time: 09/26/21  6:25 AM  Result Value Ref Range   WBC 3.7 (L) 4.0 - 10.5 K/uL   RBC 3.42 (L) 4.22 - 5.81 MIL/uL   Hemoglobin 9.6 (L) 13.0 - 17.0 g/dL   HCT 30.4 (L) 39.0 - 52.0 %   MCV 88.9 80.0 - 100.0 fL   MCH 28.1 26.0 - 34.0 pg   MCHC 31.6 30.0 - 36.0 g/dL   RDW 17.3 (H) 11.5 - 15.5 %   Platelets 103 (L) 150 - 400 K/uL   nRBC 0.0 0.0 - 0.2 %  Creatinine, serum     Status: Abnormal   Collection Time: 09/26/21  6:25 AM  Result Value Ref Range   Creatinine, Ser 1.28 (H) 0.61 - 1.24 mg/dL   GFR, Estimated 58 (L) >60 mL/min  CBG monitoring, ED     Status: Abnormal   Collection Time: 09/26/21  7:30 AM  Result Value Ref Range   Glucose-Capillary 111 (H) 70 - 99 mg/dL   DG Chest 2 View  Result Date: 09/25/2021 CLINICAL DATA:  Shortness of breath. EXAM: CHEST - 2 VIEW COMPARISON:  09/14/2021 FINDINGS: Sequelae of CABG are again identified. The cardiac silhouette remains mildly enlarged. An ICD remains in place. Thoracic aortic atherosclerosis is noted. There is mild pulmonary vascular congestion with increased accentuation of the interstitial markings compared to the prior study. A small left pleural effusion is new or larger, a and there is also a new small right pleural effusion. No pneumothorax is identified. IMPRESSION: Cardiomegaly with suspected mild pulmonary edema and small pleural effusions. Electronically Signed   By: Logan Bores M.D.   On: 09/25/2021 13:39     ASSESSMENT AND PLAN: Congestive heart failure with severe LV dysfunction and history of atrial fibrillation and GI bleed.  Advised permanently stopping Xarelto.  Advise starting Lasix drip and start Entresto if possible.  Blood  pressure is reasonable and BUN/creatinine is reasonable to start these medications.  Iliana Hutt A

## 2021-09-27 LAB — BASIC METABOLIC PANEL
Anion gap: 7 (ref 5–15)
BUN: 20 mg/dL (ref 8–23)
CO2: 25 mmol/L (ref 22–32)
Calcium: 8.4 mg/dL — ABNORMAL LOW (ref 8.9–10.3)
Chloride: 102 mmol/L (ref 98–111)
Creatinine, Ser: 1.29 mg/dL — ABNORMAL HIGH (ref 0.61–1.24)
GFR, Estimated: 57 mL/min — ABNORMAL LOW (ref >60)
Glucose, Bld: 125 mg/dL — ABNORMAL HIGH (ref 70–99)
Potassium: 3.4 mmol/L — ABNORMAL LOW (ref 3.5–5.1)
Sodium: 134 mmol/L — ABNORMAL LOW (ref 135–145)

## 2021-09-27 LAB — GLUCOSE, CAPILLARY
Glucose-Capillary: 107 mg/dL — ABNORMAL HIGH (ref 70–99)
Glucose-Capillary: 221 mg/dL — ABNORMAL HIGH (ref 70–99)
Glucose-Capillary: 160 mg/dL — ABNORMAL HIGH (ref 70–99)
Glucose-Capillary: 125 mg/dL — ABNORMAL HIGH (ref 70–99)

## 2021-09-27 MED ORDER — SACUBITRIL-VALSARTAN 24-26 MG PO TABS
1.0000 | ORAL_TABLET | Freq: Two times a day (BID) | ORAL | Status: DC
  Administered 2021-09-27 – 2021-09-29 (×3): 1 via ORAL
  Filled 2021-09-27 (×5): qty 1

## 2021-09-27 MED ORDER — POTASSIUM CHLORIDE CRYS ER 20 MEQ PO TBCR
40.0000 meq | EXTENDED_RELEASE_TABLET | Freq: Once | ORAL | Status: AC
  Administered 2021-09-27: 40 meq via ORAL
  Filled 2021-09-27: qty 2

## 2021-09-27 NOTE — Progress Notes (Signed)
SUBJECTIVE: Feeling much better   Vitals:   09/27/21 0352 09/27/21 0409 09/27/21 0742 09/27/21 1125  BP: (!) 126/50  111/64 (!) 106/48  Pulse:   (!) 59 60  Resp: 18  17 19   Temp: 97.7 F (36.5 C)  (!) 97.4 F (36.3 C) 97.6 F (36.4 C)  TempSrc: Oral  Oral Oral  SpO2: 100%  100% 100%  Weight:  56.2 kg    Height:        Intake/Output Summary (Last 24 hours) at 09/27/2021 1139 Last data filed at 09/27/2021 0600 Gross per 24 hour  Intake 751.04 ml  Output 1720 ml  Net -968.96 ml    LABS: Basic Metabolic Panel: Recent Labs    09/25/21 1351 09/26/21 0625 09/27/21 0559  NA 132*  --  134*  K 4.4  --  3.4*  CL 105  --  102  CO2 21*  --  25  GLUCOSE 124*  --  125*  BUN 21  --  20  CREATININE 1.40* 1.28* 1.29*  CALCIUM 8.6*  --  8.4*   Liver Function Tests: No results for input(s): AST, ALT, ALKPHOS, BILITOT, PROT, ALBUMIN in the last 72 hours. No results for input(s): LIPASE, AMYLASE in the last 72 hours. CBC: Recent Labs    09/25/21 1351 09/26/21 0625  WBC 4.1 3.7*  HGB 9.8* 9.6*  HCT 32.1* 30.4*  MCV 90.7 88.9  PLT 127* 103*   Cardiac Enzymes: No results for input(s): CKTOTAL, CKMB, CKMBINDEX, TROPONINI in the last 72 hours. BNP: Invalid input(s): POCBNP D-Dimer: No results for input(s): DDIMER in the last 72 hours. Hemoglobin A1C: No results for input(s): HGBA1C in the last 72 hours. Fasting Lipid Panel: No results for input(s): CHOL, HDL, LDLCALC, TRIG, CHOLHDL, LDLDIRECT in the last 72 hours. Thyroid Function Tests: No results for input(s): TSH, T4TOTAL, T3FREE, THYROIDAB in the last 72 hours.  Invalid input(s): FREET3 Anemia Panel: No results for input(s): VITAMINB12, FOLATE, FERRITIN, TIBC, IRON, RETICCTPCT in the last 72 hours.   PHYSICAL EXAM General: Well developed, well nourished, in no acute distress HEENT:  Normocephalic and atramatic Neck:  No JVD.  Lungs: Clear bilaterally to auscultation and percussion. Heart: HRRR . Normal S1 and S2  without gallops or murmurs.  Abdomen: Bowel sounds are positive, abdomen soft and non-tender  Msk:  Back normal, normal gait. Normal strength and tone for age. Extremities: No clubbing, cyanosis or edema.   Neuro: Alert and oriented X 3. Psych:  Good affect, responds appropriately  TELEMETRY: Sinus rhythm with VVI pacing  ASSESSMENT AND PLAN: Congestive heart failure with severe LV dysfunction status post CABG and AICD and paroxysmal atrial fibrillation currently in sinus rhythm.  Advise adding Entresto since blood pressure is gradually improving on top of Lasix IV.  May add carvedilol once blood pressure continues to be reasonable in the range of 90-100.  Principal Problem:   Acute on chronic combined systolic and diastolic CHF (congestive heart failure) (HCC) Active Problems:   PAF (paroxysmal atrial fibrillation) (HCC)   Diabetes mellitus without complication (HCC)   CAD (coronary artery disease)   H/O: stroke   Thrombocytopenia (HCC)   AICD (automatic cardioverter/defibrillator) present   Ischemic cardiomyopathy   S/P CABG x 4   History of GI bleed   Acute exacerbation of CHF (congestive heart failure) (HCC)    Doss Cybulski A, MD, [facility] 09/27/2021 11:39 AM

## 2021-09-27 NOTE — Progress Notes (Signed)
PROGRESS NOTE    Tahmid Stonehocker  BHA:[id number] DOB: October 26, 1945 DOA: 09/26/2021 PCP: Jodi Marble, MD  [id number]   Assessment & Plan:   Principal Problem:   Acute on chronic combined systolic and diastolic CHF (congestive heart failure) (HCC) Active Problems:   PAF (paroxysmal atrial fibrillation) (HCC)   Diabetes mellitus without complication (HCC)   CAD (coronary artery disease)   H/O: stroke   Thrombocytopenia (HCC)   AICD (automatic cardioverter/defibrillator) present   Ischemic cardiomyopathy   S/P CABG x 4   History of GI bleed   Acute exacerbation of CHF (congestive heart failure) (HCC)   Isaid Stockert is a 76 y.o. male with medical history significant for CAD s/p CABG, ischemic cardiomyopathy/combined CHF (EF<20%, G2 DD 09/15/2021), paroxysmal atrial fibrillation, CKD stage IIIa, GERD, CAD s/p CABG, essential hypertension, hypothyroidism, iron deficiency anemia, type 2 diabetes mellitus, history of CVA , hospitalized from 12/26-12/31 with acute blood loss anemia requiring 3 units PRBC and AKI who returns to the ED with a complaint of shortness of breath, lower extremity edema.  At discharge patient's Entresto and spironolactone and torsemide were discontinued due to hypotension, hyperkalemia and AKI.  Xarelto also DC'd due to GI bleed.     Acute on chronic combined systolic and diastolic CHF Ischemic cardiomyopathy s/p AICD -EF<20%, G2 DD 09/15/2021 --cardiology consulted, Dr. Humphrey Rolls saw --started on IV lasix f/b gtt Plan: --cont lasix gtt --resume Entresto, per cardio rec     PAF (paroxysmal atrial fibrillation) ([place]) - Currently in a paced rhythm  - Xarelto DC'd 2 weeks prior due to the GI bleed Plan: --cont amiodarone --cardiology advised permanently stopping Xarelto    Diabetes mellitus without complication ([place]) -during last hospitalization, discontinued glyburide, glipizide for hypoglycemia  --SSI for now     CAD s/p CABG x4 - No complaints of chest  pain, EKG nonacute - Continue atorvastatin     H/O: stroke - Continue atorvastatin     Thrombocytopenia ([place]) - Stable     History of GI bleed, 08/2021 - Currently off Xarelto.  EGD was unremarkable - Hemoglobin stable --cont home PPI   DVT prophylaxis: Lovenox SQ Code Status: Full code  Family Communication:  Level of care: Progressive Dispo:   The patient is from: home Anticipated d/c is to: home Anticipated d/c date is: 1-2 days Patient currently is not medically ready to d/c due to: IV lasix gtt   Subjective and Interval History:  Pt was vague about whether he was doing better medically.  Said that he was feeling better because he got out of ED.  Did report more urine output with IV lasix.   Objective: Vitals:   09/27/21 0409 09/27/21 0742 09/27/21 1125 09/27/21 1410  BP:  111/64 (!) 106/48 (!) 107/54  Pulse:  (!) 59 60 60  Resp:  17 19 17   Temp:  (!) 97.4 F (36.3 C) 97.6 F (36.4 C) 98.6 F (37 C)  TempSrc:  Oral Oral Oral  SpO2:  100% 100% 100%  Weight: 56.2 kg     Height:        Intake/Output Summary (Last 24 hours) at 09/27/2021 1624 Last data filed at 09/27/2021 1418 Gross per 24 hour  Intake 984 ml  Output 1720 ml  Net -736 ml   Filed Weights   09/25/21 1302 09/27/21 0046 09/27/21 0409  Weight: 61.6 kg 56.2 kg 56.2 kg    Examination:   Constitutional: NAD, AAOx3 HEENT: conjunctivae and lids normal, EOMI CV: No cyanosis.  RESP: normal respiratory effort, on RA Extremities: No effusions, edema in BLE SKIN: warm, dry Neuro: II - XII grossly intact.   Psych: Normal mood and affect.     Data Reviewed: I have personally reviewed following labs and imaging studies  CBC: Recent Labs  Lab 09/25/21 1351 09/26/21 0625  WBC 4.1 3.7*  HGB 9.8* 9.6*  HCT 32.1* 30.4*  MCV 90.7 88.9  PLT 127* 010*   Basic Metabolic Panel: Recent Labs  Lab 09/25/21 1351 09/26/21 0625 09/27/21 0559  NA 132*  --  134*  K 4.4  --  3.4*  CL 105  --  102   CO2 21*  --  25  GLUCOSE 124*  --  125*  BUN 21  --  20  CREATININE 1.40* 1.28* 1.29*  CALCIUM 8.6*  --  8.4*   GFR: Estimated Creatinine Clearance: 38.7 mL/min (A) (by C-G formula based on SCr of 1.29 mg/dL (H)). Liver Function Tests: No results for input(s): AST, ALT, ALKPHOS, BILITOT, PROT, ALBUMIN in the last 168 hours. No results for input(s): LIPASE, AMYLASE in the last 168 hours. No results for input(s): AMMONIA in the last 168 hours. Coagulation Profile: No results for input(s): INR, PROTIME in the last 168 hours. Cardiac Enzymes: No results for input(s): CKTOTAL, CKMB, CKMBINDEX, TROPONINI in the last 168 hours. BNP (last 3 results) No results for input(s): PROBNP in the last 8760 hours. HbA1C: No results for input(s): HGBA1C in the last 72 hours. CBG: Recent Labs  Lab 09/26/21 1545 09/26/21 2032 09/27/21 0747 09/27/21 1127 09/27/21 1616  GLUCAP 99 206* 107* 221* 160*   Lipid Profile: No results for input(s): CHOL, HDL, LDLCALC, TRIG, CHOLHDL, LDLDIRECT in the last 72 hours. Thyroid Function Tests: No results for input(s): TSH, T4TOTAL, FREET4, T3FREE, THYROIDAB in the last 72 hours. Anemia Panel: No results for input(s): VITAMINB12, FOLATE, FERRITIN, TIBC, IRON, RETICCTPCT in the last 72 hours. Sepsis Labs: No results for input(s): PROCALCITON, LATICACIDVEN in the last 168 hours.  Recent Results (from the past 240 hour(s))  Resp Panel by RT-PCR (Flu A&B, Covid) Nasopharyngeal Swab     Status: None   Collection Time: 09/26/21  3:32 AM   Specimen: Nasopharyngeal Swab; Nasopharyngeal(NP) swabs in vial transport medium  Result Value Ref Range Status   SARS Coronavirus 2 by RT PCR NEGATIVE NEGATIVE Final    Comment: (NOTE) SARS-CoV-2 target nucleic acids are NOT DETECTED.  The SARS-CoV-2 RNA is generally detectable in upper respiratory specimens during the acute phase of infection. The lowest concentration of SARS-CoV-2 viral copies this assay can detect  is 138 copies/mL. A negative result does not preclude SARS-Cov-2 infection and should not be used as the sole basis for treatment or other patient management decisions. A negative result may occur with  improper specimen collection/handling, submission of specimen other than nasopharyngeal swab, presence of viral mutation(s) within the areas targeted by this assay, and inadequate number of viral copies(<138 copies/mL). A negative result must be combined with clinical observations, patient history, and epidemiological information. The expected result is Negative.  Fact Sheet for Patients:  EntrepreneurPulse.com.au  Fact Sheet for Healthcare Providers:  IncredibleEmployment.be  This test is no t yet approved or cleared by the [place] FDA and  has been authorized for detection and/or diagnosis of SARS-CoV-2 by FDA under an Emergency Use Authorization (EUA). This EUA will remain  in effect (meaning this test can be used) for the duration of the COVID-19 declaration under Section 564(b)(1) of the Act, 21  U.S.C.section 360bbb-3(b)(1), unless the authorization is terminated  or revoked sooner.       Influenza A by PCR NEGATIVE NEGATIVE Final   Influenza B by PCR NEGATIVE NEGATIVE Final    Comment: (NOTE) The Xpert Xpress SARS-CoV-2/FLU/RSV plus assay is intended as an aid in the diagnosis of influenza from Nasopharyngeal swab specimens and should not be used as a sole basis for treatment. Nasal washings and aspirates are unacceptable for Xpert Xpress SARS-CoV-2/FLU/RSV testing.  Fact Sheet for Patients: EntrepreneurPulse.com.au  Fact Sheet for Healthcare Providers: IncredibleEmployment.be  This test is not yet approved or cleared by the [place] FDA and has been authorized for detection and/or diagnosis of SARS-CoV-2 by FDA under an Emergency Use Authorization (EUA). This EUA will remain in effect  (meaning this test can be used) for the duration of the COVID-19 declaration under Section 564(b)(1) of the Act, 21 U.S.C. section 360bbb-3(b)(1), unless the authorization is terminated or revoked.  Performed at [facility], [address], [place], [place] [address]       Radiology Studies: No results found.   Scheduled Meds:  amiodarone  100 mg Oral Daily   atorvastatin  80 mg Oral QPM   enoxaparin (LOVENOX) injection  40 mg Subcutaneous Daily   insulin aspart  0-5 Units Subcutaneous QHS   insulin aspart  0-9 Units Subcutaneous TID WC   levothyroxine  88 mcg Oral Q0600   pantoprazole  40 mg Oral Daily   sodium chloride flush  3 mL Intravenous Q12H   Vericiguat  5 mg Oral BID   Continuous Infusions:  sodium chloride     furosemide (LASIX) 200 mg in dextrose 5% 100 mL (2mg /mL) infusion 4 mg/hr (09/26/21 1545)     LOS: 1 day     Enzo Bi, MD [facility] If 7PM-7AM, please contact night-coverage 09/27/2021, 4:24 PM

## 2021-09-28 LAB — CBC
HCT: 29.4 % — ABNORMAL LOW (ref 39.0–52.0)
Hemoglobin: 9.5 g/dL — ABNORMAL LOW (ref 13.0–17.0)
MCH: 27.8 pg (ref 26.0–34.0)
MCHC: 32.3 g/dL (ref 30.0–36.0)
MCV: 86 fL (ref 80.0–100.0)
Platelets: 117 10*3/uL — ABNORMAL LOW (ref 150–400)
RBC: 3.42 MIL/uL — ABNORMAL LOW (ref 4.22–5.81)
RDW: 17.2 % — ABNORMAL HIGH (ref 11.5–15.5)
WBC: 4.1 10*3/uL (ref 4.0–10.5)
nRBC: 0 % (ref 0.0–0.2)

## 2021-09-28 LAB — BASIC METABOLIC PANEL
Anion gap: 9 (ref 5–15)
BUN: 19 mg/dL (ref 8–23)
CO2: 29 mmol/L (ref 22–32)
Calcium: 8.2 mg/dL — ABNORMAL LOW (ref 8.9–10.3)
Chloride: 99 mmol/L (ref 98–111)
Creatinine, Ser: 1.28 mg/dL — ABNORMAL HIGH (ref 0.61–1.24)
GFR, Estimated: 58 mL/min — ABNORMAL LOW (ref >60)
Glucose, Bld: 106 mg/dL — ABNORMAL HIGH (ref 70–99)
Potassium: 3.8 mmol/L (ref 3.5–5.1)
Sodium: 137 mmol/L (ref 135–145)

## 2021-09-28 LAB — GLUCOSE, CAPILLARY
Glucose-Capillary: 100 mg/dL — ABNORMAL HIGH (ref 70–99)
Glucose-Capillary: 219 mg/dL — ABNORMAL HIGH (ref 70–99)
Glucose-Capillary: 95 mg/dL (ref 70–99)
Glucose-Capillary: 267 mg/dL — ABNORMAL HIGH (ref 70–99)
Glucose-Capillary: 263 mg/dL — ABNORMAL HIGH (ref 70–99)

## 2021-09-28 LAB — MAGNESIUM: Magnesium: 1.4 mg/dL — ABNORMAL LOW (ref 1.7–2.4)

## 2021-09-28 MED ORDER — MAGNESIUM SULFATE 4 GM/100ML IV SOLN
4.0000 g | Freq: Once | INTRAVENOUS | Status: AC
  Administered 2021-09-28: 4 g via INTRAVENOUS
  Filled 2021-09-28: qty 100

## 2021-09-28 MED ORDER — MELATONIN 5 MG PO TABS
2.5000 mg | ORAL_TABLET | Freq: Once | ORAL | Status: AC
  Administered 2021-09-28: 2.5 mg via ORAL
  Filled 2021-09-28: qty 1

## 2021-09-28 MED ORDER — CARVEDILOL 3.125 MG PO TABS
3.1250 mg | ORAL_TABLET | Freq: Two times a day (BID) | ORAL | Status: DC
  Administered 2021-09-28 – 2021-09-29 (×2): 3.125 mg via ORAL
  Filled 2021-09-28 (×2): qty 1

## 2021-09-28 NOTE — Progress Notes (Signed)
SUBJECTIVE: Walter Blair is a 76 y.o. male history of CHF status post pacemaker/defibrillator, atrial fibrillation, chronic kidney disease, hypertension, hyperlipidemia, diabetes, CAD status post CABG, CVA.  Patient feeling better this morning. Shortness of breath and lower extremity edema improved. Denies chest pain.   Vitals:   09/27/21 1410 09/27/21 1942 09/28/21 0457 09/28/21 0538  BP: (!) 107/54 99/60  (!) 98/51  Pulse: 60 (!) 108  61  Resp: 17     Temp: 98.6 F (37 C) 98.9 F (37.2 C)  98 F (36.7 C)  TempSrc: Oral Oral  Oral  SpO2: 100% 100%  100%  Weight:   54.8 kg   Height:        Intake/Output Summary (Last 24 hours) at 09/28/2021 0823 Last data filed at 09/28/2021 0543 Gross per 24 hour  Intake 240 ml  Output 2400 ml  Net -2160 ml    LABS: Basic Metabolic Panel: Recent Labs    09/27/21 0559 09/28/21 0641  NA 134* 137  K 3.4* 3.8  CL 102 99  CO2 25 29  GLUCOSE 125* 106*  BUN 20 19  CREATININE 1.29* 1.28*  CALCIUM 8.4* 8.2*  MG  --  1.4*   Liver Function Tests: No results for input(s): AST, ALT, ALKPHOS, BILITOT, PROT, ALBUMIN in the last 72 hours. No results for input(s): LIPASE, AMYLASE in the last 72 hours. CBC: Recent Labs    09/26/21 0625 09/28/21 0641  WBC 3.7* 4.1  HGB 9.6* 9.5*  HCT 30.4* 29.4*  MCV 88.9 86.0  PLT 103* 117*   Cardiac Enzymes: No results for input(s): CKTOTAL, CKMB, CKMBINDEX, TROPONINI in the last 72 hours. BNP: Invalid input(s): POCBNP D-Dimer: No results for input(s): DDIMER in the last 72 hours. Hemoglobin A1C: No results for input(s): HGBA1C in the last 72 hours. Fasting Lipid Panel: No results for input(s): CHOL, HDL, LDLCALC, TRIG, CHOLHDL, LDLDIRECT in the last 72 hours. Thyroid Function Tests: No results for input(s): TSH, T4TOTAL, T3FREE, THYROIDAB in the last 72 hours.  Invalid input(s): FREET3 Anemia Panel: No results for input(s): VITAMINB12, FOLATE, FERRITIN, TIBC, IRON, RETICCTPCT in the last 72  hours.   PHYSICAL EXAM General: Well developed, well nourished, in no acute distress HEENT:  Normocephalic and atramatic Neck:  No JVD.  Lungs: Clear bilaterally to auscultation and percussion. Heart: HRRR . Normal S1 and S2 without gallops or murmurs.  Abdomen: Bowel sounds are positive, abdomen soft and non-tender  Msk:  Back normal, normal gait. Normal strength and tone for age. Extremities: No clubbing, cyanosis or edema.   Neuro: Alert and oriented X 3. Psych:  Good affect, responds appropriately  TELEMETRY: paced rhythm, HR 60 bpm  ASSESSMENT AND PLAN: Congestive heart failure with severe LV dysfunction status post CABG and AICD and paroxysmal atrial fibrillation currently in sinus rhythm. Advise adding carvedilol 3.125 mg twice daily. Will continue to follow.  Principal Problem:   Acute on chronic combined systolic and diastolic CHF (congestive heart failure) (HCC) Active Problems:   PAF (paroxysmal atrial fibrillation) (HCC)   Diabetes mellitus without complication (HCC)   CAD (coronary artery disease)   H/O: stroke   Thrombocytopenia (HCC)   AICD (automatic cardioverter/defibrillator) present   Ischemic cardiomyopathy   S/P CABG x 4   History of GI bleed   Acute exacerbation of CHF (congestive heart failure) ([place])    Walter Raineri, FNP-C 09/28/2021 8:23 AM

## 2021-09-28 NOTE — Progress Notes (Signed)
PROGRESS NOTE    Walter Blair  WUJ:[id number] DOB: 1946-07-06 DOA: 09/26/2021 PCP: Walter Marble, MD  [id number]   Assessment & Plan:   Principal Problem:   Acute on chronic combined systolic and diastolic CHF (congestive heart failure) (HCC) Active Problems:   PAF (paroxysmal atrial fibrillation) (HCC)   Diabetes mellitus without complication (HCC)   CAD (coronary artery disease)   H/O: stroke   Thrombocytopenia (HCC)   AICD (automatic cardioverter/defibrillator) present   Ischemic cardiomyopathy   S/P CABG x 4   History of GI bleed   Acute exacerbation of CHF (congestive heart failure) (HCC)   Walter Blair is a 76 y.o. male with medical history significant for CAD s/p CABG, ischemic cardiomyopathy/combined CHF (EF<20%, G2 DD 09/15/2021), paroxysmal atrial fibrillation, CKD stage IIIa, GERD, CAD s/p CABG, essential hypertension, hypothyroidism, iron deficiency anemia, type 2 diabetes mellitus, history of CVA , hospitalized from 12/26-12/31 with acute blood loss anemia requiring 3 units PRBC and AKI who returns to the ED with a complaint of shortness of breath, lower extremity edema.  At discharge patient's Entresto and spironolactone and torsemide were discontinued due to hypotension, hyperkalemia and AKI.  Xarelto also DC'd due to GI bleed.     Acute on chronic combined systolic and diastolic CHF Ischemic cardiomyopathy s/p AICD -EF<20%, G2 DD 09/15/2021 --cardiology consulted, Dr. Humphrey Rolls saw --started on IV lasix f/b gtt Plan: --cont lasix gtt, per cardio --cont Entresto --add coreg, per cardio     PAF (paroxysmal atrial fibrillation) (Walter Blair) - Currently in a paced rhythm  - Xarelto DC'd 2 weeks prior due to the GI bleed Plan: --cont amiodarone --add coreg, per cardio --cardiology advised permanently stopping Xarelto    Diabetes mellitus without complication (Walter Blair) -during last hospitalization, discontinued glyburide, glipizide for hypoglycemia  --SSI for now      CAD s/p CABG x4 - No complaints of chest pain, EKG nonacute - Continue atorvastatin     H/O: stroke - Continue atorvastatin     Thrombocytopenia (Walter Blair) - Stable     History of GI bleed, 08/2021 - Currently off Xarelto.  EGD was unremarkable - Hemoglobin stable --cont home PPI  Hypothyroidism --cont home Synthroid   DVT prophylaxis: Lovenox SQ Code Status: Full code  Family Communication: brother updated on speaker phone today.  Level of care: Progressive Dispo:   The patient is from: home Anticipated d/c is to: home Anticipated d/c date is: 1-2 days Patient currently is not medically ready to d/c due to: IV lasix gtt   Subjective and Interval History:  No new complaint today.     Objective: Vitals:   09/28/21 0851 09/28/21 0900 09/28/21 1202 09/28/21 1657  BP: 116/82  (!) 91/53 102/60  Pulse: 60  60 60  Resp:  19  18  Temp:   97.6 F (36.4 C) 97.8 F (36.6 C)  TempSrc:   Oral   SpO2: 100%  100% 100%  Weight:      Height:        Intake/Output Summary (Last 24 hours) at 09/28/2021 1756 Last data filed at 09/28/2021 1500 Gross per 24 hour  Intake 644.41 ml  Output 3100 ml  Net -2455.59 ml   Filed Weights   09/27/21 0046 09/27/21 0409 09/28/21 0457  Weight: 56.2 kg 56.2 kg 54.8 kg    Examination:   Constitutional: NAD, AAOx3 HEENT: conjunctivae and lids normal, EOMI CV: No cyanosis.   RESP: normal respiratory effort, on RA Extremities: No effusions, edema in BLE SKIN: warm, dry  Neuro: II - XII grossly intact.   Psych: Normal mood and affect.  Appropriate judgement and reason   Data Reviewed: I have personally reviewed following labs and imaging studies  CBC: Recent Labs  Lab 09/25/21 1351 09/26/21 0625 09/28/21 0641  WBC 4.1 3.7* 4.1  HGB 9.8* 9.6* 9.5*  HCT 32.1* 30.4* 29.4*  MCV 90.7 88.9 86.0  PLT 127* 103* 465*   Basic Metabolic Panel: Recent Labs  Lab 09/25/21 1351 09/26/21 0625 09/27/21 0559 09/28/21 0641  NA 132*  --   134* 137  K 4.4  --  3.4* 3.8  CL 105  --  102 99  CO2 21*  --  25 29  GLUCOSE 124*  --  125* 106*  BUN 21  --  20 19  CREATININE 1.40* 1.28* 1.29* 1.28*  CALCIUM 8.6*  --  8.4* 8.2*  MG  --   --   --  1.4*   GFR: Estimated Creatinine Clearance: 38.1 mL/min (A) (by C-G formula based on SCr of 1.28 mg/dL (H)). Liver Function Tests: No results for input(s): AST, ALT, ALKPHOS, BILITOT, PROT, ALBUMIN in the last 168 hours. No results for input(s): LIPASE, AMYLASE in the last 168 hours. No results for input(s): AMMONIA in the last 168 hours. Coagulation Profile: No results for input(s): INR, PROTIME in the last 168 hours. Cardiac Enzymes: No results for input(s): CKTOTAL, CKMB, CKMBINDEX, TROPONINI in the last 168 hours. BNP (last 3 results) No results for input(s): PROBNP in the last 8760 hours. HbA1C: No results for input(s): HGBA1C in the last 72 hours. CBG: Recent Labs  Lab 09/27/21 1616 09/27/21 2019 09/28/21 0853 09/28/21 1204 09/28/21 1657  GLUCAP 160* 125* 100* 219* 95   Lipid Profile: No results for input(s): CHOL, HDL, LDLCALC, TRIG, CHOLHDL, LDLDIRECT in the last 72 hours. Thyroid Function Tests: No results for input(s): TSH, T4TOTAL, FREET4, T3FREE, THYROIDAB in the last 72 hours. Anemia Panel: No results for input(s): VITAMINB12, FOLATE, FERRITIN, TIBC, IRON, RETICCTPCT in the last 72 hours. Sepsis Labs: No results for input(s): PROCALCITON, LATICACIDVEN in the last 168 hours.  Recent Results (from the past 240 hour(s))  Resp Panel by RT-PCR (Flu A&B, Covid) Nasopharyngeal Swab     Status: None   Collection Time: 09/26/21  3:32 AM   Specimen: Nasopharyngeal Swab; Nasopharyngeal(NP) swabs in vial transport medium  Result Value Ref Range Status   SARS Coronavirus 2 by RT PCR NEGATIVE NEGATIVE Final    Comment: (NOTE) SARS-CoV-2 target nucleic acids are NOT DETECTED.  The SARS-CoV-2 RNA is generally detectable in upper respiratory specimens during the acute  phase of infection. The lowest concentration of SARS-CoV-2 viral copies this assay can detect is 138 copies/mL. A negative result does not preclude SARS-Cov-2 infection and should not be used as the sole basis for treatment or other patient management decisions. A negative result may occur with  improper specimen collection/handling, submission of specimen other than nasopharyngeal swab, presence of viral mutation(s) within the areas targeted by this assay, and inadequate number of viral copies(<138 copies/mL). A negative result must be combined with clinical observations, patient history, and epidemiological information. The expected result is Negative.  Fact Sheet for Patients:  EntrepreneurPulse.com.au  Fact Sheet for Healthcare Providers:  IncredibleEmployment.be  This test is no t yet approved or cleared by the [place] FDA and  has been authorized for detection and/or diagnosis of SARS-CoV-2 by FDA under an Emergency Use Authorization (EUA). This EUA will remain  in effect (meaning this test  can be used) for the duration of the COVID-19 declaration under Section 564(b)(1) of the Act, 21 U.S.C.section 360bbb-3(b)(1), unless the authorization is terminated  or revoked sooner.       Influenza A by PCR NEGATIVE NEGATIVE Final   Influenza B by PCR NEGATIVE NEGATIVE Final    Comment: (NOTE) The Xpert Xpress SARS-CoV-2/FLU/RSV plus assay is intended as an aid in the diagnosis of influenza from Nasopharyngeal swab specimens and should not be used as a sole basis for treatment. Nasal washings and aspirates are unacceptable for Xpert Xpress SARS-CoV-2/FLU/RSV testing.  Fact Sheet for Patients: EntrepreneurPulse.com.au  Fact Sheet for Healthcare Providers: IncredibleEmployment.be  This test is not yet approved or cleared by the [place] FDA and has been authorized for detection and/or diagnosis of  SARS-CoV-2 by FDA under an Emergency Use Authorization (EUA). This EUA will remain in effect (meaning this test can be used) for the duration of the COVID-19 declaration under Section 564(b)(1) of the Act, 21 U.S.C. section 360bbb-3(b)(1), unless the authorization is terminated or revoked.  Performed at [facility], [address], [place], [place] [address]       Radiology Studies: No results found.   Scheduled Meds:  amiodarone  100 mg Oral Daily   atorvastatin  80 mg Oral QPM   carvedilol  3.125 mg Oral BID WC   enoxaparin (LOVENOX) injection  40 mg Subcutaneous Daily   insulin aspart  0-5 Units Subcutaneous QHS   insulin aspart  0-9 Units Subcutaneous TID WC   levothyroxine  88 mcg Oral Q0600   pantoprazole  40 mg Oral Daily   sacubitril-valsartan  1 tablet Oral BID   sodium chloride flush  3 mL Intravenous Q12H   Vericiguat  5 mg Oral BID   Continuous Infusions:  sodium chloride     furosemide (LASIX) 200 mg in dextrose 5% 100 mL (2mg /mL) infusion 4 mg/hr (09/28/21 1057)     LOS: 2 days     Enzo Bi, MD [facility] If 7PM-7AM, please contact night-coverage 09/28/2021, 5:56 PM

## 2021-09-28 NOTE — Progress Notes (Addendum)
[facility]) Hospital Liaison Note  This patient is currently enrolled in [facility] outpatient based Palliative care.  [facility] hospital liaison will continue to follow patient for discharge disposition.  Please call with any outpatient palliative care related questions or concerns.  Thank you,  Nadene Rubins, RN, BSN [place] [phone]

## 2021-09-29 LAB — CBC
HCT: 32.3 % — ABNORMAL LOW (ref 39.0–52.0)
Hemoglobin: 10.4 g/dL — ABNORMAL LOW (ref 13.0–17.0)
MCH: 27.6 pg (ref 26.0–34.0)
MCHC: 32.2 g/dL (ref 30.0–36.0)
MCV: 85.7 fL (ref 80.0–100.0)
Platelets: 127 10*3/uL — ABNORMAL LOW (ref 150–400)
RBC: 3.77 MIL/uL — ABNORMAL LOW (ref 4.22–5.81)
RDW: 17.2 % — ABNORMAL HIGH (ref 11.5–15.5)
WBC: 4.3 10*3/uL (ref 4.0–10.5)
nRBC: 0 % (ref 0.0–0.2)

## 2021-09-29 LAB — BASIC METABOLIC PANEL
Anion gap: 11 (ref 5–15)
BUN: 21 mg/dL (ref 8–23)
CO2: 31 mmol/L (ref 22–32)
Calcium: 8.5 mg/dL — ABNORMAL LOW (ref 8.9–10.3)
Chloride: 95 mmol/L — ABNORMAL LOW (ref 98–111)
Creatinine, Ser: 1.57 mg/dL — ABNORMAL HIGH (ref 0.61–1.24)
GFR, Estimated: 45 mL/min — ABNORMAL LOW (ref >60)
Glucose, Bld: 108 mg/dL — ABNORMAL HIGH (ref 70–99)
Potassium: 3.9 mmol/L (ref 3.5–5.1)
Sodium: 137 mmol/L (ref 135–145)

## 2021-09-29 LAB — GLUCOSE, CAPILLARY: Glucose-Capillary: 116 mg/dL — ABNORMAL HIGH (ref 70–99)

## 2021-09-29 LAB — MAGNESIUM: Magnesium: 2.2 mg/dL (ref 1.7–2.4)

## 2021-09-29 MED ORDER — SACUBITRIL-VALSARTAN 24-26 MG PO TABS: 1.0000 | ORAL_TABLET | Freq: Two times a day (BID) | ORAL | 0 refills | Status: AC

## 2021-09-29 MED ORDER — CARVEDILOL 3.125 MG PO TABS: 3.1250 mg | ORAL_TABLET | Freq: Two times a day (BID) | ORAL | 0 refills | Status: DC

## 2021-09-29 MED ORDER — TORSEMIDE 10 MG PO TABS: 10.0000 mg | ORAL_TABLET | Freq: Two times a day (BID) | ORAL | 2 refills | Status: DC

## 2021-09-29 MED ORDER — AMIODARONE HCL 200 MG PO TABS: 100.0000 mg | ORAL_TABLET | Freq: Every day | ORAL | Status: DC

## 2021-09-29 NOTE — Discharge Summary (Signed)
Physician Discharge Summary   Walter Blair  male DOB: [birth year]  RSW:[id number]  PCP: Jodi Marble, MD  Admit date: 09/26/2021 Discharge date: 09/29/2021  Admitted From: home Disposition:  home Cardiology updated brother prior to discharge.  CODE STATUS: Full code  Discharge Instructions     Discharge instructions   Complete by: As directed    You have received lasix infusion for your heart failure.  Your cardiologist has restarted you on Coreg, Entresto and torsemide.  Please take them as directed and follow up with cardiology as outpatient. [facility] Course:  For full details, please see H&P, progress notes, consult notes and ancillary notes.  Briefly,  Walter Blair is a 76 y.o. male with medical history significant for CAD s/p CABG, ischemic cardiomyopathy/combined CHF (EF<20%, G2 DD 09/15/2021), paroxysmal atrial fibrillation, CKD stage IIIa, GERD, essential hypertension, hypothyroidism, iron deficiency anemia, type 2 diabetes mellitus, history of CVA , hospitalized from 12/26-12/31 with acute blood loss anemia requiring 3 units PRBC and AKI who returned to the ED with a complaint of shortness of breath, lower extremity edema.    At last discharge patient's Entresto and spironolactone and torsemide were discontinued due to hypotension, hyperkalemia and AKI.  Xarelto also DC'd due to GI bleed.     Acute on chronic combined systolic and diastolic CHF Ischemic cardiomyopathy s/p AICD -EF<20%, G2 DD 09/15/2021 --cardiology consulted, with Dr. Humphrey Rolls who started pt on lasix gtt.  Pt responded well to lasix gtt with good urine output and improvement in dyspnea.   --Entresto and coreg resumed, per cardio. --pt was discharged on torsemide 10 mg BID, per cardio. --outpatient followup with Cardiology, already scheduled.     PAF (paroxysmal atrial fibrillation) (HCC) - Currently in a paced rhythm  - Xarelto DC'd 2 weeks prior due to GI bleed --cont  amiodarone --coreg resumed, per cardio --cardiology advised permanently stopping Xarelto     Diabetes mellitus without complication (HCC) -K9F 5.4.  during last hospitalization, discontinued glyburide, glipizide for hypoglycemia      CAD s/p CABG x4 - No complaints of chest pain, EKG nonacute - Continue atorvastatin     H/O: stroke - Continue atorvastatin     Thrombocytopenia (HCC) - Stable     History of GI bleed, 08/2021 - Currently off Xarelto.  EGD was unremarkable - Hemoglobin stable --cont home PPI   Hypothyroidism --cont home Synthroid    Discharge Diagnoses:  Principal Problem:   Acute on chronic combined systolic and diastolic CHF (congestive heart failure) (HCC) Active Problems:   PAF (paroxysmal atrial fibrillation) (HCC)   Diabetes mellitus without complication (HCC)   CAD (coronary artery disease)   H/O: stroke   Thrombocytopenia (HCC)   AICD (automatic cardioverter/defibrillator) present   Ischemic cardiomyopathy   S/P CABG x 4   History of GI bleed   Acute exacerbation of CHF (congestive heart failure) ([place])   30 Day Unplanned Readmission Risk Score    Flowsheet Row ED to Hosp-Admission (Current) from 09/26/2021 in [place] [facility] PCU  30 Day Unplanned Readmission Risk Score (%) 19.54 Filed at 09/29/2021 0801       This score is the patient's risk of an unplanned readmission within 30 days of being discharged (0 -100%). The score is based on dignosis, age, lab data, medications, orders, and past utilization.   Low:  0-14.9   Medium: 15-21.9   High: 22-29.9   Extreme: 30 and above  Discharge Instructions:  Allergies as of 09/29/2021       Reactions   Lorazepam Shortness Of Breath, Other (See Comments)   Pt experienced adverse reaction and was transferred to ICU last time they were given Med Other reaction(s): Other (See Comments) Pt experienced adverse reaction and was transferred to ICU last time they were given Med    Pork-derived Products Other (See Comments)   Cultural reasons        Medication List     STOP taking these medications    oseltamivir 75 MG capsule Commonly known as: TAMIFLU       TAKE these medications    amiodarone 200 MG tablet Commonly known as: PACERONE Take 0.5 tablets (100 mg total) by mouth daily. Home med What changed:  how much to take how to take this when to take this additional instructions   atorvastatin 80 MG tablet Commonly known as: LIPITOR Take 80 mg by mouth daily.   carvedilol 3.125 MG tablet Commonly known as: COREG Take 1 tablet (3.125 mg total) by mouth 2 (two) times daily with a meal.   ferrous sulfate 325 (65 FE) MG tablet Commonly known as: FerrouSul Take 1 tablet (325 mg total) by mouth daily with breakfast. Take 1 tablet twice a day every other day for 90 days. What changed:  when to take this additional instructions   folic acid 1 MG tablet Commonly known as: FOLVITE Take 1 mg by mouth every morning.   levothyroxine 88 MCG tablet Commonly known as: SYNTHROID Take 88 mcg by mouth daily before breakfast.   pantoprazole 20 MG tablet Commonly known as: PROTONIX Take 20 mg by mouth daily.   sacubitril-valsartan 24-26 MG Commonly known as: ENTRESTO Take 1 tablet by mouth 2 (two) times daily.   torsemide 10 MG tablet Commonly known as: DEMADEX Take 1 tablet (10 mg total) by mouth 2 (two) times daily.   Vericiguat 5 MG Tabs Take 5 mg by mouth 2 (two) times daily.         Follow-up Information     Jodi Marble, MD Follow up in 1 week(s).   Specialty: Internal Medicine Contact information: [place] [address] [phone]         Dionisio David, MD Follow up on 10/02/2021.   Specialty: Cardiology Why: 9:30 am. Contact information: [address] [place] [id number] [phone]                 Allergies  Allergen Reactions   Lorazepam Shortness Of Breath and Other (See  Comments)    Pt experienced adverse reaction and was transferred to ICU last time they were given Med Other reaction(s): Other (See Comments) Pt experienced adverse reaction and was transferred to ICU last time they were given Med   Pork-Derived Products Other (See Comments)    Cultural reasons     The results of significant diagnostics from this hospitalization (including imaging, microbiology, ancillary and laboratory) are listed below for reference.   Consultations:   Procedures/Studies: DG Chest 2 View  Result Date: 09/25/2021 CLINICAL DATA:  Shortness of breath. EXAM: CHEST - 2 VIEW COMPARISON:  09/14/2021 FINDINGS: Sequelae of CABG are again identified. The cardiac silhouette remains mildly enlarged. An ICD remains in place. Thoracic aortic atherosclerosis is noted. There is mild pulmonary vascular congestion with increased accentuation of the interstitial markings compared to the prior study. A small left pleural effusion is new or larger, a and there is also a new small  right pleural effusion. No pneumothorax is identified. IMPRESSION: Cardiomegaly with suspected mild pulmonary edema and small pleural effusions. Electronically Signed   By: Logan Bores M.D.   On: 09/25/2021 13:39   DG Chest 2 View  Result Date: 09/14/2021 CLINICAL DATA:  Shortness of breath, chest pain, weakness, and nausea for 3 days. EXAM: CHEST - 2 VIEW COMPARISON:  07/24/2021 FINDINGS: Postoperative changes in the mediastinum. Cardiac pacemaker. Mild cardiac enlargement. No vascular congestion. There is evidence of peribronchial thickening with central interstitial pattern to the lungs probably representing bronchiectasis and chronic bronchitis. No developing consolidation or edema. Minimal fluid or thickened pleura along the left costophrenic angle is unchanged since prior study. No pneumothorax. Calcification of the aorta. IMPRESSION: Chronic bronchitic changes in the lungs. Mild fluid or thickened pleura in the  left costophrenic angle. No developing consolidation or edema. Electronically Signed   By: Lucienne Capers M.D.   On: 09/14/2021 21:40   ECHOCARDIOGRAM COMPLETE  Result Date: 09/15/2021    ECHOCARDIOGRAM REPORT   Patient Name:   ERRIK MITCHELLE Date of Exam: 09/15/2021 Medical Rec #:  [id number]      Height:       64.0 in Accession #:    [id number]     Weight:       123.9 lb Date of Birth:  Oct 13, 1945       BSA:          1.596 m Patient Age:    76 years       BP:           100/62 mmHg Patient Gender: M              HR:           58 bpm. Exam Location:  [facility] Procedure: 2D Echo, Cardiac Doppler and Color Doppler Indications:     CHF-acute systolic 229.79 / G92.11  History:         Patient has prior history of Echocardiogram examinations, most                  recent 07/25/2021. Prior CABG, Arrythmias:Atrial Fibrillation;                  Risk Factors:Diabetes and Hypertension. AICD.  Sonographer:     Sherrie Sport Referring Phys:  [place] Diagnosing Phys: [place]  1. Left ventricular ejection fraction, by estimation, is <20%. The left ventricle has severely decreased function. The left ventricle demonstrates global hypokinesis. The left ventricular internal cavity size was severely dilated. Left ventricular diastolic parameters are consistent with Grade II diastolic dysfunction (pseudonormalization).  2. Right ventricular systolic function is normal. The right ventricular size is severely enlarged.  3. Left atrial size was severely dilated.  4. Right atrial size was severely dilated.  5. The mitral valve is normal in structure. Moderate to severe mitral valve regurgitation. No evidence of mitral stenosis.  6. Tricuspid valve regurgitation is moderate.  7. The aortic valve is normal in structure. Aortic valve regurgitation is not visualized. No aortic stenosis is present.  8. The inferior vena cava is normal in size with greater than 50% respiratory variability, suggesting right atrial  pressure of 3 mmHg. Conclusion(s)/Recommendation(s): Findings consistent with ischemic cardiomyopathy. FINDINGS  Left Ventricle: Left ventricular ejection fraction, by estimation, is <20%. The left ventricle has severely decreased function. The left ventricle demonstrates global hypokinesis. The left ventricular internal cavity size was severely dilated. There is no left ventricular hypertrophy. Left ventricular  diastolic parameters are consistent with Grade II diastolic dysfunction (pseudonormalization). Right Ventricle: The right ventricular size is severely enlarged. No increase in right ventricular wall thickness. Right ventricular systolic function is normal. Left Atrium: Left atrial size was severely dilated. Right Atrium: Right atrial size was severely dilated. Pericardium: There is no evidence of pericardial effusion. Mitral Valve: The mitral valve is normal in structure. Moderate to severe mitral valve regurgitation. No evidence of mitral valve stenosis. MV peak gradient, 8.8 mmHg. The mean mitral valve gradient is 4.0 mmHg. Tricuspid Valve: The tricuspid valve is normal in structure. Tricuspid valve regurgitation is moderate . No evidence of tricuspid stenosis. Aortic Valve: The aortic valve is normal in structure. Aortic valve regurgitation is not visualized. No aortic stenosis is present. Aortic valve mean gradient measures 2.0 mmHg. Aortic valve peak gradient measures 3.7 mmHg. Aortic valve area, by VTI measures 1.81 cm. Pulmonic Valve: The pulmonic valve was normal in structure. Pulmonic valve regurgitation is mild. No evidence of pulmonic stenosis. Aorta: The aortic root is normal in size and structure. Venous: The inferior vena cava is normal in size with greater than 50% respiratory variability, suggesting right atrial pressure of 3 mmHg. IAS/Shunts: No atrial level shunt detected by color flow Doppler.  LEFT VENTRICLE PLAX 2D LVIDd:         5.40 cm LVIDs:         4.90 cm LV PW:         1.20 cm LV  IVS:        0.60 cm LVOT diam:     2.00 cm LV SV:         29 LV SV Index:   18 LVOT Area:     3.14 cm  LV Volumes (MOD) LV vol d, MOD A2C: 153.0 ml LV vol d, MOD A4C: 168.0 ml LV vol s, MOD A2C: 113.0 ml LV vol s, MOD A4C: 128.0 ml LV SV MOD A2C:     40.0 ml LV SV MOD A4C:     168.0 ml LV SV MOD BP:      40.6 ml RIGHT VENTRICLE RV Basal diam:  5.60 cm RV S prime:     9.36 cm/s TAPSE (M-mode): 4.1 cm LEFT ATRIUM             Index        RIGHT ATRIUM           Index LA diam:        4.60 cm 2.88 cm/m   RA Area:     27.70 cm LA Vol (A2C):   86.1 ml 53.94 ml/m  RA Volume:   97.40 ml  61.02 ml/m LA Vol (A4C):   90.2 ml 56.51 ml/m LA Biplane Vol: 91.1 ml 57.08 ml/m  AORTIC VALVE                    PULMONIC VALVE AV Area (Vmax):    1.40 cm     PV Vmax:        0.48 m/s AV Area (Vmean):   1.50 cm     PV Vmean:       33.300 cm/s AV Area (VTI):     1.81 cm     PV VTI:         0.078 m AV Vmax:           96.65 cm/s   PV Peak grad:   0.9 mmHg AV Vmean:  62.900 cm/s  PV Mean grad:   1.0 mmHg AV VTI:            0.160 m      RVOT Peak grad: 4 mmHg AV Peak Grad:      3.7 mmHg AV Mean Grad:      2.0 mmHg LVOT Vmax:         43.10 cm/s LVOT Vmean:        30.000 cm/s LVOT VTI:          0.092 m LVOT/AV VTI ratio: 0.58  AORTA Ao Root diam: 3.00 cm MITRAL VALVE                TRICUSPID VALVE MV Area (PHT): 6.27 cm     TR Peak grad:   37.0 mmHg MV Area VTI:   1.53 cm     TR Vmax:        304.00 cm/s MV Peak grad:  8.8 mmHg MV Mean grad:  4.0 mmHg     SHUNTS MV Vmax:       1.48 m/s     Systemic VTI:  0.09 m MV Vmean:      94.4 cm/s    Systemic Diam: 2.00 cm MV Decel Time: 121 msec     Pulmonic VTI:  0.180 m MV E velocity: 114.00 cm/s Neoma Laming Electronically signed by Neoma Laming Signature Date/Time: 09/15/2021/1:16:32 PM    Final       Labs: BNP (last 3 results) Recent Labs    07/24/21 1011 09/14/21 2038 09/25/21 1351  BNP 3,305.3* 2,575.8* 3,329.5*   Basic Metabolic Panel: Recent Labs  Lab 09/25/21 1351  09/26/21 0625 09/27/21 0559 09/28/21 0641 09/29/21 0743  NA 132*  --  134* 137 137  K 4.4  --  3.4* 3.8 3.9  CL 105  --  102 99 95*  CO2 21*  --  25 29 31   GLUCOSE 124*  --  125* 106* 108*  BUN 21  --  20 19 21   CREATININE 1.40* 1.28* 1.29* 1.28* 1.57*  CALCIUM 8.6*  --  8.4* 8.2* 8.5*  MG  --   --   --  1.4* 2.2   Liver Function Tests: No results for input(s): AST, ALT, ALKPHOS, BILITOT, PROT, ALBUMIN in the last 168 hours. No results for input(s): LIPASE, AMYLASE in the last 168 hours. No results for input(s): AMMONIA in the last 168 hours. CBC: Recent Labs  Lab 09/25/21 1351 09/26/21 0625 09/28/21 0641 09/29/21 0743  WBC 4.1 3.7* 4.1 4.3  HGB 9.8* 9.6* 9.5* 10.4*  HCT 32.1* 30.4* 29.4* 32.3*  MCV 90.7 88.9 86.0 85.7  PLT 127* 103* 117* 127*   Cardiac Enzymes: No results for input(s): CKTOTAL, CKMB, CKMBINDEX, TROPONINI in the last 168 hours. BNP: Invalid input(s): POCBNP CBG: Recent Labs  Lab 09/28/21 1204 09/28/21 1657 09/28/21 2028 09/28/21 2148 09/29/21 0747  GLUCAP 219* 95 267* 263* 116*   D-Dimer No results for input(s): DDIMER in the last 72 hours. Hgb A1c No results for input(s): HGBA1C in the last 72 hours. Lipid Profile No results for input(s): CHOL, HDL, LDLCALC, TRIG, CHOLHDL, LDLDIRECT in the last 72 hours. Thyroid function studies No results for input(s): TSH, T4TOTAL, T3FREE, THYROIDAB in the last 72 hours.  Invalid input(s): FREET3 Anemia work up No results for input(s): VITAMINB12, FOLATE, FERRITIN, TIBC, IRON, RETICCTPCT in the last 72 hours. Urinalysis    Component Value Date/Time   COLORURINE YELLOW 09/14/2021 [place] 09/14/2021  [place] (A) 08/05/2021 1307   LABSPEC 1.015 09/14/2021 0727   PHURINE 5.0 09/14/2021 0727   GLUCOSEU NEGATIVE 09/14/2021 0727   HGBUR NEGATIVE 09/14/2021 0727   BILIRUBINUR NEGATIVE 09/14/2021 0727   BILIRUBINUR Negative 08/05/2021 [place]  09/14/2021 0727   PROTEINUR 30 (A) 09/14/2021 0727   NITRITE NEGATIVE 09/14/2021 0727   LEUKOCYTESUR SMALL (A) 09/14/2021 0727   Sepsis Labs Invalid input(s): PROCALCITONIN,  WBC,  LACTICIDVEN Microbiology Recent Results (from the past 240 hour(s))  Resp Panel by RT-PCR (Flu A&B, Covid) Nasopharyngeal Swab     Status: None   Collection Time: 09/26/21  3:32 AM   Specimen: Nasopharyngeal Swab; Nasopharyngeal(NP) swabs in vial transport medium  Result Value Ref Range Status   SARS Coronavirus 2 by RT PCR NEGATIVE NEGATIVE Final    Comment: (NOTE) SARS-CoV-2 target nucleic acids are NOT DETECTED.  The SARS-CoV-2 RNA is generally detectable in upper respiratory specimens during the acute phase of infection. The lowest concentration of SARS-CoV-2 viral copies this assay can detect is 138 copies/mL. A negative result does not preclude SARS-Cov-2 infection and should not be used as the sole basis for treatment or other patient management decisions. A negative result may occur with  improper specimen collection/handling, submission of specimen other than nasopharyngeal swab, presence of viral mutation(s) within the areas targeted by this assay, and inadequate number of viral copies(<138 copies/mL). A negative result must be combined with clinical observations, patient history, and epidemiological information. The expected result is Negative.  Fact Sheet for Patients:  EntrepreneurPulse.com.au  Fact Sheet for Healthcare Providers:  IncredibleEmployment.be  This test is no t yet approved or cleared by the [place] FDA and  has been authorized for detection and/or diagnosis of SARS-CoV-2 by FDA under an Emergency Use Authorization (EUA). This EUA will remain  in effect (meaning this test can be used) for the duration of the COVID-19 declaration under Section 564(b)(1) of the Act, 21 U.S.C.section 360bbb-3(b)(1), unless the authorization is  terminated  or revoked sooner.       Influenza A by PCR NEGATIVE NEGATIVE Final   Influenza B by PCR NEGATIVE NEGATIVE Final    Comment: (NOTE) The Xpert Xpress SARS-CoV-2/FLU/RSV plus assay is intended as an aid in the diagnosis of influenza from Nasopharyngeal swab specimens and should not be used as a sole basis for treatment. Nasal washings and aspirates are unacceptable for Xpert Xpress SARS-CoV-2/FLU/RSV testing.  Fact Sheet for Patients: EntrepreneurPulse.com.au  Fact Sheet for Healthcare Providers: IncredibleEmployment.be  This test is not yet approved or cleared by the [place] FDA and has been authorized for detection and/or diagnosis of SARS-CoV-2 by FDA under an Emergency Use Authorization (EUA). This EUA will remain in effect (meaning this test can be used) for the duration of the COVID-19 declaration under Section 564(b)(1) of the Act, 21 U.S.C. section 360bbb-3(b)(1), unless the authorization is terminated or revoked.  Performed at [facility] [address] [place], [place] [address]      Total time spend on discharging this patient, including the last patient exam, discussing the hospital stay, instructions for ongoing care as it relates to all pertinent caregivers, as well as preparing the medical discharge records, prescriptions, and/or referrals as applicable, is 35 minutes.    Enzo Bi, MD  [facility] 09/29/2021, 9:58 AM

## 2021-09-29 NOTE — Progress Notes (Signed)
SUBJECTIVE: Walter Blair is a 76 y.o. male history of CHF status post pacemaker/defibrillator, atrial fibrillation, chronic kidney disease, hypertension, hyperlipidemia, diabetes, CAD status post CABG, CVA.   Patient feeling better this morning. Shortness of breath and lower extremity edema improved. Denies chest pain.   Vitals:   09/29/21 0425 09/29/21 0427 09/29/21 0605 09/29/21 0746  BP: 106/67 103/61  100/62  Pulse: 60 61  (!) 59  Resp: 18 16  14   Temp: 97.6 F (36.4 C) 97.6 F (36.4 C)  97.9 F (36.6 C)  TempSrc:    Oral  SpO2: 99% 98%  100%  Weight:   53.7 kg   Height:        Intake/Output Summary (Last 24 hours) at 09/29/2021 0830 Last data filed at 09/29/2021 0600 Gross per 24 hour  Intake 674.41 ml  Output 1650 ml  Net -975.59 ml    LABS: Basic Metabolic Panel: Recent Labs    09/27/21 0559 09/28/21 0641  NA 134* 137  K 3.4* 3.8  CL 102 99  CO2 25 29  GLUCOSE 125* 106*  BUN 20 19  CREATININE 1.29* 1.28*  CALCIUM 8.4* 8.2*  MG  --  1.4*   Liver Function Tests: No results for input(s): AST, ALT, ALKPHOS, BILITOT, PROT, ALBUMIN in the last 72 hours. No results for input(s): LIPASE, AMYLASE in the last 72 hours. CBC: Recent Labs    09/28/21 0641 09/29/21 0743  WBC 4.1 4.3  HGB 9.5* 10.4*  HCT 29.4* 32.3*  MCV 86.0 85.7  PLT 117* 127*   Cardiac Enzymes: No results for input(s): CKTOTAL, CKMB, CKMBINDEX, TROPONINI in the last 72 hours. BNP: Invalid input(s): POCBNP D-Dimer: No results for input(s): DDIMER in the last 72 hours. Hemoglobin A1C: No results for input(s): HGBA1C in the last 72 hours. Fasting Lipid Panel: No results for input(s): CHOL, HDL, LDLCALC, TRIG, CHOLHDL, LDLDIRECT in the last 72 hours. Thyroid Function Tests: No results for input(s): TSH, T4TOTAL, T3FREE, THYROIDAB in the last 72 hours.  Invalid input(s): FREET3 Anemia Panel: No results for input(s): VITAMINB12, FOLATE, FERRITIN, TIBC, IRON, RETICCTPCT in the last 72  hours.   PHYSICAL EXAM General: Well developed, well nourished, in no acute distress HEENT:  Normocephalic and atramatic Neck:  No JVD.  Lungs: Clear bilaterally to auscultation and percussion. Heart: HRRR . Normal S1 and S2 without gallops or murmurs.  Abdomen: Bowel sounds are positive, abdomen soft and non-tender  Msk:  Back normal, normal gait. Normal strength and tone for age. Extremities: No clubbing, cyanosis or edema.   Neuro: Alert and oriented X 3. Psych:  Good affect, responds appropriately  TELEMETRY: paced rhythm, HR 60 bpm  ASSESSMENT AND PLAN: Congestive heart failure with severe LV dysfunction status post CABG and AICD and paroxysmal atrial fibrillation currently in sinus rhythm. IV Lasix stopped due to decreasing kidney function. May be dischaged today on torsemide 10 mg twice daily with follow up in office Friday 10/02/21 at 9:30.   Principal Problem:   Acute on chronic combined systolic and diastolic CHF (congestive heart failure) (HCC) Active Problems:   PAF (paroxysmal atrial fibrillation) (HCC)   Diabetes mellitus without complication (HCC)   CAD (coronary artery disease)   H/O: stroke   Thrombocytopenia (HCC)   AICD (automatic cardioverter/defibrillator) present   Ischemic cardiomyopathy   S/P CABG x 4   History of GI bleed   Acute exacerbation of CHF (congestive heart failure) ([place])    Mazzie Brodrick, FNP-C 09/29/2021 8:30 AM

## 2021-09-29 NOTE — TOC Transition Note (Addendum)
Transition of Care [facility]) - CM/SW Discharge Note   Patient Details  Name: Walter Blair MRN: [id number] Date of Birth: 1945/09/30  [facility]) CM/SW Contact:  Alberteen Sam, LCSW Phone Number: 09/29/2021, 10:28 AM   Clinical Narrative:     Patient to discharge home today, is followed by [facility] Palliative services at discharge.   Patient is active with [place] with [facility] has been informed of patient's discharge today and home health orders have been requested from MD for PT and RN. Dr. Billie Ruddy aware and in agreement per epic messenger.   No further discharge needs identified at this time.    Final next level of care: [place] Barriers to Discharge: No Barriers Identified   Patient Goals and CMS Choice Patient states their goals for this hospitalization and ongoing recovery are:: to go home CMS Medicare.gov Compare Post Acute Care list provided to:: Patient Choice offered to / list presented to : Patient  Discharge Placement                    Patient and family notified of of transfer: 09/29/21  Discharge Plan and Services                          HH Arranged: PT, RN [facility] Agency: [place] ([place]) Date [place]: 09/29/21 Time [place]: 1028 Representative spoke with at [place]: [place] ([place]) Interventions     Readmission Risk Interventions No flowsheet data found.

## 2021-09-29 NOTE — Care Management Important Message (Signed)
Important Message  Patient Details  Name: Walter Blair MRN: [id number] Date of Birth: January 03, 1946   Medicare Important Message Given:  Yes     Dannette Barbara 09/29/2021, 12:41 PM

## 2021-10-01 NOTE — Telephone Encounter (Signed)
Patient was admitted into the hospital and discharged on 09/29/21.  Per Dr. Diamantina Providence, with his comorbidities and numerous hospitalizations, we should discontinue the order for BCG as I think the risks outweigh the benefits.  Change the follow up plan to cystoscopy in the office at the end of February.  Called and spoke to patient's brother.  He expressed understanding.  Cystoscopy scheduled for the end of February.

## 2021-10-07 ENCOUNTER — Telehealth: Payer: Self-pay

## 2021-10-07 NOTE — Telephone Encounter (Signed)
840 am.  Return call made to [place] at the request of Christin Gusler, NP.  Llana Aliment notes patient has been discharged from the hospital recently with CHF exacerbation.  Brother is in need of additional support and needs assistance.  He notes nothing more can be done for patient other than medication management but this is complicated by patient's kidney functions.  Brother asks for clarification between hospice and palliative care.  Education provided on the differences.  Brother states his mother was under hospice for about 4 years so they are familiar with services but he is not sure how his brother would receive this.  Advised if patient qualifies for hospice support a community liaison would be able to make a home visit to further discuss this option with patient.  Brother is open to this.  Advised that I would consult with Christin Gusler, NP regarding concerns and we would follow up later today.  855 am.  Phone call made to Christin Gusler, NP and advised of above conversation.  [place] Liaison will follow up with brother and schedule a informational home visit to further explore hospice as an option.

## 2021-10-08 ENCOUNTER — Telehealth: Payer: Self-pay | Admitting: Nurse Practitioner

## 2021-10-08 NOTE — Telephone Encounter (Signed)
Warden Fillers, Walter Blair called to notify wishes are to proceed with Hospice services. We talked about hospice process. Order received.   Total time 15 minutes Documentation 5 minutes Phone discussion 10 mintues

## 2021-10-13 ENCOUNTER — Telehealth: Payer: Self-pay | Admitting: Nurse Practitioner

## 2021-10-13 NOTE — Telephone Encounter (Signed)
[name] brother Walter Blair called, I returned call with wishes to proceed with hospice services. Discussion with Walter Blair about goc. Walter Blair shared they called the Oncologist and was told there are no further procedures they will be able to do for him, he is comfort care with his bladder cancer. He would not tolerate any procedures or chemotherapy due to his CHF/ckd.   We talked about CHF, he is a rotating through the hospital very frequently, he is getting extra doses of torsemide. Cardiology stopped multiple diuretic as he was not tolerating with worsening renal. The AV visit was likely on a good day when he received extra doses of torsemide. He does not want to continue to go to the hospital. He wants to stay at home and be kept comfortable. His weight is varying 2 to 5 lbs daily. When I was there he had audible wheezes, respiratory mild distress even walking down the stairs and at rest which Walter Blair shared that is his baseline. It takes him a long time to get dressed due to fatigue, shortness of breath. Cardiology felt like hospice would be beneficial. Hospice Physicians revisited case and in agreement to proceed with rescheduling AV visit.

## 2021-11-04 ENCOUNTER — Encounter: Payer: Self-pay | Admitting: Hospice and Palliative Medicine

## 2021-11-04 ENCOUNTER — Other Ambulatory Visit: Payer: Self-pay

## 2021-11-05 ENCOUNTER — Ambulatory Visit: Payer: Medicare Other | Admitting: Gastroenterology

## 2021-11-11 ENCOUNTER — Ambulatory Visit (INDEPENDENT_AMBULATORY_CARE_PROVIDER_SITE_OTHER): Payer: No Typology Code available for payment source | Admitting: Urology

## 2021-11-11 ENCOUNTER — Other Ambulatory Visit: Payer: Self-pay

## 2021-11-11 ENCOUNTER — Encounter: Payer: Self-pay | Admitting: Urology

## 2021-11-11 VITALS — BP 89/57 | HR 91 | Ht 66.0 in | Wt 118.0 lb

## 2021-11-11 DIAGNOSIS — D494 Neoplasm of unspecified behavior of bladder: Secondary | ICD-10-CM

## 2021-11-11 DIAGNOSIS — C670 Malignant neoplasm of trigone of bladder: Secondary | ICD-10-CM

## 2021-11-11 NOTE — Progress Notes (Signed)
° °  11/11/2021 2:45 PM   Alric Mcconaha Feb 03, 1946 [id number]  Reason for visit: Follow up bladder cancer  HPI: 76 year old comorbid male here with his brother today who provides the history.  Briefly he presented with gross hematuria and found to have a 3.5 cm bladder tumor and underwent a TURBT with gemcitabine on 08/07/2021, and pathology showed HG Ta disease.  All visible tumor was removed at that time.  With his comorbidities they opted to defer a second look TURBT and pursue induction BCG.  He never got induction BCG as he had multiple hospital admissions for CHF exacerbations as well as a GI bleed, and has now been transitioned to hospice care.  They would like to defer surveillance cystoscopy that was scheduled for today, and we discussed return precautions like gross hematuria.  He is not having any urinary problems or gross hematuria since the surgery.  They were amenable to scheduling a 34-month follow-up in the event that his health improves and he would like to continue surveillance cystoscopy in the future.  RTC 6 months symptom check   Billey Co, MD  [facility] [address] [place] [employer],  [address] [phone]

## 2021-11-12 LAB — URINALYSIS, COMPLETE
Bilirubin, UA: NEGATIVE
Ketones, UA: NEGATIVE
Leukocytes,UA: NEGATIVE
Nitrite, UA: NEGATIVE
Protein,UA: NEGATIVE
RBC, UA: NEGATIVE
Specific Gravity, UA: 1.01 (ref 1.005–1.030)
Urobilinogen, Ur: 0.2 mg/dL (ref 0.2–1.0)
pH, UA: 6 (ref 5.0–7.5)

## 2021-11-12 LAB — MICROSCOPIC EXAMINATION: Bacteria, UA: NONE SEEN

## 2021-11-24 ENCOUNTER — Other Ambulatory Visit: Payer: Self-pay | Admitting: *Deleted

## 2021-11-24 DIAGNOSIS — D509 Iron deficiency anemia, unspecified: Secondary | ICD-10-CM

## 2021-11-27 NOTE — Progress Notes (Unsigned)
[facility]  [phone] [phone]  ID: Walter Blair OB: Jan 24, 1946  MR#: [id number]  YSA#:[id number]  Patient Care Team: Jodi Marble, MD as PCP - General (Internal Medicine)  CHIEF COMPLAINT: Pancytopenia.  INTERVAL HISTORY: Patient is a 76 year old male who was initially evaluated in the hospital who returns to clinic 1 week after discharge.  He feels improved since his hospitalization, but not back to his baseline.  He continues to have weakness and fatigue.  He has no neurologic complaints.  He denies any recent fevers.  He has a good appetite and denies weight loss.  He has no chest pain, shortness of breath, cough, or hemoptysis.  He denies any nausea, vomiting, constipation, or diarrhea.  He has no melena or hematochezia.  He has no urinary complaints.  Patient offers no further specific complaints today.  REVIEW OF SYSTEMS:   Review of Systems  Constitutional:  Positive for malaise/fatigue. Negative for fever and weight loss.  Respiratory: Negative.  Negative for cough, hemoptysis and shortness of breath.   Cardiovascular: Negative.  Negative for chest pain and leg swelling.  Gastrointestinal: Negative.  Negative for abdominal pain.  Genitourinary: Negative.  Negative for dysuria.  Musculoskeletal: Negative.  Negative for back pain.  Skin: Negative.  Negative for rash.  Neurological:  Positive for weakness. Negative for dizziness, focal weakness and headaches.  Psychiatric/Behavioral: Negative.  The patient is not nervous/anxious.    As per HPI. Otherwise, a complete review of systems is negative.  PAST MEDICAL HISTORY: Past Medical History:  Diagnosis Date   AICD (automatic cardioverter/defibrillator) present    Aortic atherosclerosis ([place])    Atrial fibrillation ([place])    a.) CHA2DS2-VASc = 7 (age x 2, CHF, CVA x 2, aortic plaque, T2DM). b.) rate/rhythm maintained on oral amiodarone; chronically anticoagulated on rivaroxaban.  c.) s/p DCCV 07/01/2017 and 07/05/2017   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   a.) cystoscopy 08/05/2021 --> ~2-3 cm papillary tumor at the LEFT bladder base   CAD (coronary artery disease)    CHF (congestive heart failure) (HCC)    CKD (chronic kidney disease), stage III (HCC)    Dyspnea    GERD (gastroesophageal reflux disease)    HFrEF (heart failure with reduced ejection fraction) ([place])    a.) TTE 06/29/2017: EF 10-15%; diffuse HK; mild AR, severe MR/TR, mod PR; BAE. b.) TTE 07/25/2021: EF 20-25%; global HK; G3DD; severely reduced RV function; Severe BAR; mod-severe MR, severe TR, mild AR.   HLD (hyperlipidemia)    HTN (hypertension)    Hypothyroidism    IDA (iron deficiency anemia)    Ischemic cardiomyopathy    a.) TTE 06/29/2017: EF 10-15%. b.) TTE 07/25/2021: ED 20-25%.   Left-sided cerebrovascular accident (CVA) ([place]) 2004   a.) residual weakness in LEFT hand   Long term current use of anticoagulant    a.) rivaroxaban   S/P CABG x 4 2003   Performed in [place]   T2DM (type 2 diabetes mellitus) ([place])    Thrombocytopenia ([place])    possibly associated with underlying DIC    PAST SURGICAL HISTORY: Past Surgical History:  Procedure Laterality Date   BLADDER INSTILLATION N/A 08/07/2021   Procedure: BLADDER INSTILLATION OF GEMCITABINE;  Surgeon: Billey Co, MD;  Location: [facility];  Service: Urology;  Laterality: N/A;   CARDIAC DEFIBRILLATOR PLACEMENT     CARDIOVERSION N/A 07/01/2017   Procedure: CARDIOVERSION;  Surgeon: Dionisio David, MD;  Location: [facility];  Service: Cardiovascular;  Laterality: N/A;  N/A 07/05/2017  ° Procedure: CARDIOVERSION;  Surgeon: Khan, Shaukat A, MD;  Location: [facility];  Service: Cardiovascular;  Laterality: N/A;  ° COLONOSCOPY WITH PROPOFOL N/A 12/30/2020  ° Procedure: COLONOSCOPY WITH PROPOFOL;  Surgeon: Anna, Kiran, MD;  Location: [facility];  Service: Gastroenterology;  Laterality: N/A;  ° CORONARY ARTERY BYPASS GRAFT N/A 2003   ° Procedure: Four-vessel CABG performed in [place]  ° CYSTOSCOPY W/ RETROGRADES Bilateral 08/07/2021  ° Procedure: CYSTOSCOPY WITH RETROGRADE PYELOGRAM;  Surgeon: Sninsky, Brian C, MD;  Location: [facility];  Service: Urology;  Laterality: Bilateral;  ° ESOPHAGOGASTRODUODENOSCOPY  12/30/2020  ° Procedure: ESOPHAGOGASTRODUODENOSCOPY (EGD);  Surgeon: Anna, Kiran, MD;  Location: [facility];  Service: Gastroenterology;;  ° ESOPHAGOGASTRODUODENOSCOPY N/A 09/17/2021  ° Procedure: ESOPHAGOGASTRODUODENOSCOPY (EGD);  Surgeon: Tahiliani, Varnita B, MD;  Location: [facility];  Service: Endoscopy;  Laterality: N/A;  ° GIVENS CAPSULE STUDY N/A 02/02/2021  ° Procedure: GIVENS CAPSULE STUDY;  Surgeon: Anna, Kiran, MD;  Location: [facility];  Service: Gastroenterology;  Laterality: N/A;  WILL NEED INTERPRETER ON WHEELS;  BROTHER WANTS TO TRANSLATE  ° TEE WITHOUT CARDIOVERSION N/A 07/01/2017  ° Procedure: TRANSESOPHAGEAL ECHOCARDIOGRAM (TEE);  Surgeon: Khan, Shaukat A, MD;  Location: [facility];  Service: Cardiovascular;  Laterality: N/A;  ° TEE WITHOUT CARDIOVERSION N/A 07/05/2017  ° Procedure: TRANSESOPHAGEAL ECHOCARDIOGRAM (TEE);  Surgeon: Khan, Shaukat A, MD;  Location: [facility];  Service: Cardiovascular;  Laterality: N/A;  ° TRANSURETHRAL RESECTION OF BLADDER TUMOR N/A 08/07/2021  ° Procedure: TRANSURETHRAL RESECTION OF BLADDER TUMOR (TURBT);  Surgeon: Sninsky, Brian C, MD;  Location: [facility];  Service: Urology;  Laterality: N/A;  ° URETEROSCOPY Left 08/07/2021  ° Procedure: DIAGNOSTIC URETEROSCOPY;  Surgeon: Sninsky, Brian C, MD;  Location: [facility];  Service: Urology;  Laterality: Left;  ° ° °FAMILY HISTORY: °Family History  °Problem Relation Age of Onset  ° Diabetes Brother   ° Hypertension Mother   ° Diabetes Sister   ° ° °ADVANCED DIRECTIVES (Y/N):  N ° °HEALTH MAINTENANCE: °Social History  ° °Tobacco Use  ° Smoking status: Former  °  Types: Cigarettes  ° Smokeless tobacco: Former  °  Types: Chew   °Substance Use Topics  ° Alcohol use: No  ° Drug use: No  ° ° ° Colonoscopy: ° PAP: ° Bone density: ° Lipid panel: ° °Allergies  °Allergen Reactions  ° Lorazepam Shortness Of Breath and Other (See Comments)  °  Pt experienced adverse reaction and was transferred to ICU last time they were given Med °Other reaction(s): Other (See Comments) °Pt experienced adverse reaction and was transferred to ICU last time they were given Med  ° Pork-[employer] Products Other (See Comments)  °  Cultural reasons  ° ° °Current Outpatient Medications  °Medication Sig Dispense Refill  ° amiodarone (PACERONE) 200 MG tablet Take 0.5 tablets (100 mg total) by mouth daily. Home med    ° atorvastatin (LIPITOR) 80 MG tablet Take 80 mg by mouth daily.    ° carvedilol (COREG) 3.125 MG tablet Take 1 tablet (3.125 mg total) by mouth 2 (two) times daily with a meal. 180 tablet 0  ° FARXIGA 10 MG TABS tablet Take 10 mg by mouth daily.    ° ferrous sulfate (FERROUSUL) 325 (65 FE) MG tablet Take 1 tablet (325 mg total) by mouth daily with breakfast. Take 1 tablet twice a day every other day for 90 days. 30 tablet 2  ° folic acid (FOLVITE) 1 MG tablet Take 1 mg by mouth every morning.    °   levothyroxine (SYNTHROID) 88 MCG tablet Take 88 mcg by mouth daily before breakfast.    ° pantoprazole (PROTONIX) 20 MG tablet Take 20 mg by mouth daily.    ° sacubitril-valsartan (ENTRESTO) 24-26 MG Take 1 tablet by mouth 2 (two) times daily. 180 tablet 0  ° torsemide (DEMADEX) 10 MG tablet Take 1 tablet (10 mg total) by mouth 2 (two) times daily. 60 tablet 2  ° Vericiguat 5 MG TABS Take 5 mg by mouth 2 (two) times daily.    ° °No current facility-administered medications for this visit.  ° ° °OBJECTIVE: °There were no vitals filed for this visit. °   There is no height or weight on file to calculate BMI.    ECOG FS:1 - Symptomatic but completely ambulatory ° °General: Well-developed, well-nourished, no acute distress. °Eyes: Pink conjunctiva, anicteric  sclera. °HEENT: Normocephalic, moist mucous membranes. °Lungs: No audible wheezing or coughing. °Heart: Regular rate and rhythm. °Abdomen: Soft, nontender, no obvious distention. °Musculoskeletal: No edema, cyanosis, or clubbing. °Neuro: Alert, answering all questions appropriately. Cranial nerves grossly intact. °Skin: No rashes or petechiae noted. °Psych: Normal affect. ° °LAB RESULTS: ° °Lab Results  °Component Value Date  ° NA 137 09/29/2021  ° K 3.9 09/29/2021  ° CL 95 (L) 09/29/2021  ° CO2 31 09/29/2021  ° GLUCOSE 108 (H) 09/29/2021  ° BUN 21 09/29/2021  ° CREATININE 1.57 (H) 09/29/2021  ° CALCIUM 8.5 (L) 09/29/2021  ° PROT 7.0 09/14/2021  ° ALBUMIN 3.5 09/14/2021  ° AST 34 09/14/2021  ° ALT 22 09/14/2021  ° ALKPHOS 68 09/14/2021  ° BILITOT 0.9 09/14/2021  ° GFRNONAA 45 (L) 09/29/2021  ° GFRAA >60 07/04/2017  ° ° °Lab Results  °Component Value Date  ° WBC 4.3 09/29/2021  ° NEUTROABS 6.7 09/14/2021  ° HGB 10.4 (L) 09/29/2021  ° HCT 32.3 (L) 09/29/2021  ° MCV 85.7 09/29/2021  ° PLT 127 (L) 09/29/2021  ° ° ° °STUDIES: °No results found. ° °ASSESSMENT: Pancytopenia ° °PLAN:   ° °Anemia: His most recent hemoglobin prior to admission in June 2022 was reported 10.2.  Upon admission patient's hemoglobin was 7.9 and is trended down slightly to 7.3.  At discharge, his hemoglobin has improved to 8.9 and continues to trend up to 9.3.  Given his mildly elevated total bilirubin, he may have some ongoing mild hemolysis.  Previously, schistocytes seen on peripheral smear last week possibly related to underlying DIC.  ADAMTS13 is negative essentially ruling out TTP.  Patient does not require bone marrow biopsy at this time.  Return to clinic in 4 months for repeat laboratory work and further evaluation.   °Thrombocytopenia: Resolved.  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 °Coagulopathy: Possibly underlying DIC.  Monitor. °Hematuria: Noncontrast CT did not reveal any significant pathology.  Patient is now off Eliquis. °Renal  insufficiency: Case discussed with nephrology.  Creatinine improved to 1.56.  Previously his baseline is approximately 1.4.  Follow-up with nephrology as scheduled.   °CHF exacerbation, fluid overload: Resolved.  Appreciate cardiology input. ° °Patient expressed understanding and was in agreement with this plan. He also understands that He can call clinic at any time with any questions, concerns, or complaints.  ° ° ° J , MD   11/27/2021 9:18 AM ° ° ° ° °

## 2021-12-02 ENCOUNTER — Inpatient Hospital Stay (HOSPITAL_BASED_OUTPATIENT_CLINIC_OR_DEPARTMENT_OTHER): Admitting: Oncology

## 2021-12-02 ENCOUNTER — Inpatient Hospital Stay: Attending: Oncology

## 2021-12-02 ENCOUNTER — Other Ambulatory Visit: Payer: Self-pay

## 2021-12-02 VITALS — BP 100/60 | HR 86 | Temp 97.9°F | Resp 16 | Ht 66.0 in | Wt 127.2 lb

## 2021-12-02 DIAGNOSIS — R531 Weakness: Secondary | ICD-10-CM | POA: Insufficient documentation

## 2021-12-02 DIAGNOSIS — I10 Essential (primary) hypertension: Secondary | ICD-10-CM | POA: Diagnosis not present

## 2021-12-02 DIAGNOSIS — Z87891 Personal history of nicotine dependence: Secondary | ICD-10-CM | POA: Insufficient documentation

## 2021-12-02 DIAGNOSIS — I509 Heart failure, unspecified: Secondary | ICD-10-CM | POA: Diagnosis not present

## 2021-12-02 DIAGNOSIS — E039 Hypothyroidism, unspecified: Secondary | ICD-10-CM | POA: Insufficient documentation

## 2021-12-02 DIAGNOSIS — D509 Iron deficiency anemia, unspecified: Secondary | ICD-10-CM | POA: Diagnosis not present

## 2021-12-02 DIAGNOSIS — R319 Hematuria, unspecified: Secondary | ICD-10-CM | POA: Diagnosis not present

## 2021-12-02 DIAGNOSIS — I4891 Unspecified atrial fibrillation: Secondary | ICD-10-CM | POA: Diagnosis not present

## 2021-12-02 DIAGNOSIS — E1122 Type 2 diabetes mellitus with diabetic chronic kidney disease: Secondary | ICD-10-CM | POA: Insufficient documentation

## 2021-12-02 DIAGNOSIS — I700 Atherosclerosis of aorta: Secondary | ICD-10-CM | POA: Diagnosis not present

## 2021-12-02 DIAGNOSIS — I2510 Atherosclerotic heart disease of native coronary artery without angina pectoris: Secondary | ICD-10-CM | POA: Insufficient documentation

## 2021-12-02 DIAGNOSIS — D61818 Other pancytopenia: Secondary | ICD-10-CM | POA: Diagnosis present

## 2021-12-02 DIAGNOSIS — E785 Hyperlipidemia, unspecified: Secondary | ICD-10-CM | POA: Diagnosis not present

## 2021-12-02 DIAGNOSIS — K219 Gastro-esophageal reflux disease without esophagitis: Secondary | ICD-10-CM | POA: Diagnosis not present

## 2021-12-02 DIAGNOSIS — N183 Chronic kidney disease, stage 3 unspecified: Secondary | ICD-10-CM | POA: Diagnosis not present

## 2021-12-02 DIAGNOSIS — Z79899 Other long term (current) drug therapy: Secondary | ICD-10-CM | POA: Diagnosis not present

## 2021-12-02 DIAGNOSIS — I255 Ischemic cardiomyopathy: Secondary | ICD-10-CM | POA: Diagnosis not present

## 2021-12-02 DIAGNOSIS — Z7984 Long term (current) use of oral hypoglycemic drugs: Secondary | ICD-10-CM | POA: Diagnosis not present

## 2021-12-02 DIAGNOSIS — I130 Hypertensive heart and chronic kidney disease with heart failure and stage 1 through stage 4 chronic kidney disease, or unspecified chronic kidney disease: Secondary | ICD-10-CM | POA: Insufficient documentation

## 2021-12-02 LAB — CBC
HCT: 29.4 % — ABNORMAL LOW (ref 39.0–52.0)
Hemoglobin: 9.2 g/dL — ABNORMAL LOW (ref 13.0–17.0)
MCH: 27.5 pg (ref 26.0–34.0)
MCHC: 31.3 g/dL (ref 30.0–36.0)
MCV: 87.8 fL (ref 80.0–100.0)
Platelets: 173 10*3/uL (ref 150–400)
RBC: 3.35 MIL/uL — ABNORMAL LOW (ref 4.22–5.81)
RDW: 16.6 % — ABNORMAL HIGH (ref 11.5–15.5)
WBC: 5.6 10*3/uL (ref 4.0–10.5)
nRBC: 0 % (ref 0.0–0.2)

## 2021-12-02 LAB — IRON AND TIBC
Iron: 64 ug/dL (ref 45–182)
Saturation Ratios: 29 % (ref 17.9–39.5)
TIBC: 221 ug/dL — ABNORMAL LOW (ref 250–450)
UIBC: 157 ug/dL

## 2021-12-02 LAB — FERRITIN: Ferritin: 330 ng/mL (ref 24–336)

## 2021-12-02 LAB — COMPREHENSIVE METABOLIC PANEL
ALT: 20 U/L (ref 0–44)
AST: 27 U/L (ref 15–41)
Albumin: 2.9 g/dL — ABNORMAL LOW (ref 3.5–5.0)
Alkaline Phosphatase: 94 U/L (ref 38–126)
Anion gap: 5 (ref 5–15)
BUN: 17 mg/dL (ref 8–23)
CO2: 28 mmol/L (ref 22–32)
Calcium: 8.1 mg/dL — ABNORMAL LOW (ref 8.9–10.3)
Chloride: 99 mmol/L (ref 98–111)
Creatinine, Ser: 1.43 mg/dL — ABNORMAL HIGH (ref 0.61–1.24)
GFR, Estimated: 50 mL/min — ABNORMAL LOW (ref >60)
Glucose, Bld: 223 mg/dL — ABNORMAL HIGH (ref 70–99)
Potassium: 3.4 mmol/L — ABNORMAL LOW (ref 3.5–5.1)
Sodium: 132 mmol/L — ABNORMAL LOW (ref 135–145)
Total Bilirubin: 0.5 mg/dL (ref 0.3–1.2)
Total Protein: 6.6 g/dL (ref 6.5–8.1)

## 2021-12-02 LAB — LACTATE DEHYDROGENASE: LDH: 244 U/L — ABNORMAL HIGH (ref 98–192)

## 2021-12-02 NOTE — Progress Notes (Signed)
Pt reports black stools but does take iron tablets. ?

## 2022-04-06 ENCOUNTER — Inpatient Hospital Stay (HOSPITAL_BASED_OUTPATIENT_CLINIC_OR_DEPARTMENT_OTHER): Payer: Medicare Other | Admitting: Oncology

## 2022-04-06 ENCOUNTER — Ambulatory Visit: Payer: No Typology Code available for payment source | Admitting: Oncology

## 2022-04-06 ENCOUNTER — Other Ambulatory Visit: Payer: No Typology Code available for payment source

## 2022-04-06 ENCOUNTER — Encounter: Payer: Self-pay | Admitting: Oncology

## 2022-04-06 ENCOUNTER — Other Ambulatory Visit: Payer: Self-pay | Admitting: *Deleted

## 2022-04-06 ENCOUNTER — Inpatient Hospital Stay: Payer: Medicare Other | Attending: Oncology

## 2022-04-06 VITALS — BP 95/54 | HR 65 | Resp 18 | Ht 66.0 in | Wt 132.0 lb

## 2022-04-06 DIAGNOSIS — Z79890 Hormone replacement therapy (postmenopausal): Secondary | ICD-10-CM | POA: Insufficient documentation

## 2022-04-06 DIAGNOSIS — E039 Hypothyroidism, unspecified: Secondary | ICD-10-CM | POA: Insufficient documentation

## 2022-04-06 DIAGNOSIS — E1122 Type 2 diabetes mellitus with diabetic chronic kidney disease: Secondary | ICD-10-CM | POA: Insufficient documentation

## 2022-04-06 DIAGNOSIS — I2510 Atherosclerotic heart disease of native coronary artery without angina pectoris: Secondary | ICD-10-CM | POA: Insufficient documentation

## 2022-04-06 DIAGNOSIS — I4891 Unspecified atrial fibrillation: Secondary | ICD-10-CM | POA: Insufficient documentation

## 2022-04-06 DIAGNOSIS — I130 Hypertensive heart and chronic kidney disease with heart failure and stage 1 through stage 4 chronic kidney disease, or unspecified chronic kidney disease: Secondary | ICD-10-CM | POA: Diagnosis not present

## 2022-04-06 DIAGNOSIS — N183 Chronic kidney disease, stage 3 unspecified: Secondary | ICD-10-CM | POA: Insufficient documentation

## 2022-04-06 DIAGNOSIS — K219 Gastro-esophageal reflux disease without esophagitis: Secondary | ICD-10-CM | POA: Diagnosis not present

## 2022-04-06 DIAGNOSIS — D509 Iron deficiency anemia, unspecified: Secondary | ICD-10-CM

## 2022-04-06 DIAGNOSIS — I255 Ischemic cardiomyopathy: Secondary | ICD-10-CM | POA: Diagnosis not present

## 2022-04-06 DIAGNOSIS — D61818 Other pancytopenia: Secondary | ICD-10-CM | POA: Diagnosis not present

## 2022-04-06 DIAGNOSIS — Z87891 Personal history of nicotine dependence: Secondary | ICD-10-CM | POA: Diagnosis not present

## 2022-04-06 DIAGNOSIS — Z7984 Long term (current) use of oral hypoglycemic drugs: Secondary | ICD-10-CM | POA: Diagnosis not present

## 2022-04-06 DIAGNOSIS — I509 Heart failure, unspecified: Secondary | ICD-10-CM | POA: Insufficient documentation

## 2022-04-06 DIAGNOSIS — R7402 Elevation of levels of lactic acid dehydrogenase (LDH): Secondary | ICD-10-CM | POA: Insufficient documentation

## 2022-04-06 DIAGNOSIS — Z8673 Personal history of transient ischemic attack (TIA), and cerebral infarction without residual deficits: Secondary | ICD-10-CM | POA: Diagnosis not present

## 2022-04-06 DIAGNOSIS — E785 Hyperlipidemia, unspecified: Secondary | ICD-10-CM | POA: Insufficient documentation

## 2022-04-06 DIAGNOSIS — Z79899 Other long term (current) drug therapy: Secondary | ICD-10-CM | POA: Diagnosis not present

## 2022-04-06 DIAGNOSIS — D696 Thrombocytopenia, unspecified: Secondary | ICD-10-CM

## 2022-04-06 LAB — CBC WITH DIFFERENTIAL/PLATELET
Abs Immature Granulocytes: 0.01 10*3/uL (ref 0.00–0.07)
Basophils Absolute: 0 10*3/uL (ref 0.0–0.1)
Basophils Relative: 1 %
Eosinophils Absolute: 0.1 10*3/uL (ref 0.0–0.5)
Eosinophils Relative: 2 %
HCT: 30.1 % — ABNORMAL LOW (ref 39.0–52.0)
Hemoglobin: 10.2 g/dL — ABNORMAL LOW (ref 13.0–17.0)
Immature Granulocytes: 0 %
Lymphocytes Relative: 30 %
Lymphs Abs: 1.5 10*3/uL (ref 0.7–4.0)
MCH: 29.7 pg (ref 26.0–34.0)
MCHC: 33.9 g/dL (ref 30.0–36.0)
MCV: 87.5 fL (ref 80.0–100.0)
Monocytes Absolute: 0.4 10*3/uL (ref 0.1–1.0)
Monocytes Relative: 9 %
Neutro Abs: 2.9 10*3/uL (ref 1.7–7.7)
Neutrophils Relative %: 58 %
Platelets: 114 10*3/uL — ABNORMAL LOW (ref 150–400)
RBC: 3.44 MIL/uL — ABNORMAL LOW (ref 4.22–5.81)
RDW: 14.5 % (ref 11.5–15.5)
WBC: 4.9 10*3/uL (ref 4.0–10.5)
nRBC: 0 % (ref 0.0–0.2)

## 2022-04-06 LAB — FERRITIN: Ferritin: 112 ng/mL (ref 24–336)

## 2022-04-06 LAB — IRON AND TIBC
Iron: 78 ug/dL (ref 45–182)
Saturation Ratios: 27 % (ref 17.9–39.5)
TIBC: 288 ug/dL (ref 250–450)
UIBC: 210 ug/dL

## 2022-04-06 NOTE — Progress Notes (Signed)
[place]  [phone]) [phone] Fax:(336) [phone]  ID: Walter Blair OB: 21-Aug-1946  MR#: [id number]  PZW#:[id number]  Patient Care Team: Jodi Marble, MD as PCP - General (Internal Medicine)  CHIEF COMPLAINT: Pancytopenia.  INTERVAL HISTORY: Patient returns to clinic today for repeat laboratory can routine evaluation.  He is accompanied by his brother in clinic today.  Denies any recent hospitalization.  Hospitalized in January for CHF exacerbation.  He also has history of atrial fibrillation and CAD.  Brother states he is getting stronger.  [facility] sees him each week for help with ADLs.  Reports fatigue with exertion.  Eating and drinking well.  Gaining weight.  No recent infections.  Denies any signs of bleeding.  No new concerns.  REVIEW OF SYSTEMS:   Review of Systems  Constitutional:  Positive for malaise/fatigue.  Neurological:  Positive for weakness.    As per HPI. Otherwise, a complete review of systems is negative.  PAST MEDICAL HISTORY: Past Medical History:  Diagnosis Date   AICD (automatic cardioverter/defibrillator) present    Aortic atherosclerosis ([place])    Atrial fibrillation ([place])    a.) CHA2DS2-VASc = 7 (age x 2, CHF, CVA x 2, aortic plaque, T2DM). b.) rate/rhythm maintained on oral amiodarone; chronically anticoagulated on rivaroxaban. c.) s/p DCCV 07/01/2017 and 07/05/2017   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   a.) cystoscopy 08/05/2021 --> ~2-3 cm papillary tumor at the LEFT bladder base   CAD (coronary artery disease)    CHF (congestive heart failure) (HCC)    CKD (chronic kidney disease), stage III (HCC)    Dyspnea    GERD (gastroesophageal reflux disease)    HFrEF (heart failure with reduced ejection fraction) ([place])    a.) TTE 06/29/2017: EF 10-15%; diffuse HK; mild AR, severe MR/TR, mod PR; BAE. b.) TTE 07/25/2021: EF 20-25%; global HK; G3DD; severely reduced RV function; Severe BAR; mod-severe MR, severe TR, mild AR.   HLD  (hyperlipidemia)    HTN (hypertension)    Hypothyroidism    IDA (iron deficiency anemia)    Ischemic cardiomyopathy    a.) TTE 06/29/2017: EF 10-15%. b.) TTE 07/25/2021: ED 20-25%.   Left-sided cerebrovascular accident (CVA) ([place]) 2004   a.) residual weakness in LEFT hand   Long term current use of anticoagulant    a.) rivaroxaban   S/P CABG x 4 2003   Performed in [place]   T2DM (type 2 diabetes mellitus) ([place])    Thrombocytopenia ([place])    possibly associated with underlying DIC    PAST SURGICAL HISTORY: Past Surgical History:  Procedure Laterality Date   BLADDER INSTILLATION N/A 08/07/2021   Procedure: BLADDER INSTILLATION OF GEMCITABINE;  Surgeon: Billey Co, MD;  Location: [facility] ORS;  Service: Urology;  Laterality: N/A;   CARDIAC DEFIBRILLATOR PLACEMENT     CARDIOVERSION N/A 07/01/2017   Procedure: CARDIOVERSION;  Surgeon: Dionisio David, MD;  Location: [facility] ORS;  Service: Cardiovascular;  Laterality: N/A;   CARDIOVERSION N/A 07/05/2017   Procedure: CARDIOVERSION;  Surgeon: Dionisio David, MD;  Location: [facility] ORS;  Service: Cardiovascular;  Laterality: N/A;   COLONOSCOPY WITH PROPOFOL N/A 12/30/2020   Procedure: COLONOSCOPY WITH PROPOFOL;  Surgeon: [name], MD;  Location: [facility] ENDOSCOPY;  Service: Gastroenterology;  Laterality: N/A;   CORONARY ARTERY BYPASS GRAFT N/A 2003   Procedure: Four-vessel CABG performed in [place] Bilateral 08/07/2021   Procedure: CYSTOSCOPY WITH RETROGRADE PYELOGRAM;  Surgeon: Billey Co, MD;  Location: [facility] ORS;  Service:  Urology;  Laterality: Bilateral;   ESOPHAGOGASTRODUODENOSCOPY  12/30/2020   Procedure: ESOPHAGOGASTRODUODENOSCOPY (EGD);  Surgeon: [name], MD;  Location: [facility] ENDOSCOPY;  Service: Gastroenterology;;   ESOPHAGOGASTRODUODENOSCOPY N/A 09/17/2021   Procedure: ESOPHAGOGASTRODUODENOSCOPY (EGD);  Surgeon: Virgel Manifold, MD;  Location[facility] ENDOSCOPY;  Service: Endoscopy;   Laterality: N/A;   GIVENS CAPSULE STUDY N/A 02/02/2021   Procedure: GIVENS CAPSULE STUDY;  Surgeon: [name], MD;  Location: [facility] ENDOSCOPY;  Service: Gastroenterology;  Laterality: N/A;  WILL NEED INTERPRETER ON WHEELS;  BROTHER WANTS TO TRANSLATE   TEE WITHOUT CARDIOVERSION N/A 07/01/2017   Procedure: TRANSESOPHAGEAL ECHOCARDIOGRAM (TEE);  Surgeon: Dionisio David, MD;  Location: [facility] ORS;  Service: Cardiovascular;  Laterality: N/A;   TEE WITHOUT CARDIOVERSION N/A 07/05/2017   Procedure: TRANSESOPHAGEAL ECHOCARDIOGRAM (TEE);  Surgeon: Dionisio David, MD;  Location: [facility] ORS;  Service: Cardiovascular;  Laterality: N/A;   TRANSURETHRAL RESECTION OF BLADDER TUMOR N/A 08/07/2021   Procedure: TRANSURETHRAL RESECTION OF BLADDER TUMOR (TURBT);  Surgeon: Billey Co, MD;  Location: [facility] ORS;  Service: Urology;  Laterality: N/A;   URETEROSCOPY Left 08/07/2021   Procedure: DIAGNOSTIC URETEROSCOPY;  Surgeon: Billey Co, MD;  Location: [facility] ORS;  Service: Urology;  Laterality: Left;    FAMILY HISTORY: Family History  Problem Relation Age of Onset   Diabetes Brother    Hypertension Mother    Diabetes Sister     ADVANCED DIRECTIVES (Y/N):  N  HEALTH MAINTENANCE: Social History   Tobacco Use   Smoking status: Former    Types: Cigarettes   Smokeless tobacco: Former    Types: Chew  Substance Use Topics   Alcohol use: No   Drug use: No     Colonoscopy:  PAP:  Bone density:  Lipid panel:  Allergies  Allergen Reactions   Lorazepam Shortness Of Breath and Other (See Comments)    Pt experienced adverse reaction and was transferred to ICU last time they were given Med Other reaction(s): Other (See Comments) Pt experienced adverse reaction and was transferred to ICU last time they were given Med   Pork-Derived Products Other (See Comments)    Cultural reasons    Current Outpatient Medications  Medication Sig Dispense Refill   amiodarone (PACERONE) 200 MG tablet Take 0.5  tablets (100 mg total) by mouth daily. Home med     atorvastatin (LIPITOR) 80 MG tablet Take 80 mg by mouth daily.     folic acid (FOLVITE) 1 MG tablet Take 1 mg by mouth every morning.     Levothyroxine Sodium 88 MCG CAPS Take 88 mcg by mouth daily.     losartan (COZAAR) 25 MG tablet Take 25 mg by mouth daily.     meclizine (ANTIVERT) 25 MG tablet Take 25 mg by mouth 3 (three) times daily as needed.     metFORMIN (GLUCOPHAGE-XR) 500 MG 24 hr tablet Take 500 mg by mouth daily.     pantoprazole (PROTONIX) 20 MG tablet Take 20 mg by mouth daily.     carvedilol (COREG) 3.125 MG tablet Take 1 tablet (3.125 mg total) by mouth 2 (two) times daily with a meal. 180 tablet 0   FARXIGA 10 MG TABS tablet Take 10 mg by mouth daily. (Patient not taking: Reported on 04/06/2022)     ferrous sulfate (FERROUSUL) 325 (65 FE) MG tablet Take 1 tablet (325 mg total) by mouth daily with breakfast. Take 1 tablet twice a day every other day for 90 days. 30 tablet 2   levothyroxine (SYNTHROID) 88  MCG tablet Take 88 mcg by mouth daily before breakfast. (Patient not taking: Reported on 04/06/2022)     torsemide (DEMADEX) 10 MG tablet Take 1 tablet (10 mg total) by mouth 2 (two) times daily. 60 tablet 2   Vericiguat 5 MG TABS Take 5 mg by mouth 2 (two) times daily. (Patient not taking: Reported on 04/06/2022)     No current facility-administered medications for this visit.    OBJECTIVE: Vitals:   04/06/22 0959  BP: (!) 95/54  Pulse: 65  Resp: 18     Body mass index is 21.31 kg/m.    ECOG FS:1 - Symptomatic but completely ambulatory  Physical Exam Constitutional:      Appearance: Normal appearance.  HENT:     Head: Normocephalic and atraumatic.  Eyes:     Pupils: Pupils are equal, round, and reactive to light.  Cardiovascular:     Rate and Rhythm: Normal rate and regular rhythm.     Heart sounds: Normal heart sounds. No murmur heard. Pulmonary:     Effort: Pulmonary effort is normal.     Breath sounds: Normal  breath sounds. No wheezing.  Abdominal:     General: Bowel sounds are normal. There is no distension.     Palpations: Abdomen is soft.     Tenderness: There is no abdominal tenderness.  Musculoskeletal:        General: Normal range of motion.     Cervical back: Normal range of motion.  Skin:    General: Skin is warm and dry.     Findings: No rash.  Neurological:     Mental Status: He is alert and oriented to person, place, and time.     Gait: Gait is intact.  Psychiatric:        Mood and Affect: Mood and affect normal.        Cognition and Memory: Memory normal.        Judgment: Judgment normal.     LAB RESULTS:  Lab Results  Component Value Date   NA 132 (L) 12/02/2021   K 3.4 (L) 12/02/2021   CL 99 12/02/2021   CO2 28 12/02/2021   GLUCOSE 223 (H) 12/02/2021   BUN 17 12/02/2021   CREATININE 1.43 (H) 12/02/2021   CALCIUM 8.1 (L) 12/02/2021   PROT 6.6 12/02/2021   ALBUMIN 2.9 (L) 12/02/2021   AST 27 12/02/2021   ALT 20 12/02/2021   ALKPHOS 94 12/02/2021   BILITOT 0.5 12/02/2021   GFRNONAA 50 (L) 12/02/2021   GFRAA >60 07/04/2017    Lab Results  Component Value Date   WBC 4.9 04/06/2022   NEUTROABS 2.9 04/06/2022   HGB 10.2 (L) 04/06/2022   HCT 30.1 (L) 04/06/2022   MCV 87.5 04/06/2022   PLT 114 (L) 04/06/2022     STUDIES: No results found.  ASSESSMENT: Pancytopenia  PLAN:    Anemia-  Appears to be improving.  Hemoglobin today is 10.2 previously 9.2.  Previously ruled out TTP.  Mildly elevated LDH possibly indicating low-grade hemolysis.  Bilirubin is now within limits.  Peripheral smear showed schistocytes likely indicating DIC.  No bone marrow at this time.  Previously having hematuria which has resolved which was likely contributing.  Iron labs are pending.  Thrombocytopenia-platelet count dropped slightly today and is 114,000 previously 173,000.  Differential is normal.  CHF-stable.  Continue meds.  Disposition- RTC in 4 months for f/u and labs.    I spent 20 minutes dedicated to the care of this patient (  face-to-face and non-face-to-face) on the date of the encounter to include what is described in the assessment and plan.  Patient expressed understanding and was in agreement with this plan. He also understands that He can call clinic at any time with any questions, concerns, or complaints.    Jacquelin Hawking, NP   04/06/2022 10:04 AM

## 2022-06-10 ENCOUNTER — Ambulatory Visit: Payer: No Typology Code available for payment source | Admitting: Urology

## 2022-06-29 ENCOUNTER — Telehealth: Payer: Self-pay | Admitting: *Deleted

## 2022-06-29 ENCOUNTER — Encounter: Payer: Self-pay | Admitting: *Deleted

## 2022-06-29 NOTE — Patient Outreach (Signed)
  Care Coordination   Initial Visit Note   06/29/2022 Name: Walter Blair MRN: [id number] DOB: 03-25-1946  Walter Blair is a 76 y.o. year old male who sees Jodi Marble, MD for primary care. I spoke with  Gurney Wavra by phone today.  What matters to the patients health and wellness today?  "no current needs, on hospice services"  RN care manager verified with patient and his brother that pt is active with [facility]   SDOH assessments and interventions completed:  No     Care Coordination Interventions Activated:  No  Care Coordination Interventions:  No, not indicated   Follow up plan: No further intervention required.   Encounter Outcome:  Pt. Visit Completed   Jacqlyn [facility], BSN [facility] RN Care Coordinator [phone]

## 2022-06-30 ENCOUNTER — Ambulatory Visit (INDEPENDENT_AMBULATORY_CARE_PROVIDER_SITE_OTHER): Payer: Medicare Other | Admitting: Urology

## 2022-06-30 VITALS — BP 114/64 | HR 77 | Ht 66.0 in | Wt 130.0 lb

## 2022-06-30 DIAGNOSIS — R310 Gross hematuria: Secondary | ICD-10-CM | POA: Diagnosis not present

## 2022-06-30 DIAGNOSIS — C679 Malignant neoplasm of bladder, unspecified: Secondary | ICD-10-CM | POA: Diagnosis not present

## 2022-06-30 NOTE — Progress Notes (Signed)
   06/30/2022 2:38 PM   Walter Blair Oct 22, 1945 [id number]  Reason for visit: Follow up bladder cancer  HPI: 76 year old comorbid male here with his brother today who provides the history.  Briefly he presented with gross hematuria and found to have a 3.5 cm bladder tumor and underwent a TURBT with gemcitabine on 08/07/2021, and pathology showed HG Ta disease.  All visible tumor was removed at that time.  With his comorbidities they opted to defer a second look TURBT and pursue induction BCG.  He never got induction BCG as he had multiple hospital admissions for CHF exacerbations as well as a GI bleed, and has now been transitioned to hospice care.  With his comorbidities and declining health he opted to defer cystoscopy at our last visit in February 2023.  He denies any gross hematuria or urinary symptoms.  They are wondering what the next steps are at this point.  We reviewed the AUA guidelines that recommend surveillance cystoscopy in patients with a history of bladder cancer every 3 to 4 months for the first 2 years, but that in patients with other comorbidities, sometimes a less invasive approach is reasonable.  We discussed options including urinalysis to evaluate for microscopic hematuria, cystoscopy, or further imaging if they opt to defer cystoscopy.  Risk and benefits were discussed at length.  Urinalysis today is completely benign with no microscopic hematuria.  Using shared decision making, they opted for repeat urinalysis in 6 months  RTC 53moUA, sooner if gross hematuria   [name] MD  BSt. Mary's[address] [place] [phone]

## 2022-07-01 ENCOUNTER — Telehealth: Payer: Self-pay

## 2022-07-01 LAB — URINALYSIS, COMPLETE
Bilirubin, UA: NEGATIVE
Glucose, UA: NEGATIVE
Ketones, UA: NEGATIVE
Leukocytes,UA: NEGATIVE
Nitrite, UA: NEGATIVE
Protein,UA: NEGATIVE
RBC, UA: NEGATIVE
Specific Gravity, UA: <1.005 — ABNORMAL LOW (ref 1.005–1.030)
Urobilinogen, Ur: 0.2 mg/dL (ref 0.2–1.0)
pH, UA: 5.5 (ref 5.0–7.5)

## 2022-07-01 LAB — MICROSCOPIC EXAMINATION: Bacteria, UA: NONE SEEN

## 2022-07-01 NOTE — Telephone Encounter (Signed)
-----   Message from Billey Co, MD sent at 07/01/2022  8:04 AM EDT ----- Please contact his brother [place].  Good news, urinalysis completely normal with no microscopic blood.  Would recommend follow-up for urinalysis in 6 months  Nickolas Madrid, MD 07/01/2022

## 2022-07-01 NOTE — Telephone Encounter (Signed)
I left a message for the patient to return my call.

## 2022-08-05 ENCOUNTER — Ambulatory Visit: Payer: Medicare Other | Admitting: Oncology

## 2022-08-05 ENCOUNTER — Other Ambulatory Visit: Payer: Medicare Other

## 2022-11-16 ENCOUNTER — Ambulatory Visit (INDEPENDENT_AMBULATORY_CARE_PROVIDER_SITE_OTHER): Payer: Medicare Other | Admitting: Cardiovascular Disease

## 2022-11-16 ENCOUNTER — Encounter: Payer: Self-pay | Admitting: Cardiovascular Disease

## 2022-11-16 VITALS — BP 90/45 | HR 63 | Ht 66.0 in | Wt 136.4 lb

## 2022-11-16 DIAGNOSIS — I255 Ischemic cardiomyopathy: Secondary | ICD-10-CM

## 2022-11-16 DIAGNOSIS — I5022 Chronic systolic (congestive) heart failure: Secondary | ICD-10-CM | POA: Diagnosis not present

## 2022-11-16 DIAGNOSIS — I2510 Atherosclerotic heart disease of native coronary artery without angina pectoris: Secondary | ICD-10-CM | POA: Diagnosis not present

## 2022-11-16 DIAGNOSIS — I480 Paroxysmal atrial fibrillation: Secondary | ICD-10-CM

## 2022-11-16 DIAGNOSIS — Z951 Presence of aortocoronary bypass graft: Secondary | ICD-10-CM

## 2022-11-16 DIAGNOSIS — E782 Mixed hyperlipidemia: Secondary | ICD-10-CM

## 2022-11-16 DIAGNOSIS — I482 Chronic atrial fibrillation, unspecified: Secondary | ICD-10-CM

## 2022-11-16 DIAGNOSIS — I10 Essential (primary) hypertension: Secondary | ICD-10-CM

## 2022-11-16 DIAGNOSIS — Z95810 Presence of automatic (implantable) cardiac defibrillator: Secondary | ICD-10-CM

## 2022-11-16 MED ORDER — AMIODARONE HCL 200 MG PO TABS: 100.0000 mg | ORAL_TABLET | Freq: Every day | ORAL | 2 refills | Status: DC

## 2022-11-16 NOTE — Assessment & Plan Note (Signed)
Hypotensive, denies dizziness.

## 2022-11-16 NOTE — Assessment & Plan Note (Signed)
Denies chest pain 

## 2022-11-16 NOTE — Progress Notes (Signed)
Cardiology Office Note   Date:  11/16/2022   ID:  Walter Blair, Walter Blair 29-Mar-1946, MRN [id number]  PCP:  Jodi Marble, MD  Cardiologist:  Neoma Laming, MD      History of Present Illness: Walter Blair is a 77 y.o. male who presents for  Chief Complaint  Patient presents with   Follow-up    3 month fu    Patient in office for routine cardiac exam. Denies chest pain, shortness of breath, edema, palpitations.      Past Medical History:  Diagnosis Date   AICD (automatic cardioverter/defibrillator) present    Aortic atherosclerosis ([place])    Atrial fibrillation (HCC)    a.) CHA2DS2-VASc = 7 (age x 2, CHF, CVA x 2, aortic plaque, T2DM). b.) rate/rhythm maintained on oral amiodarone; chronically anticoagulated on rivaroxaban. 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 b.) TTE 07/25/2021: EF 20-25%; global HK; G3DD; severely reduced RV function; Severe BAR; mod-severe MR, severe TR, mild AR.   HLD (hyperlipidemia)    HTN (hypertension)    Hypothyroidism    IDA (iron deficiency anemia)    Ischemic cardiomyopathy    a.) TTE 06/29/2017: EF 10-15%. b.) TTE 07/25/2021: ED 20-25%.   Left-sided cerebrovascular accident (CVA) ([place]) 2004   a.) residual weakness in LEFT hand   Long term current use of anticoagulant    a.) rivaroxaban   S/P CABG x 4 2003   Performed in [place]   T2DM (type 2 diabetes mellitus) ([place])    Thrombocytopenia ([place])    possibly associated with underlying DIC     Past Surgical History:  Procedure Laterality Date   BLADDER  INSTILLATION N/A 08/07/2021   Procedure: BLADDER INSTILLATION OF GEMCITABINE;  Surgeon: Billey Co, MD;  Location: [facility] ORS;  Service: Urology;  Laterality: N/A;   CARDIAC DEFIBRILLATOR PLACEMENT     CARDIOVERSION N/A 07/01/2017   Procedure: CARDIOVERSION;  Surgeon: Dionisio David, MD;  Location: [facility] OR[facility];  Service: Cardiovascular;  Laterality: N/A;   CARDIOVERSION N/A 07/05/2017   Procedure: CARDIOVERSION;  Surgeon: Dionisio David, MD;  Location: [facility] ORS;  Service: Cardiovascular;  Laterality: N/A;   COLONOSCOPY WITH PROPOFOL N/A 12/30/2020   Procedure: COLONOSCOPY WITH PROPOFOL;  Surgeon: [name], MD;  Location: [facility] ENDOSCOPY;  Service: Gastroenterology;  Laterality: N/A;   CORONARY ARTERY BYPASS GRAFT N/A 2003   Procedure: Four-vessel CABG performed in [place] Bilateral 08/07/2021   Procedure: CYSTOSCOPY WITH RETROGRADE PYELOGRAM;  Surgeon: Billey Co, MD;  Location: [facility] ORS;  Service: Urology;  Laterality: Bilateral;   ESOPHAGOGASTRODUODENOSCOPY  12/30/2020   Procedure: ESOPHAGOGASTRODUODENOSCOPY (EGD);  Surgeon: [name], MD;  Location: [facility] ENDOSCOPY;  Service: Gastroenterology;;   ESOPHAGOGASTRODUODENOSCOPY N/A 09/17/2021   Procedure: ESOPHAGOGASTRODUODENOSCOPY (EGD);  Surgeon: Virgel Manifold, MD;  Location: [facility] ENDOSCOPY;  Service: Endoscopy;  Laterality: N/A;   GIVENS CAPSULE STUDY N/A 02/02/2021   Procedure: GIVENS CAPSULE STUDY;  Surgeon: [name], MD;  Location: [facility] ENDOSCOPY;  Service: Gastroenterology;  Laterality:  N/A;  WILL NEED INTERPRETER ON WHEELS;  BROTHER WANTS TO TRANSLATE   TEE WITHOUT CARDIOVERSION N/A 07/01/2017   Procedure: TRANSESOPHAGEAL ECHOCARDIOGRAM (TEE);  Surgeon: Dionisio David, MD;  Location: [facility] ORS;  Service: Cardiovascular;  Laterality: N/A;   TEE WITHOUT CARDIOVERSION N/A 07/05/2017   Procedure: TRANSESOPHAGEAL ECHOCARDIOGRAM (TEE);  Surgeon: Dionisio David, MD;  Location: [facility] ORS;   Service: Cardiovascular;  Laterality: N/A;   TRANSURETHRAL RESECTION OF BLADDER TUMOR N/A 08/07/2021   Procedure: TRANSURETHRAL RESECTION OF BLADDER TUMOR (TURBT);  Surgeon: Billey Co, MD;  Location: [facility] ORS;  Service: Urology;  Laterality: N/A;   URETEROSCOPY Left 08/07/2021   Procedure: DIAGNOSTIC URETEROSCOPY;  Surgeon: Billey Co, MD;  Location: [facility] ORS;  Service: Urology;  Laterality: Left;     Current Outpatient Medications  Medication Sig Dispense Refill   atorvastatin (LIPITOR) 80 MG tablet Take 80 mg by mouth daily.     folic acid (FOLVITE) 1 MG tablet Take 1 mg by mouth every morning.     levothyroxine (SYNTHROID) 88 MCG tablet Take 88 mcg by mouth daily before breakfast.     Levothyroxine Sodium 88 MCG CAPS Take 88 mcg by mouth daily.     losartan (COZAAR) 25 MG tablet Take 25 mg by mouth daily.     meclizine (ANTIVERT) 25 MG tablet Take 25 mg by mouth 3 (three) times daily as needed.     metFORMIN (GLUCOPHAGE-XR) 500 MG 24 hr tablet Take 500 mg by mouth daily.     pantoprazole (PROTONIX) 20 MG tablet Take 20 mg by mouth daily.     amiodarone (PACERONE) 200 MG tablet Take 0.5 tablets (100 mg total) by mouth daily. Home med 45 tablet 2   carvedilol (COREG) 3.125 MG tablet Take 1 tablet (3.125 mg total) by mouth 2 (two) times daily with a meal. 180 tablet 0   ferrous sulfate (FERROUSUL) 325 (65 FE) MG tablet Take 1 tablet (325 mg total) by mouth daily with breakfast. Take 1 tablet twice a day every other day for 90 days. 30 tablet 2   torsemide (DEMADEX) 10 MG tablet Take 1 tablet (10 mg total) by mouth 2 (two) times daily. 60 tablet 2   No current facility-administered medications for this visit.    Allergies:   Lorazepam and Pork-derived products    Social History:   reports that he has quit smoking. His smoking use included cigarettes. He has quit using smokeless tobacco.  His smokeless tobacco use included chew. He reports that he does not drink alcohol and  does not use drugs.   Family History:  family history includes Diabetes in his brother and sister; Hypertension in his mother.    ROS:     Review of Systems  Constitutional: Negative.   HENT: Negative.    Eyes: Negative.   Respiratory: Negative.    Gastrointestinal: Negative.   Genitourinary: Negative.   Musculoskeletal: Negative.   Skin: Negative.   Neurological: Negative.   Endo/Heme/Allergies: Negative.   Psychiatric/Behavioral: Negative.    All other systems reviewed and are negative.     All other systems are reviewed and negative.    PHYSICAL EXAM: VS:  BP (!) 90/45   Pulse 63   Ht '5\' 6"'$  (1.676 m)   Wt 136 lb 6.4 oz (61.9 kg)   SpO2 92%   BMI 22.02 kg/m  , BMI Body mass index is 22.02 kg/m. Last weight:  Wt Readings from Last 3 Encounters:  11/16/22 136 lb 6.4 oz (  61.9 kg)  06/30/22 130 lb (59 kg)  04/06/22 132 lb (59.9 kg)     Physical Exam Vitals reviewed.  Constitutional:      Appearance: Normal appearance. He is normal weight.  HENT:     Head: Normocephalic.     Nose: Nose normal.     Mouth/Throat:     Mouth: Mucous membranes are moist.  Eyes:     Pupils: Pupils are equal, round, and reactive to light.  Cardiovascular:     Rate and Rhythm: Normal rate and regular rhythm.     Pulses: Normal pulses.     Heart sounds: Normal heart sounds.  Pulmonary:     Effort: Pulmonary effort is normal.  Abdominal:     General: Abdomen is flat. Bowel sounds are normal.  Musculoskeletal:        General: Normal range of motion.     Cervical back: Normal range of motion.  Skin:    General: Skin is warm.  Neurological:     General: No focal deficit present.     Mental Status: He is alert.  Psychiatric:        Mood and Affect: Mood normal.      EKG: none today  Recent Labs: 12/02/2021: ALT 20; BUN 17; Creatinine, Ser 1.43; Potassium 3.4; Sodium 132 04/06/2022: Hemoglobin 10.2; Platelets 114    Lipid Panel No results found for: "CHOL", "TRIG", "HDL",  "CHOLHDL", "VLDL", "[place]", "LDLDIRECT"    Other studies Reviewed: Patient: [id number] Jhair Feick DOB:  Jul 07, 1946  Date:  07/01/2021 08:00 Provider: Neoma Laming MD Encounter: NUCLEAR STRESS TEST   Page 1 TESTS    [facility] [address] [place], [place] [address] [phone] STUDY:  Gated Stress / Rest Myocardial Perfusion Imaging Tomographic (SPECT) Including attenuation correction Wall Motion, Left Ventricular Ejection Fraction By Gated Technique.Persantine Stress Test. SEX: Male   WEIGHT: 135 lbs  HEIGHT: 66 in        ARMS UP: YES/NO                                                                                                                                                                                REFERRING PHYSICIAN: Dr.Azayla Polo Humphrey Rolls  INDICATION FOR STUDY: Heart Failure                                                                                                                                                                                                                     TECHNIQUE:  Approximately 20 minutes following the intravenous administration of 10.1 mCi of Tc-80mSestamibi after stress testing in a reclined supine position with arms above their head if able to do so, gated SPECT imaging of the heart was performed. After about a 2hr break, the patient was injected intravenously with 32.1 mCi of Tc-972mestamibi.  Approximately 45 minutes later in the same position as stress imaging SPECT rest imaging of the heart was performed.  STRESS BY:  ShNeoma LamingMD PROTOCOL:  Persantine   DOSE ADMIN: 7.0 cc     ROUTE OF ADMINISTRATION: IV                                                                            MAX PRED HR: 144                      85%: 122               75%: 108                                                                                                                   RESTING BP: 120/72   RESTING HR: 62  PEAK BP: 112/64   PEAK HR: 62  EXERCISE DURATION:    4 min injection                                            REASON FOR TEST TERMINATION:    Protocol end                                                                                                                              SYMPTOMS:   None                                                                                                                                                                                                          EKG RESULTS: VVI paced rhythm 62/min. No significant ST changes with persantine.                                                              IMAGE QUALITY: Good  PERFUSION/WALL MOTION FINDINGS: EF = 33%. Large severe fixed basal anteroseptal, basal inferoseptal, basal inferior, mid anteroseptal, mid inferoseptal, and mid inferior wall defects, global hypokinesis.                                                                           IMPRESSION:  Infarction in the LAD/RCA territories with no evidence of ischemia, dilated cardiomyopathy, severe LV dysfunction.                                                                                                                                                                                                                                                                                         Neoma Laming, MD Stress Interpreting Physician / Nuclear Interpreting Physician         Neoma Laming MD  Electronically signed by: Neoma Laming     Date: 07/03/2021 11:02  Patient: [id number] - Kahner Basista DOB:  [birth year]  Date:  06/29/2021 09:45 Provider: Neoma Laming MD Encounter: ECHO   Page 2 REASON FOR VISIT  Visit for: Echocardiogram/Heart Failure   Sex:   Male          wt=  136  lbs.  BP= 114/66  Height= 66   inches.     TESTS  Imaging: Echocardiogram:  An echocardiogram in (2-d) mode was performed and in Doppler mode with color flow velocity mapping was performed. The aortic valve cusps are abnormal 1.7   cm, flow velocity 1.39   m/s, and systolic calculated mean flow gradient 4   mmHg. Mitral valve diastolic peak flow velocity E .948   m/s and E/A ratio 3.5. Aortic root diameter 3.4  cm. The LVOT internal diameter 2.5   cm and flow velocity was abnormal 0.82  m/s. LV systolic dimension 0000000  cm, diastolic 5.0  cm, posterior wall thickness 1.2  cm, fractional  shortening 18.2  %, and EF 37.5  %. IVS thickness 1.19  cm. LA dimension 3.8 cm. Mitral Valve has Severe Regurgitation. Tricuspid Valve has Severe Regurgitation. Aortic Valve has Mild Regurgitation. Pulmonic Valve has Trace Regurgitation.     ASSESSMENT  Technically adequate study.  Normal chamber sizes.  Normal left ventricular systolic function.  Mild left ventricular hypertrophy with GRADE 2 ( psuedonormalization) diastolic dysfunction.  Normal right ventricular systolic function.  Normal right ventricular diastolic function.  Mild pulmonary regurgitation.  Severe tricuspid regurgitation.  Borderline pulmonary hypertension.  Severe mitral regurgitation.  Mild aortic regurgitation.  No pericardial effusion.  Diffuse hypokinesis  Severely dilted Left and Right atrium   Severely dilted Left and Right ventricle  Normal Ao root and ascending Ao  Mild LVH    THERAPY   Referring physician: Dionisio David   Sonographer: Neoma Laming.   Neoma Laming MD  Electronically signed by: Neoma Laming     Date: 06/29/2021 14:06  Patient: [id number] - Jushua Hassan DOB:  12/11/1945  Date:  01/13/2017 10:00 Provider: Neoma Laming MD Encounter: ALL ANGIOGRAMS (CTA BRAIN, CAROTIDS, RENAL ARTERIES, PE)   Page 2 REASON FOR VISIT  Referred by Neoma Laming.    TESTS  Imaging: Computed Tomographic Angiography:  Cardiac multidetector CT was performed paying particular attention to the coronary arteries for the diagnosis of: Cardiomyopathy. Diagnostic Drugs:  Administered iohexol (Omnipaque) through an antecubital vein and images from the examination were analyzed for the presence and extent of coronary artery disease, using 3D image processing software. 100 mL of non-ionic contrast (Omnipaque) was used.   ASSESSMENT   The LIMA origin was visualized to LAD, patent  The SVG origin was visualized to OM, patent; to diagnal, patent; and to RCA, occluded     TEST CONCLUSIONS  Quality of study: Fair    1 - Calcium score is 3703.3.  2 - Co-dominant system.  3 - LIMA to LAD patent.  SVG to diagnal, occluded.  SVG to OM1, patent.  Treat medically.   Neoma Laming MD  Electronically signed by: Neoma Laming     Date: 01/14/2017 08:54   ASSESSMENT AND PLAN:    ICD-10-CM   1. PAF (paroxysmal atrial fibrillation) (HCC)  I48.0 amiodarone (PACERONE) 200 MG tablet    PCV ECHOCARDIOGRAM COMPLETE    2. Atrial fibrillation, chronic (HCC)  I48.20     3. Coronary artery disease involving native coronary artery of native heart without angina pectoris  I25.10     4. Chronic systolic congestive heart failure (HCC)  I50.22 PCV ECHOCARDIOGRAM COMPLETE    5. Primary hypertension  I10 PCV ECHOCARDIOGRAM COMPLETE    6. Ischemic cardiomyopathy  I25.5 PCV ECHOCARDIOGRAM COMPLETE    7. AICD (automatic cardioverter/defibrillator) present  Z95.810     8. Mixed hyperlipidemia  E78.2     9. S/P CABG x 4  Z95.1         Problem List Items Addressed This Visit       Cardiovascular and Mediastinum   PAF (paroxysmal atrial fibrillation) (HCC) - Primary   Relevant Medications   amiodarone (PACERONE) 200 MG tablet   Other Relevant Orders   PCV ECHOCARDIOGRAM COMPLETE   HTN (hypertension)    Hypotensive, denies dizziness.       Relevant Medications   amiodarone (PACERONE) 200 MG tablet   Other Relevant Orders   PCV ECHOCARDIOGRAM COMPLETE   CAD (coronary artery disease)    Denies chest pain.       Relevant Medications  amiodarone (PACERONE) 200 MG tablet   Atrial fibrillation, chronic (HCC)    In sinus rhythm on auscultation. Continue amiodarone.       Relevant Medications   amiodarone (PACERONE) 200 MG tablet   Ischemic cardiomyopathy   Relevant Medications   amiodarone (PACERONE) 200 MG tablet   Other Relevant Orders   PCV ECHOCARDIOGRAM COMPLETE   Chronic systolic congestive heart failure ([place])    EF 06/2021 EF 37.5%. Continue carvedilol, losartan. Repeat echo prior to next appointment in 3 months.       Relevant Medications   amiodarone (PACERONE) 200 MG tablet   Other Relevant Orders   PCV ECHOCARDIOGRAM COMPLETE     Other   HLD (hyperlipidemia)    Continue atorvastatin 80 mg.      Relevant Medications   amiodarone (PACERONE) 200 MG tablet   AICD (automatic cardioverter/defibrillator) present   S/P CABG x 4       Disposition:   Return in about 3 years (around 11/16/2025) for with echo prior.    Total time spent: 30 minutes  Signed,  Neoma Laming, MD  11/16/2022 [place]

## 2022-11-16 NOTE — Assessment & Plan Note (Signed)
EF 06/2021 EF 37.5%. Continue carvedilol, losartan. Repeat echo prior to next appointment in 3 months.

## 2022-11-16 NOTE — Assessment & Plan Note (Signed)
In sinus rhythm on auscultation. Continue amiodarone.

## 2022-11-16 NOTE — Assessment & Plan Note (Signed)
Continue atorvastatin 80 mg 

## 2022-11-27 ENCOUNTER — Other Ambulatory Visit: Payer: Self-pay | Admitting: Internal Medicine

## 2022-12-01 ENCOUNTER — Telehealth: Payer: Self-pay

## 2022-12-01 NOTE — Telephone Encounter (Signed)
[facility] called asking if we could send a referral for the patient to have palliative care as he will be getting discharged from hospice, I believe they faxed over the form as well its in your inbox

## 2022-12-07 ENCOUNTER — Other Ambulatory Visit: Payer: Medicare Other

## 2022-12-07 ENCOUNTER — Telehealth: Payer: Self-pay

## 2022-12-07 DIAGNOSIS — Z515 Encounter for palliative care: Secondary | ICD-10-CM

## 2022-12-07 NOTE — Progress Notes (Signed)
[place] Initial Encounter Note   PATIENT NAME: Walter Blair DOB: [birth year] MRN: [id number]  PRIMARY CARE PROVIDER: Jodi Marble, MD  RESPONSIBLE PARTY:  Acct ID - Guarantor Home Phone Work Phone Relationship Acct Type  [id number] DOW, [id number]  Self P/F     Susan Moore, [facility],  [id number]   I connected with pt and brother on 12/07/2022 by telephone and verified that I am speaking with the correct person using two identifiers.   I discussed the limitations of evaluation and management by telemedicine. The patient expressed understanding and agreed to proceed.    HISTORY OF PRESENT ILLNESS:  77 year old male with history of urothelial cell carcinoma, paroxysmal AFIB, HTN, HLD, CKD3a, chronic combined CHF EF 20%, CAD with angina, ischemic cardiomyopathy, AICD, AFIB, iron deficiency anemia, DMII with nephropathy history of CVA, thrombocytopenia, chronic dizziness, hypotension, history of upper GI bleed, history of recurrent rectal bleeding, GERD, and hypothyroidism.      Cognitive: Alert and oriented but does not participate a lot in conversation. Brother does most of the talking.   Appetite: Pt eating 2-3 meals per day, "eating most of it."   Mobility: Pt has walker and cane available for use, but usually walks around home by touching objects for stability. Opting not to use aides.   Cardiology: dizziness reported at times, MD aware. Edema reported to both lower legs (not new). RN discussed proceeding slowly with postural changes and elevating legs at all times when not ambulating.  Respiratory: DOE reported but does not wear oxygen.   Sleeping Pattern: Sleeps most of night, except when up for urination. Also takes naps during day. No issues with sleeping.  GI/GU: No issues. Reports that he is continent of both bowel and bladder.   Pain: denies any pain.  Palliative Care/ Hospice: RN explained role and purpose of palliative care  including visit frequency. Also discussed benefits of hospice care as well as the differences between the two with patient.   Goals of Care: Pt discharged from Hospice services last week. Pt and brother wish for pt to continue to do well.    CODE STATUS: DNR has form scanned into Epic. ADVANCED DIRECTIVES: N MOST FORM: No PPS:   Next appt scheduled: RN/SW to call to schedule      Jacqulyn Cane, RN

## 2022-12-07 NOTE — Telephone Encounter (Signed)
0904 Palliative Care Note  RN attempted to contact pt. Spoke with brother Princella Ion who requested that RN call back after 1100. Will retry then.  Jacqulyn Cane, RN

## 2022-12-27 ENCOUNTER — Telehealth: Payer: Self-pay

## 2022-12-27 NOTE — Telephone Encounter (Signed)
9 am.  Return call made to brother and visit scheduled for Friday at 1030 am.

## 2022-12-30 ENCOUNTER — Other Ambulatory Visit: Payer: Self-pay | Admitting: *Deleted

## 2022-12-30 DIAGNOSIS — R310 Gross hematuria: Secondary | ICD-10-CM

## 2022-12-31 ENCOUNTER — Other Ambulatory Visit: Payer: Medicare Other

## 2022-12-31 ENCOUNTER — Telehealth: Payer: Self-pay

## 2022-12-31 VITALS — BP 92/58 | HR 68 | Wt 132.2 lb

## 2022-12-31 DIAGNOSIS — Z515 Encounter for palliative care: Secondary | ICD-10-CM

## 2022-12-31 DIAGNOSIS — R310 Gross hematuria: Secondary | ICD-10-CM

## 2022-12-31 LAB — URINALYSIS, COMPLETE
Bilirubin, UA: NEGATIVE
Glucose, UA: NEGATIVE
Ketones, UA: NEGATIVE
Leukocytes,UA: NEGATIVE
Nitrite, UA: NEGATIVE
Protein,UA: NEGATIVE
RBC, UA: NEGATIVE
Specific Gravity, UA: <1.005 — ABNORMAL LOW (ref 1.005–1.030)
Urobilinogen, Ur: 0.2 mg/dL (ref 0.2–1.0)
pH, UA: 5.5 (ref 5.0–7.5)

## 2022-12-31 LAB — MICROSCOPIC EXAMINATION: RBC, Urine: NONE SEEN /hpf (ref 0–2)

## 2022-12-31 NOTE — Telephone Encounter (Signed)
Mr. Thiesse wanted me to make sure you all were aware that he is no longer under hospice care. He is now in the Palliative Care program. The hospice provider will no longer be doing refills on medications since he has discharged. Are you all okay to resume management of his medications/refills?

## 2022-12-31 NOTE — Progress Notes (Signed)
PATIENT NAME: Walter Blair DOB: 1946/04/12 MRN: [id number]  PRIMARY CARE PROVIDER: Sherron Monday, MD  RESPONSIBLE PARTY:  Acct ID - Guarantor Home Phone Work Phone Relationship Acct Type  [id number] RACHEL, MUNETON* [phone]  Self P/F     [address], [place], [place] [address]    Home visit completed with patient and brother.    Heart Failure:  Ongoing education provided.  Patient had not had significant issues with edema in the recent months.Weighing himself daily.  Only has shortness of breath when he walks long distances.  Sleeping with one pillow at this time.   Has a pacemaker and defib in place.  This will need a battery replacement in the next year per brother.   Medication Management:  Explained to patient and brother that medications will no longer be refilled by hospice provider as patient discharged from services in February.  Refills will need to go through PCP as it was prior to hospice admission.  [name] has requested that I contact PCP office to make sure they are aware patient is no longer under hospice and medications refills will need to be flipped back to them.  Secure message sent to Hassell Halim, CNA for PCP.  PCP is currently out of the office until the 23 rd and will confirm once he returns.  Palliative Care:  Explained changes within the Palliative Care program, including criteria and limitations.  Follow up visit scheduled for next month with RN/SW team.  CODE STATUS: Full-was previously a DNR but has revoked this.  ADVANCED DIRECTIVES: Yes MOST FORM: No PPS: 50%   PHYSICAL EXAM:   VITALS: Today's Vitals   12/31/22 1026  BP: (!) 92/58  Pulse: 68  SpO2: 98%  Weight: 132 lb 3.2 oz (60 kg)    LUNGS: clear to auscultation  CARDIAC: Cor RRR}  EXTREMITIES: 2+ bilateral ankle edema.  Brother reports this is baseline for patient.  SKIN: Skin color, texture, turgor normal. No rashes or lesions or mobility and turgor normal  NEURO: negative for  coordination problems, gait problems, and weakness       Truitt Merle, RN

## 2023-01-15 ENCOUNTER — Other Ambulatory Visit: Payer: Self-pay | Admitting: Cardiovascular Disease

## 2023-01-31 ENCOUNTER — Other Ambulatory Visit: Payer: Medicare Other

## 2023-01-31 DIAGNOSIS — Z515 Encounter for palliative care: Secondary | ICD-10-CM

## 2023-01-31 NOTE — Progress Notes (Signed)
COMMUNITY PALLIATIVE CARE SW NOTE  PATIENT NAMETobey Blair DOB: 03-Apr-1946 MRN: [id number]  PRIMARY CARE PROVIDER: Sherron Monday, MD  RESPONSIBLE PARTY:  Acct ID - Guarantor Home Phone Work Phone Relationship Acct Type  [id number] Walter Blair* [phone]  Self P/F     [address], [place], [place] [address]     PLAN OF CARE and INTERVENTIONS:              GOALS OF CARE/ ADVANCE CARE PLANNING: Goals include to maximize quality of life.                                                       Code Status: FULL CODE. Patient has revoked DNR.    ACD: POA documents completed. Brother Walter Blair is POA.                         GOC: Ongoing discussion.  2.        SOCIAL/EMOTIONAL/SPIRITUAL ASSESSMENT/ INTERVENTIONS:         Palliative care encounter: SW completed f/u home PC visit with patient and brother. Patient received sitting in living room.    Functional status: patient voiced no concerns or changes since previous PC visit.  CHF: Patient has an ECHO scheduled for 5/21. Patients bilateral ankles appear a bit swollen/puffy. Patient share that he has been weighing himself daily. Today's weight is 133.7lbs, yesterday's weight was the same. patient advised that he does elevate his legs. Education provided on daily weights and leg elevation. SW provided cardia education book.  Pain: patient denied pain. Mobility: patient is ambulatory w/o AD. Patient is able to maneuver stairs as he lives in a 2 story home. Appetite: no changes. Patient eats 2-3 meals a day and snacks some during the day or drinks an ensure.   sleeping: no concerns  medications: compliant. no concerns.  Breathing: the same. becomes SOB with long distance walking.   Psychosocial assessment: completed.   In home support: Patient lives in 2 story home with brother.  Transportation: no needs.  Food: no food insecurities witnessed.   Safety and long term planning: patient feels safe in his home and desires  to remain in his home.    Questions and concerns were addressed. The patient/family was encouraged to call with any additional questions and/or concerns. PC Provided general support and encouragement, no other unmet needs identified.    PC to follow up 3-4 weeks.   3.         PATIENT/CAREGIVER EDUCATION/ COPING:   Appearance: well groomed, appropriate given situation  Mental Status: A&O  Eye Contact: poor Thought Process: rational  Thought Content: not assessed Speech: clear  Mood: Normal and calm Affect: flat Insight: good Judgement: good Interaction Style: Cooperative   Patient able to make basic needs known.     4.         PERSONAL EMERGENCY PLAN:  family will call 9-1-1 for emergencies.    5.         COMMUNITY RESOURCES COORDINATION/ HEALTH CARE NAVIGATION:  none   6.      FINANCIAL CONCERNS/NEEDS: None                         Primary Health Insurance: Medicare Secondary Health  Insurance: [employer] of [place]      SOCIAL HX:  Social History   Tobacco Use   Smoking status: Former    Types: Cigarettes   Smokeless tobacco: Former    Types: Chew  Substance Use Topics   Alcohol use: No    CODE STATUS: FULL CODE ADVANCED DIRECTIVES: Y - Brother is POA MOST FORM COMPLETE: N HOSPICE EDUCATION PROVIDED: N  PPS: 50%  Time spent: 30 min      [place], [place]

## 2023-02-08 ENCOUNTER — Ambulatory Visit (INDEPENDENT_AMBULATORY_CARE_PROVIDER_SITE_OTHER): Payer: Medicare Other

## 2023-02-08 ENCOUNTER — Other Ambulatory Visit: Payer: Self-pay

## 2023-02-08 DIAGNOSIS — I340 Nonrheumatic mitral (valve) insufficiency: Secondary | ICD-10-CM | POA: Diagnosis not present

## 2023-02-08 DIAGNOSIS — I480 Paroxysmal atrial fibrillation: Secondary | ICD-10-CM

## 2023-02-08 DIAGNOSIS — I371 Nonrheumatic pulmonary valve insufficiency: Secondary | ICD-10-CM | POA: Diagnosis not present

## 2023-02-08 DIAGNOSIS — I5022 Chronic systolic (congestive) heart failure: Secondary | ICD-10-CM

## 2023-02-08 DIAGNOSIS — I10 Essential (primary) hypertension: Secondary | ICD-10-CM

## 2023-02-08 DIAGNOSIS — I361 Nonrheumatic tricuspid (valve) insufficiency: Secondary | ICD-10-CM

## 2023-02-08 DIAGNOSIS — I255 Ischemic cardiomyopathy: Secondary | ICD-10-CM

## 2023-02-08 MED ORDER — MECLIZINE HCL 25 MG PO TABS: 25.0000 mg | ORAL_TABLET | Freq: Three times a day (TID) | ORAL | 3 refills | Status: DC | PRN

## 2023-02-08 MED ORDER — LOSARTAN POTASSIUM 25 MG PO TABS: 25.0000 mg | ORAL_TABLET | Freq: Every day | ORAL | 3 refills | Status: DC

## 2023-02-08 MED ORDER — AMIODARONE HCL 200 MG PO TABS: 100.0000 mg | ORAL_TABLET | Freq: Every day | ORAL | 2 refills | Status: DC

## 2023-02-08 MED ORDER — ATORVASTATIN CALCIUM 80 MG PO TABS: 80.0000 mg | ORAL_TABLET | Freq: Every day | ORAL | 3 refills | Status: DC

## 2023-02-08 MED ORDER — CARVEDILOL 3.125 MG PO TABS: 3.1250 mg | ORAL_TABLET | Freq: Two times a day (BID) | ORAL | 0 refills | Status: DC

## 2023-02-21 ENCOUNTER — Ambulatory Visit (INDEPENDENT_AMBULATORY_CARE_PROVIDER_SITE_OTHER): Payer: Medicare Other | Admitting: Internal Medicine

## 2023-02-21 VITALS — BP 104/62 | HR 80 | Ht 66.0 in | Wt 135.0 lb

## 2023-02-21 DIAGNOSIS — R42 Dizziness and giddiness: Secondary | ICD-10-CM | POA: Diagnosis not present

## 2023-02-21 DIAGNOSIS — N1831 Chronic kidney disease, stage 3a: Secondary | ICD-10-CM | POA: Diagnosis not present

## 2023-02-21 DIAGNOSIS — E782 Mixed hyperlipidemia: Secondary | ICD-10-CM

## 2023-02-21 DIAGNOSIS — E1122 Type 2 diabetes mellitus with diabetic chronic kidney disease: Secondary | ICD-10-CM

## 2023-02-21 DIAGNOSIS — I10 Essential (primary) hypertension: Secondary | ICD-10-CM | POA: Diagnosis not present

## 2023-02-21 DIAGNOSIS — D508 Other iron deficiency anemias: Secondary | ICD-10-CM

## 2023-02-21 LAB — POCT CBG (FASTING - GLUCOSE)-MANUAL ENTRY: Glucose Fasting, POC: 194 mg/dL — AB (ref 70–99)

## 2023-02-21 MED ORDER — MECLIZINE HCL 25 MG PO TABS
25.0000 mg | ORAL_TABLET | Freq: Three times a day (TID) | ORAL | 1 refills | Status: AC | PRN
Start: 2023-02-21 — End: 2023-05-22

## 2023-02-21 MED ORDER — FOLIC ACID 1 MG PO TABS: 1.0000 mg | ORAL_TABLET | Freq: Every morning | ORAL | 1 refills | Status: DC

## 2023-02-21 NOTE — Progress Notes (Signed)
Established Patient Office Visit  Subjective:  Patient ID: Walter Blair, male    DOB: 10-22-1945  Age: 77 y.o. MRN: [id number]  Chief Complaint  Patient presents with   Follow-up    4 Months Follow Up    No new complaints, here for lab review and medication refills. Last A1c 5.5, stable CKD 3a and no longer on Hospice services.    No other concerns at this time.   Past Medical History:  Diagnosis Date   AICD (automatic cardioverter/defibrillator) present    Aortic atherosclerosis (HCC)    Atrial fibrillation (HCC)    a.) CHA2DS2-VASc = 7 (age x 2, CHF, CVA x 2, aortic plaque, T2DM). b.) rate/rhythm maintained on oral amiodarone; chronically anticoagulated on rivaroxaban. 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 b.) TTE 07/25/2021: EF 20-25%; global HK; G3DD; severely reduced RV function; Severe BAR; mod-severe MR, severe TR, mild AR.   HLD (hyperlipidemia)    HTN (hypertension)    Hypothyroidism    IDA (iron deficiency anemia)    Ischemic cardiomyopathy    a.) TTE 06/29/2017: EF 10-15%. b.) TTE 07/25/2021: ED 20-25%.   Left-sided cerebrovascular accident (CVA) (HCC) 2004   a.) residual weakness in LEFT hand   Long term current use of anticoagulant    a.) rivaroxaban   Walter Blair/P CABG x 4 2003   Performed in [place]   T2DM (type 2 diabetes mellitus) (HCC)    Thrombocytopenia (HCC)    possibly associated with underlying DIC    Past Surgical History:  Procedure Laterality Date   BLADDER INSTILLATION N/A 08/07/2021   Procedure: BLADDER INSTILLATION  OF GEMCITABINE;  Surgeon: Sondra Come, MD;  Location: [facility] ORS;  Service: Urology;  Laterality: N/A;   CARDIAC DEFIBRILLATOR PLACEMENT     CARDIOVERSION N/A 07/01/2017   Procedure: CARDIOVERSION;  Surgeon: Laurier Nancy, MD;  Location: [facility] ORS;  Service: Cardiovascular;  Laterality: N/A;   CARDIOVERSION N/A 07/05/2017   Procedure: CARDIOVERSION;  Surgeon: Laurier Nancy, MD;  Location: [facility] ORS;  Service: Cardiovascular;  Laterality: N/A;   COLONOSCOPY WITH PROPOFOL N/A 12/30/2020   Procedure: COLONOSCOPY WITH PROPOFOL;  Surgeon: Wyline Mood, MD;  Location[facility] ENDOSCOPY;  Service: Gastroenterology;  Laterality: N/A;   CORONARY ARTERY BYPASS GRAFT N/A 2003   Procedure: Four-vessel CABG performed in [place]   CYSTOSCOPY W/ RETROGRADES Bilateral 08/07/2021   Procedure: CYSTOSCOPY WITH RETROGRADE PYELOGRAM;  Surgeon: Sondra Come, MD;  Location: [facility] ORS;  Service: Urology;  Laterality: Bilateral;   ESOPHAGOGASTRODUODENOSCOPY  12/30/2020   Procedure: ESOPHAGOGASTRODUODENOSCOPY (EGD);  Surgeon: Wyline Mood, MD;  Location: [facility] ENDOSCOPY;  Service: Gastroenterology;;   ESOPHAGOGASTRODUODENOSCOPY N/A 09/17/2021   Procedure: ESOPHAGOGASTRODUODENOSCOPY (EGD);  Surgeon: Pasty Spillers, MD;  Location: [facility] ENDOSCOPY;  Service: Endoscopy;  Laterality: N/A;   GIVENS CAPSULE STUDY N/A 02/02/2021   Procedure: GIVENS CAPSULE STUDY;  Surgeon: Wyline Mood, MD;  Location: [facility] ENDOSCOPY;  Service: Gastroenterology;  Laterality: N/A;  WILL NEED INTERPRETER ON WHEELS;  BROTHER WANTS TO TRANSLATE   TEE  WITHOUT CARDIOVERSION N/A 07/01/2017   Procedure: TRANSESOPHAGEAL ECHOCARDIOGRAM (TEE);  Surgeon: Laurier Nancy, MD;  Location: [facility] ORS;  Service: Cardiovascular;  Laterality: N/A;   TEE WITHOUT CARDIOVERSION N/A 07/05/2017   Procedure: TRANSESOPHAGEAL ECHOCARDIOGRAM (TEE);  Surgeon: Laurier Nancy, MD;  Location: [facility] ORS;  Service: Cardiovascular;  Laterality: N/A;   TRANSURETHRAL  RESECTION OF BLADDER TUMOR N/A 08/07/2021   Procedure: TRANSURETHRAL RESECTION OF BLADDER TUMOR (TURBT);  Surgeon: Sondra Come, MD;  Location: [facility] ORS;  Service: Urology;  Laterality: N/A;   URETEROSCOPY Left 08/07/2021   Procedure: DIAGNOSTIC URETEROSCOPY;  Surgeon: Sondra Come, MD;  Location: [facility] ORS;  Service: Urology;  Laterality: Left;    Social History   Socioeconomic History   Marital status: Widowed    Spouse name: Not on file   Number of children: Not on file   Years of education: Not on file   Highest education level: Not on file  Occupational History   Not on file  Tobacco Use   Smoking status: Former    Types: Cigarettes   Smokeless tobacco: Former    Types: Chew  Substance and Sexual Activity   Alcohol use: No   Drug use: No   Sexual activity: Not Currently  Other Topics Concern   Not on file  Social History Narrative   Lives with brother, POA   Social Determinants of Health   Financial Resource Strain: Not on file  Food Insecurity: Not on file  Transportation Needs: Not on file  Physical Activity: Not on file  Stress: Not on file  Social Connections: Not on file  Intimate Partner Violence: Not on file    Family History  Problem Relation Age of Onset   Diabetes Brother    Hypertension Mother    Diabetes Sister     Allergies  Allergen Reactions   Lorazepam Shortness Of Breath and Other (See Comments)    Pt experienced adverse reaction and was transferred to ICU last time they were given Med Other reaction(Walter Blair): Other (See Comments) Pt experienced adverse reaction and was transferred to ICU last time they were given Med   Pork-Derived Products Other (See Comments)    Cultural reasons    Review of Systems  Constitutional: Negative.   HENT: Negative.    Eyes: Negative.   Respiratory: Negative.    Cardiovascular: Negative.   Gastrointestinal: Negative.   Genitourinary: Negative.   Skin: Negative.   Neurological: Negative.    Endo/Heme/Allergies: Negative.        Objective:   BP 104/62   Pulse 80   Ht 5\' 6"  (1.676 m)   Wt 135 lb (61.2 kg)   SpO2 100%   BMI 21.79 kg/m   Vitals:   02/21/23 1015  BP: 104/62  Pulse: 80  Height: 5\' 6"  (1.676 m)  Weight: 135 lb (61.2 kg)  SpO2: 100%  BMI (Calculated): 21.8    Physical Exam Vitals reviewed.  Constitutional:      Appearance: Normal appearance. He is well-developed.     Comments: Asthenic build  HENT:     Head: Normocephalic.     Left Ear: There is no impacted cerumen.     Nose: Nose normal.     Mouth/Throat:     Mouth: Mucous membranes are moist.     Pharynx: No posterior oropharyngeal erythema.  Eyes:     Extraocular Movements: Extraocular movements intact.     Pupils: Pupils are equal, round, and reactive to light.  Cardiovascular:  Rate and Rhythm: Regular rhythm.     Chest Wall: PMI is not displaced.     Pulses: Normal pulses.     Heart sounds: Normal heart sounds. No murmur heard. Pulmonary:     Effort: Pulmonary effort is normal.     Breath sounds: Normal air entry. No rhonchi or rales.  Abdominal:     General: Abdomen is flat. Bowel sounds are normal. There is no distension.     Palpations: Abdomen is soft. There is no hepatomegaly, splenomegaly or mass.     Tenderness: There is no abdominal tenderness.  Musculoskeletal:        General: Normal range of motion.     Cervical back: Normal range of motion and neck supple.     Right lower leg: No edema.     Left lower leg: No edema.  Skin:    General: Skin is warm and dry.  Neurological:     General: No focal deficit present.     Mental Status: He is alert and oriented to person, place, and time.     Cranial Nerves: No cranial nerve deficit.     Motor: No weakness.  Psychiatric:        Mood and Affect: Mood normal.        Behavior: Behavior normal.      Results for orders placed or performed in visit on 02/21/23  POCT CBG (Fasting - Glucose)  Result Value Ref Range    Glucose Fasting, POC 194 (A) 70 - 99 mg/dL    Recent Results (from the past 2160 hour(Primitivo Merkey))  Urinalysis, Complete     Status: Abnormal   Collection Time: 12/31/22  1:13 PM  Result Value Ref Range   Specific Gravity, UA <1.005 (L) 1.005 - 1.030   pH, UA 5.5 5.0 - 7.5   Color, UA Yellow Yellow   Appearance Ur Clear Clear   Leukocytes,UA Negative Negative   Protein,UA Negative Negative/Trace   Glucose, UA Negative Negative   Ketones, UA Negative Negative   RBC, UA Negative Negative   Bilirubin, UA Negative Negative   Urobilinogen, Ur 0.2 0.2 - 1.0 mg/dL   Nitrite, UA Negative Negative   Microscopic Examination See below:   Microscopic Examination     Status: Abnormal   Collection Time: 12/31/22  1:13 PM   Urine  Result Value Ref Range   WBC, UA 0-5 0 - 5 /hpf   RBC, Urine None seen 0 - 2 /hpf   Epithelial Cells (non renal) 0-10 0 - 10 /hpf   Renal Epithel, UA 0-10 (A) None seen /hpf   Bacteria, UA Few None seen/Few  POCT CBG (Fasting - Glucose)     Status: Abnormal   Collection Time: 02/21/23 10:19 AM  Result Value Ref Range   Glucose Fasting, POC 194 (A) 70 - 99 mg/dL      Assessment & Plan:   As per problem list  Problem List Items Addressed This Visit       Endocrine   Type II diabetes mellitus with renal manifestations (HCC) - Primary   Relevant Orders   POCT CBG (Fasting - Glucose) (Completed)    No follow-ups on file.   Total time spent: 20 minutes  Walter Fuse, MD  02/21/2023   This document may have been prepared by [facility] Voice Recognition software and as such may include unintentional dictation errors.

## 2023-02-22 ENCOUNTER — Other Ambulatory Visit: Payer: Medicare Other

## 2023-02-22 VITALS — BP 92/50 | HR 68 | Temp 97.2°F | Wt 135.6 lb

## 2023-02-22 DIAGNOSIS — Z515 Encounter for palliative care: Secondary | ICD-10-CM

## 2023-02-22 NOTE — Progress Notes (Signed)
PATIENT NAME: Walter Blair DOB: September 02, 1946 MRN: [id number]  PRIMARY CARE PROVIDER: Sherron Monday, MD  RESPONSIBLE PARTY:  Acct ID - Guarantor Home Phone Work Phone Relationship Acct Type  [id number] QUINTARUS, CRAVENS* [phone]  Self P/F     [address], [place], [place] [address]   Home visit completed with patient and brother.   ACP: Has revoked DNR status and desires full code.    Appetite: Fluctuates per patient.  Eats breakfast and supper but generally misses lunch.  States he will have a piece of fruit usually at lunch time.  See weights below.   CHF: No complaints of chest pain or palpitations.  Shortness of breath reported when walking long distances or climbing stairs.  Edema present to bilateral lower extremities (see below).  Weighing himself daily.  Weight for the last month has ranged from 132.2-136.2 lbs.  Has pacer/defib that will need to be replaced soon.  Patient has a follow up visit with cardiology to address.  Recently completed echo and will get results next week.    Medication Management:  No refills needed at this time.  No issues with refills since PCP office is aware patient is no longer on hospice services.   Pain:  No issues with pain reported.   CODE STATUS: Full ADVANCED DIRECTIVES: Yes MOST FORM: No PPS: 50%   PHYSICAL EXAM:   VITALS: Today's Vitals   02/22/23 0954  BP: (!) 92/50  Pulse: 68  Temp: (!) 97.2 F (36.2 C)  SpO2: 98%  Weight: 135 lb 9.6 oz (61.5 kg)    LUNGS: clear to auscultation  CARDIAC: Cor RRR}  EXTREMITIES: 2+ bilateral ankle edema SKIN: Skin color, texture, turgor normal. No rashes or lesions or mobility and turgor normal  NEURO: positive for gait problems has a walker in the home but does not always use this.       Truitt Merle, RN

## 2023-03-03 ENCOUNTER — Ambulatory Visit (INDEPENDENT_AMBULATORY_CARE_PROVIDER_SITE_OTHER): Payer: Medicare Other | Admitting: Cardiovascular Disease

## 2023-03-03 ENCOUNTER — Encounter: Payer: Self-pay | Admitting: Cardiovascular Disease

## 2023-03-03 VITALS — BP 106/54 | HR 70 | Ht 66.0 in | Wt 135.0 lb

## 2023-03-03 DIAGNOSIS — I2583 Coronary atherosclerosis due to lipid rich plaque: Secondary | ICD-10-CM

## 2023-03-03 DIAGNOSIS — I700 Atherosclerosis of aorta: Secondary | ICD-10-CM

## 2023-03-03 DIAGNOSIS — I5022 Chronic systolic (congestive) heart failure: Secondary | ICD-10-CM | POA: Diagnosis not present

## 2023-03-03 DIAGNOSIS — I5043 Acute on chronic combined systolic (congestive) and diastolic (congestive) heart failure: Secondary | ICD-10-CM | POA: Diagnosis not present

## 2023-03-03 DIAGNOSIS — I2510 Atherosclerotic heart disease of native coronary artery without angina pectoris: Secondary | ICD-10-CM

## 2023-03-03 DIAGNOSIS — I159 Secondary hypertension, unspecified: Secondary | ICD-10-CM

## 2023-03-08 NOTE — Progress Notes (Signed)
Cardiology Office Note   Date:  03/08/2023   ID:  Walter, Blair 1946-06-02, MRN [id number]  PCP:  Sherron Monday, MD  Cardiologist:  Adrian Blackwater, MD      History of Present Illness: Walter Blair is a 77 y.o. male who presents for  Chief Complaint  Patient presents with   Follow-up    3 Months & ECHO Results    Doing well      Past Medical History:  Diagnosis Date   AICD (automatic cardioverter/defibrillator) present    Aortic atherosclerosis (HCC)    Atrial fibrillation (HCC)    a.) CHA2DS2-VASc = 7 (age x 2, CHF, CVA x 2, aortic plaque, T2DM). b.) rate/rhythm maintained on oral amiodarone; chronically anticoagulated on rivaroxaban. 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 b.) TTE 07/25/2021: EF 20-25%; global HK; G3DD; severely reduced RV function; Severe BAR; mod-severe MR, severe TR, mild AR.   HLD (hyperlipidemia)    HTN (hypertension)    Hypothyroidism    IDA (iron deficiency anemia)    Ischemic cardiomyopathy    a.) TTE 06/29/2017: EF 10-15%. b.) TTE 07/25/2021: ED 20-25%.   Left-sided cerebrovascular accident (CVA) (HCC) 2004   a.) residual weakness in LEFT hand   Long term current use of anticoagulant    a.) rivaroxaban   S/P CABG x 4 2003   Performed in [place]   T2DM (type 2 diabetes mellitus) (HCC)    Thrombocytopenia (HCC)    possibly associated with underlying DIC     Past Surgical History:  Procedure Laterality Date   BLADDER INSTILLATION N/A 08/07/2021   Procedure: BLADDER INSTILLATION OF GEMCITABINE;   Surgeon: Sondra Come, MD;  Location: [facility] ORS;  Service: Urology;  Laterality: N/A;   CARDIAC DEFIBRILLATOR PLACEMENT     CARDIOVERSION N/A 07/01/2017   Procedure: CARDIOVERSION;  Surgeon: Laurier Nancy, MD;  Location: [facility] ORS;  Service: Cardiovascular;  Laterality: N/A;   CARDIOVERSION N/A 07/05/2017   Procedure: CARDIOVERSION;  Surgeon: Laurier Nancy, MD;  Location: [facility] ORS;  Service: Cardiovascular;  Laterality: N/A;   COLONOSCOPY WITH PROPOFOL N/A 12/30/2020   Procedure: COLONOSCOPY WITH PROPOFOL;  Surgeon: Wyline Mood, MD;  Location: [facility] ENDOSCOPY;  Service: Gastroenterology;  Laterality: N/A;   CORONARY ARTERY BYPASS GRAFT N/A 2003   Procedure: Four-vessel CABG performed in [place]   CYSTOSCOPY W/ RETROGRADES Bilateral 08/07/2021   Procedure: CYSTOSCOPY WITH RETROGRADE PYELOGRAM;  Surgeon: Sondra Come, MD;  Location: [facility] ORS;  Service: Urology;  Laterality: Bilateral;   ESOPHAGOGASTRODUODENOSCOPY  12/30/2020   Procedure: ESOPHAGOGASTRODUODENOSCOPY (EGD);  Surgeon: Wyline Mood, MD;  Location: [facility] ENDOSCOPY;  Service: Gastroenterology;;   ESOPHAGOGASTRODUODENOSCOPY N/A 09/17/2021   Procedure: ESOPHAGOGASTRODUODENOSCOPY (EGD);  Surgeon: Pasty Spillers, MD;  Location: [facility] ENDOSCOPY;  Service: Endoscopy;  Laterality: N/A;   GIVENS CAPSULE STUDY N/A 02/02/2021   Procedure: GIVENS CAPSULE STUDY;  Surgeon: Wyline Mood, MD;  Location: [facility] ENDOSCOPY;  Service: Gastroenterology;  Laterality: N/A;  WILL NEED INTERPRETER ON WHEELS;  BROTHER WANTS TO  TRANSLATE   TEE WITHOUT CARDIOVERSION N/A 07/01/2017   Procedure: TRANSESOPHAGEAL ECHOCARDIOGRAM (TEE);  Surgeon: Laurier Nancy, MD;  Location: [facility] ORS;  Service: Cardiovascular;  Laterality: N/A;   TEE WITHOUT CARDIOVERSION N/A 07/05/2017   Procedure: TRANSESOPHAGEAL ECHOCARDIOGRAM (TEE);  Surgeon: Laurier Nancy, MD;  Location: [facility] ORS;  Service: Cardiovascular;  Laterality: N/A;   TRANSURETHRAL RESECTION OF BLADDER  TUMOR N/A 08/07/2021   Procedure: TRANSURETHRAL RESECTION OF BLADDER TUMOR (TURBT);  Surgeon: Sondra Come, MD;  Location: [facility] ORS;  Service: Urology;  Laterality: N/A;   URETEROSCOPY Left 08/07/2021   Procedure: DIAGNOSTIC URETEROSCOPY;  Surgeon: Sondra Come, MD;  Location: [facility] ORS;  Service: Urology;  Laterality: Left;     Current Outpatient Medications  Medication Sig Dispense Refill   amiodarone (PACERONE) 200 MG tablet Take 0.5 tablets (100 mg total) by mouth daily. Home med 45 tablet 2   atorvastatin (LIPITOR) 80 MG tablet Take 1 tablet (80 mg total) by mouth daily. 90 tablet 3   carvedilol (COREG) 3.125 MG tablet Take 1 tablet (3.125 mg total) by mouth 2 (two) times daily. 180 tablet 0   ferrous sulfate (FERROUSUL) 325 (65 FE) MG tablet Take 1 tablet (325 mg total) by mouth daily with breakfast. Take 1 tablet twice a day every other day for 90 days. 30 tablet 2   folic acid (FOLVITE) 1 MG tablet Take 1 tablet (1 mg total) by mouth every morning. 90 tablet 1   levothyroxine (SYNTHROID) 88 MCG tablet Take 88 mcg by mouth daily before breakfast.     Levothyroxine Sodium 88 MCG CAPS Take 88 mcg by mouth daily.     losartan (COZAAR) 25 MG tablet Take 1 tablet (25 mg total) by mouth daily. 90 tablet 3   meclizine (ANTIVERT) 25 MG tablet Take 1 tablet (25 mg total) by mouth 3 (three) times daily as needed. 135 tablet 1   metFORMIN (GLUCOPHAGE-XR) 500 MG 24 hr tablet Take 500 mg by mouth daily.     pantoprazole (PROTONIX) 20 MG tablet TAKE 1 TABLET BY MOUTH EVERY DAY 90 tablet 1   torsemide (DEMADEX) 10 MG tablet TAKE 1 TABLET BY MOUTH TWICE DAILY 180 tablet 1   No current facility-administered medications for this visit.    Allergies:   Lorazepam and Pork-derived products    Social History:   reports that he has quit smoking. His smoking use included cigarettes. He has quit using smokeless tobacco.  His smokeless tobacco use included chew. He reports that he does not drink  alcohol and does not use drugs.   Family History:  family history includes Diabetes in his brother and sister; Hypertension in his mother.    ROS:     Review of Systems  Constitutional: Negative.   HENT: Negative.    Eyes: Negative.   Respiratory: Negative.    Gastrointestinal: Negative.   Genitourinary: Negative.   Musculoskeletal: Negative.   Skin: Negative.   Neurological: Negative.   Endo/Heme/Allergies: Negative.   Psychiatric/Behavioral: Negative.    All other systems reviewed and are negative.     All other systems are reviewed and negative.    PHYSICAL EXAM: VS:  BP (!) 106/54   Pulse 70   Ht 5\' 6"  (1.676 m)   Wt 135 lb (61.2 kg)   SpO2 96%   BMI 21.79 kg/m  , BMI Body mass index is 21.79 kg/m. Last weight:  Wt Readings from Last 3 Encounters:  03/03/23 135 lb (61.2 kg)  02/22/23 135  lb 9.6 oz (61.5 kg)  02/21/23 135 lb (61.2 kg)     Physical Exam Vitals reviewed.  Constitutional:      Appearance: Normal appearance. He is normal weight.  HENT:     Head: Normocephalic.     Nose: Nose normal.     Mouth/Throat:     Mouth: Mucous membranes are moist.  Eyes:     Pupils: Pupils are equal, round, and reactive to light.  Cardiovascular:     Rate and Rhythm: Normal rate and regular rhythm.     Pulses: Normal pulses.     Heart sounds: Normal heart sounds.  Pulmonary:     Effort: Pulmonary effort is normal.  Abdominal:     General: Abdomen is flat. Bowel sounds are normal.  Musculoskeletal:        General: Normal range of motion.     Cervical back: Normal range of motion.  Skin:    General: Skin is warm.  Neurological:     General: No focal deficit present.     Mental Status: He is alert.  Psychiatric:        Mood and Affect: Mood normal.       EKG:   Recent Labs: 04/06/2022: Hemoglobin 10.2; Platelets 114    Lipid Panel No results found for: "CHOL", "TRIG", "HDL", "CHOLHDL", "VLDL", "LDLCALC", "LDLDIRECT"    Other studies  Reviewed: Additional studies/ records that were reviewed today include:  Review of the above records demonstrates:       No data to display            ASSESSMENT AND PLAN:    ICD-10-CM   1. Abdominal aortic atherosclerosis (HCC)  I70.0     2. Acute on chronic combined systolic and diastolic congestive heart failure (HCC)  I50.43    compensated    3. Coronary artery disease due to lipid rich plaque  I25.10    I25.83     4. Chronic systolic congestive heart failure (HCC)  I50.22     5. Secondary hypertension  I15.9        Problem List Items Addressed This Visit       Cardiovascular and Mediastinum   HTN (hypertension)   CAD (coronary artery disease)   Acute exacerbation of CHF (congestive heart failure) (HCC)   Abdominal aortic atherosclerosis (HCC) - Primary   Chronic systolic congestive heart failure (HCC)       Disposition:   Return in about 2 months (around 05/03/2023).    Total time spent: 30 minutes  Signed,  Adrian Blackwater, MD  03/08/2023 3:08 PM    [facility]

## 2023-03-11 ENCOUNTER — Encounter: Payer: Self-pay | Admitting: Internal Medicine

## 2023-03-22 ENCOUNTER — Other Ambulatory Visit: Payer: Self-pay | Admitting: Internal Medicine

## 2023-04-06 ENCOUNTER — Other Ambulatory Visit: Payer: Medicare Other

## 2023-04-06 ENCOUNTER — Telehealth: Payer: Self-pay

## 2023-04-07 NOTE — Telephone Encounter (Signed)
Spoke with Raynelle Fanning who will be reaching out to pt to let them know.

## 2023-05-19 ENCOUNTER — Ambulatory Visit (INDEPENDENT_AMBULATORY_CARE_PROVIDER_SITE_OTHER): Payer: Medicare Other | Admitting: Cardiovascular Disease

## 2023-05-19 ENCOUNTER — Encounter: Payer: Self-pay | Admitting: Cardiovascular Disease

## 2023-05-19 VITALS — BP 107/61 | HR 73 | Ht 65.0 in | Wt 139.2 lb

## 2023-05-19 DIAGNOSIS — I5043 Acute on chronic combined systolic (congestive) and diastolic (congestive) heart failure: Secondary | ICD-10-CM

## 2023-05-19 DIAGNOSIS — I480 Paroxysmal atrial fibrillation: Secondary | ICD-10-CM

## 2023-05-19 DIAGNOSIS — I255 Ischemic cardiomyopathy: Secondary | ICD-10-CM

## 2023-05-19 DIAGNOSIS — N1831 Chronic kidney disease, stage 3a: Secondary | ICD-10-CM

## 2023-05-19 DIAGNOSIS — I700 Atherosclerosis of aorta: Secondary | ICD-10-CM

## 2023-05-19 DIAGNOSIS — N179 Acute kidney failure, unspecified: Secondary | ICD-10-CM

## 2023-05-19 DIAGNOSIS — E119 Type 2 diabetes mellitus without complications: Secondary | ICD-10-CM

## 2023-05-19 MED ORDER — CARVEDILOL 6.25 MG PO TABS
6.2500 mg | ORAL_TABLET | Freq: Two times a day (BID) | ORAL | 11 refills | Status: DC
Start: 2023-05-19 — End: 2024-04-16

## 2023-05-19 NOTE — Progress Notes (Signed)
Cardiology Office Note   Date:  05/19/2023   ID:  Javad, Kio [birth year], MRN [id number]  PCP:  Sherron Monday, MD  Cardiologist:  Adrian Blackwater, MD      History of Present Illness: Walter Blair is a 77 y.o. male who presents for  Chief Complaint  Patient presents with  . Follow-up    HPI    Past Medical History:  Diagnosis Date  . AICD (automatic cardioverter/defibrillator) present   . Aortic atherosclerosis (HCC)   . Atrial fibrillation (HCC)    a.) CHA2DS2-VASc = 7 (age x 2, CHF, CVA x 2, aortic plaque, T2DM). b.) rate/rhythm maintained on oral amiodarone; chronically anticoagulated on rivaroxaban. c.) s/p DCCV 07/01/2017 and 07/05/2017  . 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   a.) cystoscopy 08/05/2021 --> ~2-3 cm papillary tumor at the LEFT bladder base  . CAD (coronary artery disease)   . CHF (congestive heart failure) (HCC)   . CKD (chronic kidney disease), stage III (HCC)   . Dyspnea   . GERD (gastroesophageal reflux disease)   . HFrEF (heart failure with reduced ejection fraction) (HCC)    a.) TTE 06/29/2017: EF 10-15%; diffuse HK; mild AR, severe MR/TR, mod PR; BAE. b.) TTE 07/25/2021: EF 20-25%; global HK; G3DD; severely reduced RV function; Severe BAR; mod-severe MR, severe TR, mild AR.  Marland Kitchen HLD (hyperlipidemia)   . HTN (hypertension)   . Hypothyroidism   . IDA (iron deficiency anemia)   . Ischemic cardiomyopathy    a.) TTE 06/29/2017: EF 10-15%. b.) TTE 07/25/2021: ED 20-25%.  . Left-sided cerebrovascular accident (CVA) (HCC) 2004   a.) residual weakness in LEFT hand  . Long term current use of anticoagulant    a.) rivaroxaban  . S/P CABG x 4 2003   Performed in [place]  . T2DM (type 2 diabetes mellitus) (HCC)   . Thrombocytopenia (HCC)    possibly associated with underlying DIC     Past Surgical History:  Procedure Laterality Date  . BLADDER INSTILLATION N/A 08/07/2021   Procedure: BLADDER INSTILLATION OF GEMCITABINE;  Surgeon: Sondra Come, MD;  Location: [facility] ORS;  Service: Urology;  Laterality: N/A;  . CARDIAC DEFIBRILLATOR PLACEMENT    . CARDIOVERSION N/A 07/01/2017   Procedure: CARDIOVERSION;  Surgeon: Laurier Nancy, MD;  Location: [facility];  Service: Cardiovascular;  Laterality: N/A;  . CARDIOVERSION N/A 07/05/2017   Procedure: CARDIOVERSION;  Surgeon: Laurier Nancy, MD;  Location: [facility];  Service: Cardiovascular;  Laterality: N/A;  . COLONOSCOPY WITH PROPOFOL N/A 12/30/2020   Procedure: COLONOSCOPY WITH PROPOFOL;  Surgeon: Wyline Mood, MD;  Location: [facility] ENDOSCOPY;  Service: Gastroenterology;  Laterality: N/A;  . CORONARY ARTERY BYPASS GRAFT N/A 2003   Procedure: Four-vessel CABG performed in [place]  . CYSTOSCOPY W/ RETROGRADES Bilateral 08/07/2021   Procedure: CYSTOSCOPY WITH RETROGRADE PYELOGRAM;  Surgeon: Sondra Come, MD;  Location: [facility];  Service: Urology;  Laterality: Bilateral;  . ESOPHAGOGASTRODUODENOSCOPY  12/30/2020   Procedure: ESOPHAGOGASTRODUODENOSCOPY (EGD);  Surgeon: Wyline Mood, MD;  Location: [facility] ENDOSCOPY;  Service: Gastroenterology;;  . ESOPHAGOGASTRODUODENOSCOPY N/A 09/17/2021   Procedure: ESOPHAGOGASTRODUODENOSCOPY (EGD);  Surgeon: Pasty Spillers, MD;  Location: [facility] ENDOSCOPY;  Service: Endoscopy;  Laterality: N/A;  . GIVENS CAPSULE STUDY N/A 02/02/2021   Procedure: GIVENS CAPSULE STUDY;  Surgeon: Wyline Mood, MD;  Location: [facility] ENDOSCOPY;  Service: Gastroenterology;  Laterality: N/A;  WILL NEED INTERPRETER ON WHEELS;  BROTHER WANTS TO TRANSLATE  . TEE WITHOUT CARDIOVERSION N/A 07/01/2017   Procedure:  TRANSESOPHAGEAL ECHOCARDIOGRAM (TEE);  Surgeon: Laurier Nancy, MD;  Location: [facility];  Service: Cardiovascular;  Laterality: N/A;  . TEE WITHOUT CARDIOVERSION N/A 07/05/2017   Procedure: TRANSESOPHAGEAL ECHOCARDIOGRAM (TEE);  Surgeon: Laurier Nancy, MD;  Location: [facility];  Service: Cardiovascular;  Laterality: N/A;  . TRANSURETHRAL RESECTION OF BLADDER TUMOR  N/A 08/07/2021   Procedure: TRANSURETHRAL RESECTION OF BLADDER TUMOR (TURBT);  Surgeon: Sondra Come, MD;  Location: [facility]S;  Service: Urology;  Laterality: N/A;  . URETEROSCOPY Left 08/07/2021   Procedure: DIAGNOSTIC URETEROSCOPY;  Surgeon: Sondra Come, MD;  Location: [facility];  Service: Urology;  Laterality: Left;     Current Outpatient Medications  Medication Sig Dispense Refill  . carvedilol (COREG) 6.25 MG tablet Take 1 tablet (6.25 mg total) by mouth 2 (two) times daily. 60 tablet 11  . amiodarone (PACERONE) 200 MG tablet Take 0.5 tablets (100 mg total) by mouth daily. Home med 45 tablet 2  . atorvastatin (LIPITOR) 80 MG tablet Take 1 tablet (80 mg total) by mouth daily. 90 tablet 3  . ferrous sulfate (FERROUSUL) 325 (65 FE) MG tablet Take 1 tablet (325 mg total) by mouth daily with breakfast. Take 1 tablet twice a day every other day for 90 days. 30 tablet 2  . folic acid (FOLVITE) 1 MG tablet Take 1 tablet (1 mg total) by mouth every morning. 90 tablet 1  . levothyroxine (SYNTHROID) 88 MCG tablet Take 88 mcg by mouth daily before breakfast.    . Levothyroxine Sodium 88 MCG CAPS Take 88 mcg by mouth daily.    Marland Kitchen losartan (COZAAR) 25 MG tablet Take 1 tablet (25 mg total) by mouth daily. 90 tablet 3  . meclizine (ANTIVERT) 25 MG tablet Take 1 tablet (25 mg total) by mouth 3 (three) times daily as needed. 135 tablet 1  . metFORMIN (GLUCOPHAGE-XR) 500 MG 24 hr tablet Take 500 mg by mouth daily.    . pantoprazole (PROTONIX) 20 MG tablet TAKE 1 TABLET BY MOUTH EVERY DAY 90 tablet 1  . torsemide (DEMADEX) 10 MG tablet TAKE 1 TABLET BY MOUTH TWICE DAILY 180 tablet 1   No current facility-administered medications for this visit.    Allergies:   Lorazepam and Pork-derived products    Social History:   reports that he has quit smoking. His smoking use included cigarettes. He has quit using smokeless tobacco.  His smokeless tobacco use included chew. He reports that he does not drink  alcohol and does not use drugs.   Family History:  family history includes Diabetes in his brother and sister; Hypertension in his mother.    ROS:     ROS    All other systems are reviewed and negative.    PHYSICAL EXAM: VS:  BP 107/61   Pulse 73   Ht 5\' 5"  (1.651 m)   Wt 139 lb 3.2 oz (63.1 kg)   SpO2 98%   BMI 23.16 kg/m  , BMI Body mass index is 23.16 kg/m. Last weight:  Wt Readings from Last 3 Encounters:  05/19/23 139 lb 3.2 oz (63.1 kg)  03/03/23 135 lb (61.2 kg)  02/22/23 135 lb 9.6 oz (61.5 kg)     Physical Exam    EKG:   Recent Labs: No results found for requested labs within last 365 days.    Lipid Panel No results found for: "CHOL", "TRIG", "HDL", "CHOLHDL", "VLDL", "LDLCALC", "LDLDIRECT"    Other studies Reviewed: Additional studies/ records that were reviewed today include:  Review of the above records demonstrates:       No data to display            ASSESSMENT AND PLAN:    ICD-10-CM   1. Abdominal aortic atherosclerosis (HCC)  I70.0 carvedilol (COREG) 6.25 MG tablet    2. Acute on chronic combined systolic and diastolic congestive heart failure (HCC)  I50.43 carvedilol (COREG) 6.25 MG tablet   Go to coreg 6.25 bid    3. Acute on chronic combined systolic and diastolic CHF (congestive heart failure) (HCC)  I50.43 carvedilol (COREG) 6.25 MG tablet    4. Ischemic cardiomyopathy  I25.5 carvedilol (COREG) 6.25 MG tablet    5. PAF (paroxysmal atrial fibrillation) (HCC)  I48.0 carvedilol (COREG) 6.25 MG tablet    6. Diabetes mellitus without complication (HCC)  E11.9 carvedilol (COREG) 6.25 MG tablet    7. Acute renal failure superimposed on stage 3a chronic kidney disease, unspecified acute renal failure type (HCC)  N17.9 carvedilol (COREG) 6.25 MG tablet   N18.31        Problem List Items Addressed This Visit       Cardiovascular and Mediastinum   PAF (paroxysmal atrial fibrillation) (HCC)   Relevant Medications   carvedilol  (COREG) 6.25 MG tablet   Acute on chronic combined systolic and diastolic CHF (congestive heart failure) (HCC)   Relevant Medications   carvedilol (COREG) 6.25 MG tablet   Ischemic cardiomyopathy   Relevant Medications   carvedilol (COREG) 6.25 MG tablet   Acute exacerbation of CHF (congestive heart failure) (HCC)   Relevant Medications   carvedilol (COREG) 6.25 MG tablet   Abdominal aortic atherosclerosis (HCC) - Primary   Relevant Medications   carvedilol (COREG) 6.25 MG tablet     Endocrine   Diabetes mellitus without complication (HCC)   Relevant Medications   carvedilol (COREG) 6.25 MG tablet     Genitourinary   Acute renal failure superimposed on stage 3a chronic kidney disease (HCC)   Relevant Medications   carvedilol (COREG) 6.25 MG tablet       Disposition:   Return in about 2 weeks (around 06/02/2023) for Defibrilator check and f/u 2 weeks.    Total time spent: 35 minutes  Signed,  Adrian Blackwater, MD  05/19/2023 9:42 AM    [facility]

## 2023-05-26 ENCOUNTER — Ambulatory Visit (INDEPENDENT_AMBULATORY_CARE_PROVIDER_SITE_OTHER): Payer: Medicare Other | Admitting: Urology

## 2023-05-26 VITALS — BP 116/69 | HR 72

## 2023-05-26 DIAGNOSIS — R310 Gross hematuria: Secondary | ICD-10-CM | POA: Diagnosis not present

## 2023-05-26 DIAGNOSIS — C679 Malignant neoplasm of bladder, unspecified: Secondary | ICD-10-CM | POA: Diagnosis not present

## 2023-05-26 MED ORDER — SULFAMETHOXAZOLE-TRIMETHOPRIM 800-160 MG PO TABS: 1.0000 | ORAL_TABLET | Freq: Two times a day (BID) | ORAL | 0 refills | Status: DC

## 2023-05-26 NOTE — Progress Notes (Signed)
   05/26/2023 3:42 PM   Walter Blair September 05, 1946 [id number]  Reason for visit: Follow up bladder cancer  HPI: 77 year old comorbid male here with his brother today who provides the history.  Briefly he presented with gross hematuria and found to have a 3.5 cm bladder tumor and underwent a TURBT with gemcitabine on 08/07/2021, and pathology showed HG Ta disease.  All visible tumor was removed at that time.  With his comorbidities they opted to defer a second look TURBT and pursue induction BCG.  He never got induction BCG as he had multiple hospital admissions for CHF exacerbations as well as a GI bleed, and was actually transition to hospice care at that point.  His health is improved recently.  Previously, with his comorbidities and moved to hospice care they had deferred further surveillance cystoscopy.  Ultimately now that his health is improved, using shared decision making they opted to check a urine sample every 6 months, with the understanding that microscopic hematuria would prompt cystoscopy or CT urogram for further evaluation.  He denies any gross hematuria, however reports a few days of some burning with urination and pelvic discomfort.  Urinalysis today with pyuria with 11-30 WBCs and some bacteria.  Urine was sent for culture.  No microscopic blood today.  I recommended starting Bactrim DS twice daily x 10 days for possible UTI/prostatitis, encouraged to follow-up if no improvement after antibiotics.  Bactrim DS twice daily for possible UTI, follow-up urine culture RTC 6 months urinalysis, PVR, sooner if problems   Sondra Come, MD  [facility] [address] [place], [place] [address] [phone]

## 2023-05-27 LAB — URINALYSIS, COMPLETE
Bilirubin, UA: NEGATIVE
Glucose, UA: NEGATIVE
Ketones, UA: NEGATIVE
Nitrite, UA: NEGATIVE
Protein,UA: NEGATIVE
RBC, UA: NEGATIVE
Specific Gravity, UA: 1.01 (ref 1.005–1.030)
Urobilinogen, Ur: 0.2 mg/dL (ref 0.2–1.0)
pH, UA: 5.5 (ref 5.0–7.5)

## 2023-05-27 LAB — MICROSCOPIC EXAMINATION

## 2023-05-31 LAB — CULTURE, URINE COMPREHENSIVE

## 2023-06-06 ENCOUNTER — Ambulatory Visit: Payer: Medicare Other | Admitting: Cardiovascular Disease

## 2023-06-07 ENCOUNTER — Ambulatory Visit: Payer: Medicare Other | Admitting: Internal Medicine

## 2023-06-09 ENCOUNTER — Encounter: Payer: Self-pay | Admitting: Cardiovascular Disease

## 2023-06-09 ENCOUNTER — Ambulatory Visit (INDEPENDENT_AMBULATORY_CARE_PROVIDER_SITE_OTHER): Payer: Medicare Other | Admitting: Cardiovascular Disease

## 2023-06-09 VITALS — BP 93/62 | HR 67 | Ht 65.0 in | Wt 135.8 lb

## 2023-06-09 DIAGNOSIS — I255 Ischemic cardiomyopathy: Secondary | ICD-10-CM

## 2023-06-09 DIAGNOSIS — I480 Paroxysmal atrial fibrillation: Secondary | ICD-10-CM

## 2023-06-09 DIAGNOSIS — I2510 Atherosclerotic heart disease of native coronary artery without angina pectoris: Secondary | ICD-10-CM

## 2023-06-09 DIAGNOSIS — I159 Secondary hypertension, unspecified: Secondary | ICD-10-CM | POA: Diagnosis not present

## 2023-06-09 DIAGNOSIS — I2583 Coronary atherosclerosis due to lipid rich plaque: Secondary | ICD-10-CM

## 2023-06-09 MED ORDER — TORSEMIDE 10 MG PO TABS: 10.0000 mg | ORAL_TABLET | Freq: Every day | ORAL | 11 refills | Status: DC

## 2023-06-09 NOTE — Progress Notes (Signed)
Cardiology Office Note   Date:  06/09/2023   ID:  [name], [name] 06/20/1946, MRN [id number]  PCP:  Sherron Monday, MD  Cardiologist:  Adrian Blackwater, MD      History of Present Illness: Walter Blair is a 77 y.o. male who presents for  Chief Complaint  Patient presents with   Follow-up    Feeling fine. AICD needs changed as battery is close to reaching.      Past Medical History:  Diagnosis Date   AICD (automatic cardioverter/defibrillator) present    Aortic atherosclerosis (HCC)    Atrial fibrillation (HCC)    a.) CHA2DS2-VASc = 7 (age x 2, CHF, CVA x 2, aortic plaque, T2DM). b.) rate/rhythm maintained on oral amiodarone; chronically anticoagulated on rivaroxaban. 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 b.) TTE 07/25/2021: EF 20-25%; global HK; G3DD; severely reduced RV function; Severe BAR; mod-severe MR, severe TR, mild AR.   HLD (hyperlipidemia)    HTN (hypertension)    Hypothyroidism    IDA (iron deficiency anemia)    Ischemic cardiomyopathy    a.) TTE 06/29/2017: EF 10-15%. b.) TTE 07/25/2021: ED 20-25%.   Left-sided cerebrovascular accident (CVA) (HCC) 2004   a.) residual weakness in LEFT hand   Long term current use of anticoagulant    a.) rivaroxaban   S/P CABG x 4 2003   Performed in [place]   T2DM (type 2 diabetes mellitus) (HCC)    Thrombocytopenia (HCC)    possibly associated with underlying DIC     Past Surgical History:  Procedure Laterality Date   BLADDER INSTILLATION N/A 08/07/2021   Procedure: BLADDER  INSTILLATION OF GEMCITABINE;  Surgeon: Sondra Come, MD;  Location: [facility] ORS;  Service: Urology;  Laterality: N/A;   CARDIAC DEFIBRILLATOR PLACEMENT     CARDIOVERSION N/A 07/01/2017   Procedure: CARDIOVERSION;  Surgeon: Laurier Nancy, MD;  Location: [facility] OR[facility];  Service: Cardiovascular;  Laterality: N/A;   CARDIOVERSION N/A 07/05/2017   Procedure: CARDIOVERSION;  Surgeon: Laurier Nancy, MD;  Location: [facility];  Service: Cardiovascular;  Laterality: N/A;   COLONOSCOPY WITH PROPOFOL N/A 12/30/2020   Procedure: COLONOSCOPY WITH PROPOFOL;  Surgeon: Wyline Mood, MD;  Location: [facility] ENDOSCOPY;  Service: Gastroenterology;  Laterality: N/A;   CORONARY ARTERY BYPASS GRAFT N/A 2003   Procedure: Four-vessel CABG performed in [place]   CYSTOSCOPY W/ RETROGRADES Bilateral 08/07/2021   Procedure: CYSTOSCOPY WITH RETROGRADE PYELOGRAM;  Surgeon: Sondra Come, MD;  Location: [facility];  Service: Urology;  Laterality: Bilateral;   ESOPHAGOGASTRODUODENOSCOPY  12/30/2020   Procedure: ESOPHAGOGASTRODUODENOSCOPY (EGD);  Surgeon: Wyline Mood, MD;  Location: [facility] ENDOSCOPY;  Service: Gastroenterology;;   ESOPHAGOGASTRODUODENOSCOPY N/A 09/17/2021   Procedure: ESOPHAGOGASTRODUODENOSCOPY (EGD);  Surgeon: Pasty Spillers, MD;  Location: [facility] ENDOSCOPY;  Service: Endoscopy;  Laterality: N/A;   GIVENS CAPSULE STUDY N/A 02/02/2021   Procedure: GIVENS CAPSULE STUDY;  Surgeon: Wyline Mood, MD;  Location: [facility] END[facility]COPY;  Service: Gastroenterology;  Laterality: N/A;  WILL NEED INTERPRETER ON WHEELS;  BROTHER WANTS  TO TRANSLATE   TEE WITHOUT CARDIOVERSION N/A 07/01/2017   Procedure: TRANSESOPHAGEAL ECHOCARDIOGRAM (TEE);  Surgeon: Laurier Nancy, MD;  Location: [facility] ORS;  Service: Cardiovascular;  Laterality: N/A;   TEE WITHOUT CARDIOVERSION N/A 07/05/2017   Procedure: TRANSESOPHAGEAL ECHOCARDIOGRAM (TEE);  Surgeon: Laurier Nancy, MD;  Location: [facility] ORS;  Service: Cardiovascular;  Laterality: N/A;    TRANSURETHRAL RESECTION OF BLADDER TUMOR N/A 08/07/2021   Procedure: TRANSURETHRAL RESECTION OF BLADDER TUMOR (TURBT);  Surgeon: Sondra Come, MD;  Location: [facility] ORS;  Service: Urology;  Laterality: N/A;   URETEROSCOPY Left 08/07/2021   Procedure: DIAGNOSTIC URETEROSCOPY;  Surgeon: Sondra Come, MD;  Location: [facility] ORS;  Service: Urology;  Laterality: Left;     Current Outpatient Medications  Medication Sig Dispense Refill   amiodarone (PACERONE) 200 MG tablet Take 0.5 tablets (100 mg total) by mouth daily. Home med 45 tablet 2   atorvastatin (LIPITOR) 80 MG tablet Take 1 tablet (80 mg total) by mouth daily. 90 tablet 3   carvedilol (COREG) 6.25 MG tablet Take 1 tablet (6.25 mg total) by mouth 2 (two) times daily. 60 tablet 11   folic acid (FOLVITE) 1 MG tablet Take 1 tablet (1 mg total) by mouth every morning. 90 tablet 1   levothyroxine (SYNTHROID) 88 MCG tablet Take 88 mcg by mouth daily before breakfast.     losartan (COZAAR) 25 MG tablet Take 1 tablet (25 mg total) by mouth daily. 90 tablet 3   meclizine (ANTIVERT) 25 MG tablet Take 25 mg by mouth 3 (three) times daily as needed for dizziness.     metFORMIN (GLUCOPHAGE-XR) 500 MG 24 hr tablet Take 500 mg by mouth daily.     pantoprazole (PROTONIX) 20 MG tablet TAKE 1 TABLET BY MOUTH EVERY DAY 90 tablet 1   sulfamethoxazole-trimethoprim (BACTRIM DS) 800-160 MG tablet Take 1 tablet by mouth 2 (two) times daily. 20 tablet 0   torsemide (DEMADEX) 10 MG tablet TAKE 1 TABLET BY MOUTH TWICE DAILY 180 tablet 1   No current facility-administered medications for this visit.    Allergies:   Lorazepam and Pork-derived products    Social History:   reports that he has quit smoking. His smoking use included cigarettes. He has quit using smokeless tobacco.  His smokeless tobacco use included chew. He reports that he does not drink alcohol and does not use drugs.   Family History:  family history includes Diabetes in his brother and  sister; Hypertension in his mother.    ROS:     Review of Systems  Constitutional: Negative.   HENT: Negative.    Eyes: Negative.   Respiratory: Negative.    Gastrointestinal: Negative.   Genitourinary: Negative.   Musculoskeletal: Negative.   Skin: Negative.   Neurological: Negative.   Endo/Heme/Allergies: Negative.   Psychiatric/Behavioral: Negative.    All other systems reviewed and are negative.     All other systems are reviewed and negative.    PHYSICAL EXAM: VS:  BP 93/62   Pulse 67   Ht 5\' 5"  (1.651 m)   Wt 135 lb 12.8 oz (61.6 kg)   SpO2 100%   BMI 22.60 kg/m  , BMI Body mass index is 22.6 kg/m. Last weight:  Wt Readings from Last 3 Encounters:  06/09/23 135 lb 12.8 oz (61.6 kg)  05/19/23 139 lb 3.2 oz (63.1 kg)  03/03/23 135 lb (61.2 kg)     Physical Exam Vitals reviewed.  Constitutional:      Appearance: Normal appearance.  He is normal weight.  HENT:     Head: Normocephalic.     Nose: Nose normal.     Mouth/Throat:     Mouth: Mucous membranes are moist.  Eyes:     Pupils: Pupils are equal, round, and reactive to light.  Cardiovascular:     Rate and Rhythm: Normal rate and regular rhythm.     Pulses: Normal pulses.     Heart sounds: Normal heart sounds.  Pulmonary:     Effort: Pulmonary effort is normal.  Abdominal:     General: Abdomen is flat. Bowel sounds are normal.  Musculoskeletal:        General: Normal range of motion.     Cervical back: Normal range of motion.  Skin:    General: Skin is warm.  Neurological:     General: No focal deficit present.     Mental Status: He is alert.  Psychiatric:        Mood and Affect: Mood normal.       EKG:   Recent Labs: No results found for requested labs within last 365 days.    Lipid Panel No results found for: "CHOL", "TRIG", "HDL", "CHOLHDL", "VLDL", "LDLCALC", "LDLDIRECT"    Other studies Reviewed: Additional studies/ records that were reviewed today include:  Review of the  above records demonstrates:       No data to display            ASSESSMENT AND PLAN:    ICD-10-CM   1. PAF (paroxysmal atrial fibrillation) (HCC)  I48.0    Remains in NSR    2. Ischemic cardiomyopathy  I25.5    Severe LV dysfunction, get AICD at 1.9 years. 1.9-5 years, will consider appoitment at 8-9 month.    3. Secondary hypertension  I15.9     4. Coronary artery disease due to lipid rich plaque  I25.10    I25.83        Problem List Items Addressed This Visit       Cardiovascular and Mediastinum   PAF (paroxysmal atrial fibrillation) (HCC) - Primary   HTN (hypertension)   CAD (coronary artery disease)   Ischemic cardiomyopathy       Disposition:   Return in about 3 months (around 09/08/2023).    Total time spent: 30 minutes  Signed,  Adrian Blackwater, MD  06/09/2023 10:53 AM    [facility]

## 2023-06-10 ENCOUNTER — Encounter: Payer: Self-pay | Admitting: Cardiovascular Disease

## 2023-06-15 ENCOUNTER — Ambulatory Visit: Payer: Medicare Other | Admitting: Urology

## 2023-07-25 ENCOUNTER — Ambulatory Visit: Payer: Medicare Other | Admitting: Internal Medicine

## 2023-08-01 ENCOUNTER — Ambulatory Visit (INDEPENDENT_AMBULATORY_CARE_PROVIDER_SITE_OTHER): Payer: Medicare Other | Admitting: Internal Medicine

## 2023-08-01 ENCOUNTER — Encounter: Payer: Self-pay | Admitting: Internal Medicine

## 2023-08-01 VITALS — BP 108/72 | HR 70 | Ht 65.0 in | Wt 139.6 lb

## 2023-08-01 DIAGNOSIS — M25572 Pain in left ankle and joints of left foot: Secondary | ICD-10-CM | POA: Insufficient documentation

## 2023-08-01 DIAGNOSIS — Z1331 Encounter for screening for depression: Secondary | ICD-10-CM

## 2023-08-01 DIAGNOSIS — I2510 Atherosclerotic heart disease of native coronary artery without angina pectoris: Secondary | ICD-10-CM | POA: Diagnosis not present

## 2023-08-01 DIAGNOSIS — I2583 Coronary atherosclerosis due to lipid rich plaque: Secondary | ICD-10-CM

## 2023-08-01 DIAGNOSIS — N1831 Chronic kidney disease, stage 3a: Secondary | ICD-10-CM

## 2023-08-01 DIAGNOSIS — E1122 Type 2 diabetes mellitus with diabetic chronic kidney disease: Secondary | ICD-10-CM

## 2023-08-01 LAB — POCT CBG (FASTING - GLUCOSE)-MANUAL ENTRY: Glucose Fasting, POC: 169 mg/dL — AB (ref 70–99)

## 2023-08-01 LAB — POC CREATINE & ALBUMIN,URINE
Albumin/Creatinine Ratio, Urine, POC: <30
Creatinine, POC: 10 mg/dL
Microalbumin Ur, POC: 10 mg/L

## 2023-08-01 NOTE — Progress Notes (Signed)
Established Patient Office Visit  Subjective:  Patient ID: Walter Blair, male    DOB: Oct 29, 1945  Age: 77 y.o. MRN: [id number]  No chief complaint on file.   No new complaints, here for AWV refer to quality metrics and scanned documents.      No other concerns at this time.   Past Medical History:  Diagnosis Date   AICD (automatic cardioverter/defibrillator) present    Aortic atherosclerosis (HCC)    Atrial fibrillation (HCC)    a.) CHA2DS2-VASc = 7 (age x 2, CHF, CVA x 2, aortic plaque, T2DM). b.) rate/rhythm maintained on oral amiodarone; chronically anticoagulated on rivaroxaban. 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 b.) TTE 07/25/2021: EF 20-25%; global HK; G3DD; severely reduced RV function; Severe BAR; mod-severe MR, severe TR, mild AR.   HLD (hyperlipidemia)    HTN (hypertension)    Hypothyroidism    IDA (iron deficiency anemia)    Ischemic cardiomyopathy    a.) TTE 06/29/2017: EF 10-15%. b.) TTE 07/25/2021: ED 20-25%.   Left-sided cerebrovascular accident (CVA) (HCC) 2004   a.) residual weakness in LEFT hand   Long term current use of anticoagulant    a.) rivaroxaban   Homero Hyson/P CABG x 4 2003   Performed in [place]   T2DM (type 2 diabetes mellitus) (HCC)    Thrombocytopenia (HCC)    possibly associated with underlying DIC    Past Surgical History:  Procedure Laterality Date   BLADDER INSTILLATION N/A 08/07/2021   Procedure: BLADDER INSTILLATION OF GEMCITABINE;  Surgeon: Sondra Come, MD;  Location: [facility] ORS;  Service: Urology;   Laterality: N/A;   CARDIAC DEFIBRILLATOR PLACEMENT     CARDIOVERSION N/A 07/01/2017   Procedure: CARDIOVERSION;  Surgeon: Laurier Nancy, MD;  Location: [facility] ORS;  Service: Cardiovascular;  Laterality: N/A;   CARDIOVERSION N/A 07/05/2017   Procedure: CARDIOVERSION;  Surgeon: Laurier Nancy, MD;  Location: [facility] ORS;  Service: Cardiovascular;  Laterality: N/A;   COLONOSCOPY WITH PROPOFOL N/A 12/30/2020   Procedure: COLONOSCOPY WITH PROPOFOL;  Surgeon: Wyline Mood, MD;  Location: [facility] ENDOSCOPY;  Service: Gastroenterology;  Laterality: N/A;   CORONARY ARTERY BYPASS GRAFT N/A 2003   Procedure: Four-vessel CABG performed in [place]   CYSTOSCOPY W/ RETROGRADES Bilateral 08/07/2021   Procedure: CYSTOSCOPY WITH RETROGRADE PYELOGRAM;  Surgeon: Sondra Come, MD;  Location: [facility] ORS;  Service: Urology;  Laterality: Bilateral;   ESOPHAGOGASTRODUODENOSCOPY  12/30/2020   Procedure: ESOPHAGOGASTRODUODENOSCOPY (EGD);  Surgeon: Wyline Mood, MD;  Location: [facility]COPY;  Service: Gastroenterology;;   ESOPHAGOGASTRODUODENOSCOPY N/A 09/17/2021   Procedure: ESOPHAGOGASTRODUODENOSCOPY (EGD);  Surgeon: Pasty Spillers, MD;  Location: [facility] ENDOSCOPY;  Service: Endoscopy;  Laterality: N/A;   GIVENS CAPSULE STUDY N/A 02/02/2021   Procedure: GIVENS CAPSULE STUDY;  Surgeon: Wyline Mood, MD;  Location: [facility] ENDOSCOPY;  Service: Gastroenterology;  Laterality: N/A;  WILL NEED INTERPRETER ON WHEELS;  BROTHER WANTS TO TRANSLATE   TEE WITHOUT CARDIOVERSION N/A 07/01/2017   Procedure: TRANSESOPHAGEAL ECHOCARDIOGRAM (TEE);  Surgeon: Laurier Nancy, MD;  Location: [facility]  ORS;  Service: Cardiovascular;  Laterality: N/A;   TEE WITHOUT CARDIOVERSION N/A 07/05/2017   Procedure: TRANSESOPHAGEAL ECHOCARDIOGRAM (TEE);  Surgeon: Laurier Nancy, MD;  Location: [facility] ORS;  Service: Cardiovascular;  Laterality: N/A;   TRANSURETHRAL RESECTION OF BLADDER TUMOR N/A 08/07/2021   Procedure: TRANSURETHRAL RESECTION OF BLADDER  TUMOR (TURBT);  Surgeon: Sondra Come, MD;  Location: [facility] ORS;  Service: Urology;  Laterality: N/A;   URETEROSCOPY Left 08/07/2021   Procedure: DIAGNOSTIC URETEROSCOPY;  Surgeon: Sondra Come, MD;  Location: [facility] ORS;  Service: Urology;  Laterality: Left;    Social History   Socioeconomic History   Marital status: Widowed    Spouse name: Not on file   Number of children: Not on file   Years of education: Not on file   Highest education level: Not on file  Occupational History   Not on file  Tobacco Use   Smoking status: Former    Types: Cigarettes   Smokeless tobacco: Former    Types: Chew  Substance and Sexual Activity   Alcohol use: No   Drug use: No   Sexual activity: Not Currently  Other Topics Concern   Not on file  Social History Narrative   Lives with brother, POA   Social Determinants of Health   Financial Resource Strain: Not on file  Food Insecurity: Not on file  Transportation Needs: Not on file  Physical Activity: Not on file  Stress: Not on file  Social Connections: Not on file  Intimate Partner Violence: Not on file    Family History  Problem Relation Age of Onset   Diabetes Brother    Hypertension Mother    Diabetes Sister     Allergies  Allergen Reactions   Lorazepam Shortness Of Breath and Other (See Comments)    Pt experienced adverse reaction and was transferred to ICU last time they were given Med Other reaction(Onelia Cadmus): Other (See Comments) Pt experienced adverse reaction and was transferred to ICU last time they were given Med   Pork-Derived Products Other (See Comments)    Cultural reasons    Review of Systems  Constitutional: Negative.  Negative for weight loss (Gained weight).  HENT: Negative.    Eyes: Negative.   Respiratory: Negative.  Negative for shortness of breath.   Cardiovascular:  Negative for chest pain and orthopnea.  Gastrointestinal: Negative.   Genitourinary: Negative.   Skin: Negative.   Neurological:  Negative.   Endo/Heme/Allergies: Negative.        Objective:   BP 108/72   Pulse 70   Ht 5\' 5"  (1.651 m)   Wt 139 lb 9.6 oz (63.3 kg)   SpO2 98%   BMI 23.23 kg/m   Vitals:   08/01/23 1315  BP: 108/72  Pulse: 70  Height: 5\' 5"  (1.651 m)  Weight: 139 lb 9.6 oz (63.3 kg)  SpO2: 98%  BMI (Calculated): 23.23    Physical Exam Vitals reviewed.  Constitutional:      Appearance: Normal appearance. He is well-developed.     Comments: Asthenic build  HENT:     Head: Normocephalic.     Left Ear: There is no impacted cerumen.     Nose: Nose normal.     Mouth/Throat:     Mouth: Mucous membranes are moist.     Pharynx: No posterior oropharyngeal erythema.  Eyes:     Extraocular Movements: Extraocular movements intact.     Pupils: Pupils are equal, round, and reactive to light.  Cardiovascular:  Rate and Rhythm: Regular rhythm.     Chest Wall: PMI is not displaced.     Pulses: Normal pulses.     Heart sounds: Normal heart sounds. No murmur heard. Pulmonary:     Effort: Pulmonary effort is normal.     Breath sounds: Normal air entry. No rhonchi or rales.  Abdominal:     General: Abdomen is flat. Bowel sounds are normal. There is no distension.     Palpations: Abdomen is soft. There is no hepatomegaly, splenomegaly or mass.     Tenderness: There is no abdominal tenderness.  Musculoskeletal:        General: Normal range of motion.     Cervical back: Normal range of motion and neck supple.     Right lower leg: No edema.     Left lower leg: No edema.  Skin:    General: Skin is warm and dry.  Neurological:     General: No focal deficit present.     Mental Status: He is alert and oriented to person, place, and time.     Cranial Nerves: No cranial nerve deficit.     Motor: No weakness.  Psychiatric:        Mood and Affect: Mood normal.        Behavior: Behavior normal.      Results for orders placed or performed in visit on 08/01/23  POCT CBG (Fasting - Glucose)   Result Value Ref Range   Glucose Fasting, POC 169 (A) 70 - 99 mg/dL        Assessment & Plan:  As per problem list  Problem List Items Addressed This Visit       Cardiovascular and Mediastinum   CAD (coronary artery disease)     Endocrine   Type II diabetes mellitus with renal manifestations (HCC) - Primary   Relevant Orders   POCT CBG (Fasting - Glucose) (Completed)   POC CREATINE & ALBUMIN,URINE     Other   Left ankle pain   Relevant Orders   DG Ankle Complete Left    Return in about 4 months (around 11/29/2023) for fu with labs prior.   Total time spent: 35 minutes  Luna Fuse, MD  08/01/2023   This document may have been prepared by [facility] Voice Recognition software and as such may include unintentional dictation errors.

## 2023-08-05 ENCOUNTER — Other Ambulatory Visit: Payer: Self-pay | Admitting: Internal Medicine

## 2023-09-09 ENCOUNTER — Ambulatory Visit (INDEPENDENT_AMBULATORY_CARE_PROVIDER_SITE_OTHER): Payer: Medicare Other | Admitting: Cardiovascular Disease

## 2023-09-09 ENCOUNTER — Encounter: Payer: Self-pay | Admitting: Cardiovascular Disease

## 2023-09-09 VITALS — BP 100/56 | HR 85 | Ht 66.0 in | Wt 144.2 lb

## 2023-09-09 DIAGNOSIS — I480 Paroxysmal atrial fibrillation: Secondary | ICD-10-CM

## 2023-09-09 DIAGNOSIS — R0602 Shortness of breath: Secondary | ICD-10-CM | POA: Diagnosis not present

## 2023-09-09 DIAGNOSIS — I482 Chronic atrial fibrillation, unspecified: Secondary | ICD-10-CM

## 2023-09-09 DIAGNOSIS — I255 Ischemic cardiomyopathy: Secondary | ICD-10-CM

## 2023-09-09 DIAGNOSIS — I5022 Chronic systolic (congestive) heart failure: Secondary | ICD-10-CM | POA: Diagnosis not present

## 2023-09-09 NOTE — Progress Notes (Signed)
Cardiology Office Note   Date:  09/09/2023   ID:  Murriel, Becken May 26, 1946, MRN [id number]  PCP:  Sherron Monday, MD  Cardiologist:  Adrian Blackwater, MD      History of Present Illness: Walter Blair is a 77 y.o. male who presents for  Chief Complaint  Patient presents with   Follow-up    3 month follow up    Has DOE      Past Medical History:  Diagnosis Date   AICD (automatic cardioverter/defibrillator) present    Aortic atherosclerosis (HCC)    Atrial fibrillation (HCC)    a.) CHA2DS2-VASc = 7 (age x 2, CHF, CVA x 2, aortic plaque, T2DM). b.) rate/rhythm maintained on oral amiodarone; chronically anticoagulated on rivaroxaban. 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 b.) TTE 07/25/2021: EF 20-25%; global HK; G3DD; severely reduced RV function; Severe BAR; mod-severe MR, severe TR, mild AR.   HLD (hyperlipidemia)    HTN (hypertension)    Hypothyroidism    IDA (iron deficiency anemia)    Ischemic cardiomyopathy    a.) TTE 06/29/2017: EF 10-15%. b.) TTE 07/25/2021: ED 20-25%.   Left-sided cerebrovascular accident (CVA) (HCC) 2004   a.) residual weakness in LEFT hand   Long term current use of anticoagulant    a.) rivaroxaban   S/P CABG x 4 2003   Performed in [place]   T2DM (type 2 diabetes mellitus) (HCC)    Thrombocytopenia (HCC)    possibly associated with underlying DIC     Past Surgical History:  Procedure Laterality Date   BLADDER INSTILLATION N/A 08/07/2021   Procedure: BLADDER INSTILLATION OF GEMCITABINE;  Surgeon:  Sondra Come, MD;  Location: [facility] ORS;  Service: Urology;  Laterality: N/A;   CARDIAC DEFIBRILLATOR PLACEMENT     CARDIOVERSION N/A 07/01/2017   Procedure: CARDIOVERSION;  Surgeon: Laurier Nancy, MD;  Location: [facility] ORS;  Service: Cardiovascular;  Laterality: N/A;   CARDIOVERSION N/A 07/05/2017   Procedure: CARDIOVERSION;  Surgeon: Laurier Nancy, MD;  Location: [facility] ORS;  Service: Cardiovascular;  Laterality: N/A;   COLONOSCOPY WITH PROPOFOL N/A 12/30/2020   Procedure: COLONOSCOPY WITH PROPOFOL;  Surgeon: Wyline Mood, MD;  Location: [facility] ENDOSCOPY;  Service: Gastroenterology;  Laterality: N/A;   CORONARY ARTERY BYPASS GRAFT N/A 2003   Procedure: Four-vessel CABG performed in [place]   CYSTOSCOPY W/ RETROGRADES Bilateral 08/07/2021   Procedure: CYSTOSCOPY WITH RETROGRADE PYELOGRAM;  Surgeon: Sondra Come, MD;  Location: [facility] ORS;  Service: Urology;  Laterality: Bilateral;   ESOPHAGOGASTRODUODENOSCOPY  12/30/2020   Procedure: ESOPHAGOGASTRODUODENOSCOPY (EGD);  Surgeon: Wyline Mood, MD;  Location: [facility] ENDOSCOPY;  Service: Gastroenterology;;   ESOPHAGOGASTRODUODENOSCOPY N/A 09/17/2021   Procedure: ESOPHAGOGASTRODUODENOSCOPY (EGD);  Surgeon: Pasty Spillers, MD;  Location: [facility] ENDOSCOPY;  Service: Endoscopy;  Laterality: N/A;   GIVENS CAPSULE STUDY N/A 02/02/2021   Procedure: GIVENS CAPSULE STUDY;  Surgeon: Wyline Mood, MD;  Location: [facility] ENDOSCOPY;  Service: Gastroenterology;  Laterality: N/A;  WILL NEED INTERPRETER ON WHEELS;  BROTHER WANTS TO TRANSLATE  TEE WITHOUT CARDIOVERSION N/A 07/01/2017   Procedure: TRANSESOPHAGEAL ECHOCARDIOGRAM (TEE);  Surgeon: Laurier Nancy, MD;  Location: [facility] ORS;  Service: Cardiovascular;  Laterality: N/A;   TEE WITHOUT CARDIOVERSION N/A 07/05/2017   Procedure: TRANSESOPHAGEAL ECHOCARDIOGRAM (TEE);  Surgeon: Laurier Nancy, MD;  Location: [facility] ORS;  Service: Cardiovascular;  Laterality: N/A;   TRANSURETHRAL RESECTION OF BLADDER TUMOR N/A  08/07/2021   Procedure: TRANSURETHRAL RESECTION OF BLADDER TUMOR (TURBT);  Surgeon: Sondra Come, MD;  Location: [facility] ORS;  Service: Urology;  Laterality: N/A;   URETEROSCOPY Left 08/07/2021   Procedure: DIAGNOSTIC URETEROSCOPY;  Surgeon: Sondra Come, MD;  Location: [facility] ORS;  Service: Urology;  Laterality: Left;     Current Outpatient Medications  Medication Sig Dispense Refill   amiodarone (PACERONE) 200 MG tablet Take 0.5 tablets (100 mg total) by mouth daily. Home med 45 tablet 2   atorvastatin (LIPITOR) 80 MG tablet Take 1 tablet (80 mg total) by mouth daily. 90 tablet 3   carvedilol (COREG) 6.25 MG tablet Take 1 tablet (6.25 mg total) by mouth 2 (two) times daily. 60 tablet 11   folic acid (FOLVITE) 1 MG tablet Take 1 tablet (1 mg total) by mouth every morning. 90 tablet 1   levothyroxine (SYNTHROID) 88 MCG tablet Take 88 mcg by mouth daily before breakfast.     losartan (COZAAR) 25 MG tablet Take 1 tablet (25 mg total) by mouth daily. 90 tablet 3   meclizine (ANTIVERT) 25 MG tablet Take 25 mg by mouth 3 (three) times daily as needed for dizziness.     metFORMIN (GLUCOPHAGE-XR) 500 MG 24 hr tablet Take 500 mg by mouth daily.     pantoprazole (PROTONIX) 20 MG tablet TAKE 1 TABLET BY MOUTH EVERY DAY 90 tablet 1   torsemide (DEMADEX) 10 MG tablet Take 1 tablet (10 mg total) by mouth daily. 30 tablet 11   sulfamethoxazole-trimethoprim (BACTRIM DS) 800-160 MG tablet Take 1 tablet by mouth 2 (two) times daily. (Patient not taking: Reported on 09/09/2023) 20 tablet 0   No current facility-administered medications for this visit.    Allergies:   Lorazepam and Pork-derived products    Social History:   reports that he has quit smoking. His smoking use included cigarettes. He has quit using smokeless tobacco.  His smokeless tobacco use included chew. He reports that he does not drink alcohol and does not use drugs.   Family History:  family history includes Diabetes in his brother  and sister; Hypertension in his mother.    ROS:     Review of Systems  Constitutional: Negative.   HENT: Negative.    Eyes: Negative.   Respiratory: Negative.    Gastrointestinal: Negative.   Genitourinary: Negative.   Musculoskeletal: Negative.   Skin: Negative.   Neurological: Negative.   Endo/Heme/Allergies: Negative.   Psychiatric/Behavioral: Negative.    All other systems reviewed and are negative.     All other systems are reviewed and negative.    PHYSICAL EXAM: VS:  BP (!) 100/56   Pulse 85   Ht 5\' 6"  (1.676 m)   Wt 144 lb 3.2 oz (65.4 kg)   SpO2 98%   BMI 23.27 kg/m  , BMI Body mass index is 23.27 kg/m. Last weight:  Wt Readings from Last 3 Encounters:  09/09/23 144 lb 3.2 oz (65.4 kg)  08/01/23 139 lb 9.6 oz (63.3 kg)  06/09/23 135 lb 12.8 oz (61.6 kg)     Physical Exam Vitals reviewed.  Constitutional:  Appearance: Normal appearance. He is normal weight.  HENT:     Head: Normocephalic.     Nose: Nose normal.     Mouth/Throat:     Mouth: Mucous membranes are moist.  Eyes:     Pupils: Pupils are equal, round, and reactive to light.  Cardiovascular:     Rate and Rhythm: Normal rate and regular rhythm.     Pulses: Normal pulses.     Heart sounds: Normal heart sounds.  Pulmonary:     Effort: Pulmonary effort is normal.  Abdominal:     General: Abdomen is flat. Bowel sounds are normal.  Musculoskeletal:        General: Normal range of motion.     Cervical back: Normal range of motion.  Skin:    General: Skin is warm.  Neurological:     General: No focal deficit present.     Mental Status: He is alert.  Psychiatric:        Mood and Affect: Mood normal.       EKG:   Recent Labs: No results found for requested labs within last 365 days.    Lipid Panel No results found for: "CHOL", "TRIG", "HDL", "CHOLHDL", "VLDL", "LDLCALC", "LDLDIRECT"    Other studies Reviewed: Additional studies/ records that were reviewed today include:   Review of the above records demonstrates:       No data to display            ASSESSMENT AND PLAN:    ICD-10-CM   1. SOB (shortness of breath)  R06.02 PCV ECHOCARDIOGRAM COMPLETE   Has DOE, has dizziness standing up, so continue toresemide 10 once  daily    2. PAF (paroxysmal atrial fibrillation) (HCC)  I48.0 PCV ECHOCARDIOGRAM COMPLETE    3. Chronic systolic congestive heart failure (HCC)  I50.22 PCV ECHOCARDIOGRAM COMPLETE    4. Ischemic cardiomyopathy  I25.5 PCV ECHOCARDIOGRAM COMPLETE    5. Atrial fibrillation, chronic (HCC)  I48.20 PCV ECHOCARDIOGRAM COMPLETE       Problem List Items Addressed This Visit       Cardiovascular and Mediastinum   PAF (paroxysmal atrial fibrillation) (HCC)   Relevant Orders   PCV ECHOCARDIOGRAM COMPLETE   Atrial fibrillation, chronic (HCC)   Relevant Orders   PCV ECHOCARDIOGRAM COMPLETE   Ischemic cardiomyopathy   Relevant Orders   PCV ECHOCARDIOGRAM COMPLETE   Chronic systolic congestive heart failure (HCC)   Relevant Orders   PCV ECHOCARDIOGRAM COMPLETE   Other Visit Diagnoses       SOB (shortness of breath)    -  Primary   Has DOE, has dizziness standing up, so continue toresemide 10 once  daily   Relevant Orders   PCV ECHOCARDIOGRAM COMPLETE          Disposition:   Return in about 2 months (around 11/10/2023) for echo and f/u.    Total time spent: 30 minutes  Signed,  Adrian Blackwater, MD  09/09/2023 9:37 AM    [facility]

## 2023-11-03 ENCOUNTER — Ambulatory Visit: Payer: Medicare Other

## 2023-11-03 DIAGNOSIS — I5022 Chronic systolic (congestive) heart failure: Secondary | ICD-10-CM

## 2023-11-03 DIAGNOSIS — I371 Nonrheumatic pulmonary valve insufficiency: Secondary | ICD-10-CM | POA: Diagnosis not present

## 2023-11-03 DIAGNOSIS — I340 Nonrheumatic mitral (valve) insufficiency: Secondary | ICD-10-CM

## 2023-11-03 DIAGNOSIS — I361 Nonrheumatic tricuspid (valve) insufficiency: Secondary | ICD-10-CM

## 2023-11-03 DIAGNOSIS — R0602 Shortness of breath: Secondary | ICD-10-CM

## 2023-11-03 DIAGNOSIS — I480 Paroxysmal atrial fibrillation: Secondary | ICD-10-CM

## 2023-11-03 DIAGNOSIS — I255 Ischemic cardiomyopathy: Secondary | ICD-10-CM

## 2023-11-03 DIAGNOSIS — I351 Nonrheumatic aortic (valve) insufficiency: Secondary | ICD-10-CM

## 2023-11-03 DIAGNOSIS — I482 Chronic atrial fibrillation, unspecified: Secondary | ICD-10-CM

## 2023-11-04 ENCOUNTER — Other Ambulatory Visit: Payer: Self-pay | Admitting: Cardiovascular Disease

## 2023-11-04 ENCOUNTER — Ambulatory Visit: Payer: Medicare Other | Admitting: Cardiovascular Disease

## 2023-11-04 ENCOUNTER — Encounter: Payer: Self-pay | Admitting: Cardiovascular Disease

## 2023-11-04 VITALS — BP 105/68 | HR 76 | Ht 65.0 in | Wt 145.0 lb

## 2023-11-04 DIAGNOSIS — I2510 Atherosclerotic heart disease of native coronary artery without angina pectoris: Secondary | ICD-10-CM

## 2023-11-04 DIAGNOSIS — R0602 Shortness of breath: Secondary | ICD-10-CM

## 2023-11-04 DIAGNOSIS — I2583 Coronary atherosclerosis due to lipid rich plaque: Secondary | ICD-10-CM

## 2023-11-04 DIAGNOSIS — I351 Nonrheumatic aortic (valve) insufficiency: Secondary | ICD-10-CM

## 2023-11-04 DIAGNOSIS — I255 Ischemic cardiomyopathy: Secondary | ICD-10-CM | POA: Diagnosis not present

## 2023-11-04 DIAGNOSIS — I480 Paroxysmal atrial fibrillation: Secondary | ICD-10-CM

## 2023-11-04 DIAGNOSIS — I482 Chronic atrial fibrillation, unspecified: Secondary | ICD-10-CM

## 2023-11-04 DIAGNOSIS — I5022 Chronic systolic (congestive) heart failure: Secondary | ICD-10-CM | POA: Diagnosis not present

## 2023-11-04 DIAGNOSIS — I340 Nonrheumatic mitral (valve) insufficiency: Secondary | ICD-10-CM

## 2023-11-04 MED ORDER — FUROSEMIDE 20 MG PO TABS: 20.0000 mg | ORAL_TABLET | Freq: Every day | ORAL | 11 refills | Status: DC

## 2023-11-04 NOTE — Progress Notes (Addendum)
Cardiology Office Note   Date:  11/04/2023   ID:  [name], Sole Apr 23, 1946, MRN [id number]  PCP:  Sherron Monday, MD  Cardiologist:  Adrian Blackwater, MD      History of Present Illness: Walter Blair is a 78 y.o. male who presents for No chief complaint on file.   Doing well      Past Medical History:  Diagnosis Date   AICD (automatic cardioverter/defibrillator) present    Aortic atherosclerosis (HCC)    Atrial fibrillation (HCC)    a.) CHA2DS2-VASc = 7 (age x 2, CHF, CVA x 2, aortic plaque, T2DM). b.) rate/rhythm maintained on oral amiodarone; chronically anticoagulated on rivaroxaban. 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 b.) TTE 07/25/2021: EF 20-25%; global HK; G3DD; severely reduced RV function; Severe BAR; mod-severe MR, severe TR, mild AR.   HLD (hyperlipidemia)    HTN (hypertension)    Hypothyroidism    IDA (iron deficiency anemia)    Ischemic cardiomyopathy    a.) TTE 06/29/2017: EF 10-15%. b.) TTE 07/25/2021: ED 20-25%.   Left-sided cerebrovascular accident (CVA) (HCC) 2004   a.) residual weakness in LEFT hand   Long term current use of anticoagulant    a.) rivaroxaban   S/P CABG x 4 2003   Performed in [place]   T2DM (type 2 diabetes mellitus) (HCC)    Thrombocytopenia (HCC)    possibly associated with underlying DIC     Past Surgical History:  Procedure Laterality Date   BLADDER INSTILLATION N/A 08/07/2021   Procedure: BLADDER INSTILLATION OF GEMCITABINE;  Surgeon: Sondra Come, MD;  Location: [facility] ORS;   Service: Urology;  Laterality: N/A;   CARDIAC DEFIBRILLATOR PLACEMENT     CARDIOVERSION N/A 07/01/2017   Procedure: CARDIOVERSION;  Surgeon: Laurier Nancy, MD;  Location: [facility];  Service: Cardiovascular;  Laterality: N/A;   CARDIOVERSION N/A 07/05/2017   Procedure: CARDIOVERSION;  Surgeon: Laurier Nancy, MD;  Location: [facility];  Service: Cardiovascular;  Laterality: N/A;   COLONOSCOPY WITH PROPOFOL N/A 12/30/2020   Procedure: COLONOSCOPY WITH PROPOFOL;  Surgeon: Wyline Mood, MD;  Location: [facility] ENDOSCOPY;  Service: Gastroenterology;  Laterality: N/A;   CORONARY ARTERY BYPASS GRAFT N/A 2003   Procedure: Four-vessel CABG performed in [place]   CYSTOSCOPY W/ RETROGRADES Bilateral 08/07/2021   Procedure: CYSTOSCOPY WITH RETROGRADE PYELOGRAM;  Surgeon: Sondra Come, MD;  Location: [facility];  Service: Urology;  Laterality: Bilateral;   ESOPHAGOGASTRODUODENOSCOPY  12/30/2020   Procedure: ESOPHAGOGASTRODUODENOSCOPY (EGD);  Surgeon: Wyline Mood, MD;  Location: [facility] ENDOSCOPY;  Service: Gastroenterology;;   ESOPHAGOGASTRODUODENOSCOPY N/A 09/17/2021   Procedure: ESOPHAGOGASTRODUODENOSCOPY (EGD);  Surgeon: Pasty Spillers, MD;  Location: [facility] ENDOSCOPY;  Service: Endoscopy;  Laterality: N/A;   GIVENS CAPSULE STUDY N/A 02/02/2021   Procedure: GIVENS CAPSULE STUDY;  Surgeon: Wyline Mood, MD;  Location[facility] ENDOSCOPY;  Service: Gastroenterology;  Laterality: N/A;  WILL NEED INTERPRETER ON WHEELS;  BROTHER WANTS TO TRANSLATE   TEE WITHOUT CARDIOVERSION N/A 07/01/2017   Procedure: TRANSESOPHAGEAL ECHOCARDIOGRAM (TEE);  Surgeon: Laurier Nancy, MD;  Location: [facility];  Service: Cardiovascular;  Laterality: N/A;   TEE WITHOUT CARDIOVERSION N/A 07/05/2017   Procedure: TRANSESOPHAGEAL ECHOCARDIOGRAM (TEE);  Surgeon: Laurier Nancy, MD;  Location: [facility] ORS;  Service: Cardiovascular;  Laterality: N/A;   TRANSURETHRAL RESECTION OF BLADDER TUMOR N/A 08/07/2021   Procedure: TRANSURETHRAL  RESECTION OF BLADDER TUMOR (TURBT);  Surgeon: Sondra Come, MD;  Location: [facility] ORS;  Service: Urology;  Laterality: N/A;   URETEROSCOPY Left 08/07/2021   Procedure: DIAGNOSTIC URETEROSCOPY;  Surgeon: Sondra Come, MD;  Location: [facility] ORS;  Service: Urology;  Laterality: Left;     Current Outpatient Medications  Medication Sig Dispense Refill   furosemide (LASIX) 20 MG tablet Take 1 tablet (20 mg total) by mouth daily. 30 tablet 11   amiodarone (PACERONE) 200 MG tablet Take 0.5 tablets (100 mg total) by mouth daily. Home med 45 tablet 2   atorvastatin (LIPITOR) 80 MG tablet Take 1 tablet (80 mg total) by mouth daily. 90 tablet 3   carvedilol (COREG) 6.25 MG tablet Take 1 tablet (6.25 mg total) by mouth 2 (two) times daily. 60 tablet 11   folic acid (FOLVITE) 1 MG tablet Take 1 tablet (1 mg total) by mouth every morning. 90 tablet 1   levothyroxine (SYNTHROID) 88 MCG tablet Take 88 mcg by mouth daily before breakfast.     losartan (COZAAR) 25 MG tablet Take 1 tablet (25 mg total) by mouth daily. 90 tablet 3   meclizine (ANTIVERT) 25 MG tablet Take 25 mg by mouth 3 (three) times daily as needed for dizziness.     metFORMIN (GLUCOPHAGE-XR) 500 MG 24 hr tablet Take 500 mg by mouth daily.     pantoprazole (PROTONIX) 20 MG tablet TAKE 1 TABLET BY MOUTH EVERY DAY 90 tablet 1   sulfamethoxazole-trimethoprim (BACTRIM DS) 800-160 MG tablet Take 1 tablet by mouth 2 (two) times daily. (Patient not taking: Reported on 09/09/2023) 20 tablet 0   No current facility-administered medications for this visit.    Allergies:   Lorazepam and Pork-derived products    Social History:   reports that he has quit smoking. His smoking use included cigarettes. He has quit using smokeless tobacco.  His smokeless tobacco use included chew. He reports that he does not drink alcohol and does not use drugs.   Family History:  family history includes Diabetes in his brother and sister; Hypertension in his mother.     ROS:     Review of Systems  Constitutional: Negative.   HENT: Negative.    Eyes: Negative.   Respiratory: Negative.    Gastrointestinal: Negative.   Genitourinary: Negative.   Musculoskeletal: Negative.   Skin: Negative.   Neurological: Negative.   Endo/Heme/Allergies: Negative.   Psychiatric/Behavioral: Negative.    All other systems reviewed and are negative.     All other systems are reviewed and negative.    PHYSICAL EXAM: VS:  BP 105/68   Pulse 76   Ht 5\' 5"  (1.651 m)   Wt 145 lb (65.8 kg)   SpO2 99%   BMI 24.13 kg/m  , BMI Body mass index is 24.13 kg/m. Last weight:  Wt Readings from Last 3 Encounters:  11/04/23 145 lb (65.8 kg)  09/09/23 144 lb 3.2 oz (65.4 kg)  08/01/23 139 lb 9.6 oz (63.3 kg)     Physical Exam Vitals reviewed.  Constitutional:      Appearance: Normal appearance. He is normal weight.  HENT:  Head: Normocephalic.     Nose: Nose normal.     Mouth/Throat:     Mouth: Mucous membranes are moist.  Eyes:     Pupils: Pupils are equal, round, and reactive to light.  Cardiovascular:     Rate and Rhythm: Normal rate and regular rhythm.     Pulses: Normal pulses.     Heart sounds: Normal heart sounds.  Pulmonary:     Effort: Pulmonary effort is normal.  Abdominal:     General: Abdomen is flat. Bowel sounds are normal.  Musculoskeletal:        General: Normal range of motion.     Cervical back: Normal range of motion.  Skin:    General: Skin is warm.  Neurological:     General: No focal deficit present.     Mental Status: He is alert.  Psychiatric:        Mood and Affect: Mood normal.       EKG:   Recent Labs: No results found for requested labs within last 365 days.    Lipid Panel No results found for: "CHOL", "TRIG", "HDL", "CHOLHDL", "VLDL", "LDLCALC", "LDLDIRECT"    Other studies Reviewed: Additional studies/ records that were reviewed today include:  Review of the above records demonstrates:       No data  to display            ASSESSMENT AND PLAN:    ICD-10-CM   1. PAF (paroxysmal atrial fibrillation) (HCC)  I48.0 furosemide (LASIX) 20 MG tablet   doing well, in NSR    2. Chronic systolic congestive heart failure (HCC)  I50.22 furosemide (LASIX) 20 MG tablet    3. Atrial fibrillation, chronic (HCC)  I48.20 furosemide (LASIX) 20 MG tablet    4. Ischemic cardiomyopathy  I25.5 furosemide (LASIX) 20 MG tablet   30 % LVEF , but feeling fine    5. SOB (shortness of breath)  R06.02 furosemide (LASIX) 20 MG tablet    6. Coronary artery disease due to lipid rich plaque  I25.10 furosemide (LASIX) 20 MG tablet   I25.83     7. Nonrheumatic mitral valve regurgitation  I34.0 furosemide (LASIX) 20 MG tablet   moderate    8. Nonrheumatic aortic valve insufficiency  I35.1 furosemide (LASIX) 20 MG tablet   mild       Problem List Items Addressed This Visit       Cardiovascular and Mediastinum   PAF (paroxysmal atrial fibrillation) (HCC) - Primary   Relevant Medications   furosemide (LASIX) 20 MG tablet   CAD (coronary artery disease)   Relevant Medications   furosemide (LASIX) 20 MG tablet   Atrial fibrillation, chronic (HCC)   Relevant Medications   furosemide (LASIX) 20 MG tablet   Ischemic cardiomyopathy   Relevant Medications   furosemide (LASIX) 20 MG tablet   Chronic systolic congestive heart failure (HCC)   Relevant Medications   furosemide (LASIX) 20 MG tablet   Other Visit Diagnoses       SOB (shortness of breath)       Relevant Medications   furosemide (LASIX) 20 MG tablet     Nonrheumatic mitral valve regurgitation       moderate   Relevant Medications   furosemide (LASIX) 20 MG tablet     Nonrheumatic aortic valve insufficiency       mild   Relevant Medications   furosemide (LASIX) 20 MG tablet          Disposition:   Return  in about 2 months (around 01/02/2024).    Total time spent: 30 minutes  Signed,  Adrian Blackwater, MD  11/04/2023 10:12 AM     [facility]

## 2023-11-04 NOTE — Addendum Note (Signed)
Addended by: Adrian Blackwater A on: 11/04/2023 10:12 AM   Modules accepted: Orders

## 2023-11-08 ENCOUNTER — Ambulatory Visit: Payer: Medicare Other | Admitting: Cardiovascular Disease

## 2023-11-14 ENCOUNTER — Other Ambulatory Visit: Payer: Self-pay | Admitting: Cardiovascular Disease

## 2023-11-14 ENCOUNTER — Other Ambulatory Visit: Payer: Self-pay | Admitting: Internal Medicine

## 2023-11-17 ENCOUNTER — Encounter: Payer: Self-pay | Admitting: Urology

## 2023-11-17 ENCOUNTER — Ambulatory Visit: Payer: Self-pay | Admitting: Urology

## 2023-11-19 ENCOUNTER — Other Ambulatory Visit: Payer: Self-pay | Admitting: Internal Medicine

## 2023-11-29 ENCOUNTER — Encounter: Payer: Self-pay | Admitting: Internal Medicine

## 2023-11-29 ENCOUNTER — Ambulatory Visit (INDEPENDENT_AMBULATORY_CARE_PROVIDER_SITE_OTHER): Payer: Medicare Other | Admitting: Internal Medicine

## 2023-11-29 VITALS — BP 108/62 | HR 72 | Temp 98.4°F | Ht 65.0 in | Wt 140.0 lb

## 2023-11-29 DIAGNOSIS — E1122 Type 2 diabetes mellitus with diabetic chronic kidney disease: Secondary | ICD-10-CM

## 2023-11-29 DIAGNOSIS — N1831 Chronic kidney disease, stage 3a: Secondary | ICD-10-CM

## 2023-11-29 DIAGNOSIS — E1142 Type 2 diabetes mellitus with diabetic polyneuropathy: Secondary | ICD-10-CM

## 2023-11-29 DIAGNOSIS — I5022 Chronic systolic (congestive) heart failure: Secondary | ICD-10-CM | POA: Diagnosis not present

## 2023-11-29 DIAGNOSIS — Z0130 Encounter for examination of blood pressure without abnormal findings: Secondary | ICD-10-CM

## 2023-11-29 LAB — POC CREATINE & ALBUMIN,URINE
Albumin/Creatinine Ratio, Urine, POC: <30
Creatinine, POC: 50 mg/dL
Microalbumin Ur, POC: 10 mg/L

## 2023-11-29 LAB — POCT CBG (FASTING - GLUCOSE)-MANUAL ENTRY: Glucose Fasting, POC: 140 mg/dL — AB (ref 70–99)

## 2023-11-29 MED ORDER — GABAPENTIN 100 MG PO CAPS: 100.0000 mg | ORAL_CAPSULE | Freq: Every evening | ORAL | 2 refills | Status: DC

## 2023-11-29 NOTE — Progress Notes (Signed)
 Established Patient Office Visit  Subjective:  Patient ID: Walter Blair, male    DOB: 08/17/1946  Age: 78 y.o. MRN: [id number]  Chief Complaint  Patient presents with   Follow-up    4 month follow up     No new complaints, here for lab review and medication refills. C/o bilateral leg pain which occurs at night and is worse in his left leg. Pain can be quite severe and has been longstanding. XR knee was negative. Pain relieved by rubbing his legs.      No other concerns at this time.   Past Medical History:  Diagnosis Date   AICD (automatic cardioverter/defibrillator) present    Aortic atherosclerosis (HCC)    Atrial fibrillation (HCC)    a.) CHA2DS2-VASc = 7 (age x 2, CHF, CVA x 2, aortic plaque, T2DM). b.) rate/rhythm maintained on oral amiodarone; chronically anticoagulated on rivaroxaban. 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 b.) TTE 07/25/2021: EF 20-25%; global HK; G3DD; severely reduced RV function; Severe BAR; mod-severe MR, severe TR, mild AR.   HLD (hyperlipidemia)    HTN (hypertension)    Hypothyroidism    IDA (iron deficiency anemia)    Ischemic cardiomyopathy    a.) TTE 06/29/2017: EF 10-15%. b.) TTE 07/25/2021: ED 20-25%.   Left-sided cerebrovascular accident (CVA) (HCC) 2004   a.) residual weakness in LEFT hand   Long term current use of anticoagulant    a.) rivaroxaban   Maleek Craver/P CABG x 4 2003   Performed in [place]   T2DM (type 2 diabetes mellitus) (HCC)    Thrombocytopenia (HCC)    possibly associated with underlying DIC     Past Surgical History:  Procedure Laterality Date   BLADDER INSTILLATION N/A 08/07/2021   Procedure: BLADDER INSTILLATION OF GEMCITABINE;  Surgeon: Sondra Come, MD;  Location: [facility] ORS;  Service: Urology;  Laterality: N/A;   CARDIAC DEFIBRILLATOR PLACEMENT     CARDIOVERSION N/A 07/01/2017   Procedure: CARDIOVERSION;  Surgeon: Laurier Nancy, MD;  Location: [facility] ORS;  Service: Cardiovascular;  Laterality: N/A;   CARDIOVERSION N/A 07/05/2017   Procedure: CARDIOVERSION;  Surgeon: Laurier Nancy, MD;  Location: [facility] ORS;  Service: Cardiovascular;  Laterality: N/A;   COLONOSCOPY WITH PROPOFOL N/A 12/30/2020   Procedure: COLONOSCOPY WITH PROPOFOL;  Surgeon: Wyline Mood, MD;  Location: [facility] ENDOSCOPY;  Service: Gastroenterology;  Laterality: N/A;   CORONARY ARTERY BYPASS GRAFT N/A 2003   Procedure: Four-vessel CABG performed in [place]   CYSTOSCOPY W/ RETROGRADES Bilateral 08/07/2021   Procedure: CYSTOSCOPY WITH RETROGRADE PYELOGRAM;  Surgeon: Sondra Come, MD;  Location: [facility] ORS;  Service: Urology;  Laterality: Bilateral;   ESOPHAGOGASTRODUODENOSCOPY  12/30/2020   Procedure: ESOPHAGOGASTRODUODENOSCOPY (EGD);  Surgeon: Wyline Mood, MD;  Location: [facility] ENDOSCOPY;  Service: Gastroenterology;;   ESOPHAGOGASTRODUODENOSCOPY N/A 09/17/2021   Procedure: ESOPHAGOGASTRODUODENOSCOPY (EGD);  Surgeon: Pasty Spillers, MD;  Location: [facility] ENDOSCOPY;  Service: Endoscopy;  Laterality: N/A;   GIVENS CAPSULE STUDY N/A 02/02/2021   Procedure: GIVENS CAPSULE STUDY;  Surgeon: Wyline Mood,  MD;  Location: [facility] ENDOSCOPY;  Service: Gastroenterology;  Laterality: N/A;  WILL NEED INTERPRETER ON WHEELS;  BROTHER WANTS TO TRANSLATE   TEE WITHOUT CARDIOVERSION N/A 07/01/2017   Procedure: TRANSESOPHAGEAL ECHOCARDIOGRAM (TEE);  Surgeon: Laurier Nancy, MD;  Location: [facility] ORS;  Service: Cardiovascular;  Laterality: N/A;   TEE WITHOUT CARDIOVERSION N/A 07/05/2017   Procedure: TRANSESOPHAGEAL  ECHOCARDIOGRAM (TEE);  Surgeon: Laurier Nancy, MD;  Location: [facility] ORS;  Service: Cardiovascular;  Laterality: N/A;   TRANSURETHRAL RESECTION OF BLADDER TUMOR N/A 08/07/2021   Procedure: TRANSURETHRAL RESECTION OF BLADDER TUMOR (TURBT);  Surgeon: Sondra Come, MD;  Location: [facility] ORS;  Service: Urology;  Laterality: N/A;   URETEROSCOPY Left 08/07/2021   Procedure: DIAGNOSTIC URETEROSCOPY;  Surgeon: Sondra Come, MD;  Location: [facility] ORS;  Service: Urology;  Laterality: Left;    Social History   Socioeconomic History   Marital status: Widowed    Spouse name: Not on file   Number of children: Not on file   Years of education: Not on file   Highest education level: Not on file  Occupational History   Not on file  Tobacco Use   Smoking status: Former    Types: Cigarettes   Smokeless tobacco: Former    Types: Chew  Substance and Sexual Activity   Alcohol use: No   Drug use: No   Sexual activity: Not Currently  Other Topics Concern   Not on file  Social History Narrative   Lives with brother, POA   Social Drivers of Health   Financial Resource Strain: Not on file  Food Insecurity: Not on file  Transportation Needs: Not on file  Physical Activity: Not on file  Stress: Not on file  Social Connections: Not on file  Intimate Partner Violence: Not on file    Family History  Problem Relation Age of Onset   Diabetes Brother    Hypertension Mother    Diabetes Sister     Allergies  Allergen Reactions   Lorazepam Shortness Of Breath and Other (See Comments)    Pt experienced adverse reaction and was transferred to ICU last time they were given Med Other reaction(Anylah Scheib): Other (See Comments) Pt experienced adverse reaction and was transferred to ICU last time they were given Med   Pork-Derived Products Other (See Comments)    Cultural reasons    Outpatient Medications Prior to Visit  Medication Sig   amiodarone (PACERONE) 200 MG tablet TAKE 1/2 TABLET BY MOUTH EVERY  DAY   atorvastatin (LIPITOR) 80 MG tablet Take 1 tablet (80 mg total) by mouth daily.   carvedilol (COREG) 6.25 MG tablet Take 1 tablet (6.25 mg total) by mouth 2 (two) times daily.   folic acid (FOLVITE) 1 MG tablet Take 1 tablet (1 mg total) by mouth every morning.   furosemide (LASIX) 20 MG tablet Take 1 tablet (20 mg total) by mouth daily.   levothyroxine (SYNTHROID) 88 MCG tablet Take 88 mcg by mouth daily before breakfast.   losartan (COZAAR) 25 MG tablet TAKE 1 TABLET(25 MG) BY MOUTH DAILY   meclizine (ANTIVERT) 25 MG tablet TAKE 1 TABLET(25 MG) BY MOUTH THREE TIMES DAILY AS NEEDED   metFORMIN (GLUCOPHAGE-XR) 500 MG 24 hr tablet Take 500 mg by mouth daily.   pantoprazole (PROTONIX) 20 MG tablet TAKE 1 TABLET BY MOUTH EVERY DAY   sulfamethoxazole-trimethoprim (BACTRIM DS) 800-160 MG tablet Take 1 tablet by mouth 2 (two) times daily. (Patient not taking: Reported on 09/09/2023)   No facility-administered medications  prior to visit.    Review of Systems  Constitutional: Negative.  Negative for weight loss (Gained weight).  HENT: Negative.    Eyes: Negative.   Respiratory: Negative.  Negative for shortness of breath.   Cardiovascular:  Negative for chest pain and orthopnea.  Gastrointestinal: Negative.   Genitourinary: Negative.   Musculoskeletal:  Positive for myalgias.  Skin: Negative.   Neurological: Negative.   Endo/Heme/Allergies: Negative.        Objective:   BP 108/62   Pulse 72   Temp 98.4 F (36.9 C)   Ht 5\' 5"  (1.651 m)   Wt 140 lb (63.5 kg)   SpO2 99%   BMI 23.30 kg/m   Vitals:   11/29/23 0943  BP: 108/62  Pulse: 72  Temp: 98.4 F (36.9 C)  Height: 5\' 5"  (1.651 m)  Weight: 140 lb (63.5 kg)  SpO2: 99%  BMI (Calculated): 23.3    Physical Exam Vitals reviewed.  Constitutional:      Appearance: Normal appearance. He is well-developed.     Comments: Asthenic build  HENT:     Head: Normocephalic.     Left Ear: There is no impacted cerumen.      Nose: Nose normal.     Mouth/Throat:     Mouth: Mucous membranes are moist.     Pharynx: No posterior oropharyngeal erythema.  Eyes:     Extraocular Movements: Extraocular movements intact.     Pupils: Pupils are equal, round, and reactive to light.  Cardiovascular:     Rate and Rhythm: Regular rhythm.     Chest Wall: PMI is not displaced.     Pulses: Normal pulses.     Heart sounds: Normal heart sounds. No murmur heard. Pulmonary:     Effort: Pulmonary effort is normal.     Breath sounds: Normal air entry. No rhonchi or rales.  Abdominal:     General: Abdomen is flat. Bowel sounds are normal. There is no distension.     Palpations: Abdomen is soft. There is no hepatomegaly, splenomegaly or mass.     Tenderness: There is no abdominal tenderness.  Musculoskeletal:        General: Normal range of motion.     Cervical back: Normal range of motion and neck supple.     Right lower leg: No edema.     Left lower leg: No edema.  Skin:    General: Skin is warm and dry.  Neurological:     General: No focal deficit present.     Mental Status: He is alert and oriented to person, place, and time.     Cranial Nerves: No cranial nerve deficit.     Motor: No weakness.     Comments: Impaired vibration sensation BLE  Psychiatric:        Mood and Affect: Mood normal.        Behavior: Behavior normal.     Results for orders placed or performed in visit on 11/29/23  POCT CBG (Fasting - Glucose)  Result Value Ref Range   Glucose Fasting, POC 140 (A) 70 - 99 mg/dL    Recent Results (from the past 2160 hours)  POCT CBG (Fasting - Glucose)     Status: Abnormal   Collection Time: 11/29/23  9:48 AM  Result Value Ref Range   Glucose Fasting, POC 140 (A) 70 - 99 mg/dL      Assessment & Plan:   Problem List Items Addressed This Visit  Cardiovascular and Mediastinum   Chronic systolic congestive heart failure (HCC)   Relevant Orders   Comprehensive metabolic panel     Endocrine    Type II diabetes mellitus with renal manifestations (HCC) - Primary   Relevant Orders   POCT CBG (Fasting - Glucose) (Completed)   Lipid panel   Other Visit Diagnoses       Diabetic polyneuropathy associated with type 2 diabetes mellitus (HCC)       Relevant Medications   gabapentin (NEURONTIN) 100 MG capsule       Return in about 4 months (around 03/30/2024) for fu with labs prior.   Total time spent: 30 minutes  Luna Fuse, MD  11/29/2023   This document may have been prepared by [facility] Voice Recognition software and as such may include unintentional dictation errors.

## 2024-01-02 ENCOUNTER — Encounter: Payer: Self-pay | Admitting: Cardiovascular Disease

## 2024-01-02 ENCOUNTER — Ambulatory Visit (INDEPENDENT_AMBULATORY_CARE_PROVIDER_SITE_OTHER): Payer: Medicare Other | Admitting: Cardiovascular Disease

## 2024-01-02 VITALS — BP 105/72 | HR 65 | Ht 65.0 in | Wt 145.0 lb

## 2024-01-02 DIAGNOSIS — I480 Paroxysmal atrial fibrillation: Secondary | ICD-10-CM

## 2024-01-02 DIAGNOSIS — R0602 Shortness of breath: Secondary | ICD-10-CM | POA: Diagnosis not present

## 2024-01-02 DIAGNOSIS — I351 Nonrheumatic aortic (valve) insufficiency: Secondary | ICD-10-CM

## 2024-01-02 DIAGNOSIS — I255 Ischemic cardiomyopathy: Secondary | ICD-10-CM

## 2024-01-02 DIAGNOSIS — I340 Nonrheumatic mitral (valve) insufficiency: Secondary | ICD-10-CM | POA: Diagnosis not present

## 2024-01-02 DIAGNOSIS — I2583 Coronary atherosclerosis due to lipid rich plaque: Secondary | ICD-10-CM

## 2024-01-02 DIAGNOSIS — I5022 Chronic systolic (congestive) heart failure: Secondary | ICD-10-CM | POA: Diagnosis not present

## 2024-01-02 DIAGNOSIS — I2510 Atherosclerotic heart disease of native coronary artery without angina pectoris: Secondary | ICD-10-CM

## 2024-01-02 DIAGNOSIS — Z95810 Presence of automatic (implantable) cardiac defibrillator: Secondary | ICD-10-CM

## 2024-01-02 NOTE — Progress Notes (Signed)
 Cardiology Office Note   Date:  01/02/2024   ID:  Mackay, Hanauer 05-17-1946, MRN [id number]  PCP:  Shari Daughters, MD  Cardiologist:  Debborah Fairly, MD      History of Present Illness: Walter Blair is a 78 y.o. male who presents for  Chief Complaint  Patient presents with   Follow-up    2 month follow up     Doing well, no chest pain or SOB. Feels same.      Past Medical History:  Diagnosis Date   AICD (automatic cardioverter/defibrillator) present    Aortic atherosclerosis (HCC)    Atrial fibrillation (HCC)    a.) CHA2DS2-VASc = 7 (age x 2, CHF, CVA x 2, aortic plaque, T2DM). b.) rate/rhythm maintained on oral amiodarone; chronically anticoagulated on rivaroxaban. 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 b.) TTE 07/25/2021: EF 20-25%; global HK; G3DD; severely reduced RV function; Severe BAR; mod-severe MR, severe TR, mild AR.   HLD (hyperlipidemia)    HTN (hypertension)    Hypothyroidism    IDA (iron deficiency anemia)    Ischemic cardiomyopathy    a.) TTE 06/29/2017: EF 10-15%. b.) TTE 07/25/2021: ED 20-25%.   Left-sided cerebrovascular accident (CVA) (HCC) 2004   a.) residual weakness in LEFT hand   Long term current use of anticoagulant    a.) rivaroxaban   S/P CABG x 4 2003   [place] in [place]   T2DM (type 2 diabetes mellitus) (HCC)    Thrombocytopenia (HCC)    possibly associated with underlying DIC     Past Surgical History:  Procedure Laterality Date   BLADDER INSTILLATION N/A 08/07/2021   Procedure: BLADDER  INSTILLATION OF GEMCITABINE;  Surgeon: Lawerence Pressman, MD;  Location: [facility]S;  Service: Urology;  Laterality: N/A;   CARDIAC DEFIBRILLATOR PLACEMENT     CARDIOVERSION N/A 07/01/2017   Procedure: CARDIOVERSION;  Surgeon: Cherrie Cornwall, MD;  Location: [facility];  Service: Cardiovascular;  Laterality: N/A;   CARDIOVERSION N/A 07/05/2017   Procedure: CARDIOVERSION;  Surgeon: Cherrie Cornwall, MD;  Location: [facility];  Service: Cardiovascular;  Laterality: N/A;   COLONOSCOPY WITH PROPOFOL N/A 12/30/2020   Procedure: COLONOSCOPY WITH PROPOFOL;  Surgeon: Luke Salaam, MD;  Location: [facility] ENDOSCOPY;  Service: Gastroenterology;  Laterality: N/A;   CORONARY ARTERY BYPASS GRAFT N/A 2003   Procedure: Four-vessel CABG performed in [place]   CYSTOSCOPY W/ RETROGRADES Bilateral 08/07/2021   Procedure: CYSTOSCOPY WITH RETROGRADE PYELOGRAM;  Surgeon: Lawerence Pressman, MD;  Location: [facility];  Service: Urology;  Laterality: Bilateral;   ESOPHAGOGASTRODUODENOSCOPY  12/30/2020   Procedure: ESOPHAGOGASTRODUODENOSCOPY (EGD);  Surgeon: Luke Salaam, MD;  Location: [facility]OSCOPY;  Service: Gastroenterology;;   ESOPHAGOGASTRODUODENOSCOPY N/A 09/17/2021   Procedure: ESOPHAGOGASTRODUODENOSCOPY (EGD);  Surgeon: Irby Mannan, MD;  Location: [facility] ENDOSCOPY;  Service: Endoscopy;  Laterality: N/A;   GIVENS CAPSULE STUDY N/A 02/02/2021   Procedure: GIVENS CAPSULE STUDY;  Surgeon: Luke Salaam, MD;  Location: [facility] ENDOSCOPY;  Service: Gastroenterology;  Laterality: N/A;  WILL NEED  INTERPRETER ON WHEELS;  BROTHER WANTS TO TRANSLATE   TEE WITHOUT CARDIOVERSION N/A 07/01/2017   Procedure: TRANSESOPHAGEAL ECHOCARDIOGRAM (TEE);  Surgeon: Laurier Nancy, MD;  Location: [facility];  Service: Cardiovascular;  Laterality: N/A;   TEE WITHOUT CARDIOVERSION N/A 07/05/2017   Procedure: TRANSESOPHAGEAL ECHOCARDIOGRAM (TEE);  Surgeon: Laurier Nancy, MD;  Location: [facility];  Service: Cardiovascular;  Laterality: N/A;    TRANSURETHRAL RESECTION OF BLADDER TUMOR N/A 08/07/2021   Procedure: TRANSURETHRAL RESECTION OF BLADDER TUMOR (TURBT);  Surgeon: Sondra Come, MD;  Location: [facility];  Service: Urology;  Laterality: N/A;   URETEROSCOPY Left 08/07/2021   Procedure: DIAGNOSTIC URETEROSCOPY;  Surgeon: Sondra Come, MD;  Location: [facility];  Service: Urology;  Laterality: Left;     Current Outpatient Medications  Medication Sig Dispense Refill   amiodarone (PACERONE) 200 MG tablet TAKE 1/2 TABLET BY MOUTH EVERY DAY 45 tablet 2   atorvastatin (LIPITOR) 80 MG tablet Take 1 tablet (80 mg total) by mouth daily. 90 tablet 3   carvedilol (COREG) 6.25 MG tablet Take 1 tablet (6.25 mg total) by mouth 2 (two) times daily. 60 tablet 11   folic acid (FOLVITE) 1 MG tablet Take 1 tablet (1 mg total) by mouth every morning. 90 tablet 1   gabapentin (NEURONTIN) 100 MG capsule Take 1 capsule (100 mg total) by mouth at bedtime. 30 capsule 2   levothyroxine (SYNTHROID) 88 MCG tablet Take 88 mcg by mouth daily before breakfast.     losartan (COZAAR) 25 MG tablet TAKE 1 TABLET(25 MG) BY MOUTH DAILY 90 tablet 3   meclizine (ANTIVERT) 25 MG tablet TAKE 1 TABLET(25 MG) BY MOUTH THREE TIMES DAILY AS NEEDED 135 tablet 0   metFORMIN (GLUCOPHAGE-XR) 500 MG 24 hr tablet Take 500 mg by mouth daily.     pantoprazole (PROTONIX) 20 MG tablet TAKE 1 TABLET BY MOUTH EVERY DAY 90 tablet 1   torsemide (DEMADEX) 10 MG tablet Take 10 mg by mouth daily.     sulfamethoxazole-trimethoprim (BACTRIM DS) 800-160 MG tablet Take 1 tablet by mouth 2 (two) times daily. (Patient not taking: Reported on 09/09/2023) 20 tablet 0   No current facility-administered medications for this visit.    Allergies:   Lorazepam and Pork-derived products    Social History:   reports that he has quit smoking. His smoking use included cigarettes. He has quit using smokeless tobacco.  His smokeless tobacco use included chew. He reports that he does not drink alcohol  and does not use drugs.   Family History:  family history includes Diabetes in his brother and sister; Hypertension in his mother.    ROS:     Review of Systems  Constitutional: Negative.   HENT: Negative.    Eyes: Negative.   Respiratory: Negative.    Gastrointestinal: Negative.   Genitourinary: Negative.   Musculoskeletal: Negative.   Skin: Negative.   Neurological: Negative.   Endo/Heme/Allergies: Negative.   Psychiatric/Behavioral: Negative.    All other systems reviewed and are negative.     All other systems are reviewed and negative.    PHYSICAL EXAM: VS:  BP 105/72   Pulse 65   Ht 5\' 5"  (1.651 m)   Wt 145 lb (65.8 kg)   SpO2 98%   BMI 24.13 kg/m  , BMI Body mass index is 24.13 kg/m. Last weight:  Wt Readings from Last 3 Encounters:  01/02/24 145 lb (65.8 kg)  11/29/23 140 lb (63.5 kg)  11/04/23 145 lb (65.8 kg)  Physical Exam Vitals reviewed.  Constitutional:      Appearance: Normal appearance. He is normal weight.  HENT:     Head: Normocephalic.     Nose: Nose normal.     Mouth/Throat:     Mouth: Mucous membranes are moist.  Eyes:     Pupils: Pupils are equal, round, and reactive to light.  Cardiovascular:     Rate and Rhythm: Normal rate and regular rhythm.     Pulses: Normal pulses.     Heart sounds: Normal heart sounds.  Pulmonary:     Effort: Pulmonary effort is normal.  Abdominal:     General: Abdomen is flat. Bowel sounds are normal.  Musculoskeletal:        General: Normal range of motion.     Cervical back: Normal range of motion.  Skin:    General: Skin is warm.  Neurological:     General: No focal deficit present.     Mental Status: He is alert.  Psychiatric:        Mood and Affect: Mood normal.       EKG:   Recent Labs: No results found for requested labs within last 365 days.    Lipid Panel No results found for: "CHOL", "TRIG", "HDL", "CHOLHDL", "VLDL", "LDLCALC", "LDLDIRECT"    Other studies  Reviewed: Additional studies/ records that were reviewed today include:  Review of the above records demonstrates:       No data to display            ASSESSMENT AND PLAN:    ICD-10-CM   1. Chronic systolic congestive heart failure (HCC)  I50.22    compensated    2. Nonrheumatic aortic valve insufficiency  I35.1     3. Nonrheumatic mitral valve regurgitation  I34.0     4. SOB (shortness of breath)  R06.02     5. Coronary artery disease due to lipid rich plaque  I25.10    I25.83     6. Ischemic cardiomyopathy  I25.5     7. PAF (paroxysmal atrial fibrillation) (HCC)  I48.0     8. AICD (automatic cardioverter/defibrillator) present  Z95.810    will check battery life       Problem List Items Addressed This Visit       Cardiovascular and Mediastinum   PAF (paroxysmal atrial fibrillation) (HCC)   Relevant Medications   torsemide (DEMADEX) 10 MG tablet   CAD (coronary artery disease)   Relevant Medications   torsemide (DEMADEX) 10 MG tablet   Ischemic cardiomyopathy   Relevant Medications   torsemide (DEMADEX) 10 MG tablet   Chronic systolic congestive heart failure (HCC) - Primary   Relevant Medications   torsemide (DEMADEX) 10 MG tablet     Other   AICD (automatic cardioverter/defibrillator) present   Other Visit Diagnoses       Nonrheumatic aortic valve insufficiency       Relevant Medications   torsemide (DEMADEX) 10 MG tablet     Nonrheumatic mitral valve regurgitation       Relevant Medications   torsemide (DEMADEX) 10 MG tablet     SOB (shortness of breath)              Disposition:   Return in about 2 weeks (around 01/16/2024) for Get Defibrillator check and f/u.    Total time spent: 45 minutes  Signed,  Adrian Blackwater, MD  01/02/2024 9:44 AM    [facility]

## 2024-01-16 ENCOUNTER — Other Ambulatory Visit: Payer: Self-pay

## 2024-01-16 ENCOUNTER — Encounter: Payer: Self-pay | Admitting: Cardiovascular Disease

## 2024-01-16 ENCOUNTER — Ambulatory Visit (INDEPENDENT_AMBULATORY_CARE_PROVIDER_SITE_OTHER): Admitting: Cardiovascular Disease

## 2024-01-16 VITALS — BP 82/65 | HR 72 | Ht 65.0 in | Wt 143.0 lb

## 2024-01-16 DIAGNOSIS — I480 Paroxysmal atrial fibrillation: Secondary | ICD-10-CM

## 2024-01-16 DIAGNOSIS — I255 Ischemic cardiomyopathy: Secondary | ICD-10-CM

## 2024-01-16 DIAGNOSIS — I5022 Chronic systolic (congestive) heart failure: Secondary | ICD-10-CM | POA: Diagnosis not present

## 2024-01-16 DIAGNOSIS — Z95810 Presence of automatic (implantable) cardiac defibrillator: Secondary | ICD-10-CM | POA: Diagnosis not present

## 2024-01-16 DIAGNOSIS — E1142 Type 2 diabetes mellitus with diabetic polyneuropathy: Secondary | ICD-10-CM

## 2024-01-16 MED ORDER — GABAPENTIN 100 MG PO CAPS: 100.0000 mg | ORAL_CAPSULE | Freq: Every evening | ORAL | 2 refills | Status: DC

## 2024-01-16 NOTE — Progress Notes (Signed)
 Cardiology Office Note   Date:  01/16/2024   ID:  Ryzen, To Jan 11, 1946, MRN [id number]  PCP:  Shari Daughters, MD  Cardiologist:  Debborah Fairly, MD      History of Present Illness: Walter Blair is a 78 y.o. male who presents for  Chief Complaint  Patient presents with   Follow-up    2 week pacemaker check    Came for AICD check up, battery life 9 month.      Past Medical History:  Diagnosis Date   AICD (automatic cardioverter/defibrillator) present    Aortic atherosclerosis (HCC)    Atrial fibrillation (HCC)    a.) CHA2DS2-VASc = 7 (age x 2, CHF, CVA x 2, aortic plaque, T2DM). b.) rate/rhythm maintained on oral amiodarone ; chronically anticoagulated on rivaroxaban . 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 b.) TTE 07/25/2021: EF 20-25%; global HK; G3DD; severely reduced RV function; Severe BAR; mod-severe MR, severe TR, mild AR.   HLD (hyperlipidemia)    HTN (hypertension)    Hypothyroidism    IDA (iron deficiency anemia)    Ischemic cardiomyopathy    a.) TTE 06/29/2017: EF 10-15%. b.) TTE 07/25/2021: ED 20-25%.   Left-sided cerebrovascular accident (CVA) (HCC) 2004   a.) residual weakness in LEFT hand   Long term current use of anticoagulant    a.) rivaroxaban    S/P CABG x 4 2003   [place] in [place]   T2DM (type 2 diabetes mellitus) (HCC)    Thrombocytopenia (HCC)    possibly associated with underlying DIC     Past Surgical History:  Procedure Laterality Date   BLADDER INSTILLATION N/A 08/07/2021   Procedure:  BLADDER INSTILLATION OF GEMCITABINE ;  Surgeon: Lawerence Pressman, MD;  Location: [facility];  Service: Urology;  Laterality: N/A;   CARDIAC DEFIBRILLATOR PLACEMENT     CARDIOVERSION N/A 07/01/2017   Procedure: CARDIOVERSION;  Surgeon: Cherrie Cornwall, MD;  Location: [facility];  Service: Cardiovascular;  Laterality: N/A;   CARDIOVERSION N/A 07/05/2017   Procedure: CARDIOVERSION;  Surgeon: Cherrie Cornwall, MD;  Location: [facility];  Service: Cardiovascular;  Laterality: N/A;   COLONOSCOPY WITH PROPOFOL  N/A 12/30/2020   Procedure: COLONOSCOPY WITH PROPOFOL ;  Surgeon: Luke Salaam, MD;  Location: [facility] ENDOSCOPY;  Service: Gastroenterology;  Laterality: N/A;   CORONARY ARTERY BYPASS GRAFT N/A 2003   Procedure: Four-vessel CABG performed in [place]   CYSTOSCOPY W/ RETROGRADES Bilateral 08/07/2021   Procedure: CYSTOSCOPY WITH RETROGRADE PYELOGRAM;  Surgeon: Lawerence Pressman, MD;  Location: [facility];  Service: Urology;  Laterality: Bilateral;   ESOPHAGOGASTRODUODENOSCOPY  12/30/2020   Procedure: ESOPHAGOGASTRODUODENOSCOPY (EGD);  Surgeon: Luke Salaam, MD;  Location: [facility] ENDOSCOPY;  Service: Gastroenterology;;   ESOPHAGOGASTRODUODENOSCOPY N/A 09/17/2021   Procedure: ESOPHAGOGASTRODUODENOSCOPY (EGD);  Surgeon: Irby Mannan, MD;  Location: [facility] ENDOSCOPY;  Service: Endoscopy;  Laterality: N/A;   GIVENS CAPSULE STUDY N/A 02/02/2021   Procedure: GIVENS CAPSULE STUDY;  Surgeon: Luke Salaam, MD;  Location: [facility] ENDOSCOPY;  Service: Gastroenterology;  Laterality: N/A;  WILL NEED INTERPRETER  ON WHEELS;  BROTHER WANTS TO TRANSLATE   TEE WITHOUT CARDIOVERSION N/A 07/01/2017   Procedure: TRANSESOPHAGEAL ECHOCARDIOGRAM (TEE);  Surgeon: Cherrie Cornwall, MD;  Location: [facility];  Service: Cardiovascular;  Laterality: N/A;   TEE WITHOUT CARDIOVERSION N/A 07/05/2017   Procedure: TRANSESOPHAGEAL ECHOCARDIOGRAM (TEE);  Surgeon: Cherrie Cornwall, MD;  Location: [facility];  Service: Cardiovascular;  Laterality: N/A;    TRANSURETHRAL RESECTION OF BLADDER TUMOR N/A 08/07/2021   Procedure: TRANSURETHRAL RESECTION OF BLADDER TUMOR (TURBT);  Surgeon: Lawerence Pressman, MD;  Location: [facility];  Service: Urology;  Laterality: N/A;   URETEROSCOPY Left 08/07/2021   Procedure: DIAGNOSTIC URETEROSCOPY;  Surgeon: Lawerence Pressman, MD;  Location: [facility];  Service: Urology;  Laterality: Left;     Current Outpatient Medications  Medication Sig Dispense Refill   amiodarone  (PACERONE ) 200 MG tablet TAKE 1/2 TABLET BY MOUTH EVERY DAY 45 tablet 2   atorvastatin  (LIPITOR ) 80 MG tablet Take 1 tablet (80 mg total) by mouth daily. 90 tablet 3   carvedilol  (COREG ) 6.25 MG tablet Take 1 tablet (6.25 mg total) by mouth 2 (two) times daily. 60 tablet 11   folic acid  (FOLVITE ) 1 MG tablet Take 1 tablet (1 mg total) by mouth every morning. 90 tablet 1   gabapentin  (NEURONTIN ) 100 MG capsule Take 1 capsule (100 mg total) by mouth at bedtime. 30 capsule 2   levothyroxine  (SYNTHROID ) 88 MCG tablet Take 88 mcg by mouth daily before breakfast.     losartan  (COZAAR ) 25 MG tablet TAKE 1 TABLET(25 MG) BY MOUTH DAILY 90 tablet 3   meclizine  (ANTIVERT ) 25 MG tablet TAKE 1 TABLET(25 MG) BY MOUTH THREE TIMES DAILY AS NEEDED 135 tablet 0   metFORMIN (GLUCOPHAGE-XR) 500 MG 24 hr tablet Take 500 mg by mouth daily.     pantoprazole  (PROTONIX ) 20 MG tablet TAKE 1 TABLET BY MOUTH EVERY DAY 90 tablet 1   sulfamethoxazole -trimethoprim  (BACTRIM  DS) 800-160 MG tablet Take 1 tablet by mouth 2 (two) times daily. 20 tablet 0   torsemide  (DEMADEX ) 10 MG tablet Take 10 mg by mouth daily.     No current facility-administered medications for this visit.    Allergies:   Lorazepam  and Pork-derived products    Social History:   reports that he has quit smoking. His smoking use included cigarettes. He has quit using smokeless tobacco.  His smokeless tobacco use included chew. He reports that he does not drink alcohol and does not use drugs.   Family  History:  family history includes Diabetes in his brother and sister; Hypertension in his mother.    ROS:     Review of Systems  Constitutional: Negative.   HENT: Negative.    Eyes: Negative.   Respiratory: Negative.    Gastrointestinal: Negative.   Genitourinary: Negative.   Musculoskeletal: Negative.   Skin: Negative.   Neurological: Negative.   Endo/Heme/Allergies: Negative.   Psychiatric/Behavioral: Negative.    All other systems reviewed and are negative.     All other systems are reviewed and negative.    PHYSICAL EXAM: VS:  BP (!) 82/65   Pulse 72   Ht 5\' 5"  (1.651 m)   Wt 143 lb (64.9 kg)   SpO2 96%   BMI 23.80 kg/m  , BMI Body mass index is 23.8 kg/m. Last weight:  Wt Readings from Last 3 Encounters:  01/16/24 143 lb (64.9 kg)  01/02/24 145 lb (65.8 kg)  11/29/23 140 lb (63.5 kg)     Physical Exam Vitals  reviewed.  Constitutional:      Appearance: Normal appearance. He is normal weight.  HENT:     Head: Normocephalic.     Nose: Nose normal.     Mouth/Throat:     Mouth: Mucous membranes are moist.  Eyes:     Pupils: Pupils are equal, round, and reactive to light.  Cardiovascular:     Rate and Rhythm: Normal rate and regular rhythm.     Pulses: Normal pulses.     Heart sounds: Normal heart sounds.  Pulmonary:     Effort: Pulmonary effort is normal.  Abdominal:     General: Abdomen is flat. Bowel sounds are normal.  Musculoskeletal:        General: Normal range of motion.     Cervical back: Normal range of motion.  Skin:    General: Skin is warm.  Neurological:     General: No focal deficit present.     Mental Status: He is alert.  Psychiatric:        Mood and Affect: Mood normal.       EKG:   Recent Labs: No results found for requested labs within last 365 days.    Lipid Panel No results found for: "CHOL", "TRIG", "HDL", "CHOLHDL", "VLDL", "LDLCALC", "LDLDIRECT"    Other studies Reviewed: Additional studies/ records that were  reviewed today include:  Review of the above records demonstrates:       No data to display            ASSESSMENT AND PLAN:    ICD-10-CM   1. PAF (paroxysmal atrial fibrillation) (HCC)  I48.0     2. AICD (automatic cardioverter/defibrillator) present  Z95.810    Battery life still 9 month, replace less tahn 6 month.    3. Chronic systolic congestive heart failure (HCC)  I50.22     4. Ischemic cardiomyopathy  I25.5        Problem List Items Addressed This Visit       Cardiovascular and Mediastinum   PAF (paroxysmal atrial fibrillation) (HCC) - Primary   Ischemic cardiomyopathy   Chronic systolic congestive heart failure (HCC)     Other   AICD (automatic cardioverter/defibrillator) present       Disposition:   No follow-ups on file.    Total time spent: 35 minutes  Signed,  Debborah Fairly, MD  01/16/2024 10:36 AM    [facility]

## 2024-02-21 ENCOUNTER — Other Ambulatory Visit
Admission: RE | Admit: 2024-02-21 | Discharge: 2024-02-21 | Disposition: A | Discharge status: Home / Self Care | Attending: Ophthalmology | Admitting: Ophthalmology

## 2024-02-21 DIAGNOSIS — R519 Headache, unspecified: Secondary | ICD-10-CM | POA: Diagnosis present

## 2024-02-21 DIAGNOSIS — H5711 Ocular pain, right eye: Secondary | ICD-10-CM | POA: Diagnosis not present

## 2024-02-21 LAB — CBC WITH DIFFERENTIAL/PLATELET
Abs Immature Granulocytes: 0.02 10*3/uL (ref 0.00–0.07)
Basophils Absolute: 0 10*3/uL (ref 0.0–0.1)
Basophils Relative: 1 %
Eosinophils Absolute: 0.1 10*3/uL (ref 0.0–0.5)
Eosinophils Relative: 2 %
HCT: 27 % — ABNORMAL LOW (ref 39.0–52.0)
Hemoglobin: 9.1 g/dL — ABNORMAL LOW (ref 13.0–17.0)
Immature Granulocytes: 0 %
Lymphocytes Relative: 17 %
Lymphs Abs: 0.9 10*3/uL (ref 0.7–4.0)
MCH: 28.5 pg (ref 26.0–34.0)
MCHC: 33.7 g/dL (ref 30.0–36.0)
MCV: 84.6 fL (ref 80.0–100.0)
Monocytes Absolute: 0.8 10*3/uL (ref 0.1–1.0)
Monocytes Relative: 15 %
Neutro Abs: 3.4 10*3/uL (ref 1.7–7.7)
Neutrophils Relative %: 65 %
Platelets: 169 10*3/uL (ref 150–400)
RBC: 3.19 MIL/uL — ABNORMAL LOW (ref 4.22–5.81)
RDW: 15.2 % (ref 11.5–15.5)
WBC: 5.3 10*3/uL (ref 4.0–10.5)
nRBC: 0 % (ref 0.0–0.2)

## 2024-02-21 LAB — SEDIMENTATION RATE: Sed Rate: 26 mm/h — ABNORMAL HIGH (ref 0–20)

## 2024-02-21 LAB — C-REACTIVE PROTEIN: CRP: 0.6 mg/dL (ref <1.0)

## 2024-03-06 ENCOUNTER — Encounter: Payer: Self-pay | Admitting: Internal Medicine

## 2024-03-19 ENCOUNTER — Ambulatory Visit: Admitting: Cardiovascular Disease

## 2024-03-22 ENCOUNTER — Other Ambulatory Visit: Payer: Self-pay | Admitting: Cardiovascular Disease

## 2024-03-27 ENCOUNTER — Encounter: Payer: Self-pay | Admitting: Cardiovascular Disease

## 2024-03-27 ENCOUNTER — Ambulatory Visit (INDEPENDENT_AMBULATORY_CARE_PROVIDER_SITE_OTHER): Admitting: Cardiovascular Disease

## 2024-03-27 VITALS — BP 107/68 | Ht 65.0 in | Wt 143.0 lb

## 2024-03-27 DIAGNOSIS — R0602 Shortness of breath: Secondary | ICD-10-CM

## 2024-03-27 DIAGNOSIS — Z95810 Presence of automatic (implantable) cardiac defibrillator: Secondary | ICD-10-CM

## 2024-03-27 DIAGNOSIS — I255 Ischemic cardiomyopathy: Secondary | ICD-10-CM

## 2024-03-27 DIAGNOSIS — I5022 Chronic systolic (congestive) heart failure: Secondary | ICD-10-CM

## 2024-03-27 DIAGNOSIS — I351 Nonrheumatic aortic (valve) insufficiency: Secondary | ICD-10-CM

## 2024-03-27 DIAGNOSIS — Z0130 Encounter for examination of blood pressure without abnormal findings: Secondary | ICD-10-CM

## 2024-03-27 DIAGNOSIS — I340 Nonrheumatic mitral (valve) insufficiency: Secondary | ICD-10-CM

## 2024-03-27 DIAGNOSIS — I480 Paroxysmal atrial fibrillation: Secondary | ICD-10-CM | POA: Diagnosis not present

## 2024-03-27 DIAGNOSIS — M79662 Pain in left lower leg: Secondary | ICD-10-CM

## 2024-03-27 DIAGNOSIS — M79661 Pain in right lower leg: Secondary | ICD-10-CM

## 2024-03-27 MED ORDER — TORSEMIDE 10 MG PO TABS: 10.0000 mg | ORAL_TABLET | Freq: Two times a day (BID) | ORAL | 2 refills | Status: DC

## 2024-03-27 NOTE — Progress Notes (Signed)
 Cardiology Office Note   Date:  03/27/2024   ID:  [name], [name] [birth year], MRN [id number]  PCP:  Albina GORMAN Dine, MD  Cardiologist:  Denyse Bathe, MD      History of Present Illness: Walter Blair is a 78 y.o. male who presents for  Chief Complaint  Patient presents with   Follow-up    2 month follow up. Pt is experiencing pain and swelling in legs and has no energy.     Has pain lower legs, and swelling and feels dizzy.      Past Medical History:  Diagnosis Date   AICD (automatic cardioverter/defibrillator) present    Aortic atherosclerosis (HCC)    Atrial fibrillation (HCC)    a.) CHA2DS2-VASc = 7 (age x 2, CHF, CVA x 2, aortic plaque, T2DM). b.) rate/rhythm maintained on oral amiodarone ; chronically anticoagulated on rivaroxaban . 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 b.) TTE 07/25/2021: EF 20-25%; global HK; G3DD; severely reduced RV function; Severe BAR; mod-severe MR, severe TR, mild AR.   HLD (hyperlipidemia)    HTN (hypertension)    Hypothyroidism    IDA (iron deficiency anemia)    Ischemic cardiomyopathy    a.) TTE 06/29/2017: EF 10-15%. b.) TTE 07/25/2021: ED 20-25%.   Left-sided cerebrovascular accident (CVA) (HCC) 2004   a.) residual weakness in LEFT hand   Long term current use of anticoagulant    a.) rivaroxaban    S/P CABG x 4 2003   [place]   T2DM (type 2 diabetes mellitus) (HCC)    Thrombocytopenia (HCC)    possibly associated with underlying DIC     Past Surgical History:  Procedure Laterality  Date   BLADDER INSTILLATION N/A 08/07/2021   Procedure: BLADDER INSTILLATION OF GEMCITABINE ;  Surgeon: [name], MD;  Location: [facility] ORS;  Service: Urology;  Laterality: N/A;   CARDIAC DEFIBRILLATOR PLACEMENT     CARDIOVERSION N/A 07/01/2017   Procedure: CARDIOVERSION;  Surgeon: Bathe Denyse LABOR, MD;  Location: [facility] ORS;  Service: Cardiovascular;  Laterality: N/A;   CARDIOVERSION N/A 07/05/2017   Procedure: CARDIOVERSION;  Surgeon: Bathe Denyse LABOR, MD;  Location: [facility];  Service: Cardiovascular;  Laterality: N/A;   COLONOSCOPY WITH PROPOFOL  N/A 12/30/2020   Procedure: COLONOSCOPY WITH PROPOFOL ;  Surgeon: [name], MD;  Location: [facility] ENDOSCOPY;  Service: Gastroenterology;  Laterality: N/A;   CORONARY ARTERY BYPASS GRAFT N/A 2003   Procedure: Four-vessel CABG [place]   CYSTOSCOPY W/ RETROGRADES Bilateral 08/07/2021   Procedure: CYSTOSCOPY WITH RETROGRADE PYELOGRAM;  Surgeon: [name], MD;  Location: [facility];  Service: Urology;  Laterality: Bilateral;   ESOPHAGOGASTRODUODENOSCOPY  12/30/2020   Procedure: ESOPHAGOGASTRODUODENOSCOPY (EGD);  Surgeon: [name], MD;  Location: [facility] ENDOSCOPY;  Service: Gastroenterology;;   ESOPHAGOGASTRODUODENOSCOPY N/A 09/17/2021   Procedure: ESOPHAGOGASTRODUODENOSCOPY (EGD);  Surgeon: [name], MD;  Location[facility] ENDOSCOPY;  Service: Endoscopy;  Laterality: N/A;   GIVENS CAPSULE STUDY N/A 02/02/2021   Procedure: GIVENS CAPSULE STUDY;  Surgeon: [name], MD;  Location: [facility] ENDOSCOPY;  Service: Gastroenterology;  Laterality: N/A;  WILL NEED INTERPRETER ON WHEELS;  BROTHER WANTS TO TRANSLATE   TEE WITHOUT CARDIOVERSION N/A 07/01/2017   Procedure: TRANSESOPHAGEAL ECHOCARDIOGRAM (TEE);  Surgeon: [name], MD;  Location: [facility] ORS;  Service: Cardiovascular;  Laterality: N/A;   TEE WITHOUT CARDIOVERSION N/A 07/05/2017   Procedure: TRANSESOPHAGEAL ECHOCARDIOGRAM (TEE);  Surgeon: [name], MD;   Location: [facility] ORS;  Service: Cardiovascular;  Laterality: N/A;   TRANSURETHRAL RESECTION OF BLADDER TUMOR N/A 08/07/2021   Procedure: TRANSURETHRAL RESECTION OF BLADDER TUMOR (TURBT);  Surgeon: [name], MD;  Location: [facility] OR[facility];  Service: Urology;  Laterality: N/A;   URETEROSCOPY Left 08/07/2021   Procedure: DIAGNOSTIC URETEROSCOPY;  Surgeon: [name], MD;  Location: [facility] ORS;  Service: Urology;  Laterality: Left;     Current Outpatient Medications  Medication Sig Dispense Refill   amiodarone  (PACERONE ) 200 MG tablet TAKE 1/2 TABLET BY MOUTH EVERY DAY 45 tablet 2   atorvastatin  (LIPITOR ) 80 MG tablet TAKE 1 TABLET(80 MG) BY MOUTH DAILY 90 tablet 3   carvedilol  (COREG ) 6.25 MG tablet Take 1 tablet (6.25 mg total) by mouth 2 (two) times daily. 60 tablet 11   folic acid  (FOLVITE ) 1 MG tablet Take 1 tablet (1 mg total) by mouth every morning. 90 tablet 1   gabapentin  (NEURONTIN ) 100 MG capsule Take 1 capsule (100 mg total) by mouth at bedtime. 30 capsule 2   levothyroxine  (SYNTHROID ) 88 MCG tablet Take 88 mcg by mouth daily before breakfast.     losartan  (COZAAR ) 25 MG tablet TAKE 1 TABLET(25 MG) BY MOUTH DAILY 90 tablet 3   meclizine  (ANTIVERT ) 25 MG tablet TAKE 1 TABLET(25 MG) BY MOUTH THREE TIMES DAILY AS NEEDED 135 tablet 0   metFORMIN (GLUCOPHAGE-XR) 500 MG 24 hr tablet Take 500 mg by mouth daily.     pantoprazole  (PROTONIX ) 20 MG tablet TAKE 1 TABLET BY MOUTH EVERY DAY 90 tablet 1   sulfamethoxazole -trimethoprim  (BACTRIM  DS) 800-160 MG tablet Take 1 tablet by mouth 2 (two) times daily. 20 tablet 0   torsemide  (DEMADEX ) 10 MG tablet Take 1 tablet (10 mg total) by mouth 2 (two) times daily. 60 tablet 2   No current facility-administered medications for this visit.    Allergies:   Lorazepam  and Pork-derived products    Social History:   reports that he has quit smoking. His smoking use included cigarettes. He has quit using smokeless tobacco.  His smokeless tobacco use  included chew. He reports that he does not drink alcohol and does not use drugs.   Family History:  family history includes Diabetes in his brother and sister; Hypertension in his mother.    ROS:     Review of Systems  Constitutional: Negative.   HENT: Negative.    Eyes: Negative.   Respiratory: Negative.    Gastrointestinal: Negative.   Genitourinary: Negative.   Musculoskeletal: Negative.   Skin: Negative.   Neurological: Negative.   Endo/Heme/Allergies: Negative.   Psychiatric/Behavioral: Negative.    All other systems reviewed and are negative.     All other systems are reviewed and negative.    PHYSICAL EXAM: VS:  BP 107/68   Ht 5' 5 (1.651 m)   Wt 143 lb (64.9 kg)   BMI 23.80 kg/m  , BMI Body mass index is 23.8 kg/m. Last weight:  Wt Readings from Last 3 Encounters:  03/27/24 143 lb (64.9 kg)  01/16/24 143 lb (64.9 kg)  01/02/24 145 lb (  65.8 kg)     Physical Exam Vitals reviewed.  Constitutional:      Appearance: Normal appearance. He is normal weight.  HENT:     Head: Normocephalic.     Nose: Nose normal.     Mouth/Throat:     Mouth: Mucous membranes are moist.  Eyes:     Pupils: Pupils are equal, round, and reactive to light.  Cardiovascular:     Rate and Rhythm: Normal rate and regular rhythm.     Pulses: Normal pulses.     Heart sounds: Normal heart sounds.  Pulmonary:     Effort: Pulmonary effort is normal.  Abdominal:     General: Abdomen is flat. Bowel sounds are normal.  Musculoskeletal:        General: Normal range of motion.     Cervical back: Normal range of motion.  Skin:    General: Skin is warm.  Neurological:     General: No focal deficit present.     Mental Status: He is alert.  Psychiatric:        Mood and Affect: Mood normal.       EKG:   Recent Labs: 02/21/2024: Hemoglobin 9.1; Platelets 169    Lipid Panel No results found for: CHOL, TRIG, HDL, CHOLHDL, VLDL, LDLCALC, LDLDIRECT    Other studies  Reviewed: Additional studies/ records that were reviewed today include:  Review of the above records demonstrates:       No data to display            ASSESSMENT AND PLAN:    ICD-10-CM   1. PAF (paroxysmal atrial fibrillation) (HCC)  I48.0 PCV ECHOCARDIOGRAM COMPLETE    MYOCARDIAL PERFUSION IMAGING    US  Venous Img Lower Bilateral (DVT)    torsemide  (DEMADEX ) 10 MG tablet    2. AICD (automatic cardioverter/defibrillator) present  Z95.810 PCV ECHOCARDIOGRAM COMPLETE    MYOCARDIAL PERFUSION IMAGING    US  Venous Img Lower Bilateral (DVT)    torsemide  (DEMADEX ) 10 MG tablet    3. Chronic systolic congestive heart failure (HCC)  I50.22 PCV ECHOCARDIOGRAM COMPLETE    MYOCARDIAL PERFUSION IMAGING    US  Venous Img Lower Bilateral (DVT)    torsemide  (DEMADEX ) 10 MG tablet   Torsemide  bid 10 mg    4. Ischemic cardiomyopathy  I25.5 PCV ECHOCARDIOGRAM COMPLETE    MYOCARDIAL PERFUSION IMAGING    US  Venous Img Lower Bilateral (DVT)    torsemide  (DEMADEX ) 10 MG tablet    5. Nonrheumatic aortic valve insufficiency  I35.1 PCV ECHOCARDIOGRAM COMPLETE    MYOCARDIAL PERFUSION IMAGING    US  Venous Img Lower Bilateral (DVT)    torsemide  (DEMADEX ) 10 MG tablet    6. Nonrheumatic mitral valve regurgitation  I34.0 PCV ECHOCARDIOGRAM COMPLETE    MYOCARDIAL PERFUSION IMAGING    US  Venous Img Lower Bilateral (DVT)    torsemide  (DEMADEX ) 10 MG tablet    7. SOB (shortness of breath)  R06.02 PCV ECHOCARDIOGRAM COMPLETE    MYOCARDIAL PERFUSION IMAGING    US  Venous Img Lower Bilateral (DVT)    torsemide  (DEMADEX ) 10 MG tablet   SOB getting worse,d echo, stress test.    8. Pain in both lower legs  M79.661 PCV ECHOCARDIOGRAM COMPLETE   M79.662 MYOCARDIAL PERFUSION IMAGING    US  Venous Img Lower Bilateral (DVT)    torsemide  (DEMADEX ) 10 MG tablet       Problem List Items Addressed This Visit       Cardiovascular and Mediastinum   PAF (paroxysmal  atrial fibrillation) (HCC) - Primary    Relevant Medications   torsemide  (DEMADEX ) 10 MG tablet   Other Relevant Orders   PCV ECHOCARDIOGRAM COMPLETE   MYOCARDIAL PERFUSION IMAGING   US  Venous Img Lower Bilateral (DVT)   Ischemic cardiomyopathy   Relevant Medications   torsemide  (DEMADEX ) 10 MG tablet   Other Relevant Orders   PCV ECHOCARDIOGRAM COMPLETE   MYOCARDIAL PERFUSION IMAGING   US  Venous Img Lower Bilateral (DVT)   Chronic systolic congestive heart failure (HCC)   Relevant Medications   torsemide  (DEMADEX ) 10 MG tablet   Other Relevant Orders   PCV ECHOCARDIOGRAM COMPLETE   MYOCARDIAL PERFUSION IMAGING   US  Venous Img Lower Bilateral (DVT)     Other   AICD (automatic cardioverter/defibrillator) present   Relevant Medications   torsemide  (DEMADEX ) 10 MG tablet   Other Relevant Orders   PCV ECHOCARDIOGRAM COMPLETE   MYOCARDIAL PERFUSION IMAGING   US  Venous Img Lower Bilateral (DVT)   Other Visit Diagnoses       Nonrheumatic aortic valve insufficiency       Relevant Medications   torsemide  (DEMADEX ) 10 MG tablet   Other Relevant Orders   PCV ECHOCARDIOGRAM COMPLETE   MYOCARDIAL PERFUSION IMAGING   US  Venous Img Lower Bilateral (DVT)     Nonrheumatic mitral valve regurgitation       Relevant Medications   torsemide  (DEMADEX ) 10 MG tablet   Other Relevant Orders   PCV ECHOCARDIOGRAM COMPLETE   MYOCARDIAL PERFUSION IMAGING   US  Venous Img Lower Bilateral (DVT)     SOB (shortness of breath)       SOB getting worse,d echo, stress test.   Relevant Medications   torsemide  (DEMADEX ) 10 MG tablet   Other Relevant Orders   PCV ECHOCARDIOGRAM COMPLETE   MYOCARDIAL PERFUSION IMAGING   US  Venous Img Lower Bilateral (DVT)     Pain in both lower legs       Relevant Medications   torsemide  (DEMADEX ) 10 MG tablet   Other Relevant Orders   PCV ECHOCARDIOGRAM COMPLETE   MYOCARDIAL PERFUSION IMAGING   US  Venous Img Lower Bilateral (DVT)          Disposition:   Return in about 2 weeks (around 04/10/2024)  for echo, stress test, u/s of legs r/o DVT and f/u.    Total time spent: 30 minutes  Signed,  Denyse Bathe, MD  03/27/2024 10:13 AM    [facility]

## 2024-03-28 ENCOUNTER — Ambulatory Visit (INDEPENDENT_AMBULATORY_CARE_PROVIDER_SITE_OTHER)

## 2024-03-28 DIAGNOSIS — M79662 Pain in left lower leg: Secondary | ICD-10-CM | POA: Diagnosis not present

## 2024-03-28 DIAGNOSIS — R0602 Shortness of breath: Secondary | ICD-10-CM | POA: Diagnosis not present

## 2024-03-28 DIAGNOSIS — I340 Nonrheumatic mitral (valve) insufficiency: Secondary | ICD-10-CM

## 2024-03-28 DIAGNOSIS — M79661 Pain in right lower leg: Secondary | ICD-10-CM | POA: Diagnosis not present

## 2024-03-28 DIAGNOSIS — I480 Paroxysmal atrial fibrillation: Secondary | ICD-10-CM

## 2024-03-28 DIAGNOSIS — Z95810 Presence of automatic (implantable) cardiac defibrillator: Secondary | ICD-10-CM

## 2024-03-28 DIAGNOSIS — I255 Ischemic cardiomyopathy: Secondary | ICD-10-CM

## 2024-03-28 DIAGNOSIS — I5022 Chronic systolic (congestive) heart failure: Secondary | ICD-10-CM

## 2024-03-28 DIAGNOSIS — I351 Nonrheumatic aortic (valve) insufficiency: Secondary | ICD-10-CM

## 2024-03-30 ENCOUNTER — Ambulatory Visit (INDEPENDENT_AMBULATORY_CARE_PROVIDER_SITE_OTHER): Admitting: Internal Medicine

## 2024-03-30 ENCOUNTER — Ambulatory Visit: Admitting: Internal Medicine

## 2024-03-30 ENCOUNTER — Ambulatory Visit: Payer: Self-pay | Admitting: Cardiology

## 2024-03-30 VITALS — BP 106/52 | HR 76 | Temp 97.8°F | Ht 65.0 in

## 2024-03-30 DIAGNOSIS — R5381 Other malaise: Secondary | ICD-10-CM

## 2024-03-30 DIAGNOSIS — Z0130 Encounter for examination of blood pressure without abnormal findings: Secondary | ICD-10-CM

## 2024-03-30 DIAGNOSIS — E139 Other specified diabetes mellitus without complications: Secondary | ICD-10-CM | POA: Diagnosis not present

## 2024-03-30 LAB — POCT CBG (FASTING - GLUCOSE)-MANUAL ENTRY: Glucose Fasting, POC: 78 mg/dL (ref 70–99)

## 2024-03-30 MED ORDER — GABAPENTIN 100 MG PO CAPS: 100.0000 mg | ORAL_CAPSULE | Freq: Three times a day (TID) | ORAL | 0 refills | Status: DC

## 2024-03-30 NOTE — Progress Notes (Signed)
 Established Patient Office Visit  Subjective:  Patient ID: Walter Blair, male    DOB: [birth year]  Age: 78 y.o. MRN: [id number]  Chief Complaint  Patient presents with   Follow-up    4 month follow up    No new complaints, here for lab review and medication refills. Worsening physical debility due to worsening pain in his lower legs and weakness in his legs. Increasing edema for which diuretics were increased by Cardiology and currently undergoing cardiac work up.    No other concerns at this time.   Past Medical History:  Diagnosis Date   AICD (automatic cardioverter/defibrillator) present    Aortic atherosclerosis (HCC)    Atrial fibrillation (HCC)    a.) CHA2DS2-VASc = 7 (age x 2, CHF, CVA x 2, aortic plaque, T2DM). b.) rate/rhythm maintained on oral amiodarone ; chronically anticoagulated on rivaroxaban . 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 b.) TTE 07/25/2021: EF 20-25%; global HK; G3DD; severely reduced RV function; Severe BAR; mod-severe MR, severe TR, mild AR.   HLD (hyperlipidemia)    HTN (hypertension)    Hypothyroidism    IDA (iron deficiency anemia)    Ischemic cardiomyopathy    a.) TTE 06/29/2017: EF 10-15%. b.) TTE 07/25/2021: ED 20-25%.   Left-sided cerebrovascular accident (CVA) (HCC) 2004   a.) residual weakness in LEFT hand   Long term current use of anticoagulant    a.) rivaroxaban    Ahmari Duerson/P CABG x 4 2003   [place]formed in [place]   T2DM (type 2 diabetes mellitus) (HCC)    Thrombocytopenia (HCC)    possibly associated with underlying  DIC    Past Surgical History:  Procedure Laterality Date   BLADDER INSTILLATION N/A 08/07/2021   Procedure: BLADDER INSTILLATION OF GEMCITABINE ;  Surgeon: [name], MD;  Location: [facility] ORS;  Service: Urology;  Laterality: N/A;   CARDIAC DEFIBRILLATOR PLACEMENT     CARDIOVERSION N/A 07/01/2017   Procedure: CARDIOVERSION;  Surgeon: [name], MD;  Location: [facility] ORS;  Service: Cardiovascular;  Laterality: N/A;   CARDIOVERSION N/A 07/05/2017   Procedure: CARDIOVERSION;  Surgeon: [name], MD;  Location: [facility] ORS;  Service: Cardiovascular;  Laterality: N/A;   COLONOSCOPY WITH PROPOFOL  N/A 12/30/2020   Procedure: COLONOSCOPY WITH PROPOFOL ;  Surgeon: [name], MD;  Location: [facility] ENDOSCOPY;  Service: Gastroenterology;  Laterality: N/A;   CORONARY ARTERY BYPASS GRAFT N/A 2003   Procedure: Four-vessel CABG [place]   CYSTOSCOPY W/ RETROGRADES Bilateral 08/07/2021   Procedure: CYSTOSCOPY WITH RETROGRADE PYELOGRAM;  Surgeon: [name], MD;  Location: [facility] ORS;  Service: Urology;  Laterality: Bilateral;   ESOPHAGOGASTRODUODENOSCOPY  12/30/2020   Procedure: ESOPHAGOGASTRODUODENOSCOPY (EGD);  Surgeon: [name], MD;  Location: [facility] ENDOSCOPY;  Service: Gastroenterology;;   ESOPHAGOGASTRODUODENOSCOPY N/A 09/17/2021   Procedure: ESOPHAGOGASTRODUODENOSCOPY (EGD);  Surgeon: [name], MD;  Location: [facility] ENDOSCOPY;  Service: Endoscopy;  Laterality: N/A;   GIVENS CAPSULE STUDY N/A 02/02/2021   Procedure: GIVENS CAPSULE STUDY;  Surgeon: [name], MD;  Location: [facility] ENDOSCOPY;  Service: Gastroenterology;  Laterality: N/A;  WILL NEED INTERPRETER ON WHEELS;  BROTHER WANTS TO TRANSLATE   TEE WITHOUT CARDIOVERSION N/A 07/01/2017   Procedure: TRANSESOPHAGEAL ECHOCARDIOGRAM (TEE);  Surgeon: [name], MD;  Location: [facility] ORS;  Service: Cardiovascular;  Laterality: N/A;   TEE WITHOUT CARDIOVERSION N/A 07/05/2017   Procedure: TRANSESOPHAGEAL  ECHOCARDIOGRAM (TEE);  Surgeon: [name], MD;  Location: [facility] ORS;  Service: Cardiovascular;  Laterality: N/A;   TRANSURETHRAL RESECTION OF BLADDER TUMOR N/A 08/07/2021   Procedure: TRANSURETHRAL RESECTION OF BLADDER TUMOR (TURBT);  Surgeon: [name], MD;  Location: [facility] ORS;  Service: Urology;  Laterality: N/A;   URETEROSCOPY Left 08/07/2021   Procedure: DIAGNOSTIC URETEROSCOPY;  Surgeon: [name], MD;  Location: [facility] ORS;  Service: Urology;  Laterality: Left;    Social History   Socioeconomic History   Marital status: Widowed    Spouse name: Not on file   Number of children: Not on file   Years of education: Not on file   Highest education level: Not on file  Occupational History   Not on file  Tobacco Use   Smoking status: Former    Types: Cigarettes   Smokeless tobacco: Former    Types: Chew  Substance and Sexual Activity   Alcohol use: No   Drug use: No   Sexual activity: Not Currently  Other Topics Concern   Not on file  Social History Narrative   Lives with brother, POA   Social Drivers of Health   Financial Resource Strain: Not on file  Food Insecurity: Not on file  Transportation Needs: Not on file  Physical Activity: Not on file  Stress: Not on file  Social Connections: Not on file  Intimate Partner Violence: Not on file    Family History  Problem Relation Age of Onset   Diabetes Brother    Hypertension Mother    Diabetes Sister     Allergies  Allergen Reactions   Lorazepam  Shortness Of Breath and Other (See Comments)    Pt experienced adverse reaction and was transferred to ICU last time they were given Med Other reaction(Deyvi Bonanno): Other (See Comments) Pt experienced adverse reaction and was transferred to ICU last time they were given Med   Pork-Derived Products Other (See Comments)    Cultural reasons    Outpatient Medications Prior to Visit  Medication Sig   amiodarone  (PACERONE ) 200 MG tablet TAKE 1/2 TABLET BY MOUTH EVERY  DAY   atorvastatin  (LIPITOR ) 80 MG tablet TAKE 1 TABLET(80 MG) BY MOUTH DAILY   carvedilol  (COREG ) 6.25 MG tablet Take 1 tablet (6.25 mg total) by mouth 2 (two) times daily.   folic acid  (FOLVITE ) 1 MG tablet Take 1 tablet (1 mg total) by mouth every morning.   levothyroxine  (SYNTHROID ) 88 MCG tablet Take 88 mcg by mouth daily before breakfast.   losartan  (COZAAR ) 25 MG tablet TAKE 1 TABLET(25 MG) BY MOUTH DAILY   meclizine  (ANTIVERT ) 25 MG tablet TAKE 1 TABLET(25 MG) BY MOUTH THREE TIMES DAILY AS NEEDED   metFORMIN (GLUCOPHAGE-XR) 500 MG 24 hr tablet Take 500 mg by mouth daily.   pantoprazole  (PROTONIX ) 20 MG tablet TAKE 1 TABLET BY MOUTH EVERY DAY   sulfamethoxazole -trimethoprim  (BACTRIM  DS) 800-160 MG tablet Take 1 tablet by mouth 2 (two) times daily.   torsemide  (DEMADEX ) 10 MG tablet Take 1 tablet (10 mg total) by mouth 2 (two) times daily.   [DISCONTINUED] gabapentin  (NEURONTIN ) 100 MG capsule Take 1 capsule (100 mg total) by mouth  at bedtime.   No facility-administered medications prior to visit.    Review of Systems  Constitutional: Negative.  Negative for weight loss.  HENT: Negative.    Eyes: Negative.   Respiratory: Negative.  Negative for shortness of breath.   Cardiovascular:  Negative for chest pain and orthopnea.  Gastrointestinal: Negative.   Genitourinary: Negative.   Musculoskeletal:  Positive for myalgias.  Skin: Negative.   Neurological: Negative.   Endo/Heme/Allergies: Negative.        Objective:   BP (!) 106/52   Pulse 76   Temp 97.8 F (36.6 C)   Ht 5' 5 (1.651 m)   SpO2 98%   BMI 23.80 kg/m   Vitals:   03/30/24 1522  BP: (!) 106/52  Pulse: 76  Temp: 97.8 F (36.6 C)  Height: 5' 5 (1.651 m)  SpO2: 98%    Physical Exam Vitals reviewed.  Constitutional:      Appearance: Normal appearance. He is well-developed.     Comments: Asthenic build  HENT:     Head: Normocephalic.     Left Ear: There is no impacted cerumen.     Nose: Nose  normal.     Mouth/Throat:     Mouth: Mucous membranes are moist.     Pharynx: No posterior oropharyngeal erythema.  Eyes:     Extraocular Movements: Extraocular movements intact.     Pupils: Pupils are equal, round, and reactive to light.  Cardiovascular:     Rate and Rhythm: Regular rhythm.     Chest Wall: PMI is not displaced.     Pulses: Normal pulses.     Heart sounds: Normal heart sounds. No murmur heard. Pulmonary:     Effort: Pulmonary effort is normal.     Breath sounds: Normal air entry. No rhonchi or rales.  Abdominal:     General: Abdomen is flat. Bowel sounds are normal. There is no distension.     Palpations: Abdomen is soft. There is no hepatomegaly, splenomegaly or mass.     Tenderness: There is no abdominal tenderness.  Musculoskeletal:        General: Normal range of motion.     Cervical back: Normal range of motion and neck supple.     Right lower leg: No edema.     Left lower leg: No edema.  Skin:    General: Skin is warm and dry.  Neurological:     General: No focal deficit present.     Mental Status: He is alert and oriented to person, place, and time.     Cranial Nerves: No cranial nerve deficit.     Motor: No weakness.     Comments: Impaired vibration sensation BLE  Psychiatric:        Mood and Affect: Mood normal.        Behavior: Behavior normal.      Results for orders placed or performed in visit on 03/30/24  POCT CBG (Fasting - Glucose)  Result Value Ref Range   Glucose Fasting, POC 78 70 - 99 mg/dL    Recent Results (from the past 2160 hours)  CBC with Differential/Platelet     Status: Abnormal   Collection Time: 02/21/24 11:15 AM  Result Value Ref Range   WBC 5.3 4.0 - 10.5 K/uL   RBC 3.19 (L) 4.22 - 5.81 MIL/uL   Hemoglobin 9.1 (L) 13.0 - 17.0 g/dL   HCT 72.9 (L) 60.9 - 47.9 %   MCV 84.6 80.0 - 100.0 fL   MCH  28.5 26.0 - 34.0 pg   MCHC 33.7 30.0 - 36.0 g/dL   RDW 84.7 88.4 - 84.4 %   Platelets 169 150 - 400 K/uL   nRBC 0.0 0.0 -  0.2 %   Neutrophils Relative % 65 %   Neutro Abs 3.4 1.7 - 7.7 K/uL   Lymphocytes Relative 17 %   Lymphs Abs 0.9 0.7 - 4.0 K/uL   Monocytes Relative 15 %   Monocytes Absolute 0.8 0.1 - 1.0 K/uL   Eosinophils Relative 2 %   Eosinophils Absolute 0.1 0.0 - 0.5 K/uL   Basophils Relative 1 %   Basophils Absolute 0.0 0.0 - 0.1 K/uL   Immature Granulocytes 0 %   Abs Immature Granulocytes 0.02 0.00 - 0.07 K/uL    Comment: Performed at [facility], [address] [place], [place] [address]  C-reactive protein     Status: None   Collection Time: 02/21/24 11:15 AM  Result Value Ref Range   CRP 0.6 <1.0 mg/dL    Comment: Performed at [facility], [address] [place], [place] [address]  Sedimentation rate     Status: Abnormal   Collection Time: 02/21/24 11:15 AM  Result Value Ref Range   Sed Rate 26 (H) 0 - 20 mm/hr    Comment: Performed at [facility], [address] [place], [place] [address]  POCT CBG (Fasting - Glucose)     Status: Abnormal   Collection Time: 03/30/24  3:34 PM  Result Value Ref Range   Glucose Fasting, POC 78 70 - 99 mg/dL      Assessment & Plan:  As per problem list  Problem List Items Addressed This Visit   None Visit Diagnoses       Diabetes 1.5, managed as type 2 (HCC)    -  Primary   Relevant Orders   POCT CBG (Fasting - Glucose) (Completed)     Physical debility       Relevant Orders   Ambulatory referral to Home Health       Return in about 1 month (around 04/30/2024) for Gait follow up.   Total time spent: 20 minutes  Sherrill Cinderella Perry, MD  03/30/2024   This document may have been prepared by [facility] Voice Recognition software and as such may include unintentional dictation errors.

## 2024-03-30 NOTE — Progress Notes (Signed)
Pt informed

## 2024-04-03 ENCOUNTER — Telehealth: Payer: Self-pay

## 2024-04-03 NOTE — Telephone Encounter (Signed)
 Pt is postponing starting home health until 7/21.

## 2024-04-09 ENCOUNTER — Ambulatory Visit (INDEPENDENT_AMBULATORY_CARE_PROVIDER_SITE_OTHER)

## 2024-04-09 DIAGNOSIS — I371 Nonrheumatic pulmonary valve insufficiency: Secondary | ICD-10-CM | POA: Diagnosis not present

## 2024-04-09 DIAGNOSIS — Z95810 Presence of automatic (implantable) cardiac defibrillator: Secondary | ICD-10-CM

## 2024-04-09 DIAGNOSIS — I480 Paroxysmal atrial fibrillation: Secondary | ICD-10-CM

## 2024-04-09 DIAGNOSIS — I340 Nonrheumatic mitral (valve) insufficiency: Secondary | ICD-10-CM

## 2024-04-09 DIAGNOSIS — R0602 Shortness of breath: Secondary | ICD-10-CM

## 2024-04-09 DIAGNOSIS — I351 Nonrheumatic aortic (valve) insufficiency: Secondary | ICD-10-CM

## 2024-04-09 DIAGNOSIS — I361 Nonrheumatic tricuspid (valve) insufficiency: Secondary | ICD-10-CM | POA: Diagnosis not present

## 2024-04-09 DIAGNOSIS — M79661 Pain in right lower leg: Secondary | ICD-10-CM

## 2024-04-09 DIAGNOSIS — I5022 Chronic systolic (congestive) heart failure: Secondary | ICD-10-CM

## 2024-04-09 DIAGNOSIS — I255 Ischemic cardiomyopathy: Secondary | ICD-10-CM

## 2024-04-12 ENCOUNTER — Ambulatory Visit

## 2024-04-12 DIAGNOSIS — I5022 Chronic systolic (congestive) heart failure: Secondary | ICD-10-CM | POA: Diagnosis not present

## 2024-04-12 DIAGNOSIS — I255 Ischemic cardiomyopathy: Secondary | ICD-10-CM

## 2024-04-12 DIAGNOSIS — R0602 Shortness of breath: Secondary | ICD-10-CM

## 2024-04-12 DIAGNOSIS — I340 Nonrheumatic mitral (valve) insufficiency: Secondary | ICD-10-CM

## 2024-04-12 DIAGNOSIS — I480 Paroxysmal atrial fibrillation: Secondary | ICD-10-CM

## 2024-04-12 DIAGNOSIS — I351 Nonrheumatic aortic (valve) insufficiency: Secondary | ICD-10-CM | POA: Diagnosis not present

## 2024-04-12 DIAGNOSIS — M79661 Pain in right lower leg: Secondary | ICD-10-CM

## 2024-04-12 DIAGNOSIS — Z95810 Presence of automatic (implantable) cardiac defibrillator: Secondary | ICD-10-CM

## 2024-04-12 MED ORDER — TECHNETIUM TC 99M SESTAMIBI GENERIC - CARDIOLITE
11.2700 | Freq: Once | INTRAVENOUS | Status: AC | PRN
  Administered 2024-04-12: 11.27 via INTRAVENOUS

## 2024-04-12 MED ORDER — TECHNETIUM TC 99M SESTAMIBI GENERIC - CARDIOLITE
32.8000 | Freq: Once | INTRAVENOUS | Status: AC | PRN
Start: 2024-04-12 — End: 2024-04-12
  Administered 2024-04-12: 32.8 via INTRAVENOUS

## 2024-04-16 ENCOUNTER — Other Ambulatory Visit: Payer: Self-pay | Admitting: Cardiovascular Disease

## 2024-04-16 ENCOUNTER — Encounter: Payer: Self-pay | Admitting: Cardiovascular Disease

## 2024-04-16 ENCOUNTER — Ambulatory Visit (INDEPENDENT_AMBULATORY_CARE_PROVIDER_SITE_OTHER): Admitting: Cardiovascular Disease

## 2024-04-16 VITALS — BP 114/56 | HR 73 | Ht 65.0 in | Wt 139.2 lb

## 2024-04-16 DIAGNOSIS — E119 Type 2 diabetes mellitus without complications: Secondary | ICD-10-CM

## 2024-04-16 DIAGNOSIS — I255 Ischemic cardiomyopathy: Secondary | ICD-10-CM

## 2024-04-16 DIAGNOSIS — I2510 Atherosclerotic heart disease of native coronary artery without angina pectoris: Secondary | ICD-10-CM | POA: Diagnosis not present

## 2024-04-16 DIAGNOSIS — I480 Paroxysmal atrial fibrillation: Secondary | ICD-10-CM

## 2024-04-16 DIAGNOSIS — Z0130 Encounter for examination of blood pressure without abnormal findings: Secondary | ICD-10-CM

## 2024-04-16 DIAGNOSIS — I700 Atherosclerosis of aorta: Secondary | ICD-10-CM

## 2024-04-16 DIAGNOSIS — N1831 Chronic kidney disease, stage 3a: Secondary | ICD-10-CM

## 2024-04-16 DIAGNOSIS — I5043 Acute on chronic combined systolic (congestive) and diastolic (congestive) heart failure: Secondary | ICD-10-CM

## 2024-04-16 DIAGNOSIS — R0602 Shortness of breath: Secondary | ICD-10-CM | POA: Diagnosis not present

## 2024-04-16 DIAGNOSIS — I2583 Coronary atherosclerosis due to lipid rich plaque: Secondary | ICD-10-CM

## 2024-04-16 MED ORDER — AMIODARONE HCL 200 MG PO TABS: 200.0000 mg | ORAL_TABLET | Freq: Every day | ORAL | 2 refills | Status: DC

## 2024-04-16 NOTE — Progress Notes (Signed)
 Cardiology Office Note   Date:  04/16/2024   ID:  Caliph, [place]ow[place]ak [birth year], MRN [id number]  PCP:  Albina GORMAN Dine, MD  Cardiologist:  Denyse Bathe, MD      History of Present Illness: Walter Blair is a 78 y.o. male who presents for  Chief Complaint  Patient presents with   Follow-up    2 week echo,nst &u/s results     Feeling better.      Past Medical History:  Diagnosis Date   AICD (automatic cardioverter/defibrillator) present    Aortic atherosclerosis (HCC)    Atrial fibrillation (HCC)    a.) CHA2DS2-VASc = 7 (age x 2, CHF, CVA x 2, aortic plaque, T2DM). b.) rate/rhythm maintained on oral amiodarone ; chronically anticoagulated on rivaroxaban . 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 b.) TTE 07/25/2021: EF 20-25%; global HK; G3DD; severely reduced RV function; Severe BAR; mod-severe MR, severe TR, mild AR.   HLD (hyperlipidemia)    HTN (hypertension)    Hypothyroidism    IDA (iron deficiency anemia)    Ischemic cardiomyopathy    a.) TTE 06/29/2017: EF 10-15%. b.) TTE 07/25/2021: ED 20-25%.   Left-sided cerebrovascular accident (CVA) (HCC) 2004   a.) residual weakness in LEFT hand   Long term current use of anticoagulant    a.) rivaroxaban    S/P CABG x 4 2003   [place]   T2DM (type 2 diabetes mellitus) (HCC)    Thrombocytopenia (HCC)    possibly associated with underlying DIC     Past Surgical History:  Procedure Laterality Date   BLADDER INSTILLATION N/A 08/07/2021   Procedure: BLADDER INSTILLATION OF  GEMCITABINE ;  Surgeon: [name], MD;  Location: [facility] ORS;  Service: Urology;  Laterality: N/A;   CARDIAC DEFIBRILLATOR PLACEMENT     CARDIOVERSION N/A 07/01/2017   Procedure: CARDIOVERSION;  Surgeon: Bathe Denyse LABOR, MD;  Location: [facility] ORS;  Service: Cardiovascular;  Laterality: N/A;   CARDIOVERSION N/A 07/05/2017   Procedure: CARDIOVERSION;  Surgeon: Bathe Denyse LABOR, MD;  Location: [facility];  Service: Cardiovascular;  Laterality: N/A;   COLONOSCOPY WITH PROPOFOL  N/A 12/30/2020   Procedure: COLONOSCOPY WITH PROPOFOL ;  Surgeon: [name], MD;  Location: [facility] ENDOSCOPY;  Service: Gastroenterology;  Laterality: N/A;   CORONARY ARTERY BYPASS GRAFT N/A 2003   Procedure: Four-vessel CABG performed in [place]   CYSTOSCOPY W/ RETROGRADES Bilateral 08/07/2021   Procedure: CYSTOSCOPY WITH RETROGRADE PYELOGRAM;  Surgeon: [name], MD;  Location: [facility];  Service: Urology;  Laterality: Bilateral;   ESOPHAGOGASTRODUODENOSCOPY  12/30/2020   Procedure: ESOPHAGOGASTRODUODENOSCOPY (EGD);  Surgeon: [name], MD;  Location: [facility] ENDOSCOPY;  Service: Gastroenterology;;   ESOPHAGOGASTRODUODENOSCOPY N/A 09/17/2021   Procedure: ESOPHAGOGASTRODUODENOSCOPY (EGD);  Surgeon: [name], MD;  Location: [facility] ENDOSCOPY;  Service: Endoscopy;  Laterality: N/A;   GIVENS CAPSULE STUDY N/A 02/02/2021   Procedure: GIVENS CAPSULE STUDY;  Surgeon: [name], MD;  Location: [facility] ENDOSCOPY;  Service: Gastroenterology;  Laterality: N/A;  WILL NEED INTERPRETER ON WHEELS;  BROTHER WANTS  TO TRANSLATE   TEE WITHOUT CARDIOVERSION N/A 07/01/2017   Procedure: TRANSESOPHAGEAL ECHOCARDIOGRAM (TEE);  Surgeon: [name], MD;  Location: [facility];  Service: Cardiovascular;  Laterality: N/A;   TEE WITHOUT CARDIOVERSION N/A 07/05/2017   Procedure: TRANSESOPHAGEAL ECHOCARDIOGRAM (TEE);  Surgeon: [name], MD;  Location: [facility] OR[facility];  Service: Cardiovascular;  Laterality: N/A;   TRANSURETHRAL  RESECTION OF BLADDER TUMOR N/A 08/07/2021   Procedure: TRANSURETHRAL RESECTION OF BLADDER TUMOR (TURBT);  Surgeon: [name], MD;  Location: [facility];  Service: Urology;  Laterality: N/A;   URETEROSCOPY Left 08/07/2021   Procedure: DIAGNOSTIC URETEROSCOPY;  Surgeon: [name], MD;  Location: [facility];  Service: Urology;  Laterality: Left;     Current Outpatient Medications  Medication Sig Dispense Refill   atorvastatin  (LIPITOR ) 80 MG tablet TAKE 1 TABLET(80 MG) BY MOUTH DAILY 90 tablet 3   carvedilol  (COREG ) 6.25 MG tablet TAKE 1 TABLET(6.25 MG) BY MOUTH TWICE DAILY 60 tablet 11   folic acid  (FOLVITE ) 1 MG tablet Take 1 tablet (1 mg total) by mouth every morning. 90 tablet 1   gabapentin  (NEURONTIN ) 100 MG capsule Take 1 capsule (100 mg total) by mouth 3 (three) times daily. 270 capsule 0   levothyroxine  (SYNTHROID ) 88 MCG tablet Take 88 mcg by mouth daily before breakfast.     losartan  (COZAAR ) 25 MG tablet TAKE 1 TABLET(25 MG) BY MOUTH DAILY 90 tablet 3   meclizine  (ANTIVERT ) 25 MG tablet TAKE 1 TABLET(25 MG) BY MOUTH THREE TIMES DAILY AS NEEDED 135 tablet 0   metFORMIN (GLUCOPHAGE-XR) 500 MG 24 hr tablet Take 500 mg by mouth daily.     pantoprazole  (PROTONIX ) 20 MG tablet TAKE 1 TABLET BY MOUTH EVERY DAY 90 tablet 1   torsemide  (DEMADEX ) 10 MG tablet Take 1 tablet (10 mg total) by mouth 2 (two) times daily. 60 tablet 2   amiodarone  (PACERONE ) 200 MG tablet Take 1 tablet (200 mg total) by mouth daily. 45 tablet 2   carvedilol  (COREG ) 6.25 MG tablet TAKE 1 TABLET(6.25 MG) BY MOUTH TWICE DAILY 60 tablet 11   No current facility-administered medications for this visit.    Allergies:   Lorazepam  and Pork-derived products    Social History:   reports that he has quit smoking. His smoking use included cigarettes. He has quit using smokeless tobacco.  His smokeless tobacco use included chew. He reports that he does not drink alcohol and does not use drugs.   Family History:   family history includes Diabetes in his brother and sister; Hypertension in his mother.    ROS:     Review of Systems  Constitutional: Negative.   HENT: Negative.    Eyes: Negative.   Respiratory: Negative.    Gastrointestinal: Negative.   Genitourinary: Negative.   Musculoskeletal: Negative.   Skin: Negative.   Neurological: Negative.   Endo/Heme/Allergies: Negative.   Psychiatric/Behavioral: Negative.    All other systems reviewed and are negative.     All other systems are reviewed and negative.    PHYSICAL EXAM: VS:  BP (!) 114/56   Pulse 73   Ht 5' 5 (1.651 m)   Wt 139 lb 3.2 oz (63.1 kg)   SpO2 99%   BMI 23.16 kg/m  , BMI Body mass index is 23.16 kg/m. Last weight:  Wt Readings from Last 3 Encounters:  04/16/24 139 lb 3.2 oz (63.1 kg)  03/27/24 143 lb (64.9 kg)  01/16/24 143 lb (64.9 kg)     Physical  Exam Vitals reviewed.  Constitutional:      Appearance: Normal appearance. He is normal weight.  HENT:     Head: Normocephalic.     Nose: Nose normal.     Mouth/Throat:     Mouth: Mucous membranes are moist.  Eyes:     Pupils: Pupils are equal, round, and reactive to light.  Cardiovascular:     Rate and Rhythm: Normal rate and regular rhythm.     Pulses: Normal pulses.     Heart sounds: Normal heart sounds.  Pulmonary:     Effort: Pulmonary effort is normal.  Abdominal:     General: Abdomen is flat. Bowel sounds are normal.  Musculoskeletal:        General: Normal range of motion.     Cervical back: Normal range of motion.  Skin:    General: Skin is warm.  Neurological:     General: No focal deficit present.     Mental Status: He is alert.  Psychiatric:        Mood and Affect: Mood normal.       EKG:   Recent Labs: 02/21/2024: Hemoglobin 9.1; Platelets 169    Lipid Panel No results found for: CHOL, TRIG, HDL, CHOLHDL, VLDL, LDLCALC, LDLDIRECT    Other studies Reviewed: Additional studies/ records that were reviewed  today include:  Review of the above records demonstrates:       No data to display            ASSESSMENT AND PLAN:    ICD-10-CM   1. Coronary artery disease due to lipid rich plaque  I25.10    I25.83     2. PAF (paroxysmal atrial fibrillation) (HCC)  I48.0 amiodarone  (PACERONE ) 200 MG tablet   In atrial fib with controlled VR. Advise changing amiodrone 200 daily. Feeling better.    3. Ischemic cardiomyopathy  I25.5     4. SOB (shortness of breath)  R06.02    LVEF 24-26 %.  No ischaemia, infarction seen as in past.    5. PAF (paroxysmal atrial fibrillation) (HCC)  I48.0 amiodarone  (PACERONE ) 200 MG tablet       Problem List Items Addressed This Visit       Cardiovascular and Mediastinum   PAF (paroxysmal atrial fibrillation) (HCC)   Relevant Medications   amiodarone  (PACERONE ) 200 MG tablet   CAD (coronary artery disease) - Primary   Relevant Medications   amiodarone  (PACERONE ) 200 MG tablet   Ischemic cardiomyopathy   Relevant Medications   amiodarone  (PACERONE ) 200 MG tablet   Other Visit Diagnoses       SOB (shortness of breath)       LVEF 24-26 %.  No ischaemia, infarction seen as in past.          Disposition:   Return in about 5 weeks (around 05/21/2024).    Total time spent: 35 minutes  Signed,  Denyse Bathe, MD  04/16/2024 10:55 AM    [facility]

## 2024-04-25 ENCOUNTER — Encounter: Payer: Self-pay | Admitting: Internal Medicine

## 2024-04-25 ENCOUNTER — Ambulatory Visit (INDEPENDENT_AMBULATORY_CARE_PROVIDER_SITE_OTHER): Admitting: Internal Medicine

## 2024-04-25 VITALS — BP 99/60 | HR 64 | Ht 65.0 in | Wt 144.4 lb

## 2024-04-25 DIAGNOSIS — I255 Ischemic cardiomyopathy: Secondary | ICD-10-CM | POA: Diagnosis not present

## 2024-04-25 DIAGNOSIS — E1142 Type 2 diabetes mellitus with diabetic polyneuropathy: Secondary | ICD-10-CM

## 2024-04-25 DIAGNOSIS — I5022 Chronic systolic (congestive) heart failure: Secondary | ICD-10-CM

## 2024-04-25 DIAGNOSIS — E119 Type 2 diabetes mellitus without complications: Secondary | ICD-10-CM

## 2024-04-25 DIAGNOSIS — Z0130 Encounter for examination of blood pressure without abnormal findings: Secondary | ICD-10-CM

## 2024-04-25 LAB — GLUCOSE, POCT (MANUAL RESULT ENTRY): POC Glucose: 194 mg/dL — AB (ref 70–99)

## 2024-04-25 NOTE — Progress Notes (Signed)
 Established Patient Office Visit  Subjective:  Patient ID: Walter Blair, male    DOB: 08/21/1946  Age: 78 y.o. MRN: [id number]  Chief Complaint  Patient presents with   Follow-up    1 month    Paresthetic pain has improved but can only tolerate 1 tab of gabapentin  per day due to hallucinations. Worsening CHF and now wants to resume hospice care.    No other concerns at this time.   Past Medical History:  Diagnosis Date   AICD (automatic cardioverter/defibrillator) present    Aortic atherosclerosis (HCC)    Atrial fibrillation (HCC)    a.) CHA2DS2-VASc = 7 (age x 2, CHF, CVA x 2, aortic plaque, T2DM). b.) rate/rhythm maintained on oral amiodarone ; chronically anticoagulated on rivaroxaban . 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 b.) TTE 07/25/2021: EF 20-25%; global HK; G3DD; severely reduced RV function; Severe BAR; mod-severe MR, severe TR, mild AR.   HLD (hyperlipidemia)    HTN (hypertension)    Hypothyroidism    IDA (iron deficiency anemia)    Ischemic cardiomyopathy    a.) TTE 06/29/2017: EF 10-15%. b.) TTE 07/25/2021: ED 20-25%.   Left-sided cerebrovascular accident (CVA) (HCC) 2004   a.) residual weakness in LEFT hand   Long term current use of anticoagulant    a.) rivaroxaban    Walter Blair/P CABG x 4 2003   [place]formed in [place]   T2DM (type 2 diabetes mellitus) (HCC)    Thrombocytopenia (HCC)    possibly associated with underlying DIC    Past Surgical History:  Procedure Laterality Date   BLADDER INSTILLATION N/A 08/07/2021   Procedure:  BLADDER INSTILLATION OF GEMCITABINE ;  Surgeon: Walter Redell BROCKS, MD;  Location: [facility];  Service: Urology;  Laterality: N/A;   CARDIAC DEFIBRILLATOR PLACEMENT     CARDIOVERSION N/A 07/01/2017   Procedure: CARDIOVERSION;  Surgeon: Walter Denyse LABOR, MD;  Location: [facility] OR[facility];  Service: Cardiovascular;  Laterality: N/A;   CARDIOVERSION N/A 07/05/2017   Procedure: CARDIOVERSION;  Surgeon: Walter Denyse LABOR, MD;  Location: [facility] OR[facility];  Service: Cardiovascular;  Laterality: N/A;   COLONOSCOPY WITH PROPOFOL  N/A 12/30/2020   Procedure: COLONOSCOPY WITH PROPOFOL ;  Surgeon: Walter Bi, MD;  Location: [facility] ENDOSCOPY;  Service: Gastroenterology;  Laterality: N/A;   CORONARY ARTERY BYPASS GRAFT N/A 2003   Procedure: Four-vessel CABG [place] in [place]   CYSTOSCOPY W/ RETROGRADES Bilateral 08/07/2021   Procedure: CYSTOSCOPY WITH RETROGRADE PYELOGRAM;  Surgeon: Walter Redell BROCKS, MD;  Location: [facility];  Service: Urology;  Laterality: Bilateral;   ESOPHAGOGASTRODUODENOSCOPY  12/30/2020   Procedure: ESOPHAGOGASTRODUODENOSCOPY (EGD);  Surgeon: Walter Bi, MD;  Location: [facility] ENDOSCOPY;  Service: Gastroenterology;;   ESOPHAGOGASTRODUODENOSCOPY N/A 09/17/2021   Procedure: ESOPHAGOGASTRODUODENOSCOPY (EGD);  Surgeon: Walter Keene NOVAK, MD;  Location: [facility] ENDOSCOPY;  Service: Endoscopy;  Laterality: N/A;   GIVENS CAPSULE STUDY N/A 02/02/2021   Procedure: GIVENS CAPSULE STUDY;  Surgeon: Walter Bi, MD;  Location: [facility] ENDOSCOPY;  Service: Gastroenterology;  Laterality: N/A;  WILL NEED INTERPRETER ON WHEELS;  BROTHER WANTS TO TRANSLATE  TEE WITHOUT CARDIOVERSION N/A 07/01/2017   Procedure: TRANSESOPHAGEAL ECHOCARDIOGRAM (TEE);  Surgeon: Walter Denyse LABOR, MD;  Location: [facility]S;  Service: Cardiovascular;  Laterality: N/A;   TEE WITHOUT CARDIOVERSION N/A 07/05/2017   Procedure: TRANSESOPHAGEAL ECHOCARDIOGRAM (TEE);  Surgeon: Walter Denyse LABOR, MD;  Location: [facility] ORS;  Service: Cardiovascular;  Laterality: N/A;    TRANSURETHRAL RESECTION OF BLADDER TUMOR N/A 08/07/2021   Procedure: TRANSURETHRAL RESECTION OF BLADDER TUMOR (TURBT);  Surgeon: Walter Redell BROCKS, MD;  Location: [facility] ORS;  Service: Urology;  Laterality: N/A;   URETEROSCOPY Left 08/07/2021   Procedure: DIAGNOSTIC URETEROSCOPY;  Surgeon: Walter Redell BROCKS, MD;  Location: [facility] ORS;  Service: Urology;  Laterality: Left;    Social History   Socioeconomic History   Marital status: Widowed    Spouse name: Not on file   Number of children: Not on file   Years of education: Not on file   Highest education level: Not on file  Occupational History   Not on file  Tobacco Use   Smoking status: Former    Types: Cigarettes   Smokeless tobacco: Former    Types: Chew  Substance and Sexual Activity   Alcohol use: No   Drug use: No   Sexual activity: Not Currently  Other Topics Concern   Not on file  Social History Narrative   Lives with brother, POA   Social Drivers of Health   Financial Resource Strain: Not on file  Food Insecurity: Not on file  Transportation Needs: Not on file  Physical Activity: Not on file  Stress: Not on file  Social Connections: Not on file  Intimate Partner Violence: Not on file    Family History  Problem Relation Age of Onset   Diabetes Brother    Hypertension Mother    Diabetes Sister     Allergies  Allergen Reactions   Lorazepam  Shortness Of Breath and Other (See Comments)    Pt experienced adverse reaction and was transferred to ICU last time they were given Med Other reaction(Walter Blair): Other (See Comments) Pt experienced adverse reaction and was transferred to ICU last time they were given Med   Pork-Derived Products Other (See Comments)    Cultural reasons    Outpatient Medications Prior to Visit  Medication Sig   amiodarone  (PACERONE ) 200 MG tablet Take 1 tablet (200 mg total) by mouth daily.   atorvastatin  (LIPITOR ) 80 MG tablet TAKE 1 TABLET(80 MG) BY MOUTH DAILY   carvedilol  (COREG ) 6.25 MG  tablet TAKE 1 TABLET(6.25 MG) BY MOUTH TWICE DAILY   carvedilol  (COREG ) 6.25 MG tablet TAKE 1 TABLET(6.25 MG) BY MOUTH TWICE DAILY   folic acid  (FOLVITE ) 1 MG tablet Take 1 tablet (1 mg total) by mouth every morning.   gabapentin  (NEURONTIN ) 100 MG capsule Take 1 capsule (100 mg total) by mouth 3 (three) times daily.   levothyroxine  (SYNTHROID ) 88 MCG tablet Take 88 mcg by mouth daily before breakfast.   losartan  (COZAAR ) 25 MG tablet TAKE 1 TABLET(25 MG) BY MOUTH DAILY   meclizine  (ANTIVERT ) 25 MG tablet TAKE 1 TABLET(25 MG) BY MOUTH THREE TIMES DAILY AS NEEDED   metFORMIN (GLUCOPHAGE-XR) 500 MG 24 hr tablet Take 500 mg by mouth daily.   pantoprazole  (PROTONIX ) 20 MG tablet TAKE 1 TABLET BY MOUTH EVERY DAY   torsemide  (DEMADEX ) 10 MG tablet Take 1 tablet (10 mg total) by mouth 2 (two) times daily.   No facility-administered medications prior to visit.    Review of Systems  Constitutional: Negative.  Negative  for weight loss.  HENT: Negative.    Eyes: Negative.   Respiratory: Negative.  Negative for shortness of breath.   Cardiovascular:  Negative for chest pain and orthopnea.  Gastrointestinal: Negative.   Genitourinary: Negative.   Musculoskeletal:  Positive for myalgias.  Skin: Negative.   Neurological: Negative.   Endo/Heme/Allergies: Negative.        Objective:   BP 99/60   Pulse 64   Ht 5' 5 (1.651 m)   Wt 144 lb 6.4 oz (65.5 kg)   SpO2 99%   BMI 24.03 kg/m   Vitals:   04/25/24 0903  BP: 99/60  Pulse: 64  Height: 5' 5 (1.651 m)  Weight: 144 lb 6.4 oz (65.5 kg)  SpO2: 99%  BMI (Calculated): 24.03    Physical Exam Vitals reviewed.  Constitutional:      Appearance: Normal appearance. He is well-developed.     Comments: Asthenic build  HENT:     Head: Normocephalic.     Left Ear: There is no impacted cerumen.     Nose: Nose normal.     Mouth/Throat:     Mouth: Mucous membranes are moist.     Pharynx: No posterior oropharyngeal erythema.  Eyes:      Extraocular Movements: Extraocular movements intact.     Pupils: Pupils are equal, round, and reactive to light.  Cardiovascular:     Rate and Rhythm: Regular rhythm.     Chest Wall: PMI is not displaced.     Pulses: Normal pulses.     Heart sounds: Normal heart sounds. No murmur heard. Pulmonary:     Effort: Pulmonary effort is normal.     Breath sounds: Normal air entry. No rhonchi or rales.  Abdominal:     General: Abdomen is flat. Bowel sounds are normal. There is no distension.     Palpations: Abdomen is soft. There is no hepatomegaly, splenomegaly or mass.     Tenderness: There is no abdominal tenderness.  Musculoskeletal:        General: Normal range of motion.     Cervical back: Normal range of motion and neck supple.     Right lower leg: No edema.     Left lower leg: No edema.  Skin:    General: Skin is warm and dry.  Neurological:     General: No focal deficit present.     Mental Status: He is alert and oriented to person, place, and time.     Cranial Nerves: No cranial nerve deficit.     Motor: No weakness.     Comments: Impaired vibration sensation BLE  Psychiatric:        Mood and Affect: Mood normal.        Behavior: Behavior normal.      Results for orders placed or performed in visit on 04/25/24  POCT Glucose (CBG)  Result Value Ref Range   POC Glucose 194 (A) 70 - 99 mg/dl    Recent Results (from the past 2160 hours)  CBC with Differential/Platelet     Status: Abnormal   Collection Time: 02/21/24 11:15 AM  Result Value Ref Range   WBC 5.3 4.0 - 10.5 K/uL   RBC 3.19 (L) 4.22 - 5.81 MIL/uL   Hemoglobin 9.1 (L) 13.0 - 17.0 g/dL   HCT 72.9 (L) 60.9 - 47.9 %   MCV 84.6 80.0 - 100.0 fL   MCH 28.5 26.0 - 34.0 pg   MCHC 33.7 30.0 - 36.0 g/dL   RDW 84.7  11.5 - 15.5 %   Platelets 169 150 - 400 K/uL   nRBC 0.0 0.0 - 0.2 %   Neutrophils Relative % 65 %   Neutro Abs 3.4 1.7 - 7.7 K/uL   Lymphocytes Relative 17 %   Lymphs Abs 0.9 0.7 - 4.0 K/uL   Monocytes  Relative 15 %   Monocytes Absolute 0.8 0.1 - 1.0 K/uL   Eosinophils Relative 2 %   Eosinophils Absolute 0.1 0.0 - 0.5 K/uL   Basophils Relative 1 %   Basophils Absolute 0.0 0.0 - 0.1 K/uL   Immature Granulocytes 0 %   Abs Immature Granulocytes 0.02 0.00 - 0.07 K/uL    Comment: Performed at [facility], [address] [place], [place] [address]  C-reactive protein     Status: None   Collection Time: 02/21/24 11:15 AM  Result Value Ref Range   CRP 0.6 <1.0 mg/dL    Comment: Performed at [facility], [address], [place], [place] [address]  Sedimentation rate     Status: Abnormal   Collection Time: 02/21/24 11:15 AM  Result Value Ref Range   Sed Rate 26 (H) 0 - 20 mm/hr    Comment: Performed at [facility], [address] [place], [place] [address]  POCT CBG (Fasting - Glucose)     Status: Abnormal   Collection Time: 03/30/24  3:34 PM  Result Value Ref Range   Glucose Fasting, POC 78 70 - 99 mg/dL  POCT Glucose (CBG)     Status: Abnormal   Collection Time: 04/25/24  9:09 AM  Result Value Ref Range   POC Glucose 194 (A) 70 - 99 mg/dl      Assessment & Plan:  Walter Blair was seen today for follow-up.  Diabetic polyneuropathy associated with type 2 diabetes mellitus (HCC) -     Comprehensive metabolic panel with GFR -     Lipid panel -     Hemoglobin A1c  Diabetes mellitus without complication (HCC) -     POCT glucose (manual entry) -     Ambulatory referral to Home Health  Ischemic cardiomyopathy Overview: a.) TTE 06/29/2017: EF 10-15%. b.) TTE 07/25/2021: ED 20-25%.   Chronic systolic congestive heart failure (HCC) -     Ambulatory referral to Home Health  Refer to hospice.  Problem List Items Addressed This Visit       Cardiovascular and Mediastinum   Ischemic cardiomyopathy   Chronic systolic congestive heart failure (HCC)   Relevant Orders   Ambulatory referral to Home Health     Endocrine   Diabetes mellitus without  complication (HCC)   Relevant Orders   POCT Glucose (CBG) (Completed)   Ambulatory referral to Home Health   Other Visit Diagnoses       Diabetic polyneuropathy associated with type 2 diabetes mellitus (HCC)    -  Primary   Relevant Orders   Comprehensive metabolic panel with GFR   Lipid panel   Hemoglobin A1c       Return in about 3 months (around 07/26/2024) for awv with labs prior.   Total time spent: 30 minutes  Sherrill Cinderella Perry, MD  04/25/2024   This document may have been prepared by [facility] Voice Recognition software and as such may include unintentional dictation errors.

## 2024-04-26 ENCOUNTER — Telehealth: Payer: Self-pay

## 2024-04-26 NOTE — Telephone Encounter (Signed)
 Please send hospice referral to [facility]. Pt's brother called requesting [facility] instead.

## 2024-04-27 ENCOUNTER — Ambulatory Visit: Admitting: Internal Medicine

## 2024-04-30 ENCOUNTER — Telehealth: Payer: Self-pay

## 2024-04-30 NOTE — Telephone Encounter (Signed)
 Patient brother called asking for call back about the rx for hospice, will call back for clarification on what his request was .

## 2024-05-07 ENCOUNTER — Telehealth: Payer: Self-pay

## 2024-05-07 NOTE — Telephone Encounter (Signed)
 Tonya with [facility] called asking for confirmation of the order that was placed for the patient and they said it was placed under home health for hospice services, confirmed with TJ that hospice care was what was ordered,  verbal order confirmation  was given to kristen since Bascom was busy at the time of the call.

## 2024-05-09 ENCOUNTER — Telehealth: Payer: Self-pay | Admitting: Internal Medicine

## 2024-05-09 NOTE — Telephone Encounter (Signed)
 Bascom with [facility] left VM requesting if Walter Blair will be the attending physician for this patient's hospice care and if so, will he give a certificate of terminal illness (stating he has a life expectancy of at most 6 more months). This can be given verbally over the phone. Please advise.  Callback # [phone]

## 2024-05-28 ENCOUNTER — Ambulatory Visit (INDEPENDENT_AMBULATORY_CARE_PROVIDER_SITE_OTHER): Admitting: Cardiovascular Disease

## 2024-05-28 ENCOUNTER — Encounter: Payer: Self-pay | Admitting: Cardiovascular Disease

## 2024-05-28 VITALS — BP 90/48 | HR 74 | Ht 65.0 in | Wt 144.0 lb

## 2024-05-28 DIAGNOSIS — I351 Nonrheumatic aortic (valve) insufficiency: Secondary | ICD-10-CM

## 2024-05-28 DIAGNOSIS — I255 Ischemic cardiomyopathy: Secondary | ICD-10-CM | POA: Diagnosis not present

## 2024-05-28 DIAGNOSIS — Z95810 Presence of automatic (implantable) cardiac defibrillator: Secondary | ICD-10-CM

## 2024-05-28 DIAGNOSIS — I5043 Acute on chronic combined systolic (congestive) and diastolic (congestive) heart failure: Secondary | ICD-10-CM

## 2024-05-28 DIAGNOSIS — R0602 Shortness of breath: Secondary | ICD-10-CM | POA: Diagnosis not present

## 2024-05-28 DIAGNOSIS — I340 Nonrheumatic mitral (valve) insufficiency: Secondary | ICD-10-CM

## 2024-05-28 NOTE — Progress Notes (Signed)
 Cardiology Office Note   Date:  05/28/2024   ID:  Walter Blair, Walter Blair 04-29-1946, MRN [id number]  PCP:  Albina GORMAN Dine, MD  Cardiologist:  Denyse Bathe, MD      History of Present Illness: Walter Blair is a 78 y.o. male who presents for  Chief Complaint  Patient presents with   Follow-up    5 Weeks Follow Up    Getting slow in walking, get SOB on exertion.      Past Medical History:  Diagnosis Date   AICD (automatic cardioverter/defibrillator) present    Aortic atherosclerosis (HCC)    Atrial fibrillation (HCC)    a.) CHA2DS2-VASc = 7 (age x 2, CHF, CVA x 2, aortic plaque, T2DM). b.) rate/rhythm maintained on oral amiodarone ; chronically anticoagulated on rivaroxaban . 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 b.) TTE 07/25/2021: EF 20-25%; global HK; G3DD; severely reduced RV function; Severe BAR; mod-severe MR, severe TR, mild AR.   HLD (hyperlipidemia)    HTN (hypertension)    Hypothyroidism    IDA (iron deficiency anemia)    Ischemic cardiomyopathy    a.) TTE 06/29/2017: EF 10-15%. b.) TTE 07/25/2021: ED 20-25%.   Left-sided cerebrovascular accident (CVA) (HCC) 2004   a.) residual weakness in LEFT hand   Long term current use of anticoagulant    a.) rivaroxaban    S/P CABG x 4 2003   [place] in [place]   T2DM (type 2 diabetes mellitus) (HCC)    Thrombocytopenia (HCC)    possibly associated with underlying DIC     Past Surgical History:  Procedure Laterality Date   BLADDER INSTILLATION N/A 08/07/2021   Procedure: BLADDER  INSTILLATION OF GEMCITABINE ;  Surgeon: [name], MD;  Location: [facility] ORS;  Service: Urology;  Laterality: N/A;   CARDIAC DEFIBRILLATOR PLACEMENT     CARDIOVERSION N/A 07/01/2017   Procedure: CARDIOVERSION;  Surgeon: Bathe Denyse LABOR, MD;  Location: [facility] ORS;  Service: Cardiovascular;  Laterality: N/A;   CARDIOVERSION N/A 07/05/2017   Procedure: CARDIOVERSION;  Surgeon: Bathe Denyse LABOR, MD;  Location: [facility];  Service: Cardiovascular;  Laterality: N/A;   COLONOSCOPY WITH PROPOFOL  N/A 12/30/2020   Procedure: COLONOSCOPY WITH PROPOFOL ;  Surgeon: [name], MD;  Location: [facility] ENDOSCOPY;  Service: Gastroenterology;  Laterality: N/A;   CORONARY ARTERY BYPASS GRAFT N/A 2003   Procedure: Four-vessel CABG performed in [place]   CYSTOSCOPY W/ RETROGRADES Bilateral 08/07/2021   Procedure: CYSTOSCOPY WITH RETROGRADE PYELOGRAM;  Surgeon: [name], MD;  Location: [facility];  Service: Urology;  Laterality: Bilateral;   ESOPHAGOGASTRODUODENOSCOPY  12/30/2020   Procedure: ESOPHAGOGASTRODUODENOSCOPY (EGD);  Surgeon: [name], MD;  Location: [facility] ENDOSCOPY;  Service: Gastroenterology;;   ESOPHAGOGASTRODUODENOSCOPY N/A 09/17/2021   Procedure: ESOPHAGOGASTRODUODENOSCOPY (EGD);  Surgeon: [name], MD;  Location: [facility] ENDOSCOPY;  Service: Endoscopy;  Laterality: N/A;   GIVENS CAPSULE STUDY N/A 02/02/2021   Procedure: GIVENS CAPSULE STUDY;  Surgeon: [name], MD;  Location: [facility] ENDOSCOPY;  Service: Gastroenterology;  Laterality: N/A;  WILL NEED INTERPRETER ON  WHEELS;  BROTHER WANTS TO TRANSLATE   TEE WITHOUT CARDIOVERSION N/A 07/01/2017   Procedure: TRANSESOPHAGEAL ECHOCARDIOGRAM (TEE);  Surgeon: [name], MD;  Location: [facility] ORS;  Service: Cardiovascular;  Laterality: N/A;   TEE WITHOUT CARDIOVERSION N/A 07/05/2017   Procedure: TRANSESOPHAGEAL ECHOCARDIOGRAM (TEE);  Surgeon: [name], MD;  Location: [facility] ORS;  Service: Cardiovascular;  Laterality: N/A;    TRANSURETHRAL RESECTION OF BLADDER TUMOR N/A 08/07/2021   Procedure: TRANSURETHRAL RESECTION OF BLADDER TUMOR (TURBT);  Surgeon: [name], MD;  Location: [facility] ORS;  Service: Urology;  Laterality: N/A;   URETEROSCOPY Left 08/07/2021   Procedure: DIAGNOSTIC URETEROSCOPY;  Surgeon: [name], MD;  Location: [facility] ORS;  Service: Urology;  Laterality: Left;     Current Outpatient Medications  Medication Sig Dispense Refill   amiodarone  (PACERONE ) 200 MG tablet Take 1 tablet (200 mg total) by mouth daily. 45 tablet 2   atorvastatin  (LIPITOR ) 80 MG tablet TAKE 1 TABLET(80 MG) BY MOUTH DAILY 90 tablet 3   carvedilol  (COREG ) 6.25 MG tablet TAKE 1 TABLET(6.25 MG) BY MOUTH TWICE DAILY 60 tablet 11   carvedilol  (COREG ) 6.25 MG tablet TAKE 1 TABLET(6.25 MG) BY MOUTH TWICE DAILY 60 tablet 11   folic acid  (FOLVITE ) 1 MG tablet Take 1 tablet (1 mg total) by mouth every morning. 90 tablet 1   gabapentin  (NEURONTIN ) 100 MG capsule Take 1 capsule (100 mg total) by mouth 3 (three) times daily. 270 capsule 0   levothyroxine  (SYNTHROID ) 88 MCG tablet Take 88 mcg by mouth daily before breakfast.     losartan  (COZAAR ) 25 MG tablet TAKE 1 TABLET(25 MG) BY MOUTH DAILY 90 tablet 3   meclizine  (ANTIVERT ) 25 MG tablet TAKE 1 TABLET(25 MG) BY MOUTH THREE TIMES DAILY AS NEEDED 135 tablet 0   metFORMIN (GLUCOPHAGE-XR) 500 MG 24 hr tablet Take 500 mg by mouth daily.     pantoprazole  (PROTONIX ) 20 MG tablet TAKE 1 TABLET BY MOUTH EVERY DAY 90 tablet 1   torsemide  (DEMADEX ) 10 MG tablet Take 1 tablet (10 mg total) by mouth 2 (two) times daily. 60 tablet 2   No current facility-administered medications for this visit.    Allergies:   Lorazepam  and Pork-derived products    Social History:   reports that he has quit smoking. His smoking use included cigarettes. He has quit using smokeless tobacco.  His smokeless tobacco use included chew. He reports that he does not drink alcohol and does not use drugs.    Family History:  family history includes Diabetes in his brother and sister; Hypertension in his mother.    ROS:     Review of Systems  Constitutional: Negative.   HENT: Negative.    Eyes: Negative.   Respiratory: Negative.    Gastrointestinal: Negative.   Genitourinary: Negative.   Musculoskeletal: Negative.   Skin: Negative.   Neurological: Negative.   Endo/Heme/Allergies: Negative.   Psychiatric/Behavioral: Negative.    All other systems reviewed and are negative.     All other systems are reviewed and negative.    PHYSICAL EXAM: VS:  BP (!) 90/48   Pulse 74   Ht 5' 5 (1.651 m)   Wt 144 lb (65.3 kg)   SpO2 97%   BMI 23.96 kg/m  , BMI Body mass index is 23.96 kg/m. Last weight:  Wt Readings from Last 3 Encounters:  05/28/24 144 lb (65.3 kg)  04/25/24 144 lb 6.4 oz (65.5 kg)  04/16/24 139 lb 3.2 oz (63.1 kg)  Physical Exam Vitals reviewed.  Constitutional:      Appearance: Normal appearance. He is normal weight.  HENT:     Head: Normocephalic.     Nose: Nose normal.     Mouth/Throat:     Mouth: Mucous membranes are moist.  Eyes:     Pupils: Pupils are equal, round, and reactive to light.  Cardiovascular:     Rate and Rhythm: Normal rate and regular rhythm.     Pulses: Normal pulses.     Heart sounds: Normal heart sounds.  Pulmonary:     Effort: Pulmonary effort is normal.  Abdominal:     General: Abdomen is flat. Bowel sounds are normal.  Musculoskeletal:        General: Normal range of motion.     Cervical back: Normal range of motion.  Skin:    General: Skin is warm.  Neurological:     General: No focal deficit present.     Mental Status: He is alert.  Psychiatric:        Mood and Affect: Mood normal.       EKG:   Recent Labs: 02/21/2024: Hemoglobin 9.1; Platelets 169    Lipid Panel No results found for: CHOL, TRIG, HDL, CHOLHDL, VLDL, LDLCALC, LDLDIRECT    Other studies Reviewed: Additional studies/ records  that were reviewed today include:  Review of the above records demonstrates:       No data to display            ASSESSMENT AND PLAN:    ICD-10-CM   1. Ischemic cardiomyopathy  I25.5    LVEF decreased on last echo. Stress test LVEF 26%, No ischaemia, fixed defect.    2. Acute on chronic combined systolic and diastolic congestive heart failure (HCC)  I50.43     3. SOB (shortness of breath)  R06.02     4. AICD (automatic cardioverter/defibrillator) present  Z95.810     5. Nonrheumatic aortic valve insufficiency  I35.1     6. Nonrheumatic mitral valve regurgitation  I34.0        Problem List Items Addressed This Visit       Cardiovascular and Mediastinum   Ischemic cardiomyopathy - Primary   Acute exacerbation of CHF (congestive heart failure) (HCC)     Other   AICD (automatic cardioverter/defibrillator) present   Other Visit Diagnoses       SOB (shortness of breath)         Nonrheumatic aortic valve insufficiency         Nonrheumatic mitral valve regurgitation              Disposition:   Return in about 2 months (around 07/28/2024) for set up to see EP(Electophysiologist at [facility] for AICD, battery replacement.    Total time spent: 30 minutes  Signed,  Denyse Bathe, MD  05/28/2024 11:39 AM    [facility]

## 2024-07-25 ENCOUNTER — Ambulatory Visit (INDEPENDENT_AMBULATORY_CARE_PROVIDER_SITE_OTHER): Admitting: Internal Medicine

## 2024-07-25 VITALS — BP 108/60 | HR 61 | Temp 97.3°F | Ht 65.0 in | Wt 148.2 lb

## 2024-07-25 DIAGNOSIS — N1831 Chronic kidney disease, stage 3a: Secondary | ICD-10-CM | POA: Insufficient documentation

## 2024-07-25 DIAGNOSIS — E1142 Type 2 diabetes mellitus with diabetic polyneuropathy: Secondary | ICD-10-CM | POA: Diagnosis not present

## 2024-07-25 DIAGNOSIS — E1165 Type 2 diabetes mellitus with hyperglycemia: Secondary | ICD-10-CM

## 2024-07-25 DIAGNOSIS — I872 Venous insufficiency (chronic) (peripheral): Secondary | ICD-10-CM

## 2024-07-25 DIAGNOSIS — Z0130 Encounter for examination of blood pressure without abnormal findings: Secondary | ICD-10-CM

## 2024-07-25 LAB — POCT CBG (FASTING - GLUCOSE)-MANUAL ENTRY: Glucose Fasting, POC: 227 mg/dL — AB (ref 70–99)

## 2024-07-25 NOTE — Progress Notes (Signed)
 Established Patient Office Visit  Subjective:  Patient ID: Walter Blair, male    DOB: 1946/02/16  Age: 78 y.o. MRN: [id number]  Chief Complaint  Patient presents with   Follow-up    3 month lab results    No new complaints, here for lab review and medication refills. Reports increased belching.     No other concerns at this time.   Past Medical History:  Diagnosis Date   AICD (automatic cardioverter/defibrillator) present    Aortic atherosclerosis    Atrial fibrillation (HCC)    a.) CHA2DS2-VASc = 7 (age x 2, CHF, CVA x 2, aortic plaque, T2DM). b.) rate/rhythm maintained on oral amiodarone ; chronically anticoagulated on rivaroxaban . 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 b.) TTE 07/25/2021: EF 20-25%; global HK; G3DD; severely reduced RV function; Severe BAR; mod-severe MR, severe TR, mild AR.   HLD (hyperlipidemia)    HTN (hypertension)    Hypothyroidism    IDA (iron deficiency anemia)    Ischemic cardiomyopathy    a.) TTE 06/29/2017: EF 10-15%. b.) TTE 07/25/2021: ED 20-25%.   Left-sided cerebrovascular accident (CVA) (HCC) 2004   a.) residual weakness in LEFT hand   Long term current use of anticoagulant    a.) rivaroxaban    Rolf Fells/P CABG x 4 2003   [place]formed in [place]   T2DM (type 2 diabetes mellitus) (HCC)    Thrombocytopenia    possibly associated with underlying DIC    Past Surgical History:  Procedure Laterality Date   BLADDER INSTILLATION N/A 08/07/2021   Procedure: BLADDER INSTILLATION OF GEMCITABINE ;  Surgeon: [name],  MD;  Location: [facility] ORS;  Service: Urology;  Laterality: N/A;   CARDIAC DEFIBRILLATOR PLACEMENT     CARDIOVERSION N/A 07/01/2017   Procedure: CARDIOVERSION;  Surgeon: [name], MD;  Location: [facility] ORS;  Service: Cardiovascular;  Laterality: N/A;   CARDIOVERSION N/A 07/05/2017   Procedure: CARDIOVERSION;  Surgeon: [name], MD;  Location: [facility] ORS;  Service: Cardiovascular;  Laterality: N/A;   COLONOSCOPY WITH PROPOFOL  N/A 12/30/2020   Procedure: COLONOSCOPY WITH PROPOFOL ;  Surgeon: [name], MD;  Location: [facility] ENDOSCOPY;  Service: Gastroenterology;  Laterality: N/A;   CORONARY ARTERY BYPASS GRAFT N/A 2003   Procedure: Four-vessel CABG performed in [place]   CYSTOSCOPY W/ RETROGRADES Bilateral 08/07/2021   Procedure: CYSTOSCOPY WITH RETROGRADE PYELOGRAM;  Surgeon: [name], MD;  Location: [facility] ORS;  Service: Urology;  Laterality: Bilateral;   ESOPHAGOGASTRODUODENOSCOPY  12/30/2020   Procedure: ESOPHAGOGASTRODUODENOSCOPY (EGD);  Surgeon: [name], MD;  Location: [facility] ENDOSCOPY;  Service: Gastroenterology;;   ESOPHAGOGASTRODUODENOSCOPY N/A 09/17/2021   Procedure: ESOPHAGOGASTRODUODENOSCOPY (EGD);  Surgeon: [name], MD;  Location: [facility] ENDOSCOPY;  Service: Endoscopy;  Laterality: N/A;   GIVENS CAPSULE STUDY N/A 02/02/2021   Procedure: GIVENS CAPSULE STUDY;  Surgeon: [name], MD;  Location: [facility] ENDOSCOPY;  Service: Gastroenterology;  Laterality: N/A;  WILL NEED INTERPRETER ON WHEELS;  BROTHER WANTS TO TRANSLATE   TEE WITHOUT CARDIOVERSION N/A 07/01/2017   Procedure: TRANSESOPHAGEAL ECHOCARDIOGRAM (TEE);  Surgeon: [name], MD;  Location: [facility] ORS;  Service: Cardiovascular;  Laterality: N/A;   TEE WITHOUT CARDIOVERSION N/A 07/05/2017   Procedure: TRANSESOPHAGEAL ECHOCARDIOGRAM (TEE);  Surgeon: [name], MD;  Location: [facility] ORS;  Service: Cardiovascular;  Laterality: N/A;   TRANSURETHRAL RESECTION OF BLADDER TUMOR N/A 08/07/2021    Procedure: TRANSURETHRAL RESECTION OF BLADDER TUMOR (TURBT);  Surgeon: [name], MD;  Location: [facility] ORS;  Service: Urology;  Laterality: N/A;   URETEROSCOPY Left 08/07/2021   Procedure: DIAGNOSTIC URETEROSCOPY;  Surgeon: [name], MD;  Location: [facility] ORS;  Service: Urology;  Laterality: Left;    Social History   Socioeconomic History   Marital status: Widowed    Spouse name: Not on file   Number of children: Not on file   Years of education: Not on file   Highest education level: Not on file  Occupational History   Not on file  Tobacco Use   Smoking status: Former    Types: Cigarettes   Smokeless tobacco: Former    Types: Chew  Substance and Sexual Activity   Alcohol use: No   Drug use: No   Sexual activity: Not Currently  Other Topics Concern   Not on file  Social History Narrative   Lives with brother, POA   Social Drivers of Health   Financial Resource Strain: Not on file  Food Insecurity: Not on file  Transportation Needs: Not on file  Physical Activity: Not on file  Stress: Not on file  Social Connections: Not on file  Intimate Partner Violence: Not on file    Family History  Problem Relation Age of Onset   Diabetes Brother    Hypertension Mother    Diabetes Sister     Allergies  Allergen Reactions   Lorazepam  Shortness Of Breath and Other (See Comments)    Pt experienced adverse reaction and was transferred to ICU last time they were given Med Other reaction(Satcha Storlie): Other (See Comments) Pt experienced adverse reaction and was transferred to ICU last time they were given Med   Porcine (Pork) Protein-Containing Drug Products Other (See Comments)    Cultural reasons    Outpatient Medications Prior to Visit  Medication Sig   amiodarone  (PACERONE ) 200 MG tablet Take 1 tablet (200 mg total) by mouth daily.   atorvastatin  (LIPITOR ) 80 MG tablet TAKE 1 TABLET(80 MG) BY MOUTH DAILY   carvedilol  (COREG ) 6.25 MG tablet TAKE 1 TABLET(6.25 MG) BY  MOUTH TWICE DAILY   carvedilol  (COREG ) 6.25 MG tablet TAKE 1 TABLET(6.25 MG) BY MOUTH TWICE DAILY   folic acid  (FOLVITE ) 1 MG tablet Take 1 tablet (1 mg total) by mouth every morning.   gabapentin  (NEURONTIN ) 100 MG capsule Take 1 capsule (100 mg total) by mouth 3 (three) times daily.   levothyroxine  (SYNTHROID ) 88 MCG tablet Take 88 mcg by mouth daily before breakfast.   losartan  (COZAAR ) 25 MG tablet TAKE 1 TABLET(25 MG) BY MOUTH DAILY   meclizine  (ANTIVERT ) 25 MG tablet TAKE 1 TABLET(25 MG) BY MOUTH THREE TIMES DAILY AS NEEDED   metFORMIN (GLUCOPHAGE-XR) 500 MG 24 hr tablet Take 500 mg by mouth daily.   pantoprazole  (PROTONIX ) 20 MG tablet TAKE 1 TABLET BY MOUTH EVERY DAY   torsemide  (DEMADEX ) 10 MG tablet Take 1 tablet (10 mg total) by mouth 2 (two) times daily.   No facility-administered medications prior to visit.    Review of Systems  Constitutional: Negative.  Negative for weight loss (gained 4 lbs).  HENT: Negative.  Eyes: Negative.   Respiratory: Negative.  Negative for shortness of breath.   Cardiovascular:  Negative for chest pain and orthopnea.  Gastrointestinal: Negative.   Genitourinary: Negative.   Musculoskeletal:  Positive for myalgias.  Skin: Negative.   Neurological: Negative.   Endo/Heme/Allergies: Negative.        Objective:   BP 108/60   Pulse 61   Temp (!) 97.3 F (36.3 C)   Ht 5' 5 (1.651 m)   Wt 148 lb 3.2 oz (67.2 kg)   SpO2 99%   BMI 24.66 kg/m   Vitals:   07/25/24 1014  BP: 108/60  Pulse: 61  Temp: (!) 97.3 F (36.3 C)  Height: 5' 5 (1.651 m)  Weight: 148 lb 3.2 oz (67.2 kg)  SpO2: 99%  BMI (Calculated): 24.66    Physical Exam Vitals reviewed.  Constitutional:      Appearance: Normal appearance. He is well-developed.     Comments: Asthenic build  HENT:     Head: Normocephalic.     Left Ear: There is no impacted cerumen.     Nose: Nose normal.     Mouth/Throat:     Mouth: Mucous membranes are moist.     Pharynx: No  posterior oropharyngeal erythema.  Eyes:     Extraocular Movements: Extraocular movements intact.     Pupils: Pupils are equal, round, and reactive to light.  Cardiovascular:     Rate and Rhythm: Regular rhythm.     Chest Wall: PMI is not displaced.     Pulses: Normal pulses.     Heart sounds: Normal heart sounds. No murmur heard. Pulmonary:     Effort: Pulmonary effort is normal.     Breath sounds: Normal air entry. No rhonchi or rales.  Abdominal:     General: Abdomen is flat. Bowel sounds are normal. There is no distension.     Palpations: Abdomen is soft. There is no hepatomegaly, splenomegaly or mass.     Tenderness: There is no abdominal tenderness.  Musculoskeletal:        General: Normal range of motion.     Cervical back: Normal range of motion and neck supple.     Right lower leg: 1+ Edema present.     Left lower leg: 1+ Edema present.  Skin:    General: Skin is warm and dry.     Comments: Bilateral hemosiderin changes both legs from ankle to shin  Neurological:     General: No focal deficit present.     Mental Status: He is alert and oriented to person, place, and time.     Cranial Nerves: No cranial nerve deficit.     Motor: No weakness.     Comments: Impaired vibration sensation BLE  Psychiatric:        Mood and Affect: Mood normal.        Behavior: Behavior normal.      Results for orders placed or performed in visit on 07/25/24  POCT CBG (Fasting - Glucose)  Result Value Ref Range   Glucose Fasting, POC 227 (A) 70 - 99 mg/dL    Recent Results (from the past 2160 hours)  POCT CBG (Fasting - Glucose)     Status: Abnormal   Collection Time: 07/25/24 10:19 AM  Result Value Ref Range   Glucose Fasting, POC 227 (A) 70 - 99 mg/dL      Assessment & Plan:  Walter Blair was seen today for follow-up.  Diabetic polyneuropathy associated with type 2 diabetes mellitus (HCC)  Type 2 diabetes mellitus with hyperglycemia, without long-term current use of insulin   (HCC) -     POCT CBG (Fasting - Glucose) -     Lipid panel  CKD stage 3a, GFR 45-59 ml/min (HCC) -     Comprehensive metabolic panel with GFR  Chronic venous insufficiency   Recommended compression stockings. Problem List Items Addressed This Visit       Cardiovascular and Mediastinum   Chronic venous insufficiency     Endocrine   Diabetic polyneuropathy associated with type 2 diabetes mellitus (HCC) - Primary     Genitourinary   CKD stage 3a, GFR 45-59 ml/min (HCC)   Relevant Orders   Comprehensive metabolic panel with GFR   Other Visit Diagnoses       Type 2 diabetes mellitus with hyperglycemia, without long-term current use of insulin  (HCC)       Relevant Orders   POCT CBG (Fasting - Glucose) (Completed)   Lipid panel       Return in about 3 months (around 10/25/2024) for fu with labs prior.   Total time spent: 20 minutes. This time includes review of previous notes and results and patient face to face interaction during today'Samiha Denapoli visit.    Sherrill Cinderella Perry, MD  07/25/2024   This document may have been prepared by [facility] Voice Recognition software and as such may include unintentional dictation errors.

## 2024-07-30 ENCOUNTER — Ambulatory Visit (INDEPENDENT_AMBULATORY_CARE_PROVIDER_SITE_OTHER): Admitting: Cardiovascular Disease

## 2024-07-30 ENCOUNTER — Ambulatory Visit: Admitting: Internal Medicine

## 2024-07-30 ENCOUNTER — Encounter: Payer: Self-pay | Admitting: Cardiovascular Disease

## 2024-07-30 VITALS — BP 122/64 | HR 58 | Ht 65.0 in | Wt 150.6 lb

## 2024-07-30 DIAGNOSIS — I480 Paroxysmal atrial fibrillation: Secondary | ICD-10-CM | POA: Diagnosis not present

## 2024-07-30 DIAGNOSIS — I340 Nonrheumatic mitral (valve) insufficiency: Secondary | ICD-10-CM

## 2024-07-30 DIAGNOSIS — R0602 Shortness of breath: Secondary | ICD-10-CM

## 2024-07-30 DIAGNOSIS — Z95810 Presence of automatic (implantable) cardiac defibrillator: Secondary | ICD-10-CM | POA: Diagnosis not present

## 2024-07-30 DIAGNOSIS — I351 Nonrheumatic aortic (valve) insufficiency: Secondary | ICD-10-CM

## 2024-07-30 DIAGNOSIS — I5022 Chronic systolic (congestive) heart failure: Secondary | ICD-10-CM | POA: Diagnosis not present

## 2024-07-30 DIAGNOSIS — M79661 Pain in right lower leg: Secondary | ICD-10-CM

## 2024-07-30 DIAGNOSIS — M79662 Pain in left lower leg: Secondary | ICD-10-CM

## 2024-07-30 DIAGNOSIS — Z0130 Encounter for examination of blood pressure without abnormal findings: Secondary | ICD-10-CM

## 2024-07-30 DIAGNOSIS — I255 Ischemic cardiomyopathy: Secondary | ICD-10-CM

## 2024-07-30 MED ORDER — TORSEMIDE 10 MG PO TABS: 10.0000 mg | ORAL_TABLET | Freq: Two times a day (BID) | ORAL | 2 refills | Status: AC

## 2024-07-30 MED ORDER — AMIODARONE HCL 200 MG PO TABS: 200.0000 mg | ORAL_TABLET | Freq: Two times a day (BID) | ORAL | 11 refills | Status: DC

## 2024-07-30 NOTE — Progress Notes (Signed)
 Cardiology Office Note   Date:  07/30/2024   ID:  Stephanie, Mcglone [birth year], MRN [id number]  PCP:  Albina GORMAN Dine, MD  Cardiologist:  Denyse Bathe, MD      History of Present Illness: Walter Blair is a 78 y.o. male who presents for  Chief Complaint  Patient presents with   Follow-up    Follow up    Gets SOB with exercise.      Past Medical History:  Diagnosis Date   AICD (automatic cardioverter/defibrillator) present    Aortic atherosclerosis    Atrial fibrillation (HCC)    a.) CHA2DS2-VASc = 7 (age x 2, CHF, CVA x 2, aortic plaque, T2DM). b.) rate/rhythm maintained on oral amiodarone ; chronically anticoagulated on rivaroxaban . 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 b.) TTE 07/25/2021: EF 20-25%; global HK; G3DD; severely reduced RV function; Severe BAR; mod-severe MR, severe TR, mild AR.   HLD (hyperlipidemia)    HTN (hypertension)    Hypothyroidism    IDA (iron deficiency anemia)    Ischemic cardiomyopathy    a.) TTE 06/29/2017: EF 10-15%. b.) TTE 07/25/2021: ED 20-25%.   Left-sided cerebrovascular accident (CVA) (HCC) 2004   a.) residual weakness in LEFT hand   Long term current use of anticoagulant    a.) rivaroxaban    S/P CABG x 4 2003   Performed in [place]   T2DM (type 2 diabetes mellitus) (HCC)    Thrombocytopenia    possibly associated with underlying DIC     Past Surgical History:  Procedure Laterality Date   BLADDER INSTILLATION N/A 08/07/2021   Procedure: BLADDER INSTILLATION OF GEMCITABINE ;  Surgeon:  [name], MD;  Location: [facility] ORS;  Service: Urology;  Laterality: N/A;   CARDIAC DEFIBRILLATOR PLACEMENT     CARDIOVERSION N/A 07/01/2017   Procedure: CARDIOVERSION;  Surgeon: Bathe Denyse LABOR, MD;  Location: [facility];  Service: Cardiovascular;  Laterality: N/A;   CARDIOVERSION N/A 07/05/2017   Procedure: CARDIOVERSION;  Surgeon: Bathe Denyse LABOR, MD;  Location: [facility];  Service: Cardiovascular;  Laterality: N/A;   COLONOSCOPY WITH PROPOFOL  N/A 12/30/2020   Procedure: COLONOSCOPY WITH PROPOFOL ;  Surgeon: [name], MD;  Location: [facility] ENDOSCOPY;  Service: Gastroenterology;  Laterality: N/A;   CORONARY ARTERY BYPASS GRAFT N/A 2003   Procedure: Four-vessel CABG performed in [place]   CYSTOSCOPY W/ RETROGRADES Bilateral 08/07/2021   Procedure: CYSTOSCOPY WITH RETROGRADE PYELOGRAM;  Surgeon: [name], MD;  Location: [facility];  Service: Urology;  Laterality: Bilateral;   ESOPHAGOGASTRODUODENOSCOPY  12/30/2020   Procedure: ESOPHAGOGASTRODUODENOSCOPY (EGD);  Surgeon: [name], MD;  Location: [facility] ENDOSCOPY;  Service: Gastroenterology;;   ESOPHAGOGASTRODUODENOSCOPY N/A 09/17/2021   Procedure: ESOPHAGOGASTRODUODENOSCOPY (EGD);  Surgeon: [name], MD;  Location: [facility] ENDOSCOPY;  Service: Endoscopy;  Laterality: N/A;   GIVENS CAPSULE STUDY N/A 02/02/2021   Procedure: GIVENS CAPSULE STUDY;  Surgeon: [name], MD;  Location: [facility] ENDOSCOPY;  Service: Gastroenterology;  Laterality: N/A;  WILL NEED INTERPRETER ON WHEELS;  BROTHER WANTS TO TRANSLATE  TEE WITHOUT CARDIOVERSION N/A 07/01/2017   Procedure: TRANSESOPHAGEAL ECHOCARDIOGRAM (TEE);  Surgeon: [name], MD;  Location: [facility] ORS;  Service: Cardiovascular;  Laterality: N/A;   TEE WITHOUT CARDIOVERSION N/A 07/05/2017   Procedure: TRANSESOPHAGEAL ECHOCARDIOGRAM (TEE);  Surgeon: [name], MD;  Location: [facility] ORS;  Service: Cardiovascular;  Laterality: N/A;   TRANSURETHRAL RESECTION OF BLADDER TUMOR N/A  08/07/2021   Procedure: TRANSURETHRAL RESECTION OF BLADDER TUMOR (TURBT);  Surgeon: [name], MD;  Location: [facility] ORS;  Service: Urology;  Laterality: N/A;   URETEROSCOPY Left 08/07/2021   Procedure: DIAGNOSTIC URETEROSCOPY;  Surgeon: [name], MD;  Location: [facility] ORS;  Service: Urology;  Laterality: Left;     Current Outpatient Medications  Medication Sig Dispense Refill   amiodarone  (PACERONE ) 200 MG tablet Take 1 tablet (200 mg total) by mouth 2 (two) times daily. 60 tablet 11   atorvastatin  (LIPITOR ) 80 MG tablet TAKE 1 TABLET(80 MG) BY MOUTH DAILY 90 tablet 3   carvedilol  (COREG ) 6.25 MG tablet TAKE 1 TABLET(6.25 MG) BY MOUTH TWICE DAILY 60 tablet 11   folic acid  (FOLVITE ) 1 MG tablet Take 1 tablet (1 mg total) by mouth every morning. 90 tablet 1   gabapentin  (NEURONTIN ) 100 MG capsule Take 1 capsule (100 mg total) by mouth 3 (three) times daily. 270 capsule 0   levothyroxine  (SYNTHROID ) 88 MCG tablet Take 88 mcg by mouth daily before breakfast.     losartan  (COZAAR ) 25 MG tablet TAKE 1 TABLET(25 MG) BY MOUTH DAILY 90 tablet 3   meclizine  (ANTIVERT ) 25 MG tablet TAKE 1 TABLET(25 MG) BY MOUTH THREE TIMES DAILY AS NEEDED (Patient taking differently: Take 25 mg by mouth as needed.) 135 tablet 0   metFORMIN (GLUCOPHAGE-XR) 500 MG 24 hr tablet Take 500 mg by mouth daily.     pantoprazole  (PROTONIX ) 20 MG tablet TAKE 1 TABLET BY MOUTH EVERY DAY 90 tablet 1   carvedilol  (COREG ) 6.25 MG tablet TAKE 1 TABLET(6.25 MG) BY MOUTH TWICE DAILY 60 tablet 11   torsemide  (DEMADEX ) 10 MG tablet Take 1 tablet (10 mg total) by mouth 2 (two) times daily. 60 tablet 2   No current facility-administered medications for this visit.    Allergies:   Lorazepam  and Porcine (pork) protein-containing drug products    Social History:   reports that he has quit smoking. His smoking use included cigarettes. He has quit using smokeless tobacco.  His smokeless tobacco use included chew. He reports that  he does not drink alcohol and does not use drugs.   Family History:  family history includes Diabetes in his brother and sister; Hypertension in his mother.    ROS:     Review of Systems  Constitutional: Negative.   HENT: Negative.    Eyes: Negative.   Respiratory: Negative.    Gastrointestinal: Negative.   Genitourinary: Negative.   Musculoskeletal: Negative.   Skin: Negative.   Neurological: Negative.   Endo/Heme/Allergies: Negative.   Psychiatric/Behavioral: Negative.    All other systems reviewed and are negative.     All other systems are reviewed and negative.    PHYSICAL EXAM: VS:  BP 122/64   Pulse (!) 58   Ht 5' 5 (1.651 m)   Wt 150 lb 9.6 oz (68.3 kg)   SpO2 99%   BMI 25.06 kg/m  , BMI Body mass index is 25.06 kg/m. Last weight:  Wt Readings from Last 3 Encounters:  07/30/24 150 lb 9.6 oz (68.3 kg)  07/25/24 148 lb 3.2  oz (67.2 kg)  05/28/24 144 lb (65.3 kg)     Physical Exam Vitals reviewed.  Constitutional:      Appearance: Normal appearance. He is normal weight.  HENT:     Head: Normocephalic.     Nose: Nose normal.     Mouth/Throat:     Mouth: Mucous membranes are moist.  Eyes:     Pupils: Pupils are equal, round, and reactive to light.  Cardiovascular:     Rate and Rhythm: Normal rate and regular rhythm.     Pulses: Normal pulses.     Heart sounds: Normal heart sounds.  Pulmonary:     Effort: Pulmonary effort is normal.  Abdominal:     General: Abdomen is flat. Bowel sounds are normal.  Musculoskeletal:        General: Normal range of motion.     Cervical back: Normal range of motion.  Skin:    General: Skin is warm.  Neurological:     General: No focal deficit present.     Mental Status: He is alert.  Psychiatric:        Mood and Affect: Mood normal.       EKG:   Recent Labs: 02/21/2024: Hemoglobin 9.1; Platelets 169    Lipid Panel No results found for: CHOL, TRIG, HDL, CHOLHDL, VLDL, LDLCALC, LDLDIRECT     Other studies Reviewed: Additional studies/ records that were reviewed today include:  Review of the above records demonstrates:       No data to display            ASSESSMENT AND PLAN:    ICD-10-CM   1. SOB (shortness of breath)  R06.02 torsemide  (DEMADEX ) 10 MG tablet    amiodarone  (PACERONE ) 200 MG tablet   SOB getting worse.    2. PAF (paroxysmal atrial fibrillation) (HCC)  I48.0 torsemide  (DEMADEX ) 10 MG tablet    amiodarone  (PACERONE ) 200 MG tablet   Went back in afib and LVEF went down, and SOB, change amiodrone 200 bid    3. AICD (automatic cardioverter/defibrillator) present  Z95.810 torsemide  (DEMADEX ) 10 MG tablet    amiodarone  (PACERONE ) 200 MG tablet    4. Chronic systolic congestive heart failure (HCC)  I50.22 torsemide  (DEMADEX ) 10 MG tablet    amiodarone  (PACERONE ) 200 MG tablet   Torsemide  bid 10 mg    5. Ischemic cardiomyopathy  I25.5 torsemide  (DEMADEX ) 10 MG tablet    amiodarone  (PACERONE ) 200 MG tablet   Seeing EP, may consider treatning CHF, with synchronized pacing or duel PM.    6. Nonrheumatic aortic valve insufficiency  I35.1 torsemide  (DEMADEX ) 10 MG tablet    amiodarone  (PACERONE ) 200 MG tablet    7. Nonrheumatic mitral valve regurgitation  I34.0 torsemide  (DEMADEX ) 10 MG tablet    amiodarone  (PACERONE ) 200 MG tablet    8. Pain in both lower legs  M79.661 torsemide  (DEMADEX ) 10 MG tablet   M79.662 amiodarone  (PACERONE ) 200 MG tablet       Problem List Items Addressed This Visit       Cardiovascular and Mediastinum   PAF (paroxysmal atrial fibrillation) (HCC)   Relevant Medications   torsemide  (DEMADEX ) 10 MG tablet   amiodarone  (PACERONE ) 200 MG tablet   Ischemic cardiomyopathy   Relevant Medications   torsemide  (DEMADEX ) 10 MG tablet   amiodarone  (PACERONE ) 200 MG tablet   Chronic systolic congestive heart failure (HCC)   Relevant Medications   torsemide  (DEMADEX ) 10 MG tablet   amiodarone  (PACERONE ) 200 MG tablet  Other   AICD (automatic cardioverter/defibrillator) present   Relevant Medications   torsemide  (DEMADEX ) 10 MG tablet   amiodarone  (PACERONE ) 200 MG tablet   Other Visit Diagnoses       SOB (shortness of breath)    -  Primary   SOB getting worse.   Relevant Medications   torsemide  (DEMADEX ) 10 MG tablet   amiodarone  (PACERONE ) 200 MG tablet     Nonrheumatic aortic valve insufficiency       Relevant Medications   torsemide  (DEMADEX ) 10 MG tablet   amiodarone  (PACERONE ) 200 MG tablet     Nonrheumatic mitral valve regurgitation       Relevant Medications   torsemide  (DEMADEX ) 10 MG tablet   amiodarone  (PACERONE ) 200 MG tablet     Pain in both lower legs       Relevant Medications   torsemide  (DEMADEX ) 10 MG tablet   amiodarone  (PACERONE ) 200 MG tablet          Disposition:   No follow-ups on file.    Total time spent: 30 minutes  Signed,  Denyse Bathe, MD  07/30/2024 10:36 AM    [facility]

## 2024-08-14 NOTE — Progress Notes (Unsigned)
 Electrophysiology Office Note:   Date:  08/15/2024  ID:  Walter Blair [birth year], MRN [id number]  Primary Cardiologist: None Electrophysiologist: Blair Kitty, MD      History of Present Illness:   Walter Blair is a 78 y.o. male with h/o chronic systolic heart failure secondary to ischemic cardiomyopathy s/p single chamber ICD, atrial fibrillation who is being seen today for management of his ICD.  Discussed the use of AI scribe software for clinical note transcription with the patient, who gave verbal consent to proceed.  History of Present Illness Walter Blair is a 78 year old male with a defibrillator who presents for evaluation of increased pacing and worsening heart function. He was referred by his cardiologist, Dr. Fernand, for management of his defibrillator.  He has a defibrillator that was initially not used for pacing. Recently, it has been functioning as a pacemaker almost half the time. His ejection fraction has dropped from 55-45% to around 30% over the past year.  He experiences increased fatigue and shortness of breath, particularly when walking, which has led to the use of a wheelchair for mobility. His condition has worsened over the past year, with a significant decline in his ability to perform daily activities.  He is currently registered with hospice care due to the progressive decline in his heart function.  His current medication regimen and specific dosages were not discussed in the conversation.    Review of systems complete and found to be negative unless listed in HPI.   EP Information / Studies Reviewed:    EKG is ordered today. Personal review as below.  EKG Interpretation Date/Time:  Wednesday August 15 2024 11:11:30 EST Ventricular Rate:  62 PR Interval:    QRS Duration:  236 QT Interval:  566 QTC Calculation: 574 R Axis:   266  Text Interpretation: Ventricular-paced rhythm When compared with ECG of 25-Sep-2021 13:08, No significant  change was found Confirmed by Walter Blair [phone]) on 08/15/2024 12:11:43 PM   Echo 04/09/24: ASSESSMENT  Technically adequate study.  Left atrium & right atrium severely dilated.  Left ventricle & right ventricle severely dilated.  Four chamber enlargement with severe left ventricular systolic dysfunction  at 24%.  Mild left ventricular hypertrophy with GRADE 2 (psuedonormalization)  diastolic dysfunction.  Diffuse hypokinesis shown in left ventricular wall motion.  Trace pulmonary regurgitation.  Moderate tricuspid regurgitation.  Moderate pulmonary hypertension.  Moderate mitral regurgitation.  Trace aortic regurgitation.  No pericardial effusion.       Physical Exam:   VS:  BP 100/64   Pulse 62   Ht 5' 4 (1.626 m)   Wt 151 lb 6.4 oz (68.7 kg)   SpO2 100%   BMI 25.99 kg/m    Wt Readings from Last 3 Encounters:  08/15/24 151 lb 6.4 oz (68.7 kg)  07/30/24 150 lb 9.6 oz (68.3 kg)  07/25/24 148 lb 3.2 oz (67.2 kg)     GEN: Well nourished, well developed in no acute distress NECK: No JVD CARDIAC: Normal rate, regular rhythm. Well healed left chest ICD pocket.  RESPIRATORY:  Clear to auscultation without rales, wheezing or rhonchi  ABDOMEN: Soft, non-distended EXTREMITIES: 1+ edema; No deformity   ASSESSMENT AND PLAN:    #S/p single chamber ICD #Chronic systolic heart failure secondary to ischemic cardiomyopathy, CAD s/p CABG - Decline in LVEF and increasing RV pacing burden has been noted previously.  Not entirely a pacing induced cardiomyopathy, but could be contributing to worsening and symptom progression. Paced QRS is wide.  He has advanced heart failure without any therapeutic options otherwise.  Patient meets criteria for upgrade to CRT in the setting of LVEF less than 40% and greater than 40% pacing burden. Explained risks, benefits, and alternatives to CRT-D upgrade.  Pt and family verbalized understanding and elected to proceed. - In-clinic device interrogation  was performed today.  Appropriate device function and stable lead parameters.    #Atrial fibrillation: #High risk medication use: Amiodarone .  #Hypercoagulable state due to AF: -Unclear if patient is in permanent atrial fibrillation.Walter Blair  He is on amiodarone  which would suggest an effort to maintain sinus rhythm, but I cannot see an EKG that shows atrial activity.  Additionally, he does not have an atrial lead, so if we are trying to maintain sinus rhythm then unclear why atrial lead has not been added prior to now for AV synchrony.  I will reach out to his primary cardiologist to try and gain more insight into this.  I see no mention of ventricular tachycardia which would serve as alternate explanation for his amiodarone . -Continue amiodarone  for now. -He has not currently on anticoagulation for stroke prophylaxis.  Also unclear as to why.  I will also address this with his primary cardiologist as well.   Follow up with Dr. Kennyth for CRT-D upgrade.   Signed, Blair Kennyth, MD

## 2024-08-15 ENCOUNTER — Encounter: Payer: Self-pay | Admitting: Cardiology

## 2024-08-15 ENCOUNTER — Encounter: Payer: Self-pay | Admitting: *Deleted

## 2024-08-15 ENCOUNTER — Ambulatory Visit: Attending: Cardiology | Admitting: Cardiology

## 2024-08-15 VITALS — BP 100/64 | HR 62 | Ht 64.0 in | Wt 151.4 lb

## 2024-08-15 DIAGNOSIS — Z01812 Encounter for preprocedural laboratory examination: Secondary | ICD-10-CM

## 2024-08-15 DIAGNOSIS — I4891 Unspecified atrial fibrillation: Secondary | ICD-10-CM

## 2024-08-15 DIAGNOSIS — I5022 Chronic systolic (congestive) heart failure: Secondary | ICD-10-CM | POA: Diagnosis present

## 2024-08-15 DIAGNOSIS — I480 Paroxysmal atrial fibrillation: Secondary | ICD-10-CM

## 2024-08-15 DIAGNOSIS — D6869 Other thrombophilia: Secondary | ICD-10-CM

## 2024-08-15 DIAGNOSIS — I255 Ischemic cardiomyopathy: Secondary | ICD-10-CM | POA: Insufficient documentation

## 2024-08-15 DIAGNOSIS — I4819 Other persistent atrial fibrillation: Secondary | ICD-10-CM

## 2024-08-15 DIAGNOSIS — Z79899 Other long term (current) drug therapy: Secondary | ICD-10-CM

## 2024-08-15 DIAGNOSIS — I5043 Acute on chronic combined systolic (congestive) and diastolic (congestive) heart failure: Secondary | ICD-10-CM

## 2024-08-15 DIAGNOSIS — Z95810 Presence of automatic (implantable) cardiac defibrillator: Secondary | ICD-10-CM | POA: Diagnosis present

## 2024-08-15 LAB — CUP PACEART INCLINIC DEVICE CHECK
Date Time Interrogation Session: 20251126134152
HighPow Impedance: 36 Ohm
Implantable Lead Connection Status: 753985
Implantable Lead Implant Date: 20040108
Implantable Lead Location: 753860
Implantable Lead Model: 6947
Implantable Pulse Generator Implant Date: 20131128
Lead Channel Impedance Value: 399 Ohm
Lead Channel Pacing Threshold Amplitude: 0.75 V
Lead Channel Pacing Threshold Pulse Width: 0.4 ms
Lead Channel Sensing Intrinsic Amplitude: 13 mV

## 2024-08-15 NOTE — Patient Instructions (Signed)
 Medication Instructions:  The current medical regimen is effective;  continue present plan and medications.  *If you need a refill on your cardiac medications before your next appointment, please call your pharmacy*  Lab Work: Please have blood work today (CBC,BMP)  If you have labs (blood work) drawn today and your tests are completely normal, you will receive your results only by: MyChart Message (if you have MyChart) OR A paper copy in the mail If you have any lab test that is abnormal or we need to change your treatment, we will call you to review the results.  Testing/Procedures: Your physician has recommended that you have a defibrillator inserted. An implantable cardioverter defibrillator (ICD) is a small device that is placed in your chest or, in rare cases, your abdomen. This device uses electrical pulses or shocks to help control life-threatening, irregular heartbeats that could lead the heart to suddenly stop beating (sudden cardiac arrest). Leads are attached to the ICD that goes into your heart. This is done in the hospital and usually requires an overnight stay. Please see the instruction sheet given to you today for more information.  Follow-Up: At [facility], you and your health needs are our priority.  As part of our continuing mission to provide you with exceptional heart care, our providers are all part of one team.  This team includes your primary Cardiologist (physician) and Advanced Practice Providers or APPs (Physician Assistants and Nurse Practitioners) who all work together to provide you with the care you need, when you need it.  Your next appointment:   2 week(s) after your procedure  Provider:   Device clinic    We recommend signing up for the patient portal called MyChart.  Sign up information is provided on this After Visit Summary.  MyChart is used to connect with patients for Virtual Visits (Telemedicine).  Patients are able to view lab/test  results, encounter notes, upcoming appointments, etc.  Non-urgent messages can be sent to your provider as well.   To learn more about what you can do with MyChart, go to [email or web address].

## 2024-08-16 LAB — BASIC METABOLIC PANEL WITH GFR
BUN/Creatinine Ratio: 16 (ref 10–24)
BUN: 36 mg/dL — AB (ref 8–27)
CO2: 21 mmol/L (ref 20–29)
Calcium: 9.8 mg/dL (ref 8.6–10.2)
Chloride: 100 mmol/L (ref 96–106)
Creatinine, Ser: 2.3 mg/dL — AB (ref 0.76–1.27)
Glucose: 131 mg/dL — ABNORMAL HIGH (ref 70–99)
Potassium: 4.5 mmol/L (ref 3.5–5.2)
Sodium: 136 mmol/L (ref 134–144)
eGFR: 28 mL/min/1.73 — AB (ref >59)

## 2024-08-16 LAB — CBC
Hematocrit: 30.4 % — ABNORMAL LOW (ref 37.5–51.0)
Hemoglobin: 9.7 g/dL — ABNORMAL LOW (ref 13.0–17.7)
MCH: 28.6 pg (ref 26.6–33.0)
MCHC: 31.9 g/dL (ref 31.5–35.7)
MCV: 90 fL (ref 79–97)
Platelets: 140 x10E3/uL — ABNORMAL LOW (ref 150–450)
RBC: 3.39 x10E6/uL — ABNORMAL LOW (ref 4.14–5.80)
RDW: 16.3 % — ABNORMAL HIGH (ref 11.6–15.4)
WBC: 5.3 x10E3/uL (ref 3.4–10.8)

## 2024-08-20 ENCOUNTER — Telehealth (HOSPITAL_COMMUNITY): Payer: Self-pay

## 2024-08-20 ENCOUNTER — Ambulatory Visit: Payer: Self-pay | Admitting: Cardiology

## 2024-08-20 DIAGNOSIS — I5022 Chronic systolic (congestive) heart failure: Secondary | ICD-10-CM

## 2024-08-20 DIAGNOSIS — I255 Ischemic cardiomyopathy: Secondary | ICD-10-CM

## 2024-08-20 NOTE — Telephone Encounter (Signed)
 Pt for CRT-D upgrade on 12/5. Noted his increased Cr 2.30 (1.4 from Nov 2024 ). H/o CKD stage 3.   Per Dr Kennyth, I think options are limited unfortunately. A creatinine on day of procedure is helpful.   BMP order placed.

## 2024-08-23 NOTE — Addendum Note (Signed)
 Addended by: CHAUVIGNE, Takuya Lariccia on: 08/23/2024 09:52 AM   Modules accepted: Orders

## 2024-08-23 NOTE — Telephone Encounter (Signed)
 Per Dr. Kennyth patient's procedure should be cancelled for now until he is seen by heart failure clinic. Referral has been placed. Patient is aware.

## 2024-08-24 ENCOUNTER — Ambulatory Visit (HOSPITAL_COMMUNITY): Admission: RE | Admit: 2024-08-24 | Admitting: Cardiology

## 2024-08-24 ENCOUNTER — Encounter (HOSPITAL_COMMUNITY): Admission: RE | Payer: Self-pay | Source: Home / Self Care

## 2024-08-24 DIAGNOSIS — I5022 Chronic systolic (congestive) heart failure: Secondary | ICD-10-CM

## 2024-08-24 DIAGNOSIS — N1831 Chronic kidney disease, stage 3a: Secondary | ICD-10-CM

## 2024-08-24 DIAGNOSIS — I255 Ischemic cardiomyopathy: Secondary | ICD-10-CM

## 2024-08-24 SURGERY — BIV UPGRADE

## 2024-09-04 ENCOUNTER — Ambulatory Visit

## 2024-09-07 ENCOUNTER — Other Ambulatory Visit: Payer: Self-pay | Admitting: Cardiovascular Disease

## 2024-09-07 ENCOUNTER — Telehealth: Payer: Self-pay | Admitting: Family

## 2024-09-07 DIAGNOSIS — I480 Paroxysmal atrial fibrillation: Secondary | ICD-10-CM

## 2024-09-07 DIAGNOSIS — M79661 Pain in right lower leg: Secondary | ICD-10-CM

## 2024-09-07 DIAGNOSIS — I5022 Chronic systolic (congestive) heart failure: Secondary | ICD-10-CM

## 2024-09-07 DIAGNOSIS — R0602 Shortness of breath: Secondary | ICD-10-CM

## 2024-09-07 DIAGNOSIS — I340 Nonrheumatic mitral (valve) insufficiency: Secondary | ICD-10-CM

## 2024-09-07 DIAGNOSIS — I351 Nonrheumatic aortic (valve) insufficiency: Secondary | ICD-10-CM

## 2024-09-07 DIAGNOSIS — Z95810 Presence of automatic (implantable) cardiac defibrillator: Secondary | ICD-10-CM

## 2024-09-07 DIAGNOSIS — I255 Ischemic cardiomyopathy: Secondary | ICD-10-CM

## 2024-09-07 NOTE — Telephone Encounter (Signed)
 Called to confirm/remind patient of their appointment at the [facility] on 09/10/24.   Appointment:   [x] Confirmed  [] Left mess   [] No answer/No voice mail  [] VM Full/unable to leave message  [] Phone not in service  Patient reminded to bring all medications and/or complete list.  Confirmed patient has transportation. Gave directions, instructed to utilize valet parking.

## 2024-09-10 ENCOUNTER — Ambulatory Visit (HOSPITAL_COMMUNITY): Payer: Self-pay | Admitting: Cardiology

## 2024-09-10 ENCOUNTER — Ambulatory Visit
Admission: RE | Admit: 2024-09-10 | Discharge: 2024-09-10 | Disposition: A | Discharge status: Home / Self Care | Source: Ambulatory Visit | Attending: Cardiology | Admitting: Cardiology

## 2024-09-10 ENCOUNTER — Other Ambulatory Visit (HOSPITAL_COMMUNITY): Payer: Self-pay | Admitting: Cardiology

## 2024-09-10 ENCOUNTER — Ambulatory Visit (HOSPITAL_BASED_OUTPATIENT_CLINIC_OR_DEPARTMENT_OTHER): Admitting: Cardiology

## 2024-09-10 VITALS — BP 112/56 | HR 60 | Wt 163.2 lb

## 2024-09-10 DIAGNOSIS — I5043 Acute on chronic combined systolic (congestive) and diastolic (congestive) heart failure: Secondary | ICD-10-CM

## 2024-09-10 DIAGNOSIS — E1122 Type 2 diabetes mellitus with diabetic chronic kidney disease: Secondary | ICD-10-CM | POA: Insufficient documentation

## 2024-09-10 DIAGNOSIS — I5022 Chronic systolic (congestive) heart failure: Secondary | ICD-10-CM | POA: Insufficient documentation

## 2024-09-10 DIAGNOSIS — Z79890 Hormone replacement therapy (postmenopausal): Secondary | ICD-10-CM | POA: Insufficient documentation

## 2024-09-10 DIAGNOSIS — I5084 End stage heart failure: Secondary | ICD-10-CM | POA: Insufficient documentation

## 2024-09-10 DIAGNOSIS — I351 Nonrheumatic aortic (valve) insufficiency: Secondary | ICD-10-CM | POA: Diagnosis not present

## 2024-09-10 DIAGNOSIS — F17290 Nicotine dependence, other tobacco product, uncomplicated: Secondary | ICD-10-CM | POA: Diagnosis not present

## 2024-09-10 DIAGNOSIS — Z8249 Family history of ischemic heart disease and other diseases of the circulatory system: Secondary | ICD-10-CM | POA: Insufficient documentation

## 2024-09-10 DIAGNOSIS — Z79899 Other long term (current) drug therapy: Secondary | ICD-10-CM | POA: Insufficient documentation

## 2024-09-10 DIAGNOSIS — Z951 Presence of aortocoronary bypass graft: Secondary | ICD-10-CM | POA: Insufficient documentation

## 2024-09-10 DIAGNOSIS — I130 Hypertensive heart and chronic kidney disease with heart failure and stage 1 through stage 4 chronic kidney disease, or unspecified chronic kidney disease: Secondary | ICD-10-CM | POA: Insufficient documentation

## 2024-09-10 DIAGNOSIS — I255 Ischemic cardiomyopathy: Secondary | ICD-10-CM | POA: Diagnosis not present

## 2024-09-10 DIAGNOSIS — E785 Hyperlipidemia, unspecified: Secondary | ICD-10-CM | POA: Insufficient documentation

## 2024-09-10 DIAGNOSIS — I5082 Biventricular heart failure: Secondary | ICD-10-CM | POA: Insufficient documentation

## 2024-09-10 DIAGNOSIS — I340 Nonrheumatic mitral (valve) insufficiency: Secondary | ICD-10-CM | POA: Diagnosis not present

## 2024-09-10 DIAGNOSIS — Z833 Family history of diabetes mellitus: Secondary | ICD-10-CM | POA: Insufficient documentation

## 2024-09-10 DIAGNOSIS — I428 Other cardiomyopathies: Secondary | ICD-10-CM | POA: Diagnosis not present

## 2024-09-10 DIAGNOSIS — Z87891 Personal history of nicotine dependence: Secondary | ICD-10-CM | POA: Insufficient documentation

## 2024-09-10 DIAGNOSIS — Z7984 Long term (current) use of oral hypoglycemic drugs: Secondary | ICD-10-CM | POA: Insufficient documentation

## 2024-09-10 DIAGNOSIS — D631 Anemia in chronic kidney disease: Secondary | ICD-10-CM | POA: Insufficient documentation

## 2024-09-10 DIAGNOSIS — I2510 Atherosclerotic heart disease of native coronary artery without angina pectoris: Secondary | ICD-10-CM | POA: Insufficient documentation

## 2024-09-10 DIAGNOSIS — Z7189 Other specified counseling: Secondary | ICD-10-CM | POA: Diagnosis not present

## 2024-09-10 DIAGNOSIS — Z8673 Personal history of transient ischemic attack (TIA), and cerebral infarction without residual deficits: Secondary | ICD-10-CM | POA: Insufficient documentation

## 2024-09-10 DIAGNOSIS — N183 Chronic kidney disease, stage 3 unspecified: Secondary | ICD-10-CM | POA: Diagnosis not present

## 2024-09-10 DIAGNOSIS — Z7901 Long term (current) use of anticoagulants: Secondary | ICD-10-CM | POA: Insufficient documentation

## 2024-09-10 DIAGNOSIS — Z4502 Encounter for adjustment and management of automatic implantable cardiac defibrillator: Secondary | ICD-10-CM | POA: Insufficient documentation

## 2024-09-10 DIAGNOSIS — R0602 Shortness of breath: Secondary | ICD-10-CM

## 2024-09-10 DIAGNOSIS — I5023 Acute on chronic systolic (congestive) heart failure: Secondary | ICD-10-CM

## 2024-09-10 DIAGNOSIS — Z8551 Personal history of malignant neoplasm of bladder: Secondary | ICD-10-CM | POA: Diagnosis not present

## 2024-09-10 DIAGNOSIS — N189 Chronic kidney disease, unspecified: Secondary | ICD-10-CM | POA: Insufficient documentation

## 2024-09-10 DIAGNOSIS — I480 Paroxysmal atrial fibrillation: Secondary | ICD-10-CM | POA: Insufficient documentation

## 2024-09-10 DIAGNOSIS — Z95810 Presence of automatic (implantable) cardiac defibrillator: Secondary | ICD-10-CM | POA: Diagnosis not present

## 2024-09-10 LAB — CBC
HCT: 28.1 % — ABNORMAL LOW (ref 39.0–52.0)
Hemoglobin: 9.2 g/dL — ABNORMAL LOW (ref 13.0–17.0)
MCH: 28.8 pg (ref 26.0–34.0)
MCHC: 32.7 g/dL (ref 30.0–36.0)
MCV: 88.1 fL (ref 80.0–100.0)
Platelets: 150 K/uL (ref 150–400)
RBC: 3.19 MIL/uL — ABNORMAL LOW (ref 4.22–5.81)
RDW: 18.8 % — ABNORMAL HIGH (ref 11.5–15.5)
WBC: 4.7 K/uL (ref 4.0–10.5)
nRBC: 0 % (ref 0.0–0.2)

## 2024-09-10 LAB — COMPREHENSIVE METABOLIC PANEL WITH GFR
ALT: 89 U/L — ABNORMAL HIGH (ref 0–44)
AST: 78 U/L — ABNORMAL HIGH (ref 15–41)
Albumin: 3.9 g/dL (ref 3.5–5.0)
Alkaline Phosphatase: 77 U/L (ref 38–126)
Anion gap: 12 (ref 5–15)
BUN: 50 mg/dL — ABNORMAL HIGH (ref 8–23)
CO2: 21 mmol/L — ABNORMAL LOW (ref 22–32)
Calcium: 9.1 mg/dL (ref 8.9–10.3)
Chloride: 100 mmol/L (ref 98–111)
Creatinine, Ser: 2.68 mg/dL — ABNORMAL HIGH (ref 0.61–1.24)
GFR, Estimated: 23 mL/min — ABNORMAL LOW
Glucose, Bld: 133 mg/dL — ABNORMAL HIGH (ref 70–99)
Potassium: 4.8 mmol/L (ref 3.5–5.1)
Sodium: 133 mmol/L — ABNORMAL LOW (ref 135–145)
Total Bilirubin: 1 mg/dL (ref 0.0–1.2)
Total Protein: 6.5 g/dL (ref 6.5–8.1)

## 2024-09-10 LAB — LACTIC ACID, PLASMA: Lactic Acid, Venous: 0.8 mmol/L (ref 0.5–1.9)

## 2024-09-10 LAB — MAGNESIUM: Magnesium: 2 mg/dL (ref 1.7–2.4)

## 2024-09-10 MED ORDER — FUROSEMIDE 10 MG/ML IJ SOLN
INTRAMUSCULAR | Status: AC
  Filled 2024-09-10: qty 8

## 2024-09-10 MED ORDER — POTASSIUM CHLORIDE CRYS ER 20 MEQ PO TBCR: 40.0000 meq | EXTENDED_RELEASE_TABLET | Freq: Once | ORAL | Status: DC

## 2024-09-10 MED ORDER — FUROSEMIDE 10 MG/ML IJ SOLN
80.0000 mg | Freq: Once | INTRAMUSCULAR | Status: AC
  Administered 2024-09-10: 80 mg via INTRAVENOUS

## 2024-09-10 MED ORDER — TORSEMIDE 20 MG PO TABS: 20.0000 mg | ORAL_TABLET | Freq: Two times a day (BID) | ORAL | 1 refills | Status: DC

## 2024-09-10 MED ORDER — FUROSEMIDE 10 MG/ML IJ SOLN: 80.0000 mg | Freq: Once | INTRAMUSCULAR | Status: DC

## 2024-09-10 NOTE — Progress Notes (Signed)
 "  ADVANCED HF CLINIC CONSULT NOTE  Primary Care: Albina GORMAN Dine, MD Primary Cardiologist: Dr. 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  Echo 10/18 with EF 10-15%, mild AI, moderate PH  Admitted 11/22. Echo showed EF of 20-25% along with mild LVH, mildly elevated PA pressure and moderate/severe MR. He was diuresed, and HF consulted.   Seen by Dr. Fernand 07/30/24 for follow up and felt to be in Afib and referred to EP. Seen by Dr. Kennyth 08/15/24,  who on device interrogation noted that his single chamber ICD had recently started functioning as a pacemaker ~50% of the time. With this he has experienced a functional decline. He was schedule for CRT-D upgrade and hospice care stopped due to pursuing further therapies. CRT-D was cancelled after labs resulted with AKI. Dr. Kennyth referred to AHF.  Previously seen in [facility], last seen 11/22.   He returns today for HF follow up. Overall feeling poorly, ROS mostly from brother. NYHA IIIb, has been feeling short of breath with edema. Denies chest pain, near-syncope, palpitations, and dizziness. Appetite has been poor. Compliant with all medications.  Past Medical History:  Diagnosis Date   AICD (automatic cardioverter/defibrillator) present    Aortic atherosclerosis    Atrial fibrillation (HCC)    a.) CHA2DS2-VASc = 7 (age x 2, CHF, CVA x 2, aortic plaque, T2DM). b.) rate/rhythm maintained on oral amiodarone ; chronically anticoagulated on rivaroxaban . c.) s/p DCCV 07/01/2017 and 07/05/2017   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   a.) cystoscopy 08/05/2021 --> ~2-3 cm papillary tumor at the LEFT bladder base   CAD (coronary artery disease)    CHF (congestive heart failure) (HCC)    CKD (chronic kidney disease), stage III  (HCC)    Dyspnea    GERD (gastroesophageal reflux disease)    HFrEF (heart failure with reduced ejection fraction) (HCC)    a.) TTE 06/29/2017: EF 10-15%; diffuse HK; mild AR, severe MR/TR, mod PR; BAE. b.) TTE 07/25/2021: EF 20-25%; global HK; G3DD; severely reduced RV function; Severe BAR; mod-severe MR, severe TR, mild AR.   HLD (hyperlipidemia)    HTN (hypertension)    Hypothyroidism    IDA (iron deficiency anemia)    Ischemic cardiomyopathy    a.) TTE 06/29/2017: EF 10-15%. b.) TTE 07/25/2021: ED 20-25%.   Left-sided cerebrovascular accident (CVA) (HCC) 2004   a.) residual weakness in LEFT hand   Long term current use of anticoagulant    a.) rivaroxaban    S/P CABG x 4 2003   [place]   T2DM (type 2 diabetes mellitus) (HCC)    Thrombocytopenia    possibly associated with underlying DIC    Current Outpatient Medications  Medication Sig Dispense Refill   acetaminophen  (TYLENOL ) 500 MG tablet Take 500-1,000 mg by mouth every 6 (six) hours as needed for mild pain (pain score 1-3) or moderate pain (pain score 4-6).     amiodarone  (PACERONE ) 200 MG tablet Take 1 tablet (200 mg total) by mouth 2 (two) times daily. 60 tablet 11   atorvastatin  (LIPITOR ) 80 MG tablet TAKE 1 TABLET(80 MG) BY MOUTH DAILY 90 tablet 3   Calcium  Carbonate Antacid (TUMS PO) Take 1 tablet by mouth daily as needed (heartburn).     carvedilol  (COREG ) 6.25 MG tablet TAKE 1 TABLET(6.25  MG) BY MOUTH TWICE DAILY 60 tablet 11   folic acid  (FOLVITE ) 1 MG tablet Take 1 tablet (1 mg total) by mouth every morning. 90 tablet 1   gabapentin  (NEURONTIN ) 100 MG capsule Take 1 capsule (100 mg total) by mouth 3 (three) times daily. 270 capsule 0   levothyroxine  (SYNTHROID ) 88 MCG tablet Take 88 mcg by mouth daily before breakfast.     losartan  (COZAAR ) 25 MG tablet TAKE 1 TABLET(25 MG) BY MOUTH DAILY 90 tablet 3   meclizine  (ANTIVERT ) 25 MG tablet TAKE 1 TABLET(25 MG) BY MOUTH THREE TIMES DAILY AS NEEDED 135  tablet 0   pantoprazole  (PROTONIX ) 20 MG tablet TAKE 1 TABLET BY MOUTH EVERY DAY 90 tablet 1   Polyethyl Glycol-Propyl Glycol (SYSTANE OP) Place 1 drop into both eyes daily.     torsemide  (DEMADEX ) 10 MG tablet TAKE 1 TABLET(10 MG) BY MOUTH TWICE DAILY 180 tablet 2   carvedilol  (COREG ) 6.25 MG tablet TAKE 1 TABLET(6.25 MG) BY MOUTH TWICE DAILY 60 tablet 11   metFORMIN (GLUCOPHAGE-XR) 500 MG 24 hr tablet Take 500 mg by mouth daily.     No current facility-administered medications for this visit.    Allergies[1]    Social History   Socioeconomic History   Marital status: Widowed    Spouse name: Not on file   Number of children: Not on file   Years of education: Not on file   Highest education level: Not on file  Occupational History   Not on file  Tobacco Use   Smoking status: Former    Types: Cigarettes   Smokeless tobacco: Former    Types: Chew  Substance and Sexual Activity   Alcohol use: No   Drug use: No   Sexual activity: Not Currently  Other Topics Concern   Not on file  Social History Narrative   Lives with brother, POA   Social Drivers of Health   Tobacco Use: High Risk (08/28/2024)   Received [facility]   Patient History    Smoking Tobacco Use: Former    Smokeless Tobacco Use: Current    Passive Exposure: Not on Actuary Strain: Not on file  Food Insecurity: Not on file  Transportation Needs: Not on file  Physical Activity: Not on file  Stress: Not on file  Social Connections: Not on file  Intimate Partner Violence: Not on file  Depression (PHQ2-9): Low Risk (08/19/2023)   Depression (PHQ2-9)    PHQ-2 Score: 0  Alcohol Screen: Not on file  Housing: Unknown (08/28/2024)   Received from [facility]   Epic    Unable to Pay for Housing in the Last Year: Not on file    Number of Times Moved in the Last Year: Not on file    At any time in the past 12 months, were you homeless or living in a shelter  (including now)?: No  Utilities: Not on file  Health Literacy: Not on file   Family History  Problem Relation Age of Onset   Diabetes Brother    Hypertension Mother    Diabetes Sister    Vitals:   09/10/24 1507  Weight: 163 lb 3.2 oz (74 kg)    Filed Weights   09/10/24 1507  Weight: 163 lb 3.2 oz (74 kg)    PHYSICAL EXAM: General: Frail appearing. No distress  Cardiac: JVP to jaw. No murmurs  Resp: Expiratory wheezing Extremities: Cool and dry.  3+ edema.  Neuro:  A&O x3. Affect pleasant.   ASSESSMENT & PLAN:  End-Stage Biventricular Heart Failure, ICM - Longstanding severely reduced LV dysfunction EF ~20% (most recently), images on TTE throughout the years have been poor. Severe RV dysfunction noted in 2018 with consistent remodeling on most recent echo. Now with increased RV pacing burden and decline in NYHA class, paced QRS is wide, could be contributing to worsening symptoms. He was previously on home hospice, but services were discontinued with plans for ICD>CRT-D upgrade, however cancelled due to AKI. Concerning for possible low output. - NYHA IV. Volume up on exam - Weight up 10 lbs since last month. - GDMT limited by CKD and BP - sending for IV lasix  80 mg at infusion center + PO KCL 40 mEq - increase torsemide  to 20 mg bid - continue losartan  25 mg daily - stop coreg  - wear TED hose - check lactic acid, CMET, Mg, CBC - discussed if lactic acid is elevated, recommend admission, he and family are aware and agreeable - will plan to diurese and attempt to optimize from HF standpoint - not an advanced therapies candidate due to age, CKD, and RV failure. See GOC below  2. T2DM - per PCP  3. Atrial fibrillation - has single chamber AICD; now with increased RV pacing with wide QRS - planned for CRTD upgrade, cancelled due to AKI - per Dr. Kennyth: permanent AF is unclear, see note 08/15/24 - on amio 200 mg bid - previously on xarelto , stopped 12/22 d/t GIB; defer  restarting to EP  4. MR/TR - in the setting of volume overload  5. CAD - s/p CABG x4 ([place] in 2003) - continue atorva 80 mg daily  5. GOC - previously on hospice; stopped due to pursuing procedural services - Long discussion with brother (in room) and niece-in-law (over phone) that he is likely end-stage and nearing end of life. If he is low output, I doubt that we would be able to optimize him enough for CRT-D procedure or that this procedure would significantly change his quality or length of life. Discussed palliative home inotrope could be an option if insurance allows and it aligns with their goals. I explained that this medicine would not prolong his life and very well could shorten it, but it may make him feel better for the time that he has left. Their goals appear to be quality over quantity. Discussed that milrinone would need to be started in the hospital, they are aware and will go to the hospital if symptoms worsen or if called by our team pending lab results.   Follow up in 1 week. Will notify brother once labs are resulted. If lactic acid is elevated or worsening Cr, recommend going to [facility]. Discussed plan with Dr. Bensimhon.   I personally spent a total of 48 minutes in the care of the patient today including performing a medically appropriate exam/evaluation, counseling and educating, coordinating care, and performing goals of care discussion.  Tadeo Besecker, NP 09/10/2024     [1]  Allergies Allergen Reactions   Lorazepam  Shortness Of Breath and Other (See Comments)    Pt experienced adverse reaction and was transferred to ICU last time they were given Med Other reaction(s): Other (See Comments) Pt experienced adverse reaction and was transferred to ICU last time they were given Med   Porcine (Pork) Protein-Containing Drug Products Other (See Comments)    Cultural reasons   "

## 2024-09-10 NOTE — Patient Instructions (Addendum)
 Medication Changes:  STOP Carvedilol   INCREASE Torsemide  to 20mg  (1 tab) two times daily   Special Instructions // Education:  Please report to Same Day Surgery to receive IV lasix  and labs.   Follow-Up in: Please follow up with the [facility] in [place] next week.   The code to get into the underground parking lot is: 1530   Thank you for choosing [facility].    At the [facility], you and your health needs are our priority. We have a designated team specialized in the treatment of Heart Failure. This Care Team includes your primary Heart Failure Specialized Cardiologist (physician), Advanced Practice Providers (APPs- Physician Assistants and Nurse Practitioners), and Pharmacist who all work together to provide you with the care you need, when you need it.   You may see any of the following providers on your designated Care Team at your next follow up:  Dr. Toribio Fuel Dr. Ezra Shuck Dr. Ria Commander Dr. Morene Brownie Ellouise Class, FNP Jaun Bash, RPH-CPP  Please be sure to bring in all your medications bottles to every appointment.   Need to Contact Us :  If you have any questions or concerns before your next appointment please send us  a message through [place] or call our office at [phone].    TO LEAVE A MESSAGE FOR THE NURSE SELECT OPTION 2, PLEASE LEAVE A MESSAGE INCLUDING: YOUR NAME DATE OF BIRTH CALL BACK NUMBER REASON FOR CALL**this is important as we prioritize the call backs  YOU WILL RECEIVE A CALL BACK THE SAME DAY AS LONG AS YOU CALL BEFORE 4:00 PM

## 2024-09-14 ENCOUNTER — Telehealth: Payer: Self-pay

## 2024-09-14 NOTE — Telephone Encounter (Signed)
 Per Garrison Greig Mosses, NP, gave an order to have [employer] rep come on the 29th to AHF Clinic. [place], [employer] rep, notified and will plan to set up home monitor and send a transmission at that time.

## 2024-09-14 NOTE — Telephone Encounter (Signed)
 Pt is being seen at the hospital and industry will come and check pts device since pt is EOL

## 2024-09-14 NOTE — Telephone Encounter (Signed)
 Spoke with Daphne in AHF clinic. She will confirm with provider that it is okay for [employer] rep to come to appointment on 09/17/24 to set up home monitor.

## 2024-09-17 ENCOUNTER — Ambulatory Visit (HOSPITAL_COMMUNITY)
Admission: RE | Admit: 2024-09-17 | Discharge: 2024-09-17 | Disposition: A | Discharge status: Home / Self Care | Source: Ambulatory Visit | Attending: Physician Assistant | Admitting: Physician Assistant

## 2024-09-17 ENCOUNTER — Ambulatory Visit (HOSPITAL_COMMUNITY)
Admission: RE | Admit: 2024-09-17 | Discharge: 2024-09-17 | Disposition: A | Source: Ambulatory Visit | Attending: Physician Assistant | Admitting: Physician Assistant

## 2024-09-17 ENCOUNTER — Ambulatory Visit (HOSPITAL_COMMUNITY): Payer: Self-pay | Admitting: Physician Assistant

## 2024-09-17 ENCOUNTER — Encounter (HOSPITAL_COMMUNITY): Payer: Self-pay

## 2024-09-17 VITALS — BP 110/60 | HR 60 | Wt 153.7 lb

## 2024-09-17 DIAGNOSIS — I132 Hypertensive heart and chronic kidney disease with heart failure and with stage 5 chronic kidney disease, or end stage renal disease: Secondary | ICD-10-CM | POA: Insufficient documentation

## 2024-09-17 DIAGNOSIS — E8770 Fluid overload, unspecified: Secondary | ICD-10-CM | POA: Insufficient documentation

## 2024-09-17 DIAGNOSIS — D631 Anemia in chronic kidney disease: Secondary | ICD-10-CM | POA: Insufficient documentation

## 2024-09-17 DIAGNOSIS — I5082 Biventricular heart failure: Secondary | ICD-10-CM | POA: Diagnosis not present

## 2024-09-17 DIAGNOSIS — I5022 Chronic systolic (congestive) heart failure: Secondary | ICD-10-CM | POA: Insufficient documentation

## 2024-09-17 DIAGNOSIS — N184 Chronic kidney disease, stage 4 (severe): Secondary | ICD-10-CM | POA: Insufficient documentation

## 2024-09-17 DIAGNOSIS — E1122 Type 2 diabetes mellitus with diabetic chronic kidney disease: Secondary | ICD-10-CM | POA: Diagnosis not present

## 2024-09-17 DIAGNOSIS — E785 Hyperlipidemia, unspecified: Secondary | ICD-10-CM | POA: Insufficient documentation

## 2024-09-17 DIAGNOSIS — I5084 End stage heart failure: Secondary | ICD-10-CM | POA: Diagnosis not present

## 2024-09-17 DIAGNOSIS — Z79899 Other long term (current) drug therapy: Secondary | ICD-10-CM | POA: Diagnosis not present

## 2024-09-17 DIAGNOSIS — Z8673 Personal history of transient ischemic attack (TIA), and cerebral infarction without residual deficits: Secondary | ICD-10-CM | POA: Insufficient documentation

## 2024-09-17 DIAGNOSIS — Z951 Presence of aortocoronary bypass graft: Secondary | ICD-10-CM | POA: Diagnosis not present

## 2024-09-17 DIAGNOSIS — K219 Gastro-esophageal reflux disease without esophagitis: Secondary | ICD-10-CM | POA: Diagnosis not present

## 2024-09-17 DIAGNOSIS — Z87891 Personal history of nicotine dependence: Secondary | ICD-10-CM | POA: Insufficient documentation

## 2024-09-17 DIAGNOSIS — I255 Ischemic cardiomyopathy: Secondary | ICD-10-CM | POA: Diagnosis not present

## 2024-09-17 DIAGNOSIS — I482 Chronic atrial fibrillation, unspecified: Secondary | ICD-10-CM

## 2024-09-17 DIAGNOSIS — Z7984 Long term (current) use of oral hypoglycemic drugs: Secondary | ICD-10-CM | POA: Insufficient documentation

## 2024-09-17 DIAGNOSIS — I2510 Atherosclerotic heart disease of native coronary artery without angina pectoris: Secondary | ICD-10-CM | POA: Insufficient documentation

## 2024-09-17 DIAGNOSIS — I480 Paroxysmal atrial fibrillation: Secondary | ICD-10-CM | POA: Diagnosis not present

## 2024-09-17 LAB — COMPREHENSIVE METABOLIC PANEL WITH GFR
ALT: 68 U/L — ABNORMAL HIGH (ref 0–44)
AST: 67 U/L — ABNORMAL HIGH (ref 15–41)
Albumin: 3.8 g/dL (ref 3.5–5.0)
Alkaline Phosphatase: 78 U/L (ref 38–126)
Anion gap: 10 (ref 5–15)
BUN: 21 mg/dL (ref 8–23)
CO2: 27 mmol/L (ref 22–32)
Calcium: 8.8 mg/dL — ABNORMAL LOW (ref 8.9–10.3)
Chloride: 97 mmol/L — ABNORMAL LOW (ref 98–111)
Creatinine, Ser: 1.85 mg/dL — ABNORMAL HIGH (ref 0.61–1.24)
GFR, Estimated: 37 mL/min — ABNORMAL LOW
Glucose, Bld: 116 mg/dL — ABNORMAL HIGH (ref 70–99)
Potassium: 3.7 mmol/L (ref 3.5–5.1)
Sodium: 133 mmol/L — ABNORMAL LOW (ref 135–145)
Total Bilirubin: 1.5 mg/dL — ABNORMAL HIGH (ref 0.0–1.2)
Total Protein: 6.5 g/dL (ref 6.5–8.1)

## 2024-09-17 LAB — PRO BRAIN NATRIURETIC PEPTIDE: Pro Brain Natriuretic Peptide: 6153 pg/mL — ABNORMAL HIGH

## 2024-09-17 MED ORDER — POTASSIUM CHLORIDE CRYS ER 20 MEQ PO TBCR: 40.0000 meq | EXTENDED_RELEASE_TABLET | Freq: Every day | ORAL | 3 refills | Status: DC

## 2024-09-17 MED ORDER — FUROSEMIDE 10 MG/ML IJ SOLN
80.0000 mg | Freq: Once | INTRAMUSCULAR | Status: AC
  Administered 2024-09-17: 80 mg via INTRAVENOUS

## 2024-09-17 MED ORDER — FUROSEMIDE 10 MG/ML IJ SOLN
INTRAMUSCULAR | Status: AC
  Filled 2024-09-17: qty 8

## 2024-09-17 MED ORDER — TORSEMIDE 20 MG PO TABS: 40.0000 mg | ORAL_TABLET | Freq: Two times a day (BID) | ORAL | 3 refills | Status: DC

## 2024-09-17 NOTE — Progress Notes (Addendum)
 "  ADVANCED HF CLINIC NOTE  Primary Care: Albina GORMAN Dine, MD Primary Cardiologist: Dr. 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  Echo 10/18 with EF 10-15%, mild AI, moderate PH  Admitted 11/22. Echo showed EF of 20-25% along with mild LVH, mildly elevated PA pressure and moderate/severe MR. He was diuresed, and HF consulted.   Last echo 7/25: EF 24%, severe LV and RV enlargement, severe BAE, moderate TR, moderate MR  Seen by Dr. Fernand 07/30/24 for follow up and felt to be in Afib and referred to EP. Seen by Dr. Kennyth 08/15/24,  who on device interrogation noted that his single chamber ICD had recently started functioning as a pacemaker ~50% of the time. With this he has experienced a functional decline. He was schedule for CRT-D upgrade and hospice care stopped due to pursuing further therapies. CRT-D was cancelled after labs resulted with AKI. Dr. Kennyth referred to AHF.  Previously seen in [facility], last seen 11/22. Reestablished 09/10/24. Feeling poorly with NYHA IIIb symptoms. He was volume up on exam with noted 10 lb weight gain. There was concern for low-output HF and he appeared to be end-stage. Goals of care discussed extensively. He was given 80 mg lasix  IV at infusion center and home torsemide  was increased. Coreg  stopped.  Lactic acid okay.   He is here today for 1 week follow-up. Brother provides most of history, niece and nephew are on the phone for part of the visit. Weight is down 10 lb from last visit. However, continues to get short of breath with minimal exertion and has lower extremity edema to thighs. No orthopnea or PND but coughs a lot lying down. Appetite poor.  Past Medical History:  Diagnosis Date   AICD (automatic  cardioverter/defibrillator) present    Aortic atherosclerosis    Atrial fibrillation (HCC)    a.) CHA2DS2-VASc = 7 (age x 2, CHF, CVA x 2, aortic plaque, T2DM). b.) rate/rhythm maintained on oral amiodarone ; chronically anticoagulated on rivaroxaban . 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 b.) TTE 07/25/2021: EF 20-25%; global HK; G3DD; severely reduced RV function; Severe BAR; mod-severe MR, severe TR, mild AR.   HLD (hyperlipidemia)    HTN (hypertension)    Hypothyroidism    IDA (iron deficiency anemia)    Ischemic cardiomyopathy    a.) TTE 06/29/2017: EF 10-15%. b.) TTE 07/25/2021: ED 20-25%.   Left-sided cerebrovascular accident (CVA) (HCC) 2004   a.) residual weakness in LEFT hand   Long term current use of anticoagulant    a.) rivaroxaban    S/P CABG x 4 2003   [place]   T2DM (type 2 diabetes mellitus) (HCC)    Thrombocytopenia    possibly associated with underlying DIC    Current Outpatient Medications  Medication Sig Dispense Refill  acetaminophen  (TYLENOL ) 500 MG tablet Take 500-1,000 mg by mouth every 6 (six) hours as needed for mild pain (pain score 1-3) or moderate pain (pain score 4-6).     amiodarone  (PACERONE ) 200 MG tablet Take 1 tablet (200 mg total) by mouth 2 (two) times daily. 60 tablet 11   atorvastatin  (LIPITOR ) 80 MG tablet TAKE 1 TABLET(80 MG) BY MOUTH DAILY 90 tablet 3   Calcium  Carbonate Antacid (TUMS PO) Take 1 tablet by mouth daily as needed (heartburn).     folic acid  (FOLVITE ) 1 MG tablet Take 1 tablet (1 mg total)  by mouth every morning. 90 tablet 1   gabapentin  (NEURONTIN ) 100 MG capsule Take 1 capsule (100 mg total) by mouth 3 (three) times daily. 270 capsule 0   levothyroxine  (SYNTHROID ) 88 MCG tablet Take 88 mcg by mouth daily before breakfast.     losartan  (COZAAR ) 25 MG tablet TAKE 1 TABLET(25 MG) BY MOUTH DAILY 90 tablet 3   meclizine  (ANTIVERT ) 25 MG tablet TAKE 1 TABLET(25 MG) BY MOUTH THREE TIMES DAILY AS NEEDED 135 tablet 0   pantoprazole  (PROTONIX ) 20 MG tablet TAKE 1 TABLET BY MOUTH EVERY DAY 90 tablet 1   Polyethyl Glycol-Propyl Glycol (SYSTANE OP) Place 1 drop into both eyes daily.     torsemide  (DEMADEX ) 20 MG tablet Take 1 tablet (20 mg total) by mouth 2 (two) times daily. 180 tablet 1   TRADJENTA 5 MG TABS tablet Take 5 mg by mouth daily.     metFORMIN (GLUCOPHAGE-XR) 500 MG 24 hr tablet Take 500 mg by mouth daily. (Patient not taking: Reported on 09/17/2024)     No current facility-administered medications for this encounter.    Allergies[1]    Social History   Socioeconomic History   Marital status: Widowed    Spouse name: Not on file   Number of children: Not on file   Years of education: Not on file   Highest education level: Not on file  Occupational History   Not on file  Tobacco Use   Smoking status: Former    Types: Cigarettes   Smokeless tobacco: Former    Types: Chew  Substance and Sexual Activity   Alcohol use: No   Drug use: No   Sexual activity: Not Currently  Other Topics Concern   Not on file  Social History Narrative   Lives with brother, POA   Social Drivers of Health   Tobacco Use: Medium Risk (09/17/2024)   Patient History    Smoking Tobacco Use: Former    Smokeless Tobacco Use: Former    Passive Exposure: Not on Stage Manager: Not on Ship Broker Insecurity: Not on file  Transportation Needs: Not on file  Physical Activity: Not on file  Stress: Not on file  Social Connections: Not on file  Intimate Partner Violence: Not  on file  Depression (PHQ2-9): Low Risk (08/19/2023)   Depression (PHQ2-9)    PHQ-2 Score: 0  Alcohol Screen: Not on file  Housing: Unknown (08/28/2024)   Received from [facility]   Epic    Unable to Pay for Housing in the Last Year: Not on file    Number of Times Moved in the Last Year: Not on file    At any time in the past 12 months, were you homeless or living in a shelter (including now)?: No  Utilities: Not on file  Health Literacy: Not on file   Family History  Problem Relation Age of  Onset   Diabetes Brother    Hypertension Mother    Diabetes Sister    Vitals:   09/17/24 1131  BP: 110/60  Pulse: 60  SpO2: 96%  Weight: 69.7 kg (153 lb 11.2 oz)     Filed Weights   09/17/24 1131  Weight: 69.7 kg (153 lb 11.2 oz)     PHYSICAL EXAM: General: Ill appearing elderly male Cor: JVP to jaw. No murmur. Lungs: diminished Extremities: cool, 3+ edema Neuro: alert & oriented x 3. Affect flat.   ASSESSMENT & PLAN:  End-Stage Biventricular Heart Failure - ICM, high burden of RV pacing likely also contributing - Longstanding severely reduced LV dysfunction EF ~20% (most recently), images on TTE throughout the years have been poor. Severe RV dysfunction noted in 2018 with consistent remodeling on most recent echo. Now with increased RV pacing burden and decline in NYHA class, paced QRS is wide, could be contributing to worsening symptoms. He was previously on home hospice, but services were discontinued with plans for ICD>CRT-D upgrade, however cancelled due to AKI. Concerning for possible low output. - NYHA IV. Markedly volume overloaded. Weight down 10 lb from last visit. Not sure if this is due to diuresis or decreased po intake.  - Arrange for 80 mg IV lasix  at infusion center.  - Increase torsemide  to 40 BID. Will decide on K supp pending labs (K has been upper normal on diuretics) - may need to stop losartan  pending labs - off coreg  with concern for low  output - GDMT limited by kidney function - wear TED hose - will plan to diurese and attempt to optimize from HF standpoint - not an advanced therapies candidate due to age, CKD, and RV failure. See GOC below  2. T2DM - per PCP  3. Atrial fibrillation - has single chamber AICD; now with increased RV pacing with wide QRS - planned for CRTD upgrade, cancelled due to AKI - per Dr. Kennyth: unclear if permanent. Does not have atrial lead to discriminate - on amio 200 mg bid - previously on xarelto , stopped 12/22 d/t GIB; defer restarting to EP  4. MR/TR - in the setting of volume overload  5. CAD - s/p CABG x4 ([place] in [place]) - continue atorva 80 mg daily  6. 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 GOC - previously on hospice; stopped due to pursuing procedural services - continued goals of care discussions with brother (in room), niece-in-law and nephew (over phone). They understand that he has end-stage heart failure. Suspect low-output and low likelihood we can optimize him enough for CRT-D upgrade. Also doubt that this would improve his quality of life or life expectancy at this point. Options include admission for IV diuresis w/ possible palliative inotrope support vs escalating outpatient diuretic regimen to improve symptoms. Family would prefer to keep him at home as much as possible. He has struggled with delirium during prior hospitalizations. If he feels to respond to escalating diuretics or today's labs appear worse, they are willing to consider admission. Their main goal is to focus on quality of life.   Follow up 1 weeks with APP  I spent a total of 50 minutes today: 1) reviewing the patient's medical records including previous charts, labs and recent notes from other providers; 2) examining the patient and counseling them on their medical issues/explaining the plan of care; 3) adjusting  meds as  needed 4) ordering lab work or other needed tests and 5) discussing goals of care with patient and family  Karleigh Bunte, MANUELITA SAILOR, PA-C 09/17/24     [1]  Allergies Allergen Reactions   Lorazepam  Shortness Of Breath and Other (See Comments)    Pt experienced adverse reaction and was transferred to ICU last time they were given Med Other reaction(s): Other (See Comments) Pt experienced adverse reaction and was transferred to ICU last time they were given Med   Porcine (Pork) Protein-Containing Drug Products Other (See Comments)    Cultural reasons   "

## 2024-09-17 NOTE — Patient Instructions (Addendum)
 Medication Changes:  IV LASIX  TODAY 80mg  x1 dose  INCREASE TORSEMIDE  TO 40MG  TWICE DAILY   Lab Work:  Labs done today, your results will be available in MyChart, we will contact you for abnormal readings.  Follow-Up in: NEXT WEEK AS SCHEDULED   At the [facility], you and your health needs are our priority. We have a designated team specialized in the treatment of Heart Failure. This Care Team includes your primary Heart Failure Specialized Cardiologist (physician), Advanced Practice Providers (APPs- Physician Assistants and Nurse Practitioners), and Pharmacist who all work together to provide you with the care you need, when you need it.   You may see any of the following providers on your designated Care Team at your next follow up:  Dr. Toribio Fuel Dr. Ezra Shuck Dr. Odis Brownie Greig Mosses, NP Caffie Shed, [place] [facility] [place], [place] Beckey Coe, NP Jordan Lee, NP Tinnie Redman, PharmD   Please be sure to bring in all your medications bottles to every appointment.   Need to Contact Us :  If you have any questions or concerns before your next appointment please send us  a message through [place] or call our office at [phone].    TO LEAVE A MESSAGE FOR THE NURSE SELECT OPTION 2, PLEASE LEAVE A MESSAGE INCLUDING: YOUR NAME DATE OF BIRTH CALL BACK NUMBER REASON FOR CALL**this is important as we prioritize the call backs  YOU WILL RECEIVE A CALL BACK THE SAME DAY AS LONG AS YOU CALL BEFORE 4:00 PM

## 2024-09-17 NOTE — Telephone Encounter (Signed)
 Spoke with pt, confirmed address and have sent out monitor. Pt aware it will take 7-10 business days

## 2024-09-17 NOTE — Telephone Encounter (Signed)
 Reviewed today's labs with patients brother via phone. Starting potassium chloride  40 mEq daily. Sending to pharmacy.

## 2024-09-17 NOTE — Telephone Encounter (Signed)
 Called pt left vm, pt was given the wrong monitor today in the office. Will need to call [employer] and send pt the correct monitor and have pt bring the relay back to the office(if he is able)

## 2024-09-19 NOTE — Progress Notes (Signed)
 "  ADVANCED HF CLINIC NOTE  Primary Care: Albina GORMAN Dine, MD Primary Cardiologist: Dr. 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  Echo 10/18 with EF 10-15%, mild AI, moderate PH  Admitted 11/22. Echo showed EF of 20-25% along with mild LVH, mildly elevated PA pressure and moderate/severe MR. He was diuresed, and HF consulted.   Last echo 7/25: EF 24%, severe LV and RV enlargement, severe BAE, moderate TR, moderate MR  Seen by Dr. Fernand 07/30/24 for follow up and felt to be in Afib and referred to EP. Seen by Dr. Kennyth 08/15/24,  who on device interrogation noted that his single chamber ICD had recently started functioning as a pacemaker ~50% of the time. With this he has experienced a functional decline. He was schedule for CRT-D upgrade and hospice care stopped due to pursuing further therapies. CRT-D was cancelled after labs resulted with AKI. Dr. Kennyth referred to AHF.  Previously seen in [facility], last seen 11/22. Reestablished 09/10/24. Feeling poorly with NYHA IIIb symptoms. He was volume up on exam with noted 10 lb weight gain. There was concern for low-output HF and he appeared to be end-stage. Goals of care discussed extensively. He was given 80 mg lasix  IV at infusion center and home torsemide  was increased. Coreg  stopped.  Lactic acid okay.   Today he returns for AHF follow up with his brother. Overall feeling ok. Denies palpitations, CP, dizziness, or PND/Orthopnea. SOB with activity. BLE edema. Appetite ok, eats rice and curry every day. No fever or chills. Weight at home 150-154 pounds. Taking all medications.   Past Medical History:  Diagnosis Date   AICD (automatic cardioverter/defibrillator) present    Aortic atherosclerosis    Atrial  fibrillation (HCC)    a.) CHA2DS2-VASc = 7 (age x 2, CHF, CVA x 2, aortic plaque, T2DM). b.) rate/rhythm maintained on oral amiodarone ; chronically anticoagulated on rivaroxaban . 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 b.) TTE 07/25/2021: EF 20-25%; global HK; G3DD; severely reduced RV function; Severe BAR; mod-severe MR, severe TR, mild AR.   HLD (hyperlipidemia)    HTN (hypertension)    Hypothyroidism    IDA (iron deficiency anemia)    Ischemic cardiomyopathy    a.) TTE 06/29/2017: EF 10-15%. b.) TTE 07/25/2021: ED 20-25%.   Left-sided cerebrovascular accident (CVA) (HCC) 2004   a.) residual weakness in LEFT hand   Long term current use of anticoagulant    a.) rivaroxaban    S/P CABG x 4 2003   [place]   T2DM (type 2 diabetes mellitus) (HCC)    Thrombocytopenia    possibly associated with underlying DIC    Current Outpatient Medications  Medication Sig Dispense Refill   acetaminophen  (TYLENOL ) 500 MG tablet Take 500-1,000 mg by mouth every 6 (six) hours as needed for  mild pain (pain score 1-3) or moderate pain (pain score 4-6).     amiodarone  (PACERONE ) 200 MG tablet Take 1 tablet (200 mg total) by mouth 2 (two) times daily. 60 tablet 11   atorvastatin  (LIPITOR ) 80 MG tablet TAKE 1 TABLET(80 MG) BY MOUTH DAILY 90 tablet 3   Calcium  Carbonate Antacid (TUMS PO) Take 1 tablet by mouth daily as needed (heartburn).     folic acid  (FOLVITE ) 1 MG tablet Take 1 tablet (1 mg total) by mouth every morning. 90 tablet 1   gabapentin  (NEURONTIN ) 100 MG  capsule Take 1 capsule (100 mg total) by mouth 3 (three) times daily. 270 capsule 0   levothyroxine  (SYNTHROID ) 88 MCG tablet Take 88 mcg by mouth daily before breakfast.     losartan  (COZAAR ) 25 MG tablet TAKE 1 TABLET(25 MG) BY MOUTH DAILY 90 tablet 3   meclizine  (ANTIVERT ) 25 MG tablet TAKE 1 TABLET(25 MG) BY MOUTH THREE TIMES DAILY AS NEEDED 135 tablet 0   metFORMIN (GLUCOPHAGE-XR) 500 MG 24 hr tablet Take 500 mg by mouth daily.     pantoprazole  (PROTONIX ) 20 MG tablet TAKE 1 TABLET BY MOUTH EVERY DAY 90 tablet 1   Polyethyl Glycol-Propyl Glycol (SYSTANE OP) Place 1 drop into both eyes daily.     potassium chloride  SA (KLOR-CON  M) 20 MEQ tablet Take 2 tablets (40 mEq total) by mouth daily. 60 tablet 3   torsemide  (DEMADEX ) 20 MG tablet Take 2 tablets (40 mg total) by mouth 2 (two) times daily. 120 tablet 3   TRADJENTA 5 MG TABS tablet Take 5 mg by mouth daily.     No current facility-administered medications for this encounter.    Allergies[1]    Social History   Socioeconomic History   Marital status: Widowed    Spouse name: Not on file   Number of children: Not on file   Years of education: Not on file   Highest education level: Not on file  Occupational History   Not on file  Tobacco Use   Smoking status: Former    Types: Cigarettes   Smokeless tobacco: Former    Types: Chew  Substance and Sexual Activity   Alcohol use: No   Drug use: No   Sexual activity: Not Currently  Other Topics Concern   Not on file  Social History Narrative   Lives with brother, POA   Social Drivers of Health   Tobacco Use: Medium Risk (09/27/2024)   Patient History    Smoking Tobacco Use: Former    Smokeless Tobacco Use: Former    Passive Exposure: Not on Stage Manager: Not on [employer] Insecurity: Not on file  Transportation Needs: Not on file  Physical Activity: Not on file  Stress: Not on file  Social Connections: Not on file  Intimate Partner Violence: Not on  file  Depression (PHQ2-9): Low Risk (08/19/2023)   Depression (PHQ2-9)    PHQ-2 Score: 0  Alcohol Screen: Not on file  Housing: Unknown (08/28/2024)   Received from [facility]   Epic    Unable to Pay for Housing in the Last Year: Not on file    Number of Times Moved in the Last Year: Not on file    At any time in the past 12 months, were you homeless or living in a shelter (including now)?: No  Utilities: Not on file  Health Literacy: Not on file   Family History  Problem Relation Age of Onset  Diabetes Brother    Hypertension Mother    Diabetes Sister    Vitals:   09/27/24 1331  BP: 101/62  Pulse: (!) 59  SpO2: 99%  Weight: 69.9 kg (154 lb)  Height: 5' 4 (1.626 m)    Filed Weights   09/27/24 1331  Weight: 69.9 kg (154 lb)   Wt Readings from Last 3 Encounters:  09/27/24 69.9 kg (154 lb)  09/25/24 70.3 kg (155 lb)  09/17/24 69.7 kg (153 lb 11.2 oz)     PHYSICAL EXAM: General:  chronically ill appearing.  No respiratory difficulty. Arrived in [facility] Neck: JVD to jaw.  Cor: Regular rate & rhythm. No murmurs. Lungs: clear, diminished bases Extremities: +2-3 BLE edema to posterior thigh Neuro: alert & oriented x 3. Affect pleasant.   Device interrogation:  Optivol significantly elevated, low thoracic impedance, VP 81.7%.   ASSESSMENT & PLAN: End-Stage Biventricular Heart Failure - ICM, high burden of RV pacing likely also contributing - Longstanding severely reduced LV dysfunction EF ~20% (most recently), images on TTE throughout the years have been poor. Severe RV dysfunction noted in 2018 with consistent remodeling on most recent echo. Now with increased RV pacing burden and decline in NYHA class, paced QRS is wide, could be contributing to worsening symptoms. He was previously on home hospice, but services were discontinued with plans for possible ICD>CRT-D upgrade, however cancelled due to AKI. Concerning for possible low output. - NYHA IV. Markedly  volume overloaded.   - Arrange for 80 mg IV lasix  at infusion center.  - Increase torsemide  to 60 BID. Increase daily KDUR to 60 mEq daily - Will also give him 2.5 mg metolazone  for 2 days to start tomorrow + 40 mEq KDUR - Will stop losartan , BP low normal. To allow for diuresis and renal perfusion  - off coreg  with concern for low output - GDMT limited by kidney function - Unable to get compression socks on, will order [facility] for UNNA boots. BLE weeping and starting to open.  - Labs today.  - not an advanced therapies candidate due to age, CKD, and RV failure. See GOC below  2. T2DM - per PCP  3. Atrial fibrillation - has single chamber AICD; now with increased RV pacing with wide QRS - Initially planned for CRTD upgrade, cancelled due to AKI. No longer a candidate.  - per Dr. Kennyth: unclear if permanent. Does not have atrial lead to discriminate - on amio 200 mg bid - previously on xarelto , stopped 12/22 d/t GIB. Blood thinner mgmt deferred to Dr. Deretha.  - Followed by EP  4. MR/TR - in the setting of volume overload  5. CAD - s/p CABG x4 ([place] in 2003) - continue atorva 80 mg daily  6. 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 GOC - previously on hospice; stopped due to pursuing procedural services - Continued goals of care discussions with brother. They understand that he has end-stage heart failure. Suspect low-output. Low likelihood we can optimize him enough for CRT-D upgrade which is not a goal anymore. Also doubt that this would improve his quality of life or life expectancy at this point. Today he remains massively volume overloaded. Diuretics adjusted to try to get as much fluid off of him to keep him comfortable. Family verbalized that this is their main goal as well as keeping him at home. His brother plans on getting him  reestablished with hospice later today vs tomorrow. Agree with this  plan.   Follow up next week to reassess volume status.   Beckey LITTIE Coe, NP 09/17/24     [1]  Allergies Allergen Reactions   Lorazepam  Shortness Of Breath and Other (See Comments)    Pt experienced adverse reaction and was transferred to ICU last time they were given Med Other reaction(s): Other (See Comments) Pt experienced adverse reaction and was transferred to ICU last time they were given Med   Porcine (Pork) Protein-Containing Drug Products Other (See Comments)    Cultural reasons   "

## 2024-09-24 NOTE — Progress Notes (Signed)
 " Cardiology Office Note:  .   Date:  09/24/2024  ID:  Doug Bride, DOB 1946/08/25, MRN [id number] PCP: Albina GORMAN Dine, MD  [facility] Providers Cardiologist:  Dr. Deretha Alter ([facility]) Electrophysiologist:  Fonda Kitty, MD {  History of Present Illness: .   Walter Blair is a 79 y.o. male w/PMHx of  HTN, HLD, CKD (III), stroke, GERD, DM [place] (IDA)/thrombocytopenia, hypothyroidism Bladder cancer (s/p surgery) CAD (CABG 2003 ICM, chronic systolic HF AFib (? permanent > not on a/c 2/2 GIB ?) VHD (MR)  Referred to EP via his primary cardiologist > worsening HF symptoms increasing RV pacing %. Planned for ICD upgrade to CRT though pre-op labs with AKI and procedure cancelled (NOTE: was on hospice at home >> cancelled given his planned pursuit of procedures)  He was referred to HF team for further management and optimization. Saw them 09/10/24, described class IIIb symptoms, poor appetite, easily winded, feeling poorly Described endstage CM w/concerns of low output, BiVe faiure Coreg  stopped > infusion center for IV lasix , torsemide  adjusted Not and advanced therapies candidate Did not think he would be able to be optimized enough/to the point of being able to proceed with upgrade. GOC discussion: Quality of quantity, discussed palliative inotropes. CRT not likely prolong/improve his QOL Planned for 1 week f/u  12/29/ AHF visit: weight down 10lbs, still SOB w/persistent edema, poor appetite, No orthopnea Stage IV symptoms, remained markedly volume OL Torsemide  adjusted, again > not procedural candidate Planned for QOL  Today's visit is scheduled to f/u post procedure cancellation ROS:   He comes today accompanied by his brother. The patient tells me he can hear and understand me, but defers the conversation to his brother.  He reports the weight/edema continue to wax/wane They see the HF clinic on Thursday and Dr. Deretha on Friday. After  the recent medications changes. The patient denies CP, palpitations No rest/seated SOB, + DOE No near syncope or syncope, no falls  In regards to device upgrade: Discussed my interpretation of AHF clinic notes, that at this stage of his HF/disease process, they did not think that they could get him optimized enough for a procedure, and perhaps even more importantly, that it would provide benefit.  At that point the procedure becomes rick > benefit and not recommended to pursue CRT  Battery est is 8 mo Discussed that generator change procedure in comparison to upgrdae procedure are much different and if when ERI is reached, and he still requires pacing support, a generator change would still be reasonable (perhaps PPM)  In regards to his AFib The patient's brother recalls that he had been on a blood thinner some years ago, stopped with blood in his urin, but had surgery for a bladder mass that was taken care of. Never placed back on a/c that he recalls. Discussed AFib and blood thinner role to reduce stroke risk w/AFib   Device information MDT single chamber ICD implanted 09/27/2002  Arrhythmia/AAD hx amiodarone   Studies Reviewed: SABRA    EKG not done today  Limited DEVICE remote dated 09/23/24 reviewed by myself Battery est is ~ 8 months auto lead measurements are good No HVR episodes Presenting rhythm is VS 90's OptiVol remains high  Risk Assessment/Calculations:    Physical Exam:   VS:  There were no vitals taken for this visit.   Wt Readings from Last 3 Encounters:  09/17/24 153 lb 11.2 oz (69.7 kg)  09/10/24 163 lb 3.2 oz (74 kg)  08/15/24  151 lb 6.4 oz (68.7 kg)    GEN: Well nourished, well developed in no acute distress NECK: No JVD; No carotid bruits CARDIAC: irreg-irreg, no murmurs, rubs, gallops RESPIRATORY:  CTA b/l without rales, wheezing or rhonchi  ABDOMEN: Soft, non-tender, non-distended EXTREMITIES: 2++ edema b/l LE  ASSESSMENT AND PLAN: .    ICD ~ 65mo to  ERI Off coreg  via AHF team (for concerns of low output) His presenting rhythm was VS 90's on his remote RV pacing burden likely to improve off BB  Discussed that HF team did not think he would benefit from CRT upgrade, and in d/w Dr. Kennyth, not felt to be a candidate at this juncture  Though he is linked up to our devic clinic with a successful transmission 09/23/24.  ~8 mo to ERI If he still has pacing needs/burden, would pursue gen change    AFib (Unclear burden, attending cardiologist > paroxysmal)  CHA2DS2Vasc is 8, not on a/c Amiodarone  looks chronic, recently dose increased 07/30/24 back in Afib  Discussed with the patient/his brother rational for blood thinner/stroke risk reduction in AFib They will continue follow up for his AFib, medication/rhythm management with Dr. Deretha (sees him Friday)   CAD ICM, chronic CHF Endstage BiVe failure Edema/volume status waxes/wanes by his brother's report OptiVol is high (has been) Significant LE edema Lungs are clear Sees AHF team in a couple days C/w AHF team  Secondary hypercoagulable state 2/2 AFib Anticoagulation decisions/recommendations deferred to his attending cardiologist as discussed/pt/family preference   Dispo: remotes as usual, in clinic with EP to keep an eye on his battery status in 5-21mo, sooner if needed  Signed, Charlies Macario Arthur, PA-C   "

## 2024-09-25 ENCOUNTER — Ambulatory Visit: Attending: Physician Assistant | Admitting: Physician Assistant

## 2024-09-25 VITALS — BP 110/54 | HR 98 | Ht 64.0 in | Wt 155.0 lb

## 2024-09-25 DIAGNOSIS — I255 Ischemic cardiomyopathy: Secondary | ICD-10-CM | POA: Diagnosis not present

## 2024-09-25 DIAGNOSIS — Z95810 Presence of automatic (implantable) cardiac defibrillator: Secondary | ICD-10-CM | POA: Insufficient documentation

## 2024-09-25 DIAGNOSIS — I4891 Unspecified atrial fibrillation: Secondary | ICD-10-CM | POA: Diagnosis not present

## 2024-09-25 NOTE — Patient Instructions (Addendum)
 Medication Instructions:   Your physician recommends that you continue on your current medications as directed. Please refer to the Current Medication list given to you today.    *If you need a refill on your cardiac medications before your next appointment, please call your pharmacy*  Lab Work: NONE ORDERED  TODAY   If you have labs (blood work) drawn today and your tests are completely normal, you will receive your results only by: MyChart Message (if you have MyChart) OR A paper copy in the mail If you have any lab test that is abnormal or we need to change your treatment, we will call you to review the results.  Testing/Procedures: NONE ORDERED  TODAY    Follow-Up: At [name], you and your health needs are our priority.  As part of our continuing mission to provide you with exceptional heart care, our providers are all part of one team.  This team includes your primary Cardiologist (physician) and Advanced Practice Providers or APPs (Physician Assistants and Nurse Practitioners) who all work together to provide you with the care you need, when you need it.  Your next appointment:   5 -6 month(s)  Provider:   Fonda Kitty, MD, Daphne Barrack, NP, Ozell Jodie Passey, PA-C, or Charlies Arthur, PA-C    We recommend signing up for the patient portal called MyChart.  Sign up information is provided on this After Visit Summary.  MyChart is used to connect with patients for Virtual Visits (Telemedicine).  Patients are able to view lab/test results, encounter notes, upcoming appointments, etc.  Non-urgent messages can be sent to your provider as well.   To learn more about what you can do with MyChart, go to [email or web address].   Other Instructions

## 2024-09-27 ENCOUNTER — Ambulatory Visit (HOSPITAL_COMMUNITY)
Admission: RE | Admit: 2024-09-27 | Discharge: 2024-09-27 | Disposition: A | Discharge status: Home / Self Care | Source: Ambulatory Visit | Attending: Internal Medicine | Admitting: Internal Medicine

## 2024-09-27 ENCOUNTER — Encounter (HOSPITAL_COMMUNITY): Payer: Self-pay

## 2024-09-27 ENCOUNTER — Ambulatory Visit (HOSPITAL_COMMUNITY): Payer: Self-pay | Admitting: Internal Medicine

## 2024-09-27 VITALS — BP 101/62 | HR 59 | Ht 64.0 in | Wt 154.0 lb

## 2024-09-27 DIAGNOSIS — Z95810 Presence of automatic (implantable) cardiac defibrillator: Secondary | ICD-10-CM | POA: Insufficient documentation

## 2024-09-27 DIAGNOSIS — I130 Hypertensive heart and chronic kidney disease with heart failure and stage 1 through stage 4 chronic kidney disease, or unspecified chronic kidney disease: Secondary | ICD-10-CM | POA: Insufficient documentation

## 2024-09-27 DIAGNOSIS — Z7189 Other specified counseling: Secondary | ICD-10-CM | POA: Diagnosis not present

## 2024-09-27 DIAGNOSIS — I5022 Chronic systolic (congestive) heart failure: Secondary | ICD-10-CM | POA: Insufficient documentation

## 2024-09-27 DIAGNOSIS — Z79899 Other long term (current) drug therapy: Secondary | ICD-10-CM | POA: Diagnosis not present

## 2024-09-27 DIAGNOSIS — N184 Chronic kidney disease, stage 4 (severe): Secondary | ICD-10-CM | POA: Diagnosis not present

## 2024-09-27 DIAGNOSIS — I5082 Biventricular heart failure: Secondary | ICD-10-CM | POA: Insufficient documentation

## 2024-09-27 DIAGNOSIS — I5084 End stage heart failure: Secondary | ICD-10-CM | POA: Insufficient documentation

## 2024-09-27 DIAGNOSIS — I081 Rheumatic disorders of both mitral and tricuspid valves: Secondary | ICD-10-CM | POA: Insufficient documentation

## 2024-09-27 DIAGNOSIS — I255 Ischemic cardiomyopathy: Secondary | ICD-10-CM | POA: Insufficient documentation

## 2024-09-27 DIAGNOSIS — I340 Nonrheumatic mitral (valve) insufficiency: Secondary | ICD-10-CM

## 2024-09-27 DIAGNOSIS — Z79890 Hormone replacement therapy (postmenopausal): Secondary | ICD-10-CM | POA: Diagnosis not present

## 2024-09-27 DIAGNOSIS — I2510 Atherosclerotic heart disease of native coronary artery without angina pectoris: Secondary | ICD-10-CM | POA: Insufficient documentation

## 2024-09-27 DIAGNOSIS — Z87891 Personal history of nicotine dependence: Secondary | ICD-10-CM | POA: Insufficient documentation

## 2024-09-27 DIAGNOSIS — L97919 Non-pressure chronic ulcer of unspecified part of right lower leg with unspecified severity: Secondary | ICD-10-CM

## 2024-09-27 DIAGNOSIS — Z7984 Long term (current) use of oral hypoglycemic drugs: Secondary | ICD-10-CM | POA: Insufficient documentation

## 2024-09-27 DIAGNOSIS — E1122 Type 2 diabetes mellitus with diabetic chronic kidney disease: Secondary | ICD-10-CM | POA: Diagnosis not present

## 2024-09-27 DIAGNOSIS — I482 Chronic atrial fibrillation, unspecified: Secondary | ICD-10-CM

## 2024-09-27 DIAGNOSIS — Z8551 Personal history of malignant neoplasm of bladder: Secondary | ICD-10-CM | POA: Insufficient documentation

## 2024-09-27 DIAGNOSIS — D696 Thrombocytopenia, unspecified: Secondary | ICD-10-CM | POA: Diagnosis not present

## 2024-09-27 DIAGNOSIS — L97909 Non-pressure chronic ulcer of unspecified part of unspecified lower leg with unspecified severity: Secondary | ICD-10-CM | POA: Insufficient documentation

## 2024-09-27 DIAGNOSIS — K219 Gastro-esophageal reflux disease without esophagitis: Secondary | ICD-10-CM | POA: Diagnosis not present

## 2024-09-27 DIAGNOSIS — E785 Hyperlipidemia, unspecified: Secondary | ICD-10-CM | POA: Insufficient documentation

## 2024-09-27 DIAGNOSIS — Z951 Presence of aortocoronary bypass graft: Secondary | ICD-10-CM | POA: Insufficient documentation

## 2024-09-27 DIAGNOSIS — E079 Disorder of thyroid, unspecified: Secondary | ICD-10-CM | POA: Insufficient documentation

## 2024-09-27 DIAGNOSIS — D631 Anemia in chronic kidney disease: Secondary | ICD-10-CM | POA: Insufficient documentation

## 2024-09-27 DIAGNOSIS — Z8673 Personal history of transient ischemic attack (TIA), and cerebral infarction without residual deficits: Secondary | ICD-10-CM | POA: Insufficient documentation

## 2024-09-27 DIAGNOSIS — I480 Paroxysmal atrial fibrillation: Secondary | ICD-10-CM | POA: Insufficient documentation

## 2024-09-27 LAB — PRO BRAIN NATRIURETIC PEPTIDE: Pro Brain Natriuretic Peptide: 6422 pg/mL — ABNORMAL HIGH

## 2024-09-27 LAB — BASIC METABOLIC PANEL WITH GFR
Anion gap: 11 (ref 5–15)
BUN: 35 mg/dL — ABNORMAL HIGH (ref 8–23)
CO2: 26 mmol/L (ref 22–32)
Calcium: 9.2 mg/dL (ref 8.9–10.3)
Chloride: 100 mmol/L (ref 98–111)
Creatinine, Ser: 2.6 mg/dL — ABNORMAL HIGH (ref 0.61–1.24)
GFR, Estimated: 24 mL/min — ABNORMAL LOW
Glucose, Bld: 161 mg/dL — ABNORMAL HIGH (ref 70–99)
Potassium: 5 mmol/L (ref 3.5–5.1)
Sodium: 137 mmol/L (ref 135–145)

## 2024-09-27 MED ORDER — FUROSEMIDE 10 MG/ML IJ SOLN
80.0000 mg | Freq: Once | INTRAMUSCULAR | Status: AC
  Administered 2024-09-27: 80 mg via INTRAVENOUS

## 2024-09-27 MED ORDER — POTASSIUM CHLORIDE CRYS ER 20 MEQ PO TBCR: 60.0000 meq | EXTENDED_RELEASE_TABLET | Freq: Every day | ORAL | 3 refills | Status: DC

## 2024-09-27 MED ORDER — FUROSEMIDE 10 MG/ML IJ SOLN
INTRAMUSCULAR | Status: AC
  Filled 2024-09-27: qty 8

## 2024-09-27 MED ORDER — METOLAZONE 2.5 MG PO TABS: 2.5000 mg | ORAL_TABLET | ORAL | 0 refills | Status: DC

## 2024-09-27 MED ORDER — TORSEMIDE 20 MG PO TABS: 60.0000 mg | ORAL_TABLET | Freq: Two times a day (BID) | ORAL | 3 refills | Status: DC

## 2024-09-27 NOTE — Patient Instructions (Signed)
 STOP Losartan .  CHANGE Torsemide  to 60 mg Twice daily  CHANGE Potassium to 60 mEq Daily.  TAKE 2.5 MG OF METOLAZONE  TODAY AND TOMORROW WITH AN EXTRA 40 mEq OF POTASSIUM.  Labs done today, your results will be available in MyChart, we will contact you for abnormal readings.  Your physician recommends that you schedule a follow-up appointment in: 1 WEEK.  If you have any questions or concerns before your next appointment please send us  a message through [place] or call our office at [phone].    TO LEAVE A MESSAGE FOR THE NURSE SELECT OPTION 2, PLEASE LEAVE A MESSAGE INCLUDING: YOUR NAME DATE OF BIRTH CALL BACK NUMBER REASON FOR CALL**this is important as we prioritize the call backs  YOU WILL RECEIVE A CALL BACK THE SAME DAY AS LONG AS YOU CALL BEFORE 4:00 PM  At the [facility], you and your health needs are our priority. As part of our continuing mission to provide you with exceptional heart care, we have created designated Provider Care Teams. These Care Teams include your primary Cardiologist (physician) and Advanced Practice Providers (APPs- Physician Assistants and Nurse Practitioners) who all work together to provide you with the care you need, when you need it.   You may see any of the following providers on your designated Care Team at your next follow up: Dr Toribio Fuel Dr Ezra Shuck Dr. Morene Brownie Greig Mosses, NP Caffie Shed, [place] [facility] [place], [place] Beckey Coe, NP Jordan Lee, NP Ellouise Class, NP Tinnie Redman, PharmD Jaun Bash, PharmD   Please be sure to bring in all your medications bottles to every appointment.    Thank you for choosing [facility]

## 2024-09-28 ENCOUNTER — Ambulatory Visit: Admitting: Cardiovascular Disease

## 2024-09-28 ENCOUNTER — Encounter: Payer: Self-pay | Admitting: Cardiovascular Disease

## 2024-09-28 ENCOUNTER — Telehealth: Payer: Self-pay

## 2024-09-28 VITALS — BP 122/68 | HR 80 | Ht 64.0 in | Wt 154.0 lb

## 2024-09-28 DIAGNOSIS — I255 Ischemic cardiomyopathy: Secondary | ICD-10-CM | POA: Diagnosis not present

## 2024-09-28 DIAGNOSIS — I480 Paroxysmal atrial fibrillation: Secondary | ICD-10-CM

## 2024-09-28 DIAGNOSIS — I5022 Chronic systolic (congestive) heart failure: Secondary | ICD-10-CM

## 2024-09-28 DIAGNOSIS — I340 Nonrheumatic mitral (valve) insufficiency: Secondary | ICD-10-CM | POA: Diagnosis not present

## 2024-09-28 DIAGNOSIS — R0602 Shortness of breath: Secondary | ICD-10-CM

## 2024-09-28 DIAGNOSIS — Z95810 Presence of automatic (implantable) cardiac defibrillator: Secondary | ICD-10-CM | POA: Diagnosis not present

## 2024-09-28 DIAGNOSIS — I351 Nonrheumatic aortic (valve) insufficiency: Secondary | ICD-10-CM | POA: Diagnosis not present

## 2024-09-28 DIAGNOSIS — Z0130 Encounter for examination of blood pressure without abnormal findings: Secondary | ICD-10-CM

## 2024-10-01 NOTE — Telephone Encounter (Signed)
 Patient passed before being able to address this

## 2024-10-02 ENCOUNTER — Telehealth: Payer: Self-pay

## 2024-10-02 ENCOUNTER — Ambulatory Visit (HOSPITAL_COMMUNITY)

## 2024-10-02 NOTE — Telephone Encounter (Signed)
 Spoke with Graig at [facility] she is changing it over to Dr TejanSie and he will complete

## 2024-10-02 NOTE — Telephone Encounter (Signed)
 Dee with [facility] called asking for you to log into [facility] and sign the death certificate for the patient he passed on 10/13/2024 evening

## 2024-10-15 ENCOUNTER — Ambulatory Visit: Admitting: Cardiovascular Disease

## 2024-10-21 NOTE — Telephone Encounter (Signed)
 Elenor with [facility] called stating that the patient is being evaluate for Hospice Admission tomorrow at 11A they would like for you to be his attending physician and need to know that you agree he has 6 months or less to live

## 2024-10-21 NOTE — Progress Notes (Signed)
"     Cardiology Office Note   Date:  10/12/2024   ID:  Walter, Blair [birth year], MRN [id number]  PCP:  Albina GORMAN Dine, MD  Cardiologist:  Denyse Bathe, MD      History of Present Illness: Walter Blair is a 79 y.o. male who presents for  Chief Complaint  Patient presents with   Follow-up    2 months follow up    Biventricular ICD was turned down due to decompensated CHF. Creat 2.82, ad losatan was held. On torsemide  60 mg BID. Started on metalzone also.      Past Medical History:  Diagnosis Date   AICD (automatic cardioverter/defibrillator) present    Aortic atherosclerosis    Atrial fibrillation (HCC)    a.) CHA2DS2-VASc = 7 (age x 2, CHF, CVA x 2, aortic plaque, T2DM). b.) rate/rhythm maintained on oral amiodarone ; chronically anticoagulated on rivaroxaban . 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 b.) TTE 07/25/2021: EF 20-25%; global HK; G3DD; severely reduced RV function; Severe BAR; mod-severe MR, severe TR, mild AR.   HLD (hyperlipidemia)    HTN (hypertension)    Hypothyroidism    IDA (iron deficiency anemia)    Ischemic cardiomyopathy    a.) TTE 06/29/2017: EF 10-15%. b.) TTE 07/25/2021: ED 20-25%.   Left-sided cerebrovascular accident (CVA) (HCC) 2004   a.) residual weakness in LEFT hand   Long term current use of anticoagulant    a.) rivaroxaban    S/P CABG x 4 2003   Performed in [place]   T2DM (type 2 diabetes mellitus) (HCC)    Thrombocytopenia    possibly associated with underlying DIC     Past Surgical History:   Procedure Laterality Date   BLADDER INSTILLATION N/A 08/07/2021   Procedure: BLADDER INSTILLATION OF GEMCITABINE ;  Surgeon: [name], MD;  Location: [facility];  Service: Urology;  Laterality: N/A;   CARDIAC DEFIBRILLATOR PLACEMENT     CARDIOVERSION N/A 07/01/2017   Procedure: CARDIOVERSION;  Surgeon: Bathe Denyse LABOR, MD;  Location: [facility];  Service: Cardiovascular;  Laterality: N/A;   CARDIOVERSION N/A 07/05/2017   Procedure: CARDIOVERSION;  Surgeon: Bathe Denyse LABOR, MD;  Location: [facility];  Service: Cardiovascular;  Laterality: N/A;   COLONOSCOPY WITH PROPOFOL  N/A 12/30/2020   Procedure: COLONOSCOPY WITH PROPOFOL ;  Surgeon: [name], MD;  Location: [facility] ENDOSCOPY;  Service: Gastroenterology;  Laterality: N/A;   CORONARY ARTERY BYPASS GRAFT N/A 2003   Procedure: Four-vessel CABG performed in [place]   CYSTOSCOPY W/ RETROGRADES Bilateral 08/07/2021   Procedure: CYSTOSCOPY WITH RETROGRADE PYELOGRAM;  Surgeon: [name], MD;  Location: [facility];  Service: Urology;  Laterality: Bilateral;   ESOPHAGOGASTRODUODENOSCOPY  12/30/2020   Procedure: ESOPHAGOGASTRODUODENOSCOPY (EGD);  Surgeon: [name], MD;  Location: [facility] ENDOSCOPY;  Service: Gastroenterology;;   ESOPHAGOGASTRODUODENOSCOPY N/A 09/17/2021   Procedure: ESOPHAGOGASTRODUODENOSCOPY (EGD);  Surgeon: [name], MD;  Location: [facility] ENDOSCOPY;  Service: Endoscopy;  Laterality: N/A;   GIVENS CAPSULE STUDY N/A 02/02/2021   Procedure: GIVENS CAPSULE STUDY;  Surgeon: Therisa,  Ruel, MD;  Location: [facility] ENDOSCOPY;  Service: Gastroenterology;  Laterality: N/A;  WILL NEED INTERPRETER ON WHEELS;  BROTHER WANTS TO TRANSLATE   TEE WITHOUT CARDIOVERSION N/A 07/01/2017   Procedure: TRANSESOPHAGEAL ECHOCARDIOGRAM (TEE);  Surgeon: [name], MD;  Location: [facility];  Service: Cardiovascular;  Laterality: N/A;   TEE WITHOUT CARDIOVERSION N/A 07/05/2017   Procedure: TRANSESOPHAGEAL ECHOCARDIOGRAM (TEE);  Surgeon: [name], MD;  Location: [facility];  Service: Cardiovascular;  Laterality: N/A;   TRANSURETHRAL RESECTION OF BLADDER TUMOR N/A 08/07/2021   Procedure: TRANSURETHRAL RESECTION OF BLADDER TUMOR (TURBT);  Surgeon: [name], MD;  Location: [facility];  Service: Urology;  Laterality: N/A;   URETEROSCOPY Left 08/07/2021   Procedure: DIAGNOSTIC URETEROSCOPY;  Surgeon: [name], MD;  Location: [facility];  Service: Urology;  Laterality: Left;     Current Outpatient Medications  Medication Sig Dispense Refill   acetaminophen  (TYLENOL ) 500 MG tablet Take 500-1,000 mg by mouth every 6 (six) hours as needed for mild pain (pain score 1-3) or moderate pain (pain score 4-6).     amiodarone  (PACERONE ) 200 MG tablet Take 1 tablet (200 mg total) by mouth 2 (two) times daily. 60 tablet 11   atorvastatin  (LIPITOR ) 80 MG tablet TAKE 1 TABLET(80 MG) BY MOUTH DAILY 90 tablet 3   folic acid  (FOLVITE ) 1 MG tablet Take 1 tablet (1 mg total) by mouth every morning. 90 tablet 1   gabapentin  (NEURONTIN ) 100 MG capsule Take 1 capsule (100 mg total) by mouth 3 (three) times daily. 270 capsule 0   levothyroxine  (SYNTHROID ) 88 MCG tablet Take 88 mcg by mouth daily before breakfast.     meclizine  (ANTIVERT ) 25 MG tablet TAKE 1 TABLET(25 MG) BY MOUTH THREE TIMES DAILY AS NEEDED 135 tablet 0   metFORMIN (GLUCOPHAGE-XR) 500 MG 24 hr tablet Take 500 mg by mouth daily.     metolazone  (ZAROXOLYN ) 2.5 MG tablet Take 1 tablet (2.5 mg total) by mouth as directed. As directed by the [facility] 5 tablet 0   pantoprazole  (PROTONIX ) 20 MG tablet TAKE 1 TABLET BY MOUTH EVERY DAY 90 tablet 1   Polyethyl Glycol-Propyl Glycol (SYSTANE OP) Place 1 drop into both eyes daily.     potassium chloride  SA (KLOR-CON  M) 20 MEQ tablet Take 3 tablets (60 mEq total) by mouth daily. 60 tablet 3   torsemide  (DEMADEX ) 20 MG tablet Take 3 tablets (60 mg total) by mouth 2 (two) times daily. 120 tablet 3   TRADJENTA 5 MG TABS  tablet Take 5 mg by mouth daily.     Calcium  Carbonate Antacid (TUMS PO) Take 1 tablet by mouth daily as needed (heartburn).     No current facility-administered medications for this visit.    Allergies:   Lorazepam  and Porcine (pork) protein-containing drug products    Social History:   reports that he has quit smoking. His smoking use included cigarettes. He has quit using smokeless tobacco.  His smokeless tobacco use included chew. He reports that he does not drink alcohol and does not use drugs.   Family History:  family history includes Diabetes in his brother and sister; Hypertension in his mother.    ROS:     Review of Systems  Constitutional: Negative.   HENT: Negative.    Eyes: Negative.   Respiratory: Negative.    Gastrointestinal: Negative.   Genitourinary: Negative.   Musculoskeletal: Negative.   Skin: Negative.   Neurological: Negative.   Endo/Heme/Allergies: Negative.  Psychiatric/Behavioral: Negative.    All other systems reviewed and are negative.     All other systems are reviewed and negative.    PHYSICAL EXAM: VS:  BP 122/68   Pulse 80   Ht 5' 4 (1.626 m)   Wt 154 lb (69.9 kg)   SpO2 93%   BMI 26.43 kg/m  , BMI Body mass index is 26.43 kg/m. Last weight:  Wt Readings from Last 3 Encounters:  10/08/2024 154 lb (69.9 kg)  09/27/24 154 lb (69.9 kg)  09/25/24 155 lb (70.3 kg)     Physical Exam Vitals reviewed.  Constitutional:      Appearance: Normal appearance. He is normal weight.  HENT:     Head: Normocephalic.     Nose: Nose normal.     Mouth/Throat:     Mouth: Mucous membranes are moist.  Eyes:     Pupils: Pupils are equal, round, and reactive to light.  Cardiovascular:     Rate and Rhythm: Normal rate and regular rhythm.     Pulses: Normal pulses.     Heart sounds: Normal heart sounds.  Pulmonary:     Effort: Pulmonary effort is normal.  Abdominal:     General: Abdomen is flat. Bowel sounds are normal.  Musculoskeletal:         General: Normal range of motion.     Cervical back: Normal range of motion.  Skin:    General: Skin is warm.  Neurological:     General: No focal deficit present.     Mental Status: He is alert.  Psychiatric:        Mood and Affect: Mood normal.       EKG:   Recent Labs: 09/10/2024: Hemoglobin 9.2; Magnesium  2.0; Platelets 150 09/17/2024: ALT 68 09/27/2024: BUN 35; Creatinine, Ser 2.60; Potassium 5.0; Pro Brain Natriuretic Peptide 6,422.0; Sodium 137    Lipid Panel No results found for: CHOL, TRIG, HDL, CHOLHDL, VLDL, LDLCALC, LDLDIRECT    Other studies Reviewed: Additional studies/ records that were reviewed today include:  Review of the above records demonstrates:       No data to display            ASSESSMENT AND PLAN:    ICD-10-CM   1. Nonrheumatic aortic valve insufficiency  I35.1     2. Nonrheumatic mitral valve regurgitation  I34.0     3. SOB (shortness of breath)  R06.02     4. Ischemic cardiomyopathy  I25.5     5. Chronic systolic congestive heart failure (HCC)  I50.22    CHF clinic consideing Milranone.    6. AICD (automatic cardioverter/defibrillator) present  Z95.810     7. PAF (paroxysmal atrial fibrillation) (HCC)  I48.0        Problem List Items Addressed This Visit       Cardiovascular and Mediastinum   PAF (paroxysmal atrial fibrillation) (HCC)   Ischemic cardiomyopathy   Chronic systolic congestive heart failure (HCC)     Other   AICD (automatic cardioverter/defibrillator) present   Other Visit Diagnoses       Nonrheumatic aortic valve insufficiency    -  Primary     Nonrheumatic mitral valve regurgitation         SOB (shortness of breath)              Disposition:   Return in about 2 weeks (around 10/12/2024).    Total time spent: 40 minutes  Signed,  Denyse Bathe, MD  10/08/2024 10:09  AM    [facility] "

## 2024-10-21 DEATH — deceased

## 2024-10-26 ENCOUNTER — Ambulatory Visit: Admitting: Internal Medicine

## 2024-11-22 ENCOUNTER — Ambulatory Visit: Admitting: Physician Assistant

## 2024-12-07 ENCOUNTER — Ambulatory Visit

## 2025-03-08 ENCOUNTER — Ambulatory Visit

## 2025-06-07 ENCOUNTER — Ambulatory Visit

## 2025-09-06 ENCOUNTER — Ambulatory Visit

## 2025-12-06 ENCOUNTER — Ambulatory Visit
